# Patient Record
Sex: Female | Born: 1935 | Race: Black or African American | Hispanic: No | Smoking: Never smoker
Health system: Southern US, Community
[De-identification: ages and names within clinical notes are randomized; demographics above are authoritative.]

## PROBLEM LIST (undated history)

## (undated) DIAGNOSIS — D649 Anemia, unspecified: Secondary | ICD-10-CM

## (undated) DIAGNOSIS — E785 Hyperlipidemia, unspecified: Secondary | ICD-10-CM

## (undated) DIAGNOSIS — K449 Diaphragmatic hernia without obstruction or gangrene: Secondary | ICD-10-CM

## (undated) DIAGNOSIS — D3A092 Benign carcinoid tumor of the stomach: Secondary | ICD-10-CM

## (undated) DIAGNOSIS — K5790 Diverticulosis of intestine, part unspecified, without perforation or abscess without bleeding: Secondary | ICD-10-CM

## (undated) DIAGNOSIS — D510 Vitamin B12 deficiency anemia due to intrinsic factor deficiency: Secondary | ICD-10-CM

## (undated) DIAGNOSIS — S62102A Fracture of unspecified carpal bone, left wrist, initial encounter for closed fracture: Secondary | ICD-10-CM

## (undated) DIAGNOSIS — R413 Other amnesia: Secondary | ICD-10-CM

## (undated) DIAGNOSIS — K219 Gastro-esophageal reflux disease without esophagitis: Secondary | ICD-10-CM

## (undated) DIAGNOSIS — I10 Essential (primary) hypertension: Secondary | ICD-10-CM

## (undated) DIAGNOSIS — IMO0002 Reserved for concepts with insufficient information to code with codable children: Secondary | ICD-10-CM

## (undated) HISTORY — DX: Anemia, unspecified: D64.9

## (undated) HISTORY — DX: Essential (primary) hypertension: I10

## (undated) HISTORY — PX: EUS: SHX5427

## (undated) HISTORY — PX: UPPER GASTROINTESTINAL ENDOSCOPY: SHX188

## (undated) HISTORY — PX: COLONOSCOPY: SHX174

## (undated) HISTORY — DX: Diverticulosis of intestine, part unspecified, without perforation or abscess without bleeding: K57.90

## (undated) HISTORY — DX: Gastro-esophageal reflux disease without esophagitis: K21.9

## (undated) HISTORY — DX: Reserved for concepts with insufficient information to code with codable children: IMO0002

## (undated) HISTORY — DX: Other amnesia: R41.3

## (undated) HISTORY — DX: Benign carcinoid tumor of the stomach: D3A.092

## (undated) HISTORY — DX: Diaphragmatic hernia without obstruction or gangrene: K44.9

## (undated) HISTORY — DX: Hyperlipidemia, unspecified: E78.5

## (undated) HISTORY — DX: Fracture of unspecified carpal bone, left wrist, initial encounter for closed fracture: S62.102A

## (undated) HISTORY — DX: Vitamin B12 deficiency anemia due to intrinsic factor deficiency: D51.0

---

## 2000-07-14 ENCOUNTER — Ambulatory Visit (HOSPITAL_COMMUNITY): Admission: RE | Admit: 2000-07-14 | Discharge: 2000-07-14 | Payer: Self-pay | Admitting: Family Medicine

## 2000-07-14 ENCOUNTER — Encounter: Payer: Self-pay | Admitting: Family Medicine

## 2000-07-23 ENCOUNTER — Other Ambulatory Visit: Admission: RE | Admit: 2000-07-23 | Discharge: 2000-07-23 | Payer: Self-pay | Admitting: Family Medicine

## 2000-08-01 ENCOUNTER — Other Ambulatory Visit: Admission: RE | Admit: 2000-08-01 | Discharge: 2000-08-01 | Payer: Self-pay | Admitting: Obstetrics and Gynecology

## 2001-06-26 ENCOUNTER — Ambulatory Visit (HOSPITAL_COMMUNITY): Admission: RE | Admit: 2001-06-26 | Discharge: 2001-06-26 | Payer: Self-pay | Admitting: Family Medicine

## 2001-07-14 ENCOUNTER — Encounter: Payer: Self-pay | Admitting: Family Medicine

## 2001-07-14 ENCOUNTER — Ambulatory Visit (HOSPITAL_COMMUNITY): Admission: RE | Admit: 2001-07-14 | Discharge: 2001-07-14 | Payer: Self-pay | Admitting: Family Medicine

## 2001-11-18 ENCOUNTER — Encounter: Payer: Self-pay | Admitting: *Deleted

## 2001-11-18 ENCOUNTER — Emergency Department (HOSPITAL_COMMUNITY): Admission: EM | Admit: 2001-11-18 | Discharge: 2001-11-18 | Payer: Self-pay | Admitting: *Deleted

## 2001-11-25 ENCOUNTER — Ambulatory Visit (HOSPITAL_COMMUNITY): Admission: RE | Admit: 2001-11-25 | Discharge: 2001-11-25 | Payer: Self-pay | Admitting: General Surgery

## 2001-12-09 ENCOUNTER — Ambulatory Visit (HOSPITAL_COMMUNITY): Admission: RE | Admit: 2001-12-09 | Discharge: 2001-12-10 | Payer: Self-pay | Admitting: Family Medicine

## 2002-07-15 ENCOUNTER — Encounter: Payer: Self-pay | Admitting: Family Medicine

## 2002-07-15 ENCOUNTER — Ambulatory Visit (HOSPITAL_COMMUNITY): Admission: RE | Admit: 2002-07-15 | Discharge: 2002-07-15 | Payer: Self-pay | Admitting: Family Medicine

## 2002-07-27 ENCOUNTER — Ambulatory Visit (HOSPITAL_COMMUNITY): Admission: RE | Admit: 2002-07-27 | Discharge: 2002-07-28 | Payer: Self-pay | Admitting: Family Medicine

## 2002-09-19 ENCOUNTER — Encounter: Payer: Self-pay | Admitting: Emergency Medicine

## 2002-09-19 ENCOUNTER — Emergency Department (HOSPITAL_COMMUNITY): Admission: EM | Admit: 2002-09-19 | Discharge: 2002-09-19 | Payer: Self-pay | Admitting: Emergency Medicine

## 2003-07-21 ENCOUNTER — Ambulatory Visit (HOSPITAL_COMMUNITY): Admission: RE | Admit: 2003-07-21 | Discharge: 2003-07-21 | Payer: Self-pay | Admitting: Family Medicine

## 2004-02-13 ENCOUNTER — Emergency Department (HOSPITAL_COMMUNITY): Admission: EM | Admit: 2004-02-13 | Discharge: 2004-02-13 | Payer: Self-pay | Admitting: Emergency Medicine

## 2004-02-27 ENCOUNTER — Ambulatory Visit (HOSPITAL_COMMUNITY): Admission: RE | Admit: 2004-02-27 | Discharge: 2004-02-28 | Payer: Self-pay | Admitting: Family Medicine

## 2004-03-01 ENCOUNTER — Ambulatory Visit: Payer: Self-pay | Admitting: *Deleted

## 2004-07-23 ENCOUNTER — Ambulatory Visit (HOSPITAL_COMMUNITY): Admission: RE | Admit: 2004-07-23 | Discharge: 2004-07-23 | Payer: Self-pay | Admitting: Family Medicine

## 2005-07-29 ENCOUNTER — Ambulatory Visit (HOSPITAL_COMMUNITY): Admission: RE | Admit: 2005-07-29 | Discharge: 2005-07-29 | Payer: Self-pay | Admitting: Family Medicine

## 2006-01-21 DIAGNOSIS — D3A092 Benign carcinoid tumor of the stomach: Secondary | ICD-10-CM

## 2006-01-21 DIAGNOSIS — S62102A Fracture of unspecified carpal bone, left wrist, initial encounter for closed fracture: Secondary | ICD-10-CM

## 2006-01-21 HISTORY — DX: Benign carcinoid tumor of the stomach: D3A.092

## 2006-01-21 HISTORY — DX: Fracture of unspecified carpal bone, left wrist, initial encounter for closed fracture: S62.102A

## 2006-04-30 ENCOUNTER — Inpatient Hospital Stay (HOSPITAL_COMMUNITY): Admission: EM | Admit: 2006-04-30 | Discharge: 2006-05-04 | Payer: Self-pay | Admitting: Emergency Medicine

## 2006-05-01 ENCOUNTER — Ambulatory Visit: Payer: Self-pay | Admitting: Gastroenterology

## 2006-05-02 ENCOUNTER — Encounter (INDEPENDENT_AMBULATORY_CARE_PROVIDER_SITE_OTHER): Payer: Self-pay | Admitting: Specialist

## 2006-05-06 ENCOUNTER — Ambulatory Visit: Payer: Self-pay | Admitting: Internal Medicine

## 2006-05-21 ENCOUNTER — Ambulatory Visit (HOSPITAL_COMMUNITY): Admission: RE | Admit: 2006-05-21 | Discharge: 2006-05-21 | Payer: Self-pay | Admitting: Gastroenterology

## 2006-05-21 ENCOUNTER — Ambulatory Visit: Payer: Self-pay | Admitting: Gastroenterology

## 2006-05-21 ENCOUNTER — Encounter (INDEPENDENT_AMBULATORY_CARE_PROVIDER_SITE_OTHER): Payer: Self-pay | Admitting: Specialist

## 2006-07-05 ENCOUNTER — Emergency Department (HOSPITAL_COMMUNITY): Admission: EM | Admit: 2006-07-05 | Discharge: 2006-07-05 | Payer: Self-pay | Admitting: Emergency Medicine

## 2006-07-09 ENCOUNTER — Ambulatory Visit: Payer: Self-pay | Admitting: Orthopedic Surgery

## 2006-08-11 ENCOUNTER — Ambulatory Visit (HOSPITAL_COMMUNITY): Admission: RE | Admit: 2006-08-11 | Discharge: 2006-08-11 | Payer: Self-pay | Admitting: Family Medicine

## 2006-08-13 ENCOUNTER — Ambulatory Visit (HOSPITAL_COMMUNITY): Payer: Self-pay | Admitting: Oncology

## 2006-08-20 ENCOUNTER — Ambulatory Visit (HOSPITAL_COMMUNITY): Admission: RE | Admit: 2006-08-20 | Discharge: 2006-08-20 | Payer: Self-pay | Admitting: Family Medicine

## 2006-08-21 ENCOUNTER — Ambulatory Visit (HOSPITAL_COMMUNITY): Admission: RE | Admit: 2006-08-21 | Discharge: 2006-08-21 | Payer: Self-pay | Admitting: Gastroenterology

## 2006-08-21 ENCOUNTER — Encounter: Payer: Self-pay | Admitting: Gastroenterology

## 2006-08-22 ENCOUNTER — Encounter (INDEPENDENT_AMBULATORY_CARE_PROVIDER_SITE_OTHER): Payer: Self-pay | Admitting: Radiology

## 2006-08-22 ENCOUNTER — Encounter: Admission: RE | Admit: 2006-08-22 | Discharge: 2006-08-22 | Payer: Self-pay | Admitting: Family Medicine

## 2006-08-25 ENCOUNTER — Ambulatory Visit: Payer: Self-pay | Admitting: Gastroenterology

## 2006-08-27 ENCOUNTER — Ambulatory Visit: Payer: Self-pay | Admitting: Orthopedic Surgery

## 2006-09-04 ENCOUNTER — Encounter (INDEPENDENT_AMBULATORY_CARE_PROVIDER_SITE_OTHER): Payer: Self-pay | Admitting: Radiology

## 2006-09-04 ENCOUNTER — Encounter: Admission: RE | Admit: 2006-09-04 | Discharge: 2006-09-04 | Payer: Self-pay | Admitting: Family Medicine

## 2006-10-13 ENCOUNTER — Emergency Department (HOSPITAL_COMMUNITY): Admission: EM | Admit: 2006-10-13 | Discharge: 2006-10-13 | Payer: Self-pay | Admitting: Emergency Medicine

## 2006-10-20 ENCOUNTER — Ambulatory Visit (HOSPITAL_COMMUNITY): Admission: RE | Admit: 2006-10-20 | Discharge: 2006-10-21 | Payer: Self-pay | Admitting: Family Medicine

## 2006-10-24 ENCOUNTER — Ambulatory Visit: Payer: Self-pay | Admitting: Cardiology

## 2006-10-24 ENCOUNTER — Encounter (HOSPITAL_COMMUNITY): Admission: RE | Admit: 2006-10-24 | Discharge: 2006-11-23 | Payer: Self-pay | Admitting: Oncology

## 2006-10-24 ENCOUNTER — Ambulatory Visit (HOSPITAL_COMMUNITY): Payer: Self-pay | Admitting: Oncology

## 2006-12-26 ENCOUNTER — Ambulatory Visit (HOSPITAL_COMMUNITY): Payer: Self-pay | Admitting: Oncology

## 2007-01-22 HISTORY — PX: EUS: SHX5427

## 2007-01-23 ENCOUNTER — Encounter (HOSPITAL_COMMUNITY): Admission: RE | Admit: 2007-01-23 | Discharge: 2007-02-22 | Payer: Self-pay | Admitting: Oncology

## 2007-02-11 ENCOUNTER — Ambulatory Visit (HOSPITAL_COMMUNITY): Payer: Self-pay | Admitting: Oncology

## 2007-02-19 ENCOUNTER — Encounter: Payer: Self-pay | Admitting: Gastroenterology

## 2007-02-19 ENCOUNTER — Ambulatory Visit (HOSPITAL_COMMUNITY): Admission: RE | Admit: 2007-02-19 | Discharge: 2007-02-19 | Payer: Self-pay | Admitting: Gastroenterology

## 2007-02-25 ENCOUNTER — Ambulatory Visit: Payer: Self-pay | Admitting: Gastroenterology

## 2007-04-01 ENCOUNTER — Ambulatory Visit (HOSPITAL_COMMUNITY): Payer: Self-pay | Admitting: Oncology

## 2007-05-28 ENCOUNTER — Ambulatory Visit (HOSPITAL_COMMUNITY): Payer: Self-pay | Admitting: Oncology

## 2007-07-31 ENCOUNTER — Encounter (HOSPITAL_COMMUNITY): Admission: RE | Admit: 2007-07-31 | Discharge: 2007-08-30 | Payer: Self-pay | Admitting: Oncology

## 2007-07-31 ENCOUNTER — Ambulatory Visit (HOSPITAL_COMMUNITY): Payer: Self-pay | Admitting: Oncology

## 2007-08-24 ENCOUNTER — Ambulatory Visit (HOSPITAL_COMMUNITY): Admission: RE | Admit: 2007-08-24 | Discharge: 2007-08-24 | Payer: Self-pay | Admitting: Family Medicine

## 2007-08-28 ENCOUNTER — Other Ambulatory Visit: Admission: RE | Admit: 2007-08-28 | Discharge: 2007-08-28 | Payer: Self-pay | Admitting: Family Medicine

## 2007-09-09 ENCOUNTER — Other Ambulatory Visit: Admission: RE | Admit: 2007-09-09 | Discharge: 2007-09-09 | Payer: Self-pay | Admitting: Obstetrics and Gynecology

## 2007-09-29 ENCOUNTER — Ambulatory Visit (HOSPITAL_COMMUNITY): Payer: Self-pay | Admitting: Oncology

## 2007-11-27 ENCOUNTER — Ambulatory Visit (HOSPITAL_COMMUNITY): Payer: Self-pay | Admitting: Oncology

## 2008-01-29 ENCOUNTER — Encounter (HOSPITAL_COMMUNITY): Admission: RE | Admit: 2008-01-29 | Discharge: 2008-02-28 | Payer: Self-pay | Admitting: Oncology

## 2008-01-29 ENCOUNTER — Ambulatory Visit (HOSPITAL_COMMUNITY): Payer: Self-pay | Admitting: Oncology

## 2008-04-01 ENCOUNTER — Ambulatory Visit (HOSPITAL_COMMUNITY): Payer: Self-pay | Admitting: Oncology

## 2008-05-27 ENCOUNTER — Ambulatory Visit (HOSPITAL_COMMUNITY): Payer: Self-pay | Admitting: Oncology

## 2008-07-22 ENCOUNTER — Encounter (HOSPITAL_COMMUNITY): Admission: RE | Admit: 2008-07-22 | Discharge: 2008-08-21 | Payer: Self-pay | Admitting: Oncology

## 2008-07-22 ENCOUNTER — Ambulatory Visit (HOSPITAL_COMMUNITY): Payer: Self-pay | Admitting: Oncology

## 2008-08-26 ENCOUNTER — Ambulatory Visit (HOSPITAL_COMMUNITY): Admission: RE | Admit: 2008-08-26 | Discharge: 2008-08-26 | Payer: Self-pay | Admitting: Family Medicine

## 2008-09-23 ENCOUNTER — Ambulatory Visit (HOSPITAL_COMMUNITY): Payer: Self-pay | Admitting: Oncology

## 2008-11-18 ENCOUNTER — Ambulatory Visit (HOSPITAL_COMMUNITY): Payer: Self-pay | Admitting: Oncology

## 2009-01-11 ENCOUNTER — Ambulatory Visit (HOSPITAL_COMMUNITY): Payer: Self-pay | Admitting: Oncology

## 2009-02-17 ENCOUNTER — Telehealth: Payer: Self-pay | Admitting: Gastroenterology

## 2009-02-17 ENCOUNTER — Encounter (INDEPENDENT_AMBULATORY_CARE_PROVIDER_SITE_OTHER): Payer: Self-pay | Admitting: *Deleted

## 2009-02-21 ENCOUNTER — Encounter: Payer: Self-pay | Admitting: Gastroenterology

## 2009-02-22 ENCOUNTER — Ambulatory Visit: Payer: Self-pay | Admitting: Gastroenterology

## 2009-02-22 ENCOUNTER — Ambulatory Visit (HOSPITAL_COMMUNITY): Admission: RE | Admit: 2009-02-22 | Discharge: 2009-02-22 | Payer: Self-pay | Admitting: Gastroenterology

## 2009-03-02 ENCOUNTER — Encounter: Payer: Self-pay | Admitting: Gastroenterology

## 2009-03-07 ENCOUNTER — Encounter (INDEPENDENT_AMBULATORY_CARE_PROVIDER_SITE_OTHER): Payer: Self-pay

## 2009-03-07 ENCOUNTER — Ambulatory Visit (HOSPITAL_COMMUNITY): Payer: Self-pay | Admitting: Oncology

## 2009-03-21 ENCOUNTER — Encounter: Payer: Self-pay | Admitting: Gastroenterology

## 2009-04-03 DIAGNOSIS — E119 Type 2 diabetes mellitus without complications: Secondary | ICD-10-CM | POA: Insufficient documentation

## 2009-04-03 DIAGNOSIS — R5383 Other fatigue: Secondary | ICD-10-CM

## 2009-04-03 DIAGNOSIS — K921 Melena: Secondary | ICD-10-CM | POA: Insufficient documentation

## 2009-04-03 DIAGNOSIS — E785 Hyperlipidemia, unspecified: Secondary | ICD-10-CM

## 2009-04-03 DIAGNOSIS — R5381 Other malaise: Secondary | ICD-10-CM

## 2009-04-03 DIAGNOSIS — E782 Mixed hyperlipidemia: Secondary | ICD-10-CM | POA: Insufficient documentation

## 2009-04-03 DIAGNOSIS — K219 Gastro-esophageal reflux disease without esophagitis: Secondary | ICD-10-CM

## 2009-04-03 DIAGNOSIS — I10 Essential (primary) hypertension: Secondary | ICD-10-CM | POA: Insufficient documentation

## 2009-04-12 ENCOUNTER — Ambulatory Visit (HOSPITAL_COMMUNITY): Admission: RE | Admit: 2009-04-12 | Discharge: 2009-04-12 | Payer: Self-pay | Admitting: Gastroenterology

## 2009-05-02 ENCOUNTER — Ambulatory Visit (HOSPITAL_COMMUNITY): Payer: Self-pay | Admitting: Oncology

## 2009-06-26 ENCOUNTER — Ambulatory Visit (HOSPITAL_COMMUNITY): Payer: Self-pay | Admitting: Oncology

## 2009-07-27 ENCOUNTER — Encounter (HOSPITAL_COMMUNITY): Admission: RE | Admit: 2009-07-27 | Discharge: 2009-08-26 | Payer: Self-pay | Admitting: Oncology

## 2009-08-24 ENCOUNTER — Ambulatory Visit (HOSPITAL_COMMUNITY): Payer: Self-pay | Admitting: Oncology

## 2009-08-28 ENCOUNTER — Ambulatory Visit (HOSPITAL_COMMUNITY): Admission: RE | Admit: 2009-08-28 | Discharge: 2009-08-28 | Payer: Self-pay | Admitting: Family Medicine

## 2009-09-28 ENCOUNTER — Ambulatory Visit: Payer: Self-pay | Admitting: Gastroenterology

## 2009-09-28 DIAGNOSIS — D3A092 Benign carcinoid tumor of the stomach: Secondary | ICD-10-CM

## 2009-10-27 ENCOUNTER — Ambulatory Visit (HOSPITAL_COMMUNITY): Payer: Self-pay | Admitting: Oncology

## 2009-12-01 ENCOUNTER — Encounter (HOSPITAL_COMMUNITY)
Admission: RE | Admit: 2009-12-01 | Discharge: 2009-12-31 | Payer: Self-pay | Source: Home / Self Care | Attending: Oncology | Admitting: Oncology

## 2009-12-29 ENCOUNTER — Ambulatory Visit (HOSPITAL_COMMUNITY): Payer: Self-pay | Admitting: Oncology

## 2010-02-02 ENCOUNTER — Encounter (HOSPITAL_COMMUNITY)
Admission: RE | Admit: 2010-02-02 | Discharge: 2010-02-20 | Payer: Self-pay | Source: Home / Self Care | Attending: Oncology | Admitting: Oncology

## 2010-02-05 LAB — CBC
HCT: 41.1 % (ref 36.0–46.0)
Hemoglobin: 13.2 g/dL (ref 12.0–15.0)
MCH: 31.4 pg (ref 26.0–34.0)
MCHC: 32.1 g/dL (ref 30.0–36.0)
MCV: 97.6 fL (ref 78.0–100.0)
Platelets: 165 10*3/uL (ref 150–400)
RBC: 4.21 MIL/uL (ref 3.87–5.11)
RDW: 12.6 % (ref 11.5–15.5)
WBC: 7.7 10*3/uL (ref 4.0–10.5)

## 2010-02-11 ENCOUNTER — Encounter: Payer: Self-pay | Admitting: Family Medicine

## 2010-02-16 ENCOUNTER — Ambulatory Visit (HOSPITAL_COMMUNITY): Admit: 2010-02-16 | Discharge: 2010-02-20 | Payer: Self-pay | Attending: Oncology | Admitting: Oncology

## 2010-02-20 NOTE — Letter (Signed)
Summary: Plan of Care, Need to Discuss  [facility]  [address]   [place], [place] [address]   Phone: [phone]  Fax: [phone]    March 07, 2009  [name] [address] [place], [place]  [address] 12-27-1922   Dear [name],   We are writing this letter to inform you of treatment plans and/or discuss your plan of care.  We have tried several times to contact you; however, we have yet to reach you.  We ask that you please contact our office for follow-up on your gastrointestinal issues.  We can  be reached at [phone] to schedule an appointment, or to speak with someone regarding your health care needs.  Please do not neglect your health.   Sincerely,    Hendricks Limes LPN  [facility] Ph: [phone]    Fax: [phone]   Appended Document: Plan of Care, Need to Discuss OPV in 4 mos, RE: anemia, gastric carcinoid.  Appended Document: Plan of Care, Need to Discuss Spoke with Dr. Mariel Sleet. Gastric carcinoid definite source for FEDA. Lesions small but multiple. Continue IV Fe as needed.

## 2010-02-20 NOTE — Letter (Signed)
Summary: [facility]   Imported By: Diana Eves 03/02/2009 11:39:36  _____________________________________________________________________  External Attachment:    Type:   Image     Comment:   External Document  Appended Document: [facility] JAN 2011

## 2010-02-20 NOTE — Letter (Signed)
Summary: [facility] ORDER  [facility] ORDER   Imported By: Ave Filter 03/21/2009 15:31:35  _____________________________________________________________________  External Attachment:    Type:   Image     Comment:   External Document

## 2010-02-20 NOTE — Letter (Signed)
Summary: Appointment Reminder  [facility]  [address]   [place], [place] [address]   Phone: [phone]  Fax: [phone]       February 17, 2009   [name] [address] [place], [place]  [address] Jun 06, 1922    Dear Ms. Funez,  We have been unable to reach you by phone to schedule a follow up   appointment that was recommended for you by Dr. Darrick Penna. It is very   important that we reach you to schedule an appointment. We hope that you  allow [place] to participate in your health care needs. Please contact us at  [phone] at your earliest convenience to schedule your appointment.  Sincerely,    [facility] R. Roetta Sessions, M.D.    Kassie Mends, M.D. Lorenza Burton, FNP-BC    Tana Coast, PA-C Phone: [phone]    Fax: [phone]

## 2010-02-20 NOTE — Assessment & Plan Note (Signed)
Summary: GASTRIC CARCINOID   Visit Type:  Follow-up Visit Primary Care Provider:  Loleta Chance, M.D.  Chief Complaint:  anemia/gastric carcinoid.  History of Present Illness: Eats all the time. Bms: every day. No pain in belly pain, nausea, or vomiting. Yesterday went to pizza and had to chew some TUMs and it helped.   Current Medications (verified): 1)  Oscal 500/200 D-3 500-200 Mg-Unit Tabs (Calcium-Vitamin D) .... Two Times A Day 2)  Simvastatin 40 Mg Tabs (Simvastatin) .... At Bedtime 3)  Felodipine 5 Mg Xr24h-Tab (Felodipine) .... Once Daily 4)  Famotidine 20 Mg Tabs (Famotidine) .... Two Times A Day 5)  Complete Allergy 25 Mg Caps (Diphenhydramine Hcl) .... Once Daily 6)  Iron Injections .... Q Month  Allergies (verified): 1)  ! 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, causing Anemia, IVFE qmo **EUS MAR 2011, EGD FEB 2011 PERNICIOUS ANEMIA HIATAL HERNIA TCS 2003 LS-DC/[place] DIVERTICULOSIS Hyperlipidemia Hypertension  Family History: Family History of Diabetes  Social History: Widowed One son: [place] Tobacco/Alcohol Use - no  Review of Systems       2008: 125 LBS  JULY 2011: HB 13.3 PLT 134   Vital Signs:  Patient profile:   77 year old female Height:      62 inches Weight:      134 pounds BMI:     24.60 Temp:     97.8 degrees F oral Pulse rate:   64 / minute BP sitting:   130 / 80  (left arm) Cuff size:   regular  Vitals Entered By: Hendricks Limes LPN (September 28, 2009 9:59 AM)  Physical Exam  General:  Well developed, well nourished, no acute distress. Head:  Normocephalic and atraumatic. Lungs:  Clear throughout to auscultation. Heart:  Regular rate and rhythm; no murmurs. Abdomen:  Soft, nontender  and nondistended. Normal bowel sounds. Neurologic:  Alert and  oriented x4;  grossly normal neurologically.  Impression & Recommendations:  Problem # 1:  BENIGN CARCINOID TUMOR OF THE STOMACH (ICD-209.63) Assessment Unchanged Pt able to maintain her HB. Plan repeat EGD in 1-2 years. OPV in 6 mos. Continue IVFE.  CC: PCP and Dr. Mariel Sleet  Appended Document: GASTRIC CARCINOID 6 MONTH F/U OPV IS IN THE COMPUTER  Appended Document: Orders Update    Clinical Lists Changes  Orders: Added new Service order of Est. Patient Level II ([address]) - Signed

## 2010-02-20 NOTE — Letter (Signed)
Summary: triage EGD  triage EGD   Imported By: Diana Eves 02/21/2009 10:06:46  _____________________________________________________________________  External Attachment:    Type:   Image     Comment:   External Document

## 2010-02-20 NOTE — Progress Notes (Signed)
Summary: FOLLOW UP EGD  Called By Dr. Mariel Sleet. Pt needs EGD, Dx: Anemia/?carcinoid on Bx in 2008. Schedule next W/Th/F. [place] Bali MD  February 17, 2009 1:02 PM  Appended Document: FOLLOW UP EGD I called pt, no answer.Marland KitchenMarland KitchenI will try again on Monday, 02/20/09.  Appended Document: FOLLOW UP EGD Pt scheduled for EGD on 02/22/09@10 :00am  Appended Document: FOLLOW UP EGD Triage form reviewed. No med adjustment.

## 2010-03-02 ENCOUNTER — Ambulatory Visit (HOSPITAL_COMMUNITY): Payer: Medicare Other

## 2010-03-02 DIAGNOSIS — D510 Vitamin B12 deficiency anemia due to intrinsic factor deficiency: Secondary | ICD-10-CM

## 2010-03-13 ENCOUNTER — Encounter (INDEPENDENT_AMBULATORY_CARE_PROVIDER_SITE_OTHER): Payer: Self-pay | Admitting: *Deleted

## 2010-03-20 NOTE — Letter (Signed)
Summary: Recall Office Visit  [facility]  [address]   [place], [place] [address]   Phone: [phone]  Fax: [phone]      March 13, 2010   [name] [address] [place], [place]  [address] 1922/12/31   Dear Ms. Poblete,   According to our records, it is time for you to schedule a follow-up office visit with [place].   At your convenience, please call [phone] to schedule an office visit. If you have any questions, concerns, or feel that this letter is in error, we would appreciate your call.   Sincerely,    Diana Eves  [facility] Ph: [phone]   Fax: [phone]

## 2010-03-30 ENCOUNTER — Encounter (HOSPITAL_COMMUNITY): Payer: Medicare Other | Attending: Oncology

## 2010-03-30 DIAGNOSIS — D510 Vitamin B12 deficiency anemia due to intrinsic factor deficiency: Secondary | ICD-10-CM

## 2010-04-08 LAB — CBC
HCT: 41.4 % (ref 36.0–46.0)
Hemoglobin: 13.3 g/dL (ref 12.0–15.0)
RBC: 4.2 MIL/uL (ref 3.87–5.11)
RDW: 13.1 % (ref 11.5–15.5)
WBC: 7 10*3/uL (ref 4.0–10.5)

## 2010-04-26 ENCOUNTER — Encounter (HOSPITAL_COMMUNITY): Payer: Medicare Other | Attending: Oncology

## 2010-04-26 DIAGNOSIS — E538 Deficiency of other specified B group vitamins: Secondary | ICD-10-CM

## 2010-04-27 ENCOUNTER — Encounter (HOSPITAL_COMMUNITY): Payer: Medicare Other

## 2010-04-29 LAB — CBC
HCT: 37.5 % (ref 36.0–46.0)
Hemoglobin: 12.8 g/dL (ref 12.0–15.0)
MCHC: 34 g/dL (ref 30.0–36.0)
MCV: 97.7 fL (ref 78.0–100.0)
Platelets: 138 K/uL — ABNORMAL LOW (ref 150–400)
RBC: 3.84 MIL/uL — ABNORMAL LOW (ref 3.87–5.11)
RDW: 13.3 % (ref 11.5–15.5)
WBC: 6.6 K/uL (ref 4.0–10.5)

## 2010-05-07 LAB — CBC
MCHC: 32.5 g/dL (ref 30.0–36.0)
MCV: 97.8 fL (ref 78.0–100.0)
RBC: 4.3 MIL/uL (ref 3.87–5.11)

## 2010-05-07 LAB — DIFFERENTIAL
Basophils Relative: 1 % (ref 0–1)
Eosinophils Absolute: 0.1 10*3/uL (ref 0.0–0.7)
Monocytes Relative: 8 % (ref 3–12)
Neutrophils Relative %: 66 % (ref 43–77)

## 2010-05-24 ENCOUNTER — Encounter (HOSPITAL_COMMUNITY): Payer: Medicare Other | Attending: Oncology

## 2010-05-24 DIAGNOSIS — D510 Vitamin B12 deficiency anemia due to intrinsic factor deficiency: Secondary | ICD-10-CM

## 2010-05-25 ENCOUNTER — Encounter (HOSPITAL_COMMUNITY): Payer: Medicare Other

## 2010-05-28 ENCOUNTER — Encounter: Payer: Self-pay | Admitting: Gastroenterology

## 2010-06-06 ENCOUNTER — Ambulatory Visit: Payer: Medicare Other | Admitting: Gastroenterology

## 2010-06-08 NOTE — Op Note (Signed)
NAMEWILHELMINE, James                ACCOUNT NO.:  [id number]   MEDICAL RECORD NO.:  [id number]          PATIENT TYPE:  INP   LOCATION:  A303                          FACILITY:  [facility]   PHYSICIAN:  Kassie Mends, M.D.      DATE OF BIRTH:  Oct 30, 1922   DATE OF PROCEDURE:  05/02/2006  DATE OF DISCHARGE:                                PROCEDURE NOTE   REFERRING PHYSICIAN:  Skeet Latch, DO.   PROCEDURE:  Esophagogastroduodenoscopy with cold forceps biopsy.   INDICATIONS FOR EXAMINATION:  [name] is an 76 year old female with  pernicious anemia.  She presents with anorexia and weight loss.   FINDINGS:  Pedunculated gastric polyp seen in the mid body of the  stomach.  She also had small gastric polyps in the surrounding area.  Cold forceps biopsies used to obtain sample.  Otherwise, sigmoid  esophagus without evidence of Barrett's, erosions, or ulcerations.  Normal duodenal bulb and second portion of the duodenum.   RECOMMENDATIONS:  1. We will call [name] with biopsies.  2. The pedunculated polyp will need to be removed. She may need      endoscopic ultrasound for removal.  Discuss the endoscopic results      with Dr. Lilian Kapur and Beverely Risen.  3. Advance to a low-fat diet.  4. Should have CBC in the a.m.  She did have active oozing following      the biopsy.   MEDICATIONS:  1. Demerol 25 mg IV.  2. Versed 2 mg IV.   PROCEDURAL TECHNIQUE:  Physical exam was performed and informed consent  was obtained from the patient after explaining the benefits, risks, and  alternatives to the procedure.  The patient was connected to the monitor  and placed in a left lateral position.  Continuous oxygen provided by  nasal cannula and IV medicine administered through an in-dwelling  cannula.  After administration of sedation, the scope was advanced under  direct visualization  to the second portion of the duodenum.  The scope was removed slowed by  carefully examining the color, texture,  anatomy, and integrity of the  mucosa on the way out.  The patient was recovered from endoscopy and  discharged to the floor in satisfactory condition.      Kassie Mends, M.D.  Electronically Signed     SM/MEDQ  D:  05/02/2006  T:  05/02/2006  Job:  [id number]   cc:   Annia Friendly. Loleta Chance, MD  Fax: [phone]

## 2010-06-08 NOTE — H&P (Signed)
[name], [name]                ACCOUNT NO.:  [id number]   MEDICAL RECORD NO.:  [id number]          PATIENT TYPE:  INP   LOCATION:  A303                          [facility]   PHYSICIAN:  Skeet Latch, DO    DATE OF BIRTH:  1922-02-26   DATE OF ADMISSION:  04/30/2006  DATE OF DISCHARGE:  LH                              HISTORY & PHYSICAL   PRIMARY CARE PHYSICIAN:  Dr. Loleta Chance.   CHIEF COMPLAINT:  Weakness.   HISTORY OF PRESENT ILLNESS:  This is an 77 year old African-American  female who presents with a complaint of weakness.  Apparently, patient  states that for the past two days, she has had profound diarrhea,  emesis, and nausea.  The patient states that she has not eaten in the  last two days and has not been interested in any food.  The patient  states that this a.m. while driving back home, she had a sudden onset of  blurry vision.  The patient denies any lightheadedness or dizziness and  states that the blurry vision lasted briefly and then she regained her  normal sight.  Patient states that she does have a history of this at  times, and this is not an isolated event.   Patient is comfortable.  Has no complaints.  No dizziness,  lightheadedness, chest pain, abdominal pain, nausea or vomiting.   Patient is seen in the emergency room and had a CAT scan of her head  performed which was negative.  Also had an acute abdominal series which  showed a nonspecific bowel gas pattern.  No evidence of bowel  dilatation, obstruction, or free air.  This showed old calcified  fibroids in the pelvis and a questionable paraspinal calcification at  L4, cannot exclude a right ureteral stone.  Overall showed a large  hiatal hernia, cardiomegaly, bronchiectic changes, and benign bowel gas  patterns.   The patient's past medical history is positive for hypertension,  hyperlipidemia, gastroesophageal reflux disease.   Family history is positive for diabetes.   PAST SURGICAL  HISTORY:  None.   SOCIAL HISTORY:  Denies any smoking, alcohol abuse, or any illicit drug  abuse.  Patient lives alone.  She is retired and widowed.   ALLERGIES:  CODEINE.   HOME MEDICATIONS:  I do not have a list of her home medications  available at this time.   REVIEW OF SYSTEMS:  HEENT:  Denies any diplopia, eye pain, or blurry  vision.  Denies any neck pain, ear pain, or hearing problems.  CARDIOVASCULAR:  Denies any chest pain, palpitations.  RESPIRATORY:  Denies any cough, dyspnea, or wheezing.  GI:  Slight nausea.  Denies any  current vomiting.  Positive for some diarrhea.  Denies any abdominal  pain, hematemesis, melena, or rectal bleeding.  GENITOURINARY:  Denies  any dysuria, urgency, flank pain.  MUSCULOSKELETAL:  Denies any back  pain, myalgias.  Other systems negative at this time.   PHYSICAL EXAMINATION:  VITAL SIGNS:  Temperature 98.4, respirations 20,  pulse 89, blood pressure 116/56.  GENERAL:  Patient is well-developed and well-hydrated in no acute  distress.  Pleasant.  HEENT:  Head is normocephalic and atraumatic.  PERRLA.  EOMI.  No  scleral icterus.  NECK:  Soft and supple.  No JVD.  No thyromegaly appreciated.  CARDIOVASCULAR:  Regular rate and rhythm without murmurs, rubs or  gallops.  Slight distant heart sounds.  RESPIRATORY:  Lungs are clear to auscultation bilaterally.  No rales,  rhonchi or wheezes.  ABDOMEN:  Soft, nontender, nondistended.  Positive bowel sounds.  No  masses appreciated.  No rigidity or guarding.  EXTREMITIES:  No clubbing, cyanosis or erythema.  NEUROLOGIC:  Cranial nerves II-XII are grossly intact.  Patient is alert  and oriented x3.   LABS:  White blood cell count 6.5, hemoglobin 12.8, hematocrit 37.7.  MCV was 122.4, platelet count 186, neutrophils 81, lymphocytes 11,  lipase 18, amylase 129, sodium 139, potassium 3.5, chloride 104, CO2 24,  glucose 147, BUN 18, creatinine 1.17, calcium 9.4.  Her urine showed  positive  nitrites, many bacteria, a few white blood cells, negative  leukocytes.   RADIOGRAPHIC STUDIES:  CT of the head was normal.  Please see HPI for  abdominal series report.   ASSESSMENT:  1. Generalized weakness.  2. Acute diarrhea.  3. Urinary tract infection.   PLAN:  We will admit patient to a general medical bed.  We will place  the patient on clear liquids at this time.  Advance as tolerated.  Patient will be IV hydrated.  Will get a.m. labs, including BNP, TSH, UA  culture and sensitivity, hemoglobin A1C.  Patient will be placed on GI  as well as DVT prophylaxis.  Also give IV antiemetics as needed.  Patient was placed on aspirin 81 mg daily.  Will get a stool culture and  sensitivity for ova and parasites as well as leukocytes and do a C. diff  toxin screen.  Will consult GI regarding the patient's diarrhea.  I  believe an ER physician has gotten a B12 and folate level.  I will await  the results of this at this time.      Skeet Latch, DO  Electronically Signed     SM/MEDQ  D:  04/30/2006  T:  04/30/2006  Job:  [id number]   cc:   Dr. Loleta Chance

## 2010-06-08 NOTE — Discharge Summary (Signed)
NAMESEMA, James                ACCOUNT NO.:  [id number]   MEDICAL RECORD NO.:  [id number]          PATIENT TYPE:  INP   LOCATION:  A303                          [facility]  [facility]   PHYSICIAN:  Margaretmary Dys, M.D.DATE OF BIRTH:  [birth year]   DATE OF ADMISSION:  04/30/2006  DATE OF DISCHARGE:  04/13/2008LH                               DISCHARGE SUMMARY   DISCHARGE DIAGNOSES:  1. Generalized weakness.  2. Acute diarrhea.  3. Urinary tract infection.  4. Hematemesis.  5. Vitamin B12 deficiency with macrocytic anemia.  6. Urinary tract infection.  7. Upper gastrointestinal bleed secondary to gastric polyp.   DISCHARGE MEDICATIONS:  1. Levaquin 500 mg p.o. daily for 5 days.  2. Os-Cal 500 mg p.o. b.i.d.  3. Lipitor 10 mg p.o. daily.  4. Lisinopril 10 mg p.o. daily.  5. Caduet 10 mg p.o. daily.  6. Levsin 0.0133 sublingual before meals.  7. Norvasc 5 mg p.o. daily.   SPECIAL INSTRUCTIONS:  The patient is to hold her aspirin until seen by  Dr. Karilyn Cota.   FOLLOW UP:  The patient is scheduled to follow up with Dr. Karilyn Cota. The  patient is to call his office. She is an established patient of Dr.  Karilyn Cota.   HOSPITAL COURSE:  [name] is an 77 year old African-American female  who presented to the hospital with generalized weakness, acute onset of  nausea, vomiting and diarrhea. Kindly review the history and physical  dictated by Dr. Lilian Kapur.   The patient apparently also had a very poor appetite. The patient may  have had some lightheadedness with blurry vision briefly on the day of  admission. The patient was evaluated in the emergency room. CAT scan of  her head was negative, and also an abdominal series was unremarkable.   Her laboratory data also was not very remarkable except for her urine  which showed positive nitrites and many bacteria and a few white blood  cells. The patient was subsequently started on IV Levaquin. She  currently improved. There was also  concern for diarrhea and hematemesis  for which we consulted with GI; the patient is known to Dr. Karilyn Cota.   The patient had an EGD which showed a large hiatal hernia and also a  gastric polyp. Pathology results were pending at the time of discharge.   Overall, the patient did fairly well, was stable when I saw her on May 04, 2006. After consult with  Dr. Karilyn Cota, he  advised discharge.   DIET:  Low-salt diet.   The patient is to avoid all nonsteroidal anti-inflammatory drugs for  now.   PERTINENT LABORATORY DATA:  During the course of hospitalization, white  blood cell count was 6.5, hemoglobin of 12.8, hematocrit 37.7. This  dropped the next day to 10.3 and 29.8 with a MCV 120. Neutrophils were  81%. Sodium was 139, potassium 3.5, chloride 104, CO2 24, glucose 147,  BUN 18, creatinine 1.17.   Hemoglobin A1c was 5.1. BUN 3.9. Liver function tests were normal.  Amylase and lipase were normal. TSH was 4.11. BNP was less than 30. Iron  levels  were low at 26. TIBC was 205 with 13% saturation. Her vitamin B12  was 156 which is low. Folic was 19.4, ferritin 112. Urinalysis was  positive for nitrites and also urine microscopy showed many bacteria  with 3 to 6 WBCs. Culture was pan sensitive. Culture grew Escherichia  coli. Clostridium difficile toxin was negative. Stool for ova and  parasites was negative. Head CT scan was negative. Twelve-lead EKG was  normal sinus rhythm with poor R-wave progression.   Chest x-ray shows large hiatal hernia, cardiomegaly, bronchitic changes.  Abdominal series with benign bowel gas pattern.   DISPOSITION:  The patient was discharged to home in satisfactory  condition.      Margaretmary Dys, M.D.  Electronically Signed     AM/MEDQ  D:  06/14/2006  T:  06/14/2006  Job:  [id number]

## 2010-06-08 NOTE — Group Therapy Note (Signed)
NAMEDEVINNE, EPSTEIN                ACCOUNT NO.:  [id number]   MEDICAL RECORD NO.:  [id number]          PATIENT TYPE:  INP   LOCATION:  A303                          FACILITY:  [facility]   PHYSICIAN:  Margaretmary Dys, M.D.DATE OF BIRTH:  Apr 29, 1922   DATE OF PROCEDURE:  05/03/2006  DATE OF DISCHARGE:                                 PROGRESS NOTE   SUBJECTIVE:  She does still feel weak but better than yesterday. She was  able to eat some of her breakfast. She denies any fevers or chills. She  has no abdominal pain. She denies any melenic stools. The patient was  found with a gastric polyp which is pending biopsy results.   OBJECTIVE:  Conscious, alert, comfortable, pleasant, not in acute  distress.  VITAL SIGNS:  Were stable.  HEENT:  Normocephalic, atraumatic. Oral mucosa was moist. No exudate.  NECK:  Supple. No JVD. No lymphadenopathy.  LUNGS:  Were clinically clear.  ABDOMEN:  Was soft, nontender, bowel sounds positive, no mass palpable.  EXTREMITIES:  No edema.   LABORATORY DATA:  White blood cell count was stable. H&H was stable from  yesterday. Sodium was 142, potassium 3.7, chloride 109, CO2 27, glucose  110, BUN 4, creatinine 0.8.   ASSESSMENT AND PLAN:  1. Generalized weakness, likely multifactorial, probably secondary to      anemia and urinary tract infection. The patient is better. We will      encourage ambulation today.  2. Nausea and vomiting with diarrhea. The patient is getting better.      She has had normal loose bowel movements. Was able to eat some of      her breakfast today.  3. Urinary tract infection. We switched her Levaquin to p.o. She has      no fevers and was hemodynamically stable.  4. Anemia. Unclear if this a result of bleeding from the gastric      polyp.  Final pathology report is pending.  5. Large hiatal hernia as per EGD report. Will await final reports of      pathology.   DISPOSITION:  Likely home in the next one to two days.      Margaretmary Dys, M.D.  Electronically Signed     AM/MEDQ  D:  05/03/2006  T:  05/03/2006  Job:  [id number]

## 2010-06-08 NOTE — Procedures (Signed)
   NAME:  [name], [name]                          ACCOUNT NO.:  [id number]   MEDICAL RECORD NO.:  [id number]                   PATIENT TYPE:  EMS   LOCATION:  ED                                   FACILITY:  [facility]   PHYSICIAN:  Edward L. Juanetta Gosling, M.D.             DATE OF BIRTH:  Apr 08, 1922   DATE OF PROCEDURE:  11/18/2001  DATE OF DISCHARGE:  11/18/2001                                EKG INTERPRETATION   The rhythm is a sinus rhythm with a rate of about 70.  Poor R-wave  progression across the precordium may indicate a previous anterior  myocardial infarction.  Clinical correlation is suggested.                                               Edward L. Juanetta Gosling, M.D.    ELH/MEDQ  D:  11/19/2001  T:  11/20/2001  Job:  [id number]

## 2010-06-08 NOTE — Op Note (Signed)
[name], [name]                ACCOUNT NO.:  [id number]   MEDICAL RECORD NO.:  [id number]          PATIENT TYPE:  AMB   LOCATION:  DAY                           [facility]   PHYSICIAN:  Kassie Mends, M.D.      DATE OF BIRTH:  06-08-1922   DATE OF PROCEDURE:  05/21/2006  DATE OF DISCHARGE:  05/21/2006                                PROCEDURE NOTE   PROCEDURE:  Esophagogastroduodenoscopy with snare polypectomy and cold  forceps biopsy of the polyp base.   ENDOSCOPIST:  Kassie Mends, M.D.   INDICATIONS FOR PROCEDURE:  [name] is an 77 year old female who had  an esophagogastroduodenoscopy with cold forceps biopsy of a pedunculated  polyp in April 2008.  The pathology report revealed an adenomatous polyp  with gastritis.  She has a history of pernicious anemia.  She presents  to have the polyp completely removed.   FINDINGS:  1. Pedunculated gastric polyp in the proximal body of the stomach with      the base having the appearance of adenomatous changes.  The gastric      polyp was removed via snare cautery (200/25).  Cold forceps      biopsies were obtained to biopsy around the base of the cauterized      site.  The base of the polypectomy site was tattooed with 5 to 7 mL      of [place] Ink.  2. Normal sigmoid esophagus. Otherwise without evidence of Barrett's,      erosions or ulcerations.  3. Normal duodenal bulb and second portion of the duodenum.   RECOMMENDATIONS:  1. Resume previous diet.  2. No aspirin, NSAIDs or anticoagulation for seven days.  3. Will call Ms. Busser with her biopsy report.   DESCRIPTION OF PROCEDURE:  Physical examination was performed and  informed consent was obtained from the patient after explaining the  benefits, risks and alternatives to the procedure.  The patient was  connected to the monitor and placed in the left lateral position.  Continuous oxygen was provided by nasal cannula and IV medications  administered through an indwelling  cannula.  After the administration of  sedation, the patient's esophagus was intubated and the scope was  advanced under direct visualization to the second portion of the  duodenum.  The scope was subsequently removed slowly by carefully  examining the color, texture, anatomy and integrity of the mucosa on the  way out.  The patient was recovered in endoscopy and discharged home in  satisfactory condition.      Kassie Mends, M.D.  Electronically Signed     SM/MEDQ  D:  05/28/2006  T:  05/28/2006  Job:  [id number]   cc:   Annia Friendly. Loleta Chance, MD  Fax: [phone]

## 2010-06-08 NOTE — Consult Note (Signed)
[name], [name]                ACCOUNT NO.:  [id number]   MEDICAL RECORD NO.:  [id number]          PATIENT TYPE:  INP   LOCATION:  A303                          FACILITY:  [facility]   PHYSICIAN:  Kassie Mends, M.D.      DATE OF BIRTH:  1922/08/10   DATE OF CONSULTATION:  DATE OF DISCHARGE:                                 CONSULTATION   PRIMARY CARE PHYSICIAN:  Dr. Mirna James in [place] at the [facility].   REASON FOR CONSULTATION:  Diarrhea, hematemesis.   HISTORY OF PRESENT ILLNESS:  The patient is an 77 year old African  American female who presented with weakness, acute onset nausea,  vomiting, and diarrhea.  She states that she developed symptoms on  Tuesday morning.  She recalls eating a fish sandwich at a fast food  restaurant the day before.  Her sister did as well but did not become  ill.  She woke up in the morning, developed diarrhea and followed by  vomiting.  She denies any blood in her emesis or stool.  Her stools were  dark.  Her symptoms persisted throughout that day.  She was unable to  eat.  Wednesday, the vomiting/diarrhea had started to subside but she  was unable to eat.  She became very weak and developed some blurred  vision while she was driving.  For these symptoms, she presented to the  emergency department yesterday.  This morning, she is able to tolerate a  full liquid diet.  She has had no bowel movements thus far.  She denies  any abdominal pain.  She states over the last several months, her  appetite has been diminished.  She states she has lost a significant  amount of weight but really is unable to define how much.  She denies  any dysphagia or odynophagia.  She has occasional heartburn.  Denies any  recent ill contacts.   MEDICATIONS AT HOME:  1. Os-Cal 500 mg b.i.d.  2. Aspirin 81 mg daily.  3. Lipitor 10 mg daily.  4. Lisinopril 10 mg daily.  5. Caduet 10 mg daily.  6. Levsin sublingual before meals.  7. Amlodipine 5 mg daily.   ALLERGIES:  CODEINE.   PAST MEDICAL HISTORY:  1. Hyperlipidemia.  2. According to the medical record, she has a history of macrocytic      anemia in 2006 and had a colonoscopy at the time which was      negative.  I have been unable to verify this myself.  The patient      is unaware of any details.   PAST SURGICAL HISTORY:  She denies any previous surgeries.   FAMILY HISTORY:  Sister had some sort of cancer but she does not know  the details.   SOCIAL HISTORY:  She is widowed.  She is retired.  She has one son.  Denies any tobacco or alcohol use.  She lives alone.   REVIEW OF SYSTEMS:  See HPI for GI and constitutional.  CARDIOPULMONARY:  She denies chest pain or shortness of breath.  GENITOURINARY:  Complains  of dysuria  and urinary frequency.   PHYSICAL EXAMINATION:  VITAL SIGNS:  Temp 98.9, pulse 83, respirations  20, blood pressure 101/54, weight 56.8 kg, height 66 inches.  GENERAL:  Pleasant, elderly, thin African American female in no acute  distress.  SKIN:  Warm and dry, no jaundice.  HEENT:  Sclerae non-icteric.  Oropharyngeal mucosa moist and pink.  No  lymphadenopathy or thyromegaly.  CHEST:  Lungs clear to auscultation.  CARDIAC:  Exam reveals regular rate and rhythm.  No murmurs, rubs, or  gallops.  ABDOMEN:  Positive bowel sounds, soft, nontender, nondistended, no  organomegaly or masses.  No rebound tenderness or guarding.  No  abdominal hernias.  EXTREMITIES:  No edema.   LABORATORY DATA:  White count 4100.  Hemoglobin on admission was 12.8,  it is 10.3 today.  Hematocrit 29.8, MCV 120.5.  Platelets 191,000.  Sodium 143, potassium 3.5, BUN 13, creatinine 0.92, glucose 101.  B12 is  low at 156, folate is 19.4.  Total bilirubin 0.6.  Alkaline phosphatase  93.  AST 28, ALT 16, albumin 3.9.  Amylase 129, lipase 18.  BNP less  than 30.  Urinalysis revealed a large amount of blood, positive  nitrites, 11-20 WBCs per high-power field and 7-10 RBCs per high-power   field.  Head CT revealed no acute disease.  She had acute abdominal  series which revealed a large hiatal hernia, cardiomegaly, bronchitic  changes, nonspecific bowel gas pattern, calcified fibroid in the pelvis,  questionable right paraspinal calcification at L4 versus right ureteral  stone.   IMPRESSION:  The patient is an 77 year old female who presents with  weakness and:  1. Acute onset nausea, vomiting, and diarrhea which appears to be      resolving.  Suspect this was a viral gastroenteritis versus food-      borne illness.  2. Macrocytic anemia with low B12 level.  According to history, she      had this in the past.  The patient does not recall ever receiving      regular B12 injections.  3. Several-month history of diminished appetite and unspecified amount      of weight loss, etiology not known at this time.  Appetite issues      may be related to her B12 deficiency and are quite nonspecific at      this time.   RECOMMENDATIONS:  1. Recommend B12 injections.  2. We will add iron and ferritin to labs.  3. Review her old records to see if she has ever had prior endoscopies      as the patient is unable to remember this      history.  4. Consider workup of anorexia and weight loss, especially if it is      well documented.  Can consider repletion of B12 and see how her      symptoms are at that point versus EGD.  We will discuss further      with Dr. Cira Servant.      Tana Coast, P.A.      Kassie Mends, M.D.  Electronically Signed    LL/MEDQ  D:  05/01/2006  T:  05/01/2006  Job:  [id number]   cc:   Annia Friendly. Loleta Chance, MD  Fax: [phone]

## 2010-06-13 ENCOUNTER — Encounter: Payer: Self-pay | Admitting: Gastroenterology

## 2010-06-13 ENCOUNTER — Ambulatory Visit (INDEPENDENT_AMBULATORY_CARE_PROVIDER_SITE_OTHER): Payer: Medicare Other | Admitting: Gastroenterology

## 2010-06-13 VITALS — BP 139/73 | HR 89 | Temp 98.2°F | Ht 62.0 in | Wt 137.8 lb

## 2010-06-13 DIAGNOSIS — D649 Anemia, unspecified: Secondary | ICD-10-CM

## 2010-06-13 NOTE — Progress Notes (Signed)
  Subjective:    Patient ID: [name], female    DOB: 1922/08/02, 77 y.o.   MRN: [id number]  PCP: [facility]  HPI Good appetite. Eats and then has to go to the [place]. Rarely has HA. Has knee or shoulder pain. Feet stays cold. Came for routine follow up. Pt "getting shots" from Dr. Mariel Sleet.  Past Medical History  Diagnosis Date  . Hiatal hernia   . HTN (hypertension)   . Hyperlipidemia   . Diverticulosis   . Carcinoid tumor of stomach 2008  . Adult BMI 19-24 kg/sq m 2008 125 lbs  . Anemia     Pernicious 2008-HB 12 MCV 119.5; JAN 2012 HB 13.2 MCV 97.6  . GERD (gastroesophageal reflux disease)     Past Surgical History  Procedure Date  . Colonoscopy 2003 LS    DC/[place] Diverticulosis  . Eus 2008 DJ    FNA NO CARCINOID  . Eus JAN 2009    NL EXAM, NO Bx  . Upper gastrointestinal endoscopy APR 2008 [facility]    CARCIONOID  . Upper gastrointestinal endoscopy FEB 2011 [facility]    CARCINOID  . Eus MAR 2011 WO    CARCINOID   Allergies  Allergen Reactions  . Codeine    Current Outpatient Prescriptions  Medication Sig Dispense Refill  . calcium-vitamin D (OSCAL WITH D) 500-200 MG-UNIT per tablet Take 1 tablet by mouth daily.        . diphenhydrAMINE (BENADRYL) 25 MG tablet Take 25 mg by mouth every 6 (six) hours as needed.        . famotidine (PEPCID) 20 MG tablet Take 20 mg by mouth 2 (two) times daily.        . felodipine (PLENDIL) 5 MG 24 hr tablet Take 5 mg by mouth daily.        . ferrous sulfate 325 (65 FE) MG tablet Take 325 mg by mouth daily with breakfast.        . simvastatin (ZOCOR) 40 MG tablet Take 40 mg by mouth at bedtime.           Review of Systems     Objective:   Physical Exam  Vitals reviewed. Constitutional: She appears well-developed and well-nourished. No distress.  HENT:  Head: Normocephalic and atraumatic.  Cardiovascular: Normal rate, regular rhythm and normal heart sounds.   Pulmonary/Chest: Effort normal and breath sounds normal.  Abdominal: Soft. Bowel  sounds are normal. She exhibits no distension. There is no tenderness.  Neurological: She is alert.  Psychiatric: Her behavior is normal.          Assessment & Plan:

## 2010-06-13 NOTE — Progress Notes (Signed)
Reminder in epic for pt to f/u in one year

## 2010-06-13 NOTE — Progress Notes (Signed)
Cc to PCP 

## 2010-06-14 DIAGNOSIS — D510 Vitamin B12 deficiency anemia due to intrinsic factor deficiency: Secondary | ICD-10-CM | POA: Insufficient documentation

## 2010-06-14 HISTORY — DX: Vitamin B12 deficiency anemia due to intrinsic factor deficiency: D51.0

## 2010-06-14 NOTE — Assessment & Plan Note (Signed)
2o to B12 deficiency and gastric carcinoid. LAST CBC JAN 2012-NL HB. FOLLOWS WITH DR. Mariel Sleet. No S/Sx of active GIB.  Continue B12/Fe supplementation. OPV in 12 mos. Consider repeat EUS in 2013.

## 2010-06-22 ENCOUNTER — Encounter (HOSPITAL_COMMUNITY): Payer: Medicare Other | Attending: Oncology

## 2010-06-22 ENCOUNTER — Encounter (HOSPITAL_COMMUNITY): Payer: Medicare Other

## 2010-06-22 DIAGNOSIS — E538 Deficiency of other specified B group vitamins: Secondary | ICD-10-CM

## 2010-07-20 ENCOUNTER — Encounter (HOSPITAL_COMMUNITY): Payer: Medicare Other

## 2010-07-20 DIAGNOSIS — E538 Deficiency of other specified B group vitamins: Secondary | ICD-10-CM

## 2010-07-21 ENCOUNTER — Encounter (HOSPITAL_COMMUNITY): Payer: Self-pay

## 2010-07-25 ENCOUNTER — Encounter (HOSPITAL_COMMUNITY): Payer: Self-pay | Admitting: *Deleted

## 2010-07-26 ENCOUNTER — Other Ambulatory Visit (HOSPITAL_COMMUNITY): Payer: Self-pay | Admitting: Family Medicine

## 2010-07-26 DIAGNOSIS — Z139 Encounter for screening, unspecified: Secondary | ICD-10-CM

## 2010-08-07 ENCOUNTER — Encounter (HOSPITAL_COMMUNITY): Payer: Medicare Other | Attending: Oncology

## 2010-08-07 DIAGNOSIS — D3A092 Benign carcinoid tumor of the stomach: Secondary | ICD-10-CM | POA: Insufficient documentation

## 2010-08-07 DIAGNOSIS — D649 Anemia, unspecified: Secondary | ICD-10-CM | POA: Insufficient documentation

## 2010-08-07 DIAGNOSIS — D510 Vitamin B12 deficiency anemia due to intrinsic factor deficiency: Secondary | ICD-10-CM | POA: Insufficient documentation

## 2010-08-07 LAB — CBC
MCH: 31.6 pg (ref 26.0–34.0)
MCHC: 32 g/dL (ref 30.0–36.0)
Platelets: 183 10*3/uL (ref 150–400)
RBC: 3.92 MIL/uL (ref 3.87–5.11)
RDW: 12.8 % (ref 11.5–15.5)

## 2010-08-07 LAB — COMPREHENSIVE METABOLIC PANEL
ALT: 7 U/L (ref 0–35)
AST: 20 U/L (ref 0–37)
Albumin: 3.9 g/dL (ref 3.5–5.2)
CO2: 26 mEq/L (ref 19–32)
Calcium: 9.9 mg/dL (ref 8.4–10.5)
Sodium: 141 mEq/L (ref 135–145)
Total Protein: 8 g/dL (ref 6.0–8.3)

## 2010-08-07 NOTE — Progress Notes (Signed)
Labs drawn today for Serotin, cbc, cmp and 24 hr urine for 5HIAA

## 2010-08-14 LAB — 5 HIAA, QUANTITATIVE, URINE, 24 HOUR: Volume, Urine-5HIAA: 850 mL/24 h

## 2010-08-15 ENCOUNTER — Encounter (HOSPITAL_BASED_OUTPATIENT_CLINIC_OR_DEPARTMENT_OTHER): Payer: Medicare Other | Admitting: Oncology

## 2010-08-15 ENCOUNTER — Encounter (HOSPITAL_COMMUNITY): Payer: Self-pay | Admitting: Oncology

## 2010-08-15 VITALS — BP 135/71 | HR 82 | Temp 98.8°F | Wt 130.4 lb

## 2010-08-15 DIAGNOSIS — D3A092 Benign carcinoid tumor of the stomach: Secondary | ICD-10-CM

## 2010-08-15 DIAGNOSIS — D649 Anemia, unspecified: Secondary | ICD-10-CM

## 2010-08-15 MED ORDER — TERBINAFINE HCL 1 % EX CREA: TOPICAL_CREAM | Freq: Two times a day (BID) | CUTANEOUS | Status: AC

## 2010-08-15 NOTE — Progress Notes (Signed)
Jessica Courier, MD [address] [place] [address] [place] [address]  1. Anemia  CBC, Differential  2. Benign carcinoid tumor of the stomach  5 HIAA, quantitative, urine, 24 hour, Serotonin serum    CURRENT THERAPY: Monthly B12 injections  INTERVAL HISTORY: [name] 77 y.o. female returns for followup of pernicious anemia and low-grade neuroendocrine carcinoid tumor.  She denies any complaints today. She denies any chest pain, shortness of breath, heart palpitations, blood in stool, black tarry stool, hematuria. She denies any urinary complaints. She reports that she feels as though she's doing quite well particularly since she is 77 years old. She is due for her monthly B12 injection on Friday. I will build the supportive plan in [place].  On further questioning and during physical examination, the patient reports a pruritic rash just inferior to mammary fold.  She reports that she does not sweats much however with the increased outside temperature lately she has noticed more perspiration. She has been putting cornstarch on this. Today I will be give her an antifungal ointment that she can applied twice daily to the affected area.  Past Medical History  Diagnosis Date  . Hiatal hernia   . HTN (hypertension)   . Hyperlipidemia   . Diverticulosis   . Carcinoid tumor of stomach 2008  . Adult BMI 19-24 kg/sq m 2008 125 lbs  . Anemia     Pernicious 2008-HB 12 MCV 119.5; JAN 2012 HB 13.2 MCV 97.6  . GERD (gastroesophageal reflux disease)   . Pernicious anemia   . Wrist fracture, left 2008  . Pernicious anemia 06/14/2010    has BENIGN CARCINOID TUMOR OF THE STOMACH; DM; HYPERLIPIDEMIA; HYPERTENSION; GERD; HEMOCCULT POSITIVE STOOL; WEAKNESS; and Pernicious anemia on her problem list.     is allergic to codeine.  [name] does not currently have medications on file.  Past Surgical History  Procedure Date  . Colonoscopy 2003 LS    DC/[place] Diverticulosis  . Eus 2008 DJ    FNA NO  CARCINOID  . Eus JAN 2009    NL EXAM, NO Bx  . Upper gastrointestinal endoscopy APR 2008 [facility]    CARCIONOID  . Upper gastrointestinal endoscopy FEB 2011 [facility]    CARCINOID  . Eus MAR 2011 WO    CARCINOID    Denies any headaches, dizziness, double vision, fevers, chills, night sweats, nausea, vomiting, diarrhea, constipation, chest pain, heart palpitations, shortness of breath, blood in stool, black tarry stool, urinary pain, urinary burning, urinary frequency, hematuria.   PHYSICAL EXAMINATION  ECOG PERFORMANCE STATUS: 0 - Asymptomatic  Filed Vitals:   08/15/10 1155  BP: 135/71  Pulse: 82  Temp: 98.8 F (37.1 C)    GENERAL:alert, no distress, well nourished, comfortable and cooperative SKIN: skin color, texture, turgor are normal, no rashes or significant lesions HEAD: Normocephalic, No masses, lesions, tenderness or abnormalities EYES: normal EARS: External ears normal OROPHARYNX:not examined  NECK: trachea midline LYMPH:  not examined BREAST:not examined LUNGS: clear to auscultation and percussion HEART: regular rate & rhythm, no murmurs, no gallops, S1 normal and S2 normal ABDOMEN:abdomen soft, non-tender and normal bowel sounds BACK: Back symmetric, no curvature., No CVA tenderness EXTREMITIES:less then 2 second capillary refill, no joint deformities, effusion, or inflammation, no edema, no skin discoloration, no clubbing, no cyanosis  NEURO: alert & oriented x 3 with fluent speech, no focal motor/sensory deficits, gait normal    LABORATORY DATA: Lab Results  Component Value Date   WBC 6.5 08/07/2010  HGB 12.4 08/07/2010   HCT 38.7 08/07/2010   MCV 98.7 08/07/2010   PLT 183 08/07/2010     Chemistry      Component Value Date/Time   NA 141 08/07/2010 1100   K 4.0 08/07/2010 1100   CL 105 08/07/2010 1100   CO2 26 08/07/2010 1100   BUN 21 08/07/2010 1100   CREATININE 1.07 08/07/2010 1100      Component Value Date/Time   CALCIUM 9.9 08/07/2010 1100   ALKPHOS 100  08/07/2010 1100   AST 20 08/07/2010 1100   ALT 7 08/07/2010 1100   BILITOT 0.4 08/07/2010 1100         ASSESSMENT:  1. Pernicious anemia, presenting with anemia, MCV 123, methylmalonic acid level 6600, B12 142.  No on B12 replacement monthly. 2. Stomach polyp resection with what appears to be a benign lesion, plus a very low-grade neuroendocrine carcinoid tumor 3. Mild memory loss, which has improved. 4. Slightly increased 5 HIAA at 7.3   PLAN:  1. CBC diff, serum serotonin, and 24 hr urine collection for 5-HIAA in 6 months 2. Monthly B12 injection 3. Return in 6 months following above lab work.   All questions were answered. The patient knows to call the clinic with any problems, questions or concerns. We can certainly see the patient much sooner if necessary.  The patient and plan discussed with Glenford Peers, MD and he is in agreement with the aforementioned.  I spent 25 minutes counseling the patient face to face. The total time spent in the appointment was 40 minutes.  [name]

## 2010-08-15 NOTE — Patient Instructions (Signed)
[facility]  Discharge Instructions  RECOMMENDATIONS MADE BY THE CONSULTANT AND ANY TEST RESULTS WILL BE SENT TO YOUR REFERRING DOCTOR.   SPECIAL INSTRUCTIONS/FOLLOW-UP: Lab work Needed in 6 months and Return to Clinic in 6 months following lab work, appointments will be made at the front desk on your way out.  You are to return to the clinic Friday 08/17/2010 for you B12 injection.   I acknowledge that I have been informed and understand all the instructions given to me and received a copy. I do not have any more questions at this time, but understand that I may call the Specialty Clinic at [facility] at [phone] during business hours should I have any further questions or need assistance in obtaining follow-up care.    __________________________________________  _____________  __________ Signature of Patient or Authorized Representative            Date                   Time    __________________________________________ Nurse's Signature

## 2010-08-17 ENCOUNTER — Encounter (HOSPITAL_BASED_OUTPATIENT_CLINIC_OR_DEPARTMENT_OTHER): Payer: Medicare Other

## 2010-08-17 VITALS — BP 121/65 | HR 77

## 2010-08-17 DIAGNOSIS — D510 Vitamin B12 deficiency anemia due to intrinsic factor deficiency: Secondary | ICD-10-CM

## 2010-08-17 MED ORDER — CYANOCOBALAMIN 1000 MCG/ML IJ SOLN: 1000.0000 ug | Freq: Once | INTRAMUSCULAR | Status: DC

## 2010-08-17 MED ORDER — CYANOCOBALAMIN 1000 MCG/ML IJ SOLN
INTRAMUSCULAR | Status: AC
  Filled 2010-08-17: qty 1

## 2010-08-17 NOTE — Progress Notes (Signed)
Vss. Vitamin B 12 given IM Z-track to right deltoid. Tolerated well.

## 2010-09-04 ENCOUNTER — Ambulatory Visit (HOSPITAL_COMMUNITY)
Admission: RE | Admit: 2010-09-04 | Discharge: 2010-09-04 | Disposition: A | Discharge status: Home / Self Care | Payer: Medicare Other | Source: Ambulatory Visit | Attending: Family Medicine | Admitting: Family Medicine

## 2010-09-04 DIAGNOSIS — Z139 Encounter for screening, unspecified: Secondary | ICD-10-CM

## 2010-09-04 DIAGNOSIS — Z1231 Encounter for screening mammogram for malignant neoplasm of breast: Secondary | ICD-10-CM | POA: Insufficient documentation

## 2010-09-13 ENCOUNTER — Encounter (HOSPITAL_COMMUNITY): Payer: Medicare Other | Attending: Oncology

## 2010-09-13 VITALS — BP 119/65 | HR 79

## 2010-09-13 DIAGNOSIS — D510 Vitamin B12 deficiency anemia due to intrinsic factor deficiency: Secondary | ICD-10-CM | POA: Insufficient documentation

## 2010-09-13 MED ORDER — CYANOCOBALAMIN 1000 MCG/ML IJ SOLN
1000.0000 ug | Freq: Once | INTRAMUSCULAR | Status: AC
  Administered 2010-09-13: 1000 ug via INTRAMUSCULAR

## 2010-09-13 MED ORDER — CYANOCOBALAMIN 1000 MCG/ML IJ SOLN
INTRAMUSCULAR | Status: AC
  Administered 2010-09-13: 1000 ug via INTRAMUSCULAR
  Filled 2010-09-13: qty 1

## 2010-09-13 NOTE — Progress Notes (Signed)
Perley Jain presents today for injection per MD orders. Vit b 12 administered IM in right Upper Arm. Administration without incident. Patient tolerated well.

## 2010-10-11 ENCOUNTER — Encounter (HOSPITAL_COMMUNITY): Payer: Medicare Other | Attending: Oncology

## 2010-10-11 VITALS — BP 123/70 | HR 84

## 2010-10-11 DIAGNOSIS — E538 Deficiency of other specified B group vitamins: Secondary | ICD-10-CM | POA: Insufficient documentation

## 2010-10-11 LAB — DIFFERENTIAL
Basophils Relative: 1
Eosinophils Absolute: 0.1
Eosinophils Relative: 1
Lymphs Abs: 1.5
Monocytes Absolute: 0.5
Monocytes Relative: 7

## 2010-10-11 LAB — CBC
HCT: 42.1
Hemoglobin: 13.8
MCHC: 32.7
MCV: 96.1
RBC: 4.39

## 2010-10-11 MED ORDER — CYANOCOBALAMIN 1000 MCG/ML IJ SOLN
INTRAMUSCULAR | Status: AC
  Administered 2010-10-11: 1000 ug via INTRAMUSCULAR
  Filled 2010-10-11: qty 1

## 2010-10-11 MED ORDER — CYANOCOBALAMIN 1000 MCG/ML IJ SOLN
1000.0000 ug | Freq: Once | INTRAMUSCULAR | Status: AC
  Administered 2010-10-11: 1000 ug via INTRAMUSCULAR

## 2010-10-18 LAB — CBC
MCHC: 32.8
MCV: 96.9
RDW: 12.4

## 2010-10-18 LAB — DIFFERENTIAL
Basophils Absolute: 0
Basophils Relative: 1
Eosinophils Absolute: 0
Neutro Abs: 3.5
Neutrophils Relative %: 68

## 2010-11-01 LAB — CBC
HCT: 38.3
Hemoglobin: 12.8
Hemoglobin: 12.2
MCHC: 32.5
MCV: 98.1
RBC: 4.1
RDW: 14.7 — ABNORMAL HIGH
WBC: 11.5 — ABNORMAL HIGH

## 2010-11-01 LAB — DIFFERENTIAL
Basophils Absolute: 0
Basophils Relative: 0
Eosinophils Absolute: 0
Eosinophils Relative: 0
Lymphocytes Relative: 22
Lymphocytes Relative: 12
Lymphs Abs: 1.4
Monocytes Absolute: 0.6
Monocytes Absolute: 0.7
Monocytes Relative: 8
Monocytes Relative: 6
Neutro Abs: 5.5
Neutrophils Relative %: 69

## 2010-11-01 LAB — BASIC METABOLIC PANEL
BUN: 16
Chloride: 105
GFR calc non Af Amer: 46 — ABNORMAL LOW
Potassium: 3.6
Sodium: 139

## 2010-11-01 LAB — POCT CARDIAC MARKERS: Troponin i, poc: <0.05

## 2010-11-08 ENCOUNTER — Encounter (HOSPITAL_COMMUNITY): Payer: Medicare Other | Attending: Oncology

## 2010-11-08 VITALS — BP 169/81 | HR 97

## 2010-11-08 DIAGNOSIS — D510 Vitamin B12 deficiency anemia due to intrinsic factor deficiency: Secondary | ICD-10-CM | POA: Insufficient documentation

## 2010-11-08 LAB — DIFFERENTIAL
Basophils Relative: 0
Eosinophils Relative: 0
Monocytes Absolute: 0.7
Neutro Abs: 9.9 — ABNORMAL HIGH
Neutrophils Relative %: 84 — ABNORMAL HIGH

## 2010-11-08 LAB — POCT CARDIAC MARKERS
Myoglobin, poc: 64.3
Operator id: 221061
Troponin i, poc: <0.05

## 2010-11-08 LAB — BASIC METABOLIC PANEL
CO2: 25
Chloride: 112
Creatinine, Ser: 1.24 — ABNORMAL HIGH
GFR calc Af Amer: 50 — ABNORMAL LOW
Potassium: 4.3
Sodium: 143

## 2010-11-08 LAB — CBC
Hemoglobin: 9.6 — ABNORMAL LOW
MCV: 120 — ABNORMAL HIGH
RBC: 2.28 — ABNORMAL LOW
WBC: 11.8 — ABNORMAL HIGH

## 2010-11-08 LAB — URINALYSIS, ROUTINE W REFLEX MICROSCOPIC
Ketones, ur: NEGATIVE
Nitrite: NEGATIVE
Protein, ur: NEGATIVE
pH: 5.5

## 2010-11-08 LAB — URINE MICROSCOPIC-ADD ON

## 2010-11-08 MED ORDER — CYANOCOBALAMIN 1000 MCG/ML IJ SOLN
1000.0000 ug | Freq: Once | INTRAMUSCULAR | Status: AC
  Administered 2010-11-08: 1000 ug via INTRAMUSCULAR

## 2010-11-08 MED ORDER — CYANOCOBALAMIN 1000 MCG/ML IJ SOLN
INTRAMUSCULAR | Status: AC
  Administered 2010-11-08: 1000 ug via INTRAMUSCULAR
  Filled 2010-11-08: qty 1

## 2010-12-11 ENCOUNTER — Encounter (HOSPITAL_COMMUNITY): Payer: Medicare Other | Attending: Oncology

## 2010-12-11 VITALS — BP 156/74 | HR 78

## 2010-12-11 DIAGNOSIS — D510 Vitamin B12 deficiency anemia due to intrinsic factor deficiency: Secondary | ICD-10-CM | POA: Insufficient documentation

## 2010-12-11 MED ORDER — CYANOCOBALAMIN 1000 MCG/ML IJ SOLN
1000.0000 ug | Freq: Once | INTRAMUSCULAR | Status: AC
  Administered 2010-12-11: 1000 ug via INTRAMUSCULAR

## 2010-12-11 MED ORDER — CYANOCOBALAMIN 1000 MCG/ML IJ SOLN
INTRAMUSCULAR | Status: AC
  Administered 2010-12-11: 1000 ug via INTRAMUSCULAR
  Filled 2010-12-11: qty 1

## 2010-12-11 NOTE — Progress Notes (Signed)
Tolerated injection well. 

## 2011-01-10 ENCOUNTER — Encounter (HOSPITAL_COMMUNITY): Payer: Medicare Other | Attending: Oncology

## 2011-01-10 VITALS — BP 151/71 | HR 92

## 2011-01-10 DIAGNOSIS — D510 Vitamin B12 deficiency anemia due to intrinsic factor deficiency: Secondary | ICD-10-CM | POA: Insufficient documentation

## 2011-01-10 MED ORDER — CYANOCOBALAMIN 1000 MCG/ML IJ SOLN
1000.0000 ug | Freq: Once | INTRAMUSCULAR | Status: AC
  Administered 2011-01-10: 1000 ug via INTRAMUSCULAR

## 2011-01-10 NOTE — Progress Notes (Signed)
[name] presents today for injection per MD orders. B12 1000 mcg administered IM in left Upper Arm. Administration without incident. Patient tolerated well.  

## 2011-02-07 ENCOUNTER — Encounter (HOSPITAL_COMMUNITY): Payer: Medicare Other | Attending: Oncology

## 2011-02-07 DIAGNOSIS — D3A092 Benign carcinoid tumor of the stomach: Secondary | ICD-10-CM | POA: Insufficient documentation

## 2011-02-07 DIAGNOSIS — D649 Anemia, unspecified: Secondary | ICD-10-CM

## 2011-02-07 DIAGNOSIS — D510 Vitamin B12 deficiency anemia due to intrinsic factor deficiency: Secondary | ICD-10-CM | POA: Insufficient documentation

## 2011-02-07 MED ORDER — CYANOCOBALAMIN 1000 MCG/ML IJ SOLN
INTRAMUSCULAR | Status: AC
  Filled 2011-02-07: qty 1

## 2011-02-07 MED ORDER — CYANOCOBALAMIN 1000 MCG/ML IJ SOLN
1000.0000 ug | Freq: Once | INTRAMUSCULAR | Status: AC
  Administered 2011-02-07: 1000 ug via INTRAMUSCULAR

## 2011-02-07 NOTE — Progress Notes (Signed)
[name] presents today for injection per MD orders. B12 1000 mcg administered IM in right Upper Arm. Administration without incident. Patient tolerated well.  

## 2011-02-08 ENCOUNTER — Other Ambulatory Visit (HOSPITAL_COMMUNITY): Payer: Medicare Other

## 2011-02-12 ENCOUNTER — Encounter (HOSPITAL_BASED_OUTPATIENT_CLINIC_OR_DEPARTMENT_OTHER): Payer: Medicare Other

## 2011-02-12 DIAGNOSIS — D3A092 Benign carcinoid tumor of the stomach: Secondary | ICD-10-CM

## 2011-02-12 LAB — CBC
Hemoglobin: 12.5 g/dL (ref 12.0–15.0)
MCH: 31.6 pg (ref 26.0–34.0)
MCV: 99 fL (ref 78.0–100.0)
RBC: 3.96 MIL/uL (ref 3.87–5.11)

## 2011-02-12 LAB — DIFFERENTIAL
Basophils Absolute: 0 10*3/uL (ref 0.0–0.1)
Eosinophils Absolute: 0.1 10*3/uL (ref 0.0–0.7)
Eosinophils Relative: 1 % (ref 0–5)
Lymphs Abs: 2 10*3/uL (ref 0.7–4.0)
Monocytes Absolute: 0.4 10*3/uL (ref 0.1–1.0)
Monocytes Relative: 8 % (ref 3–12)

## 2011-02-12 NOTE — Progress Notes (Signed)
Labs drawn today for Serotonin and 24 hour urine for 5HIAA

## 2011-02-13 ENCOUNTER — Other Ambulatory Visit (HOSPITAL_COMMUNITY): Payer: Medicare Other

## 2011-02-15 ENCOUNTER — Encounter (HOSPITAL_COMMUNITY): Payer: Self-pay | Admitting: Oncology

## 2011-02-15 ENCOUNTER — Ambulatory Visit (HOSPITAL_COMMUNITY): Payer: Medicare Other | Admitting: Oncology

## 2011-02-15 ENCOUNTER — Encounter (HOSPITAL_BASED_OUTPATIENT_CLINIC_OR_DEPARTMENT_OTHER): Payer: Medicare Other | Admitting: Oncology

## 2011-02-15 DIAGNOSIS — R413 Other amnesia: Secondary | ICD-10-CM

## 2011-02-15 DIAGNOSIS — D3A092 Benign carcinoid tumor of the stomach: Secondary | ICD-10-CM

## 2011-02-15 DIAGNOSIS — D510 Vitamin B12 deficiency anemia due to intrinsic factor deficiency: Secondary | ICD-10-CM

## 2011-02-15 HISTORY — DX: Other amnesia: R41.3

## 2011-02-15 NOTE — Patient Instructions (Signed)
[facility]  Discharge Instructions  RECOMMENDATIONS MADE BY THE CONSULTANT AND ANY TEST RESULTS WILL BE SENT TO YOUR REFERRING DOCTOR.   EXAM FINDINGS BY MD TODAY AND SIGNS AND SYMPTOMS TO REPORT TO CLINIC OR PRIMARY MD: You look good and are doing well. Continue coming monthly for your B12 injections. We will repeat lab work again in 6 months and have you see the doctor then as well. Report any issues or concerns to this clinic as needed.    I acknowledge that I have been informed and understand all the instructions given to me and received a copy. I do not have any more questions at this time, but understand that I may call the Specialty Clinic at [facility] at [phone] during business hours should I have any further questions or need assistance in obtaining follow-up care.    __________________________________________  _____________  __________ Signature of Patient or Authorized Representative            Date                   Time    __________________________________________ Nurse's Signature

## 2011-02-15 NOTE — Progress Notes (Signed)
Jessica Courier, MD, MD [address] [place] [address] [place] [address]  1. Pernicious anemia  CBC, Differential, Comprehensive metabolic panel, Serotonin serum, 5 HIAA, quantitative, urine, 24 hour  2. Benign carcinoid tumor of the stomach  CBC, Differential, Comprehensive metabolic panel, Serotonin serum, 5 HIAA, quantitative, urine, 24 hour  3. Mild memory loss      CURRENT THERAPY:Monthly B12 injections and observation with serum serotonin and 24 hour urine collection for 5-HIAA   INTERVAL HISTORY: KENZLEY KE 78 y.o. female returns for  regular  visit for followup of  pernicious anemia and low-grade neuroendocrine carcinoid tumor.  The patient recently visited the clinic to perform lab work including serum serotonin and 24 hour urine collection on 02/12/2011.  Results are still pending.   The patient just turned 78 years old the other day.  She looks wonderful and feels great.  She is still driving at her age.  She is proud of that.  Mentally, she seems pretty sharp as well.   Annice denies any complaints including flushing, hot flashes, change in bowel habits, constipation, diarrhea, heart palpitations, sudden onset of high energy/excitement, and chest pains.    She reports that her appetite is strong and she denies any weight loss.  She has lost about 5 lbs since July 2012 when she weighed 130 lbs compared to 125 lbs today.  I personally reviewed and went over laboratory results with the patient, but the serum serotonin and 5-HIAA are pending.    I personally reviewed and went over radiographic studies with the patient.  Her mammogram was negative in 08/2010 and she will be due for another yearly screening mammogram in 08/2011.   Past Medical History  Diagnosis Date  . Hiatal hernia   . HTN (hypertension)   . Hyperlipidemia   . Diverticulosis   . Carcinoid tumor of stomach 2008  . Adult BMI 19-24 kg/sq m 2008 125 lbs  . Anemia     Pernicious 2008-HB 12 MCV 119.5; JAN 2012 HB  13.2 MCV 97.6  . GERD (gastroesophageal reflux disease)   . Pernicious anemia   . Wrist fracture, left 2008  . Pernicious anemia 06/14/2010  . Mild memory loss 02/15/2011    has Neuroendocrine carcinoid tumor of stomach; DM; HYPERLIPIDEMIA; HYPERTENSION; GERD; HEMOCCULT POSITIVE STOOL; WEAKNESS; Pernicious anemia; and Mild memory loss on her problem list.     is allergic to codeine.  Ms. Vitug does not currently have medications on file.  Past Surgical History  Procedure Date  . Colonoscopy 2003 LS    DC/[place] Diverticulosis  . Eus 2008 DJ    FNA NO CARCINOID  . Eus JAN 2009    NL EXAM, NO Bx  . Upper gastrointestinal endoscopy APR 2008 [facility]    CARCIONOID  . Upper gastrointestinal endoscopy FEB 2011 [facility]    CARCINOID  . Eus MAR 2011 WO    CARCINOID    Denies any headaches, dizziness, double vision, fevers, chills, night sweats, nausea, vomiting, diarrhea, constipation, chest pain, heart palpitations, shortness of breath, blood in stool, black tarry stool, urinary pain, urinary burning, urinary frequency, hematuria.   PHYSICAL EXAMINATION  ECOG PERFORMANCE STATUS: 1 - Symptomatic but completely ambulatory  Filed Vitals:   02/15/11 0955  BP: 163/77  Pulse: 104  Temp: 97.4 F (36.3 C)    GENERAL:alert, no distress, well nourished, well developed, comfortable, cooperative and smiling SKIN: skin color, texture, turgor are normal, no rashes or significant lesions HEAD: Normocephalic, No masses, lesions, tenderness  or abnormalities EYES: normal EARS: External ears normal OROPHARYNX:mucous membranes are moist  NECK: supple, no adenopathy, no bruits, thyroid normal size, non-tender, without nodularity, no stridor, non-tender, trachea midline LYMPH:  no palpable lymphadenopathy, no hepatosplenomegaly BREAST:not examined LUNGS: clear to auscultation and percussion HEART: regular rate & rhythm, no murmurs, no gallops, S1 normal and S2 normal ABDOMEN:abdomen soft, non-tender,  normal bowel sounds, no masses or organomegaly and no hepatosplenomegaly BACK: Back symmetric, no curvature., No CVA tenderness EXTREMITIES:less then 2 second capillary refill, no joint deformities, effusion, or inflammation, no edema, no skin discoloration, no clubbing, no cyanosis  NEURO: alert & oriented x 3 with fluent speech, no focal motor/sensory deficits, gait normal    LABORATORY DATA: CBC    Component Value Date/Time   WBC 5.6 02/12/2011 0922   RBC 3.96 02/12/2011 0922   HGB 12.5 02/12/2011 0922   HCT 39.2 02/12/2011 0922   PLT 175 02/12/2011 0922   MCV 99.0 02/12/2011 0922   MCH 31.6 02/12/2011 0922   MCHC 31.9 02/12/2011 0922   RDW 12.6 02/12/2011 0922   LYMPHSABS 2.0 02/12/2011 0922   MONOABS 0.4 02/12/2011 0922   EOSABS 0.1 02/12/2011 0922   BASOSABS 0.0 02/12/2011 0922      Chemistry      Component Value Date/Time   NA 141 08/07/2010 1100   K 4.0 08/07/2010 1100   CL 105 08/07/2010 1100   CO2 26 08/07/2010 1100   BUN 21 08/07/2010 1100   CREATININE 1.07 08/07/2010 1100      Component Value Date/Time   CALCIUM 9.9 08/07/2010 1100   ALKPHOS 100 08/07/2010 1100   AST 20 08/07/2010 1100   ALT 7 08/07/2010 1100   BILITOT 0.4 08/07/2010 1100       PENDING LABS: Serum serotonin and 24 hour urine collection for 5-HIAA   RADIOGRAPHIC STUDIES:  09/05/2010  DG SCREEN MAMMOGRAM BILATERAL  Bilateral CC and MLO view(s) were taken.  DIGITAL SCREENING MAMMOGRAM WITH CAD:  The breast tissue is heterogeneously dense. No masses or malignant type calcifications are  identified. Compared with prior studies.  Images were processed with CAD.  IMPRESSION:  No specific mammographic evidence of malignancy. Next screening mammogram is recommended in one  year.  A result letter of this screening mammogram will be mailed directly to the patient.  ASSESSMENT: Negative - BI-RADS 1  Screening mammogram in 1 year.   PATHOLOGY: 05/21/2006  REPORT OF SURGICAL PATHOLOGY  Case #:  [id number] Patient Name: [place], Brittnae L. Office Chart Number: N/A  MRN: [id number] Pathologist: Havery Moros, MD DOB/Age [birth year] (Age: 25) Gender: F Date Taken: 05/21/2006 Date Received: 05/21/2006  FINAL DIAGNOSIS  MICROSCOPIC EXAMINATION AND DIAGNOSIS  1. STOMACH, POLYP: HYPERPLASTIC POLYP WITH FOCI OF GLANDULAR ATYPIA CONSISTENT WITH ADENOMATOUS CHANGES, SEE COMMENT.  2. STOMACH, BASE OF POLYP, BIOPSY: SMALL FOCI OF ATYPICAL CELLULAR PROLIFERATION MOST SUGGESTIVE OF NEOPLASM, SEE COMMENT.  COMMENT 1. The sections show a hyperplastic polyp for the most part. This is associated with several foci of glandular atypia consistent with adenomatous changes. No invasive carcinoma is identified. A Warthin-Starry stain is performed to determine the possibility of the presence of Helicobacter pylori. The Warthin-Starry stain is negative for organisms of Helicobacter pylori. The control(s) stained appropriately.  2. The sections show gastric type mucosa displaying areas of hyperplastic gastric type mucosa with intestinal metaplasia. On initial levels of sectioning, one of the biopsy fragments in particular shows a cellular infiltrate in the lamina propria displaying low grade nuclei. The infiltrate  displays solid nests and pseudoglandular formation. The deeper levels show a small focus of similar cells with a mucinous type stroma. The appearance is most suggestive of a neoplastic cellular infiltrate. The nature of this cellular infiltrate is uncertain. Immunohistochemical stains for AE1/AE3, synaptophysin and chromogranin were performed in an attempt to evaluate this process. The stains show a few clusters of cells with cytokeratin, synaptophysin and chromogranin positivity. The findings are particularly concerning for a carcinoid tumor. I would strongly recommend additional material/biopsies from that area for further evaluation. A Warthin-Starry stain is performed to determine  the possibility of the presence of Helicobacter pylori. The Warthin-Starry stain is negative for organisms of Helicobacter pylori. The control(s) stained appropriately. Dr. Luisa Hart reviewed this case and concurs. (BNS:mw 05/26/06)  mw Date Reported: 05/27/2006 Havery Moros, MD  Electronically Signed Out By BNS       ASSESSMENT:  1. Pernicious anemia, presenting with anemia, MCV 123, methylmalonic acid level 6600, B12 142. No on B12 replacement monthly.  2. Stomach polyp resection with what appears to be a benign lesion, plus a very low-grade neuroendocrine carcinoid tumor  3. Mild memory loss, which has improved.  4. Slightly increased 5 HIAA at 7.3 in July 2012.  Most recent results still pending   PLAN:  1. Mammogram will be due 08/2011. 2. I personally reviewed and went over radiographic studies with the patient. 3. I personally reviewed and went over laboratory results with the patient. 4. Lab work in 6 months: Serum serotonin, 24 hour urine collection for 5-HIAA, CBC diff, CMET 5. Return in 6 months  All questions were answered. The patient knows to call the clinic with any problems, questions or concerns. We can certainly see the patient much sooner if necessary.  The patient and plan discussed with Glenford Peers, MD and he is in agreement with the aforementioned.  I spent 20 minutes counseling the patient face to face. The total time spent in the appointment was 30 minutes.  Yaris Ferrell

## 2011-02-18 LAB — 5 HIAA, QUANTITATIVE, URINE, 24 HOUR
5-HIAA, 24 Hr Urine: 7.6 mg/24 h — ABNORMAL HIGH (ref <=6.0)
Volume, Urine-5HIAA: 900 mL/24 h

## 2011-03-07 ENCOUNTER — Encounter (HOSPITAL_COMMUNITY): Payer: Medicare Other | Attending: Oncology

## 2011-03-07 VITALS — BP 131/77 | HR 89

## 2011-03-07 DIAGNOSIS — D510 Vitamin B12 deficiency anemia due to intrinsic factor deficiency: Secondary | ICD-10-CM | POA: Insufficient documentation

## 2011-03-07 MED ORDER — CYANOCOBALAMIN 1000 MCG/ML IJ SOLN
1000.0000 ug | Freq: Once | INTRAMUSCULAR | Status: AC
  Administered 2011-03-07: 1000 ug via INTRAMUSCULAR

## 2011-03-07 MED ORDER — CYANOCOBALAMIN 1000 MCG/ML IJ SOLN
INTRAMUSCULAR | Status: AC
  Filled 2011-03-07: qty 1

## 2011-03-07 NOTE — Progress Notes (Signed)
[name] presents today for injection per MD orders. B12 1000mcg administered SQ in left Upper Arm. Administration without incident. Patient tolerated well.  

## 2011-04-04 ENCOUNTER — Encounter (HOSPITAL_COMMUNITY): Payer: Medicare Other | Attending: Oncology

## 2011-04-04 DIAGNOSIS — D510 Vitamin B12 deficiency anemia due to intrinsic factor deficiency: Secondary | ICD-10-CM | POA: Insufficient documentation

## 2011-04-04 MED ORDER — CYANOCOBALAMIN 1000 MCG/ML IJ SOLN
1000.0000 ug | Freq: Once | INTRAMUSCULAR | Status: AC
  Administered 2011-04-04: 1000 ug via INTRAMUSCULAR

## 2011-04-04 MED ORDER — CYANOCOBALAMIN 1000 MCG/ML IJ SOLN
INTRAMUSCULAR | Status: AC
  Filled 2011-04-04: qty 1

## 2011-04-04 NOTE — Progress Notes (Signed)
[name] presents today for injection per MD orders. B12 1000mcg administered SQ in left Upper Arm. Administration without incident. Patient tolerated well.  

## 2011-05-02 ENCOUNTER — Encounter (HOSPITAL_COMMUNITY): Payer: Medicare Other | Attending: Oncology

## 2011-05-02 DIAGNOSIS — D510 Vitamin B12 deficiency anemia due to intrinsic factor deficiency: Secondary | ICD-10-CM | POA: Insufficient documentation

## 2011-05-02 MED ORDER — CYANOCOBALAMIN 1000 MCG/ML IJ SOLN
1000.0000 ug | Freq: Once | INTRAMUSCULAR | Status: AC
  Administered 2011-05-02: 1000 ug via INTRAMUSCULAR

## 2011-05-02 MED ORDER — CYANOCOBALAMIN 1000 MCG/ML IJ SOLN
INTRAMUSCULAR | Status: AC
  Filled 2011-05-02: qty 1

## 2011-05-02 NOTE — Progress Notes (Signed)
[name] presents today for injection per the provider's orders.  Vit B12 administered administration without incident; see MAR for injection details.  Patient tolerated procedure well and without incident.  No questions or complaints noted at this time.  

## 2011-05-23 ENCOUNTER — Encounter: Payer: Self-pay | Admitting: Gastroenterology

## 2011-05-29 ENCOUNTER — Telehealth: Payer: Self-pay | Admitting: Gastroenterology

## 2011-05-29 ENCOUNTER — Ambulatory Visit: Payer: Medicare Other | Admitting: Gastroenterology

## 2011-05-29 NOTE — Telephone Encounter (Signed)
Mailed letter to patient to call office to set up OV °

## 2011-05-29 NOTE — Telephone Encounter (Signed)
CONTACT PT TO [facility] 

## 2011-05-29 NOTE — Telephone Encounter (Signed)
Pt was a no show

## 2011-06-04 ENCOUNTER — Encounter (HOSPITAL_COMMUNITY): Payer: Medicare Other | Attending: Oncology

## 2011-06-04 DIAGNOSIS — D510 Vitamin B12 deficiency anemia due to intrinsic factor deficiency: Secondary | ICD-10-CM

## 2011-06-04 MED ORDER — CYANOCOBALAMIN 1000 MCG/ML IJ SOLN
1000.0000 ug | Freq: Once | INTRAMUSCULAR | Status: AC
  Administered 2011-06-04: 1000 ug via INTRAMUSCULAR

## 2011-06-04 NOTE — Progress Notes (Signed)
[name] presents today for injection per the provider's orders.  Vit B12 administered administration without incident; see MAR for injection details.  Patient tolerated procedure well and without incident.  No questions or complaints noted at this time.

## 2011-06-27 ENCOUNTER — Encounter (HOSPITAL_COMMUNITY): Payer: Medicare Other | Attending: Oncology

## 2011-06-27 VITALS — BP 148/58 | HR 83

## 2011-06-27 DIAGNOSIS — D510 Vitamin B12 deficiency anemia due to intrinsic factor deficiency: Secondary | ICD-10-CM

## 2011-06-27 MED ORDER — CYANOCOBALAMIN 1000 MCG/ML IJ SOLN
INTRAMUSCULAR | Status: AC
  Filled 2011-06-27: qty 1

## 2011-06-27 MED ORDER — CYANOCOBALAMIN 1000 MCG/ML IJ SOLN
1000.0000 ug | Freq: Once | INTRAMUSCULAR | Status: AC
  Administered 2011-06-27: 1000 ug via INTRAMUSCULAR

## 2011-06-27 NOTE — Progress Notes (Signed)
[name] presents today for injection per MD orders. B12 1000mcg administered SQ in left Upper Arm. Administration without incident. Patient tolerated well.  

## 2011-07-08 ENCOUNTER — Encounter: Payer: Self-pay | Admitting: Gastroenterology

## 2011-07-11 ENCOUNTER — Encounter: Payer: Self-pay | Admitting: Gastroenterology

## 2011-07-11 ENCOUNTER — Ambulatory Visit (INDEPENDENT_AMBULATORY_CARE_PROVIDER_SITE_OTHER): Payer: Medicare Other | Admitting: Gastroenterology

## 2011-07-11 VITALS — BP 140/70 | HR 86 | Temp 98.2°F | Ht 61.0 in | Wt 124.0 lb

## 2011-07-11 DIAGNOSIS — D3A092 Benign carcinoid tumor of the stomach: Secondary | ICD-10-CM

## 2011-07-11 DIAGNOSIS — D510 Vitamin B12 deficiency anemia due to intrinsic factor deficiency: Secondary | ICD-10-CM

## 2011-07-11 NOTE — Progress Notes (Signed)
Faxed to PCP

## 2011-07-11 NOTE — Patient Instructions (Signed)
YOU ARE DOING WELL!  FOLLOW UP WITH ME IN ONE YEAR.

## 2011-07-11 NOTE — Progress Notes (Signed)
  Subjective:    Patient ID: [name], female    DOB: 01-25-1922, 78 y.o.   MRN: [id number]  PCP: Mariel Sleet  HPI NO BLACK TARRY STOOL OR BRBPR. LAST B12 SHOT JUN 2013.  Past Medical History  Diagnosis Date  . Hiatal hernia   . HTN (hypertension)   . Hyperlipidemia   . Diverticulosis   . Carcinoid tumor of stomach 2008  . Adult BMI 19-24 kg/sq m 2008 125 lbs  . Anemia     Pernicious 2008-HB 12 MCV 119.5; JAN 2012 HB 13.2 MCV 97.6  . GERD (gastroesophageal reflux disease)   . Pernicious anemia   . Wrist fracture, left 2008  . Pernicious anemia 06/14/2010  . Mild memory loss 02/15/2011    Past Surgical History  Procedure Date  . Colonoscopy 2003 LS    DC/[place] Diverticulosis  . Eus 2008 DJ    FNA NO CARCINOID  . Eus JAN 2009    NL EXAM, NO Bx  . Upper gastrointestinal endoscopy APR 2008 [facility]    CARCIONOID  . Upper gastrointestinal endoscopy FEB 2011 [facility]    CARCINOID/mild anemia/large hiatal hernia  . Eus MAR 2011 [facility]    CARCINOID    Allergies  Allergen Reactions  . Codeine     Current Outpatient Prescriptions  Medication Sig Dispense Refill  . Acetaminophen (TYLENOL PO) Take by mouth as needed.        . calcium-vitamin D (OSCAL WITH D) 500-200 MG-UNIT per tablet Take 1 tablet by mouth daily.        . Cyanocobalamin (VITAMIN B-12 IJ) Inject 1,000 mcg as directed every 30 (thirty) days.        . diphenhydrAMINE (BENADRYL) 25 MG tablet Take 25 mg by mouth every 6 (six) hours as needed.        . diphenhydramine-acetaminophen (TYLENOL PM) 25-500 MG TABS Take 1 tablet by mouth at bedtime as needed.        . famotidine (PEPCID) 20 MG tablet Take 20 mg by mouth daily.       . felodipine (PLENDIL) 5 MG 24 hr tablet Take 5 mg by mouth daily.       . ferrous sulfate 325 (65 FE) MG tablet Take 325 mg by mouth daily with breakfast.        . simvastatin (ZOCOR) 40 MG tablet Take 40 mg by mouth at bedtime.        . terbinafine (LAMISIL AT) 1 % cream Apply topically 2 (two) times  daily.  30 g  0     Review of Systems     Objective:   Physical Exam  Vitals reviewed. Constitutional: She is oriented to person, place, and time. No distress.  HENT:  Head: Normocephalic and atraumatic.  Mouth/Throat: Oropharynx is clear and moist. No oropharyngeal exudate.  Eyes: Pupils are equal, round, and reactive to light. No scleral icterus.  Cardiovascular: Normal rate, regular rhythm and normal heart sounds.   Pulmonary/Chest: Effort normal and breath sounds normal. No respiratory distress.  Abdominal: Soft. Bowel sounds are normal. She exhibits no distension. There is no tenderness.  Musculoskeletal: She exhibits no edema.  Neurological: She is alert and oriented to person, place, and time.       NO  NEW FOCAL DEFICITS   Psychiatric: She has a normal mood and affect.          Assessment & Plan:

## 2011-07-11 NOTE — Assessment & Plan Note (Signed)
GETTING B12 SHOTS FROM DR. Kassie Mends.  FOLLOW UP IN 1 YEAR.

## 2011-07-11 NOTE — Assessment & Plan Note (Signed)
STAGE 1 GASTRIC CARCINOID EUS MAR 2011 NO RESIDUAL TUMOR. JAN 2013 HB 12.5  FOLLOW UP IN 1 YEAR.

## 2011-07-18 NOTE — Progress Notes (Signed)
Reminder in epic to follow up with SF in one year °

## 2011-07-24 ENCOUNTER — Ambulatory Visit (HOSPITAL_COMMUNITY): Payer: Medicare Other

## 2011-08-15 ENCOUNTER — Other Ambulatory Visit (HOSPITAL_COMMUNITY): Payer: Medicare Other

## 2011-08-16 ENCOUNTER — Encounter (HOSPITAL_BASED_OUTPATIENT_CLINIC_OR_DEPARTMENT_OTHER): Payer: Medicare Other

## 2011-08-16 ENCOUNTER — Encounter (HOSPITAL_COMMUNITY): Payer: Medicare Other | Attending: Oncology | Admitting: Oncology

## 2011-08-16 VITALS — BP 112/66 | HR 79 | Temp 98.0°F | Ht 61.0 in | Wt 120.9 lb

## 2011-08-16 DIAGNOSIS — D510 Vitamin B12 deficiency anemia due to intrinsic factor deficiency: Secondary | ICD-10-CM

## 2011-08-16 DIAGNOSIS — D3A092 Benign carcinoid tumor of the stomach: Secondary | ICD-10-CM | POA: Insufficient documentation

## 2011-08-16 DIAGNOSIS — C7A092 Malignant carcinoid tumor of the stomach: Secondary | ICD-10-CM

## 2011-08-16 LAB — COMPREHENSIVE METABOLIC PANEL
AST: 20 U/L (ref 0–37)
Albumin: 4 g/dL (ref 3.5–5.2)
BUN: 17 mg/dL (ref 6–23)
Calcium: 9.7 mg/dL (ref 8.4–10.5)
Creatinine, Ser: 1.02 mg/dL (ref 0.50–1.10)
Total Protein: 7.8 g/dL (ref 6.0–8.3)

## 2011-08-16 LAB — DIFFERENTIAL
Lymphocytes Relative: 27 % (ref 12–46)
Monocytes Absolute: 0.4 10*3/uL (ref 0.1–1.0)
Monocytes Relative: 7 % (ref 3–12)
Neutro Abs: 3.2 10*3/uL (ref 1.7–7.7)
Neutrophils Relative %: 65 % (ref 43–77)

## 2011-08-16 LAB — CBC
HCT: 39.4 % (ref 36.0–46.0)
MCH: 31.6 pg (ref 26.0–34.0)
MCHC: 32 g/dL (ref 30.0–36.0)
MCV: 98.7 fL (ref 78.0–100.0)
Platelets: 157 10*3/uL (ref 150–400)
RDW: 12.7 % (ref 11.5–15.5)
WBC: 4.9 10*3/uL (ref 4.0–10.5)

## 2011-08-16 MED ORDER — CYANOCOBALAMIN 1000 MCG/ML IJ SOLN
INTRAMUSCULAR | Status: AC
  Filled 2011-08-16: qty 1

## 2011-08-16 MED ORDER — CYANOCOBALAMIN 1000 MCG/ML IJ SOLN
1000.0000 ug | Freq: Once | INTRAMUSCULAR | Status: AC
  Administered 2011-08-16: 1000 ug via INTRAMUSCULAR

## 2011-08-16 NOTE — Progress Notes (Signed)
Labs drawn today for cbc/diff,cmp,serotonin 

## 2011-08-16 NOTE — Progress Notes (Signed)
[name] presents today for injection per MD orders. B12 1000mg  administered SQ in right Upper Arm. Administration without incident. Patient tolerated well.

## 2011-08-16 NOTE — Patient Instructions (Addendum)
[name]  [id number] 08/03/1922 Dr. Glenford Peers   [facility]  Discharge Instructions  RECOMMENDATIONS MADE BY THE CONSULTANT AND ANY TEST RESULTS WILL BE SENT TO YOUR REFERRING DOCTOR.   EXAM FINDINGS BY MD TODAY AND SIGNS AND SYMPTOMS TO REPORT TO CLINIC OR PRIMARY MD: If any problems with your labs we will call you.  Will continue B12 injections every 4 weeks.  MEDICATIONS PRESCRIBED: none   INSTRUCTIONS GIVEN AND DISCUSSED: Other :  Report increased fatigue or shortness of breath.  SPECIAL INSTRUCTIONS/FOLLOW-UP: Lab work Needed in 1 year and Return to Clinic in 1 year to see PA.   I acknowledge that I have been informed and understand all the instructions given to me and received a copy. I do not have any more questions at this time, but understand that I may call the Specialty Clinic at [facility] at [phone] during business hours should I have any further questions or need assistance in obtaining follow-up care.    __________________________________________  _____________  __________ Signature of Patient or Authorized Representative            Date                   Time    __________________________________________ Nurse's Signature

## 2011-08-16 NOTE — Progress Notes (Signed)
Problem #1 pernicious anemia presenting with anemia a high MCV of 123 methylmalonic acid level of 6600 and B12 level 142 I on monthly B12 and her labs are pending from today. Next time she is here we will check her labs and her urine. It stated in the chart that she was using iron every day but she brought all of her pills with her that she takes. She denies the use of any iron. Next year we will check her iron studies to play it safe. If there are any issues with her blood counts we will be in touch with her.  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

## 2011-09-13 ENCOUNTER — Encounter (HOSPITAL_COMMUNITY): Payer: Medicare Other | Attending: Oncology

## 2011-09-13 VITALS — BP 130/77 | HR 75

## 2011-09-13 DIAGNOSIS — D510 Vitamin B12 deficiency anemia due to intrinsic factor deficiency: Secondary | ICD-10-CM | POA: Insufficient documentation

## 2011-09-13 MED ORDER — CYANOCOBALAMIN 1000 MCG/ML IJ SOLN
INTRAMUSCULAR | Status: AC
  Filled 2011-09-13: qty 1

## 2011-09-13 MED ORDER — CYANOCOBALAMIN 1000 MCG/ML IJ SOLN
1000.0000 ug | Freq: Once | INTRAMUSCULAR | Status: AC
  Administered 2011-09-13: 1000 ug via INTRAMUSCULAR

## 2011-09-13 NOTE — Progress Notes (Signed)
Tessy L Hochstatter presents today for injection per MD orders. B12 1000mcg administered SQ in left Upper Arm. Administration without incident. Patient tolerated well.  

## 2011-09-17 ENCOUNTER — Other Ambulatory Visit (HOSPITAL_COMMUNITY): Payer: Self-pay | Admitting: Family Medicine

## 2011-09-17 DIAGNOSIS — Z139 Encounter for screening, unspecified: Secondary | ICD-10-CM

## 2011-10-14 ENCOUNTER — Encounter (HOSPITAL_COMMUNITY): Payer: Medicare Other | Attending: Oncology

## 2011-10-14 ENCOUNTER — Ambulatory Visit (HOSPITAL_COMMUNITY)
Admission: RE | Admit: 2011-10-14 | Discharge: 2011-10-14 | Disposition: A | Discharge status: Home / Self Care | Payer: Medicare Other | Source: Ambulatory Visit | Attending: Family Medicine | Admitting: Family Medicine

## 2011-10-14 VITALS — BP 133/67 | HR 85

## 2011-10-14 DIAGNOSIS — D510 Vitamin B12 deficiency anemia due to intrinsic factor deficiency: Secondary | ICD-10-CM | POA: Insufficient documentation

## 2011-10-14 DIAGNOSIS — Z1231 Encounter for screening mammogram for malignant neoplasm of breast: Secondary | ICD-10-CM | POA: Insufficient documentation

## 2011-10-14 DIAGNOSIS — Z139 Encounter for screening, unspecified: Secondary | ICD-10-CM

## 2011-10-14 MED ORDER — CYANOCOBALAMIN 1000 MCG/ML IJ SOLN
1000.0000 ug | Freq: Once | INTRAMUSCULAR | Status: AC
  Administered 2011-10-14: 1000 ug via INTRAMUSCULAR

## 2011-10-14 MED ORDER — CYANOCOBALAMIN 1000 MCG/ML IJ SOLN
INTRAMUSCULAR | Status: AC
  Filled 2011-10-14: qty 1

## 2011-10-14 NOTE — Progress Notes (Signed)
[name] presents today for injection per MD orders. B12 1000mcg administered SQ in left Upper Arm. Administration without incident. Patient tolerated well.  

## 2011-11-13 ENCOUNTER — Encounter (HOSPITAL_COMMUNITY): Payer: Medicare Other | Attending: Oncology

## 2011-11-13 VITALS — BP 140/51 | HR 72 | Temp 99.1°F | Resp 18

## 2011-11-13 DIAGNOSIS — D510 Vitamin B12 deficiency anemia due to intrinsic factor deficiency: Secondary | ICD-10-CM | POA: Insufficient documentation

## 2011-11-13 MED ORDER — CYANOCOBALAMIN 1000 MCG/ML IJ SOLN
1000.0000 ug | Freq: Once | INTRAMUSCULAR | Status: AC
  Administered 2011-11-13: 1000 ug via INTRAMUSCULAR

## 2011-11-13 MED ORDER — CYANOCOBALAMIN 1000 MCG/ML IJ SOLN
INTRAMUSCULAR | Status: AC
  Filled 2011-11-13: qty 1

## 2011-11-13 NOTE — Progress Notes (Signed)
[name] presents today for injection per the provider's orders.  Vitamin B12 administered administration without incident; see MAR for injection details.  Patient tolerated procedure well and without incident.  No questions or complaints noted at this time.

## 2011-12-13 ENCOUNTER — Encounter (HOSPITAL_COMMUNITY): Payer: Medicare Other | Attending: Oncology

## 2011-12-13 VITALS — BP 139/63 | HR 79

## 2011-12-13 DIAGNOSIS — D510 Vitamin B12 deficiency anemia due to intrinsic factor deficiency: Secondary | ICD-10-CM | POA: Insufficient documentation

## 2011-12-13 MED ORDER — CYANOCOBALAMIN 1000 MCG/ML IJ SOLN
INTRAMUSCULAR | Status: AC
  Filled 2011-12-13: qty 1

## 2011-12-13 MED ORDER — CYANOCOBALAMIN 1000 MCG/ML IJ SOLN
1000.0000 ug | Freq: Once | INTRAMUSCULAR | Status: AC
  Administered 2011-12-13: 1000 ug via INTRAMUSCULAR

## 2011-12-13 NOTE — Progress Notes (Signed)
Perley Jain presents today for injection per MD orders. B12 1000 mcg administered im in left Upper Arm. Administration without incident. Patient tolerated well.

## 2012-01-10 ENCOUNTER — Encounter (HOSPITAL_COMMUNITY): Payer: Medicare Other | Attending: Oncology

## 2012-01-10 DIAGNOSIS — D510 Vitamin B12 deficiency anemia due to intrinsic factor deficiency: Secondary | ICD-10-CM | POA: Insufficient documentation

## 2012-01-10 MED ORDER — CYANOCOBALAMIN 1000 MCG/ML IJ SOLN
1000.0000 ug | Freq: Once | INTRAMUSCULAR | Status: AC
  Administered 2012-01-10: 1000 ug via INTRAMUSCULAR

## 2012-01-10 MED ORDER — CYANOCOBALAMIN 1000 MCG/ML IJ SOLN
INTRAMUSCULAR | Status: AC
  Filled 2012-01-10: qty 1

## 2012-01-10 NOTE — Progress Notes (Signed)
[name] presents today for injection per MD orders. B12 1000mcg administered SQ in right Upper Arm. Administration without incident. Patient tolerated well.  

## 2012-01-14 ENCOUNTER — Emergency Department (HOSPITAL_COMMUNITY): Payer: Medicare Other

## 2012-01-14 ENCOUNTER — Emergency Department (HOSPITAL_COMMUNITY)
Admission: EM | Admit: 2012-01-14 | Discharge: 2012-01-14 | Disposition: A | Discharge status: Home / Self Care | Payer: Medicare Other | Attending: Emergency Medicine | Admitting: Emergency Medicine

## 2012-01-14 ENCOUNTER — Encounter (HOSPITAL_COMMUNITY): Payer: Self-pay | Admitting: Emergency Medicine

## 2012-01-14 DIAGNOSIS — Z85028 Personal history of other malignant neoplasm of stomach: Secondary | ICD-10-CM | POA: Insufficient documentation

## 2012-01-14 DIAGNOSIS — R350 Frequency of micturition: Secondary | ICD-10-CM | POA: Insufficient documentation

## 2012-01-14 DIAGNOSIS — E785 Hyperlipidemia, unspecified: Secondary | ICD-10-CM | POA: Insufficient documentation

## 2012-01-14 DIAGNOSIS — Z79899 Other long term (current) drug therapy: Secondary | ICD-10-CM | POA: Insufficient documentation

## 2012-01-14 DIAGNOSIS — R413 Other amnesia: Secondary | ICD-10-CM | POA: Insufficient documentation

## 2012-01-14 DIAGNOSIS — Z8719 Personal history of other diseases of the digestive system: Secondary | ICD-10-CM | POA: Insufficient documentation

## 2012-01-14 DIAGNOSIS — I10 Essential (primary) hypertension: Secondary | ICD-10-CM | POA: Insufficient documentation

## 2012-01-14 DIAGNOSIS — R42 Dizziness and giddiness: Secondary | ICD-10-CM | POA: Insufficient documentation

## 2012-01-14 DIAGNOSIS — R55 Syncope and collapse: Secondary | ICD-10-CM | POA: Insufficient documentation

## 2012-01-14 DIAGNOSIS — R5383 Other fatigue: Secondary | ICD-10-CM | POA: Insufficient documentation

## 2012-01-14 DIAGNOSIS — R5381 Other malaise: Secondary | ICD-10-CM | POA: Insufficient documentation

## 2012-01-14 DIAGNOSIS — N390 Urinary tract infection, site not specified: Secondary | ICD-10-CM | POA: Insufficient documentation

## 2012-01-14 DIAGNOSIS — R6883 Chills (without fever): Secondary | ICD-10-CM | POA: Insufficient documentation

## 2012-01-14 DIAGNOSIS — Z862 Personal history of diseases of the blood and blood-forming organs and certain disorders involving the immune mechanism: Secondary | ICD-10-CM | POA: Insufficient documentation

## 2012-01-14 DIAGNOSIS — Z8781 Personal history of (healed) traumatic fracture: Secondary | ICD-10-CM | POA: Insufficient documentation

## 2012-01-14 LAB — CBC WITH DIFFERENTIAL/PLATELET
Eosinophils Relative: 0 % (ref 0–5)
HCT: 41.9 % (ref 36.0–46.0)
Hemoglobin: 13.4 g/dL (ref 12.0–15.0)
Lymphocytes Relative: 12 % (ref 12–46)
Lymphs Abs: 1.2 10*3/uL (ref 0.7–4.0)
MCV: 98.6 fL (ref 78.0–100.0)
Monocytes Absolute: 0.6 10*3/uL (ref 0.1–1.0)
Monocytes Relative: 6 % (ref 3–12)
RBC: 4.25 MIL/uL (ref 3.87–5.11)
RDW: 12.2 % (ref 11.5–15.5)
WBC: 9.6 10*3/uL (ref 4.0–10.5)

## 2012-01-14 LAB — BASIC METABOLIC PANEL
CO2: 21 mEq/L (ref 19–32)
Calcium: 10.3 mg/dL (ref 8.4–10.5)
Chloride: 101 mEq/L (ref 96–112)
Creatinine, Ser: 1.22 mg/dL — ABNORMAL HIGH (ref 0.50–1.10)
Glucose, Bld: 96 mg/dL (ref 70–99)

## 2012-01-14 LAB — URINALYSIS, ROUTINE W REFLEX MICROSCOPIC
Glucose, UA: NEGATIVE mg/dL
Hgb urine dipstick: NEGATIVE
Ketones, ur: NEGATIVE mg/dL
Protein, ur: 30 mg/dL — AB
pH: 6.5 (ref 5.0–8.0)

## 2012-01-14 LAB — TROPONIN I: Troponin I: <0.3 ng/mL (ref <0.30)

## 2012-01-14 LAB — URINE MICROSCOPIC-ADD ON

## 2012-01-14 MED ORDER — SODIUM CHLORIDE 0.9 % IV SOLN: INTRAVENOUS | Status: DC

## 2012-01-14 MED ORDER — CEPHALEXIN 500 MG PO CAPS: 500.0000 mg | ORAL_CAPSULE | Freq: Four times a day (QID) | ORAL | Status: DC

## 2012-01-14 MED ORDER — SODIUM CHLORIDE 0.9 % IV SOLN
Freq: Once | INTRAVENOUS | Status: AC
  Administered 2012-01-14: 14:00:00 via INTRAVENOUS

## 2012-01-14 MED ORDER — DEXTROSE 5 % IV SOLN
1.0000 g | Freq: Once | INTRAVENOUS | Status: AC
  Administered 2012-01-14: 1 g via INTRAVENOUS
  Filled 2012-01-14: qty 10

## 2012-01-14 NOTE — ED Notes (Signed)
Dr. McManus at bedside,  

## 2012-01-14 NOTE — ED Notes (Signed)
Assisted pt to and from restroom, pt ambulated without difficulty.

## 2012-01-14 NOTE — ED Notes (Signed)
Pt states that she is feeling better, "ready to go home", comfort measures provided, pt and family updated on plan of care

## 2012-01-14 NOTE — ED Provider Notes (Signed)
History     CSN: [id number]  Arrival date & time 01/14/12  1311   First MD Initiated Contact with Patient 01/14/12 1328      Chief Complaint  Patient presents with  . Loss of Consciousness     HPI Pt was seen at 1355.  Per pt, c/o sudden onset and resolution of one episode of syncope that occurred PTA.  Pt states she was standing at [facility] and began to feel generalized weakness, lightheadedness, chills, followed by a syncopal episode. Pt states the store staff helped her into the bathroom where she had BM. States she has several BM's per day per her baseline and this was her normal BM today.  Pt also c/o urinary frequency for the past several days.  Denies CP/palpitations, no SOB/cough, no abd pain, no N/V/D, no fevers, no back pain, no black or blood in stools, no visual changes, no focal motor weakness, no tingling/numbness in extremities.    Past Medical History  Diagnosis Date  . Hiatal hernia   . HTN (hypertension)   . Hyperlipidemia   . Diverticulosis   . Carcinoid tumor of stomach 2008  . Adult BMI 19-24 kg/sq m 2008 125 lbs  . Anemia     Pernicious 2008-HB 12 MCV 119.5; JAN 2012 HB 13.2 MCV 97.6  . GERD (gastroesophageal reflux disease)   . Pernicious anemia   . Wrist fracture, left 2008  . Pernicious anemia 06/14/2010  . Mild memory loss 02/15/2011    Past Surgical History  Procedure Date  . Colonoscopy 2003 LS    DC/[place] Diverticulosis  . Eus 2008 DJ    FNA NO CARCINOID  . Eus JAN 2009    NL EXAM, NO Bx  . Upper gastrointestinal endoscopy APR 2008 SLF    CARCIONOID  . Upper gastrointestinal endoscopy FEB 2011 SLF    CARCINOID/mild anemia/large hiatal hernia  . Eus MAR 2011 WO    CARCINOID    Family History  Problem Relation Age of Onset  . Colon cancer Neg Hx   . Colon polyps Neg Hx     History  Substance Use Topics  . Smoking status: Never Smoker   . Smokeless tobacco: Not on file  . Alcohol Use: No    Review of Systems ROS: Statement: All  systems negative except as marked or noted in the HPI; Constitutional: Negative for fever and +chills, generalized weakness. ; ; Eyes: Negative for eye pain, redness and discharge. ; ; ENMT: Negative for ear pain, hoarseness, nasal congestion, sinus pressure and sore throat. ; ; Cardiovascular: Negative for chest pain, palpitations, diaphoresis, dyspnea and peripheral edema. ; ; Respiratory: Negative for cough, wheezing and stridor. ; ; Gastrointestinal: Negative for nausea, vomiting, diarrhea, abdominal pain, blood in stool, hematemesis, jaundice and rectal bleeding. . ; ; Genitourinary: +dysuria. Negative for flank pain and hematuria. ; ; Musculoskeletal: Negative for back pain and neck pain. Negative for swelling and trauma.; ; Skin: Negative for pruritus, rash, abrasions, blisters, bruising and skin lesion.; ; Neuro: Negative for headache and neck stiffness. Negative for altered mental status, extremity weakness, paresthesias, involuntary movement, seizure and +lightheadedness, syncope.      Allergies  Codeine  Home Medications   Current Outpatient Rx  Name  Route  Sig  Dispense  Refill  . ACETAMINOPHEN 500 MG PO TABS   Oral   Take 500 mg by mouth every 6 (six) hours as needed. For pain         . CALCIUM CARBONATE-VITAMIN D  500-200 MG-UNIT PO TABS   Oral   Take 1 tablet by mouth daily.           Marland Kitchen VITAMIN B-12 IJ   Injection   Inject 1,000 mcg as directed every 30 (thirty) days.           Marland Kitchen FAMOTIDINE 20 MG PO TABS   Oral   Take 20 mg by mouth daily.          Marland Kitchen FELODIPINE ER 5 MG PO TB24   Oral   Take 5 mg by mouth daily.          . MEGESTROL ACETATE 400 MG/10ML PO SUSP   Oral   Take 800 mg by mouth daily.         Marland Kitchen OVER THE COUNTER MEDICATION   Oral   Take 1 tablet by mouth 2 (two) times daily. OTC Allergy Medication         . SIMVASTATIN 40 MG PO TABS   Oral   Take 40 mg by mouth at bedtime.             BP 125/56  Pulse 82  Temp 98 F (36.7 C)  (Rectal)  Resp 20  SpO2 100%  Physical Exam 1400: Physical examination:  Nursing notes reviewed; Vital signs and O2 SAT reviewed;  Constitutional: Well developed, Well nourished, In no acute distress; Head:  Normocephalic, atraumatic; Eyes: EOMI, PERRL, No scleral icterus; ENMT: Mouth and pharynx normal, Mucous membranes dry; Neck: Supple, Full range of motion, No lymphadenopathy; Cardiovascular: Regular rate and rhythm, No gallop; Respiratory: Breath sounds clear & equal bilaterally, No rales, rhonchi, wheezes.  Speaking full sentences with ease, Normal respiratory effort/excursion; Chest: Nontender, Movement normal; Abdomen: Soft, Nontender, Nondistended, Normal bowel sounds; Genitourinary: No CVA tenderness; Extremities: Pulses normal, No tenderness, No edema, No calf edema or asymmetry.; Neuro: AA&Ox3, Major CN grossly intact.  Speech clear. No facial droop. Normal coordination. No gross focal motor or sensory deficits in extremities.; Skin: Color normal, Warm, Dry.   ED Course  Procedures     MDM  MDM Reviewed: nursing note, vitals and previous chart Reviewed previous: ECG and labs Interpretation: ECG, labs, x-ray and CT scan    Date: 01/14/2012  Rate: 93  Rhythm: normal sinus rhythm  QRS Axis: normal  Intervals: normal  ST/T Wave abnormalities: normal  Conduction Disutrbances:none  Narrative Interpretation:   Old EKG Reviewed: unchanged; no significant changes from previous EKG dated 04/12/2009.  Results for orders placed during the hospital encounter of 01/14/12  CBC WITH DIFFERENTIAL      Component Value Range   WBC 9.6  4.0 - 10.5 K/uL   RBC 4.25  3.87 - 5.11 MIL/uL   Hemoglobin 13.4  12.0 - 15.0 g/dL   HCT 96.0  45.4 - 09.8 %   MCV 98.6  78.0 - 100.0 fL   MCH 31.5  26.0 - 34.0 pg   MCHC 32.0  30.0 - 36.0 g/dL   RDW 11.9  14.7 - 82.9 %   Platelets 131 (*) 150 - 400 K/uL   Neutrophils Relative 82 (*) 43 - 77 %   Neutro Abs 7.9 (*) 1.7 - 7.7 K/uL   Lymphocytes  Relative 12  12 - 46 %   Lymphs Abs 1.2  0.7 - 4.0 K/uL   Monocytes Relative 6  3 - 12 %   Monocytes Absolute 0.6  0.1 - 1.0 K/uL   Eosinophils Relative 0  0 - 5 %   Eosinophils  Absolute 0.0  0.0 - 0.7 K/uL   Basophils Relative 0  0 - 1 %   Basophils Absolute 0.0  0.0 - 0.1 K/uL  BASIC METABOLIC PANEL      Component Value Range   Sodium 137  135 - 145 mEq/L   Potassium 3.5  3.5 - 5.1 mEq/L   Chloride 101  96 - 112 mEq/L   CO2 21  19 - 32 mEq/L   Glucose, Bld 96  70 - 99 mg/dL   BUN 18  6 - 23 mg/dL   Creatinine, Ser 1.19 (*) 0.50 - 1.10 mg/dL   Calcium 14.7  8.4 - 82.9 mg/dL   GFR calc non Af Amer 39 (*) >90 mL/min   GFR calc Af Amer 45 (*) >90 mL/min  URINALYSIS, ROUTINE W REFLEX MICROSCOPIC      Component Value Range   Color, Urine YELLOW  YELLOW   APPearance CLEAR  CLEAR   Specific Gravity, Urine 1.025  1.005 - 1.030   pH 6.5  5.0 - 8.0   Glucose, UA NEGATIVE  NEGATIVE mg/dL   Hgb urine dipstick NEGATIVE  NEGATIVE   Bilirubin Urine NEGATIVE  NEGATIVE   Ketones, ur NEGATIVE  NEGATIVE mg/dL   Protein, ur 30 (*) NEGATIVE mg/dL   Urobilinogen, UA 1.0  0.0 - 1.0 mg/dL   Nitrite NEGATIVE  NEGATIVE   Leukocytes, UA NEGATIVE  NEGATIVE  TROPONIN I      Component Value Range   Troponin I <0.30  <0.30 ng/mL  URINE MICROSCOPIC-ADD ON      Component Value Range   Squamous Epithelial / LPF MANY (*) RARE   WBC, UA 7-10  <3 WBC/hpf   Bacteria, UA MANY (*) RARE   Dg Chest 2 View 01/14/2012  *RADIOLOGY REPORT*  Clinical Data: Syncope  CHEST - 2 VIEW  Comparison:  October 13, 2006  Findings: There is no edema or consolidation.  The heart size and pulmonary vascularity are normal.  No adenopathy.  No bone lesions.  There is a large hiatal type hernia.  IMPRESSION: Large hiatal type hernia.  No edema or consolidation.   Original Report Authenticated By: Bretta Bang, M.D.    Ct Head Wo Contrast 01/14/2012  *RADIOLOGY REPORT*  Clinical Data: 78 year old female with dizziness,  near-syncope.  CT HEAD WITHOUT CONTRAST  Technique:  Contiguous axial images were obtained from the base of the skull through the vertex without contrast.  Comparison: 10/13/2006.  Findings: Minor left maxillary sinus mucous retention cyst.  Other visualized paranasal sinuses and mastoids are clear.  Visualized orbit soft tissues are within normal limits.  No acute osseous abnormality identified.  Visualized scalp soft tissues are within normal limits.  Calcified atherosclerosis at the skull base.  Cerebral volume is not significantly changed.  No ventriculomegaly. No midline shift, mass effect, or evidence of mass lesion.  No evidence of cortically based acute infarction identified.  No acute intracranial hemorrhage identified.  Minimal for age cerebral white matter hypodensity. No suspicious intracranial vascular hyperdensity.  IMPRESSION: Normal for age noncontrast CT appearance of the brain, stable since 2008.   Original Report Authenticated By: Erskine Speed, M.D.      Results for CRYSTLE, CARELLI (MRN [id number]) as of 01/14/2012 15:49  Ref. Range 08/07/2010 11:00 08/16/2011 10:45 01/14/2012 13:37  BUN Latest Range: 6-23 mg/dL 21 17 18   Creatinine Latest Range: 0.50-1.10 mg/dL 7.84 6.96 2.95 (H)      1615:  Cr elevated from previous.  HR increased during  orthostatic VS, but did not c/o lightheadedness.  Pt has ambulated to the bathroom with steady gait.  No N/V/D while in the ED.  +UTI, UC pending. Will give first dose of abx while in the ED.  Pt has told multiple staff members that she "feels better" and "is ready to go home now."  Pt and family (son) informed re: dx testing results, and that I recommend admission for further evaluation.  Pt refuses admission.  I encouraged pt to stay, continues to refuse.  Pt makes her own medical decisions.  Risks of AMA explained to pt and family, including, but not limited to:  stroke, heart attack, cardiac arrythmia ("irregular heart rate/beat"), "passing out,"  temporary and/or permanent disability, death.  Pt and family verb understanding and continue to refuse admission, understanding the consequences of their decision.  I encouraged pt to follow up with her PMD after the holiday and return to the ED immediately if symptoms return, or for any other concerns.  Pt and family verb understanding, agreeable.  Pt's son states she will stay with him tonight.  Pt is agreeable to receive IVF and dose of abx now.              Laray Anger, DO 01/17/12 [phone]

## 2012-01-14 NOTE — ED Notes (Signed)
MD at bedside. 

## 2012-01-14 NOTE — ED Notes (Signed)
Pt c/o suddenly feeling weak/dizzy/chills x 1 hour ago while at [facility]. Near syncopal episode-pt assisted to restroom by staff. Denies ha/cp. nad noted.

## 2012-01-14 NOTE — ED Notes (Signed)
Patient is resting comfortably. 

## 2012-01-14 NOTE — ED Notes (Signed)
Pt reports was at [facility] and had sudden onset of dizziness, lightheadedness, and generalized weakness.  Says staff assisted pt to restroom.  Reports had loose BM then started feeling better.  PT says feels normal now.  Denies any pain.  Pt alert and oriented.

## 2012-01-17 LAB — URINE CULTURE: Colony Count: >=100000

## 2012-01-18 NOTE — ED Notes (Signed)
+  Urine. Patient treated with Keflex. Sensitive to same. Per protocol MD. °

## 2012-02-14 ENCOUNTER — Encounter (HOSPITAL_COMMUNITY): Payer: Medicare Other | Attending: Oncology

## 2012-02-14 VITALS — BP 138/67 | HR 74

## 2012-02-14 DIAGNOSIS — D510 Vitamin B12 deficiency anemia due to intrinsic factor deficiency: Secondary | ICD-10-CM | POA: Insufficient documentation

## 2012-02-14 MED ORDER — CYANOCOBALAMIN 1000 MCG/ML IJ SOLN
INTRAMUSCULAR | Status: AC
  Filled 2012-02-14: qty 1

## 2012-02-14 MED ORDER — CYANOCOBALAMIN 1000 MCG/ML IJ SOLN
1000.0000 ug | Freq: Once | INTRAMUSCULAR | Status: AC
  Administered 2012-02-14: 1000 ug via INTRAMUSCULAR

## 2012-02-14 NOTE — Progress Notes (Signed)
Lenda L Pam presents today for injection per MD orders. B12 1000mcg administered SQ in right Upper Arm. Administration without incident. Patient tolerated well.  

## 2012-03-16 ENCOUNTER — Encounter (HOSPITAL_COMMUNITY): Payer: Medicare Other | Attending: Oncology

## 2012-03-16 VITALS — BP 151/61 | HR 96

## 2012-03-16 DIAGNOSIS — D510 Vitamin B12 deficiency anemia due to intrinsic factor deficiency: Secondary | ICD-10-CM | POA: Insufficient documentation

## 2012-03-16 MED ORDER — CYANOCOBALAMIN 1000 MCG/ML IJ SOLN
INTRAMUSCULAR | Status: AC
  Filled 2012-03-16: qty 1

## 2012-03-16 MED ORDER — CYANOCOBALAMIN 1000 MCG/ML IJ SOLN
1000.0000 ug | Freq: Once | INTRAMUSCULAR | Status: AC
  Administered 2012-03-16: 1000 ug via INTRAMUSCULAR

## 2012-03-16 NOTE — Progress Notes (Signed)
[name] presents today for injection per MD orders. B12 1000mcg administered SQ in left Upper Arm. Administration without incident. Patient tolerated well.  

## 2012-04-13 ENCOUNTER — Encounter (HOSPITAL_COMMUNITY): Payer: Medicare Other | Attending: Oncology

## 2012-04-13 VITALS — BP 133/56 | HR 98 | Temp 98.1°F

## 2012-04-13 DIAGNOSIS — D510 Vitamin B12 deficiency anemia due to intrinsic factor deficiency: Secondary | ICD-10-CM | POA: Insufficient documentation

## 2012-04-13 MED ORDER — CYANOCOBALAMIN 1000 MCG/ML IJ SOLN
INTRAMUSCULAR | Status: AC
  Filled 2012-04-13: qty 1

## 2012-04-13 MED ORDER — CYANOCOBALAMIN 1000 MCG/ML IJ SOLN
1000.0000 ug | Freq: Once | INTRAMUSCULAR | Status: AC
  Administered 2012-04-13: 1000 ug via INTRAMUSCULAR

## 2012-04-13 NOTE — Progress Notes (Signed)
[name] presents today for injection per MD orders. B12 1000mcg administered SQ in left Upper Arm. Administration without incident. Patient tolerated well.  

## 2012-05-14 ENCOUNTER — Encounter (HOSPITAL_COMMUNITY): Payer: Medicare Other | Attending: Oncology

## 2012-05-14 VITALS — BP 154/75 | HR 84

## 2012-05-14 DIAGNOSIS — D510 Vitamin B12 deficiency anemia due to intrinsic factor deficiency: Secondary | ICD-10-CM

## 2012-05-14 MED ORDER — CYANOCOBALAMIN 1000 MCG/ML IJ SOLN
1000.0000 ug | Freq: Once | INTRAMUSCULAR | Status: AC
  Administered 2012-05-14: 1000 ug via INTRAMUSCULAR

## 2012-05-14 MED ORDER — CYANOCOBALAMIN 1000 MCG/ML IJ SOLN
INTRAMUSCULAR | Status: AC
  Filled 2012-05-14: qty 1

## 2012-05-14 NOTE — Progress Notes (Signed)
[name] presents today for injection per MD orders. B12 1000 mcg administered im in right Upper Arm. Administration without incident. Patient tolerated well.

## 2012-06-11 ENCOUNTER — Encounter (HOSPITAL_COMMUNITY): Payer: Medicare Other | Attending: Oncology

## 2012-06-11 DIAGNOSIS — D510 Vitamin B12 deficiency anemia due to intrinsic factor deficiency: Secondary | ICD-10-CM | POA: Insufficient documentation

## 2012-06-11 MED ORDER — CYANOCOBALAMIN 1000 MCG/ML IJ SOLN
1000.0000 ug | Freq: Once | INTRAMUSCULAR | Status: AC
  Administered 2012-06-11: 1000 ug via INTRAMUSCULAR

## 2012-06-11 MED ORDER — CYANOCOBALAMIN 1000 MCG/ML IJ SOLN
INTRAMUSCULAR | Status: AC
  Filled 2012-06-11: qty 1

## 2012-06-11 NOTE — Progress Notes (Signed)
[name] presents today for injection per MD orders. B12 1000 administered SQ in right Upper Arm. Administration without incident. Patient tolerated well.

## 2012-06-24 ENCOUNTER — Encounter: Payer: Self-pay | Admitting: Gastroenterology

## 2012-07-08 ENCOUNTER — Encounter: Payer: Self-pay | Admitting: Gastroenterology

## 2012-07-13 ENCOUNTER — Ambulatory Visit: Payer: Medicare Other | Admitting: Gastroenterology

## 2012-07-16 ENCOUNTER — Encounter (HOSPITAL_COMMUNITY): Payer: Medicare Other | Attending: Oncology

## 2012-07-16 VITALS — BP 141/64 | HR 86

## 2012-07-16 DIAGNOSIS — D510 Vitamin B12 deficiency anemia due to intrinsic factor deficiency: Secondary | ICD-10-CM

## 2012-07-16 MED ORDER — CYANOCOBALAMIN 1000 MCG/ML IJ SOLN
INTRAMUSCULAR | Status: AC
  Filled 2012-07-16: qty 1

## 2012-07-16 MED ORDER — CYANOCOBALAMIN 1000 MCG/ML IJ SOLN
1000.0000 ug | Freq: Once | INTRAMUSCULAR | Status: AC
  Administered 2012-07-16: 1000 ug via INTRAMUSCULAR

## 2012-07-16 NOTE — Progress Notes (Signed)
[name] presents today for injection per MD orders. B12 1000 units administered SQ in right Upper Arm. Administration without incident. Patient tolerated well.

## 2012-08-07 ENCOUNTER — Encounter (HOSPITAL_COMMUNITY): Payer: Medicare Other | Attending: Oncology

## 2012-08-07 DIAGNOSIS — D3A092 Benign carcinoid tumor of the stomach: Secondary | ICD-10-CM | POA: Insufficient documentation

## 2012-08-07 DIAGNOSIS — D510 Vitamin B12 deficiency anemia due to intrinsic factor deficiency: Secondary | ICD-10-CM | POA: Insufficient documentation

## 2012-08-07 LAB — IRON AND TIBC
Saturation Ratios: 19 % — ABNORMAL LOW (ref 20–55)
UIBC: 264 ug/dL (ref 125–400)

## 2012-08-07 LAB — CBC WITH DIFFERENTIAL/PLATELET
Basophils Absolute: 0 10*3/uL (ref 0.0–0.1)
Eosinophils Relative: 1 % (ref 0–5)
HCT: 39.4 % (ref 36.0–46.0)
Hemoglobin: 12.7 g/dL (ref 12.0–15.0)
Lymphocytes Relative: 19 % (ref 12–46)
MCV: 98 fL (ref 78.0–100.0)
Monocytes Absolute: 0.5 10*3/uL (ref 0.1–1.0)
Monocytes Relative: 7 % (ref 3–12)
Neutro Abs: 5.2 10*3/uL (ref 1.7–7.7)
RDW: 12.5 % (ref 11.5–15.5)
WBC: 7.1 10*3/uL (ref 4.0–10.5)

## 2012-08-07 LAB — FERRITIN: Ferritin: 25 ng/mL (ref 10–291)

## 2012-08-13 ENCOUNTER — Ambulatory Visit (HOSPITAL_COMMUNITY): Payer: Medicare Other

## 2012-08-13 LAB — SEROTONIN SERUM: Serotonin, Serum: 85 ng/mL (ref 56–244)

## 2012-08-14 ENCOUNTER — Encounter (HOSPITAL_COMMUNITY): Payer: Self-pay | Admitting: Oncology

## 2012-08-14 ENCOUNTER — Encounter (HOSPITAL_BASED_OUTPATIENT_CLINIC_OR_DEPARTMENT_OTHER): Payer: Medicare Other | Admitting: Oncology

## 2012-08-14 ENCOUNTER — Encounter (HOSPITAL_COMMUNITY): Payer: Medicare Other

## 2012-08-14 VITALS — BP 161/71 | HR 78 | Temp 98.2°F | Resp 18 | Wt 117.2 lb

## 2012-08-14 DIAGNOSIS — D510 Vitamin B12 deficiency anemia due to intrinsic factor deficiency: Secondary | ICD-10-CM

## 2012-08-14 DIAGNOSIS — D3A092 Benign carcinoid tumor of the stomach: Secondary | ICD-10-CM

## 2012-08-14 MED ORDER — CYANOCOBALAMIN 1000 MCG/ML IJ SOLN
INTRAMUSCULAR | Status: AC
  Filled 2012-08-14: qty 1

## 2012-08-14 MED ORDER — CYANOCOBALAMIN 1000 MCG/ML IJ SOLN
1000.0000 ug | Freq: Once | INTRAMUSCULAR | Status: AC
  Administered 2012-08-14: 1000 ug via INTRAMUSCULAR

## 2012-08-14 NOTE — Progress Notes (Signed)
[name], MD [address] [place] [address]  Pernicious anemia - Plan: SCHEDULING COMMUNICATION INJECTION, cyanocobalamin ((VITAMIN B-12)) injection 1,000 mcg  CURRENT THERAPY: Monthly B12 injections.  Observation via labs of neuroendocrine carcinoid tumor of the stomach   INTERVAL HISTORY: [name] 79 y.o. 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  Serum serotonin is well WNL.  Her iron studies are solid.  CBC is WNL with a normal Hgb and MCV.   She denies any complaints.  She has already received her B12 injection prior to my entrance into the examination room.   Her appetite is strong.  She continues to drive herself and it sounds as though she is very active at home performing household chores and outdoor gardening.    Hematologically and oncologically, the patient denies any complaints and ROS questioning is negative.  She denies any signs and symptoms of serotonin syndrome.   Her weight continues to decline, but she is asymptomatic.   Past Medical History  Diagnosis Date  . Hiatal hernia   . HTN (hypertension)   . Hyperlipidemia   . Diverticulosis   . Carcinoid tumor of stomach 2008  . Adult BMI 19-24 kg/sq m 2008 125 lbs  . Anemia     Pernicious 2008-HB 12 MCV 119.5; JAN 2012 HB 13.2 MCV 97.6  . GERD (gastroesophageal reflux disease)   . Pernicious anemia   . Wrist fracture, left 2008  . Pernicious anemia 06/14/2010  . Mild memory loss 02/15/2011    has Neuroendocrine carcinoid tumor of stomach; DM; HYPERLIPIDEMIA; HYPERTENSION; GERD; HEMOCCULT POSITIVE STOOL; WEAKNESS; Pernicious anemia; and Mild memory loss on her problem list.     is allergic to codeine.  We administered cyanocobalamin.  Past Surgical  History  Procedure Laterality Date  . Colonoscopy  2003 LS    DC/[place] Diverticulosis  . Eus  2008 DJ    FNA NO CARCINOID  . Eus  JAN 2009    NL EXAM, NO Bx  . Upper gastrointestinal endoscopy  APR 2008 SLF    CARCIONOID  . Upper gastrointestinal endoscopy  FEB 2011 SLF    CARCINOID/mild anemia/large hiatal hernia  . Eus  MAR 2011 WO    CARCINOID    Denies any headaches, dizziness, double vision, fevers, chills, night sweats, nausea, vomiting, diarrhea, constipation, chest pain, heart palpitations, shortness of breath, blood in stool, black tarry stool, urinary pain, urinary burning, urinary frequency, hematuria.   PHYSICAL EXAMINATION  ECOG PERFORMANCE STATUS: 1 - Symptomatic but completely ambulatory  Filed Vitals:   08/14/12 1021  BP: 161/71  Pulse: 78  Temp: 98.2 F (36.8 C)  Resp: 18    GENERAL:alert, no distress, well nourished, well developed, comfortable, cooperative, smiling and appears younger than her stated age. SKIN: skin color, texture, turgor are normal, no rashes or significant lesions HEAD: Normocephalic, No masses, lesions, tenderness or abnormalities EYES: normal, PERRLA, EOMI, Conjunctiva are pink and non-injected EARS: External ears normal OROPHARYNX:mucous membranes are moist  NECK: supple, no adenopathy, thyroid normal size, non-tender, without nodularity, no stridor, non-tender, trachea midline LYMPH:  no palpable lymphadenopathy, no hepatosplenomegaly BREAST:not examined LUNGS: clear to auscultation and percussion HEART: regular rate & rhythm, no murmurs, no gallops, S1 normal and S2 normal  ABDOMEN:abdomen soft, non-tender, normal bowel sounds, no masses or organomegaly and no hepatosplenomegaly BACK: Back symmetric, no curvature., No CVA tenderness EXTREMITIES:less then 2 second capillary refill, no joint deformities, effusion, or inflammation, no edema, no skin discoloration, no clubbing, no cyanosis  NEURO: alert & oriented x 3 with fluent speech,  no focal motor/sensory deficits, gait normal    LABORATORY DATA: CBC    Component Value Date/Time   WBC 7.1 08/07/2012 0824   RBC 4.02 08/07/2012 0824   HGB 12.7 08/07/2012 0824   HCT 39.4 08/07/2012 0824   PLT 169 08/07/2012 0824   MCV 98.0 08/07/2012 0824   MCH 31.6 08/07/2012 0824   MCHC 32.2 08/07/2012 0824   RDW 12.5 08/07/2012 0824   LYMPHSABS 1.4 08/07/2012 0824   MONOABS 0.5 08/07/2012 0824   EOSABS 0.1 08/07/2012 0824   BASOSABS 0.0 08/07/2012 0824      Chemistry      Component Value Date/Time   NA 137 01/14/2012 1337   K 3.5 01/14/2012 1337   CL 101 01/14/2012 1337   CO2 21 01/14/2012 1337   BUN 18 01/14/2012 1337   CREATININE 1.22* 01/14/2012 1337      Component Value Date/Time   CALCIUM 10.3 01/14/2012 1337   ALKPHOS 96 08/16/2011 1045   AST 20 08/16/2011 1045   ALT 7 08/16/2011 1045   BILITOT 0.5 08/16/2011 1045     Results for MALIA, CORSI (MRN [id number]) as of 08/14/2012 10:51  Ref. Range 08/07/2012 08:24  Serotonin, Serum Latest Range: 56-244 ng/mL 85    Lab Results  Component Value Date   IRON 63 08/07/2012   TIBC 327 08/07/2012   FERRITIN 25 08/07/2012     ASSESSMENT:  1. Pernicious anemia, receiving monthly B12 injections with normalization of CBC. 2. 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 Declining weight over the past 2 years.  Asymptomatic.  Patient Active Problem List   Diagnosis Date Noted  . 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 Pernicious anemia 06/14/2010  . 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 DM 04/03/2009  . HYPERLIPIDEMIA 04/03/2009  . HYPERTENSION 04/03/2009  . GERD 04/03/2009  . HEMOCCULT POSITIVE STOOL 04/03/2009  . WEAKNESS 04/03/2009     PLAN:  1. I personally reviewed and went over laboratory results with the patient. 2. Labs in 6 months: CBC diff, CMET, serum serotonin, 24 hr urine collection for CrCl and 5-HIAA 3. Labs in 12  months: CBC diff, CMET, serum serotonin 4. Continue monthly B12 injections 5. Return in 6 months for follow-up   THERAPY PLAN:  She is doing very well from our standpoint and we will continue to administer monthly B12 injections and monitor lab work.  We will see her back in 1 year.   All questions were answered. The patient knows to call the clinic with any problems, questions or concerns. We can certainly see the patient much sooner if necessary.  Patient and plan discussed with Dr. Erline Hau and he is in agreement with the aforementioned.   More than 50% of the time spent with the patient was utilized for counseling and coordination of care.   Westley Blass

## 2012-08-14 NOTE — Patient Instructions (Addendum)
.  [facility] Discharge Instructions  RECOMMENDATIONS MADE BY THE CONSULTANT AND ANY TEST RESULTS WILL BE SENT TO YOUR REFERRING PHYSICIAN.  EXAM FINDINGS BY THE PHYSICIAN TODAY AND SIGNS OR SYMPTOMS TO REPORT TO CLINIC OR PRIMARY PHYSICIAN:    INSTRUCTIONS GIVEN AND DISCUSSED: Labs in 6 months and urine collection  SPECIAL INSTRUCTIONS/FOLLOW-UP: Return in one year to see the PA  Thank you for choosing [facility] to provide your oncology and hematology care.  To afford each patient quality time with our providers, please arrive at least 15 minutes before your scheduled appointment time.  With your help, our goal is to use those 15 minutes to complete the necessary work-up to ensure our physicians have the information they need to help with your evaluation and healthcare recommendations.    Effective January 1st, 2014, we ask that you re-schedule your appointment with our physicians should you arrive 10 or more minutes late for your appointment.  We strive to give you quality time with our providers, and arriving late affects you and other patients whose appointments are after yours.    Again, thank you for choosing [facility].  Our hope is that these requests will decrease the amount of time that you wait before being seen by our physicians.       _____________________________________________________________  Should you have questions after your visit to [facility], please contact our office at [phone] between the hours of 8:30 a.m. and 5:00 p.m.  Voicemails left after 4:30 p.m. will not be returned until the following business day.  For prescription refill requests, have your pharmacy contact our office with your prescription refill request.

## 2012-08-14 NOTE — Progress Notes (Signed)
[name] presents today for injection per MD orders. B12 1000 mcg administered IM in right Upper Arm. Administration without incident. Patient tolerated well.  

## 2012-08-17 ENCOUNTER — Telehealth: Payer: Self-pay | Admitting: Gastroenterology

## 2012-08-17 NOTE — Telephone Encounter (Signed)
Declined to see pt at this time

## 2012-08-20 ENCOUNTER — Ambulatory Visit (INDEPENDENT_AMBULATORY_CARE_PROVIDER_SITE_OTHER): Payer: Medicare Other | Admitting: Gastroenterology

## 2012-08-20 ENCOUNTER — Telehealth: Payer: Self-pay

## 2012-08-20 VITALS — BP 122/67 | HR 87 | Temp 97.4°F | Ht 62.0 in | Wt 117.6 lb

## 2012-08-20 DIAGNOSIS — R634 Abnormal weight loss: Secondary | ICD-10-CM

## 2012-08-20 DIAGNOSIS — R197 Diarrhea, unspecified: Secondary | ICD-10-CM | POA: Insufficient documentation

## 2012-08-20 NOTE — Assessment & Plan Note (Addendum)
CHRONIC INTERMITTENT, MOST LIKLEY DUE TO LACTOSE INTOLERNACE  ADD LACTASE 1-2 WITH MEALS.  TAKE A PROBIOTIC DAILY FOR ONE MONTH (KMART BRAND, PH[employer]ON HEALTH, ALIGN).  CALL IN ONE MO IF SX NOT IMPROVED AFTER 1 MO. WILL TRY PANCREATIC ENZYMES IF SX IMPROVED BUT NOT RESOLVED OR NOT IMPROVED AT ALL. CHECK C DIFF PCR AND GIARDIA AG OPV IN 3 MOS.

## 2012-08-20 NOTE — Patient Instructions (Addendum)
ADD BOOST OR ENSURE 2 HRS AFTER BREAKFAST AND LUNCH.  SUBMIT STOOL STUDIES.  ADD LACTASE 1-2 WITH MEALS.   TAKE A PROBIOTIC DAILY (KMART BRAND, [employer], ALIGN).   CALL IN ONE MO IF DIARRHEA NOT IMPROVED AFTER 1 MO.   FOLLOW UP IN 3 MOS.

## 2012-08-20 NOTE — Assessment & Plan Note (Signed)
ADD ENSURE OR BOOST BID BETWEEN MEALS.

## 2012-08-20 NOTE — Telephone Encounter (Signed)
I called pt and no answer. Called son and left VM that pt needs to do stool studies and come by office to pick up containers. Orders faxed to [facility] and orders also included in the bag at the front. LVM that we do close at noon on Fridays.

## 2012-08-20 NOTE — Progress Notes (Signed)
  Subjective:    Patient ID: [name], female    DOB: 1923/01/13, 79 y.o.   MRN: [id number] [name], MD  HPI Appetite: good. GETS COLD. MAY HAVE REFLUX:1X/WK(GAGGING) . NO HAs. LEGS HURT. ANKLES SWELL. DIARRHEA AFTER SHE EATS SOME THINGS. CAN'T PINPOINT DEFINITE CAUSES. DRINK FLAVORED WATER. LOST 7 LBS SINCE LAST YEAR. NOT OFTEN DOES SHE HAVE DIARRHEA.   PT DENIES FEVER, CHILLS, BRBPR, nausea, vomiting, melena, constipation, abd pain, problems swallowing, OR heartburn.   Past Medical History  Diagnosis Date  . Hiatal hernia   . HTN (hypertension)   . Hyperlipidemia   . Diverticulosis   . Carcinoid tumor of stomach 2008  . Adult BMI 19-24 kg/sq m 2008 125 lbs  . Anemia     Pernicious 2008-HB 12 MCV 119.5; JAN 2012 HB 13.2 MCV 97.6  . GERD (gastroesophageal reflux disease)   . Pernicious anemia   . Wrist fracture, left 2008  . Pernicious anemia 06/14/2010  . Mild memory loss 02/15/2011   Past Surgical History  Procedure Laterality Date  . Colonoscopy  2003 LS    DC/[place] Diverticulosis  . Eus  2008 DJ    FNA NO CARCINOID  . Eus  JAN 2009    NL EXAM, NO Bx  . Upper gastrointestinal endoscopy  APR 2008 SLF    CARCIONOID  . Upper gastrointestinal endoscopy  FEB 2011 SLF    CARCINOID/mild anemia/large hiatal hernia  . Eus  MAR 2011 WO    CARCINOID      Review of Systems     Objective:   Physical Exam  Vitals reviewed. Constitutional: She is oriented to person, place, and time. She appears well-nourished.  HENT:  Head: Normocephalic and atraumatic.  Mouth/Throat: Oropharynx is clear and moist. No oropharyngeal exudate.  Eyes: Pupils are equal, round, and reactive to light. No scleral icterus.  Neck: Normal range of motion. Neck supple.  Cardiovascular: Normal rate, regular rhythm and normal heart sounds.   Pulmonary/Chest: Effort normal and breath sounds normal. No respiratory distress.  Abdominal: Soft. Bowel sounds are normal. She exhibits no distension.  There is no tenderness.  Musculoskeletal: Normal range of motion. She exhibits no edema.  Lymphadenopathy:    She has no cervical adenopathy.  Neurological: She is alert and oriented to person, place, and time.  NO FOCAL DEFICITS   Psychiatric: She has a normal mood and affect.          Assessment & Plan:

## 2012-08-20 NOTE — Addendum Note (Signed)
Addended by: [place] on: 08/20/2012 02:20 PM   Modules accepted: Orders

## 2012-08-21 NOTE — Telephone Encounter (Signed)
Stool bottle were picked up today.

## 2012-08-21 NOTE — Progress Notes (Signed)
Reminder in epic °

## 2012-08-26 LAB — CLOSTRIDIUM DIFFICILE BY PCR: Toxigenic C. Difficile by PCR: NOT DETECTED

## 2012-08-26 LAB — GIARDIA ANTIGEN: Giardia Screen (EIA): NEGATIVE

## 2012-08-26 NOTE — Progress Notes (Addendum)
PLEASE CALL PT. HER STOOL STUDIES ARE NEGATIVE.  ADD BOOST OR ENSURE 2 HRS AFTER BREAKFAST AND LUNCH.  ADD LACTASE 1-2 WITH MEALS.   TAKE A PROBIOTIC DAILY ([employer] BRAND, PH[employer]S COLON HEALTH, ALIGN).  CALL IN ONE MO IF DIARRHEA NOT IMPROVED.   FOLLOW UP IN 3 MOS.

## 2012-08-27 NOTE — Progress Notes (Signed)
Cc PCP 

## 2012-09-01 NOTE — Progress Notes (Signed)
Called. Many rings and no answer.  

## 2012-09-04 NOTE — Progress Notes (Signed)
Called. Many rings and no answer. Mailing a letter to call.

## 2012-09-08 NOTE — Progress Notes (Signed)
Pt called and was informed.  

## 2012-09-11 ENCOUNTER — Other Ambulatory Visit (HOSPITAL_COMMUNITY): Payer: Self-pay | Admitting: Family Medicine

## 2012-09-11 ENCOUNTER — Encounter (HOSPITAL_COMMUNITY): Payer: Medicare Other | Attending: Oncology

## 2012-09-11 DIAGNOSIS — D510 Vitamin B12 deficiency anemia due to intrinsic factor deficiency: Secondary | ICD-10-CM | POA: Insufficient documentation

## 2012-09-11 DIAGNOSIS — Z139 Encounter for screening, unspecified: Secondary | ICD-10-CM

## 2012-09-11 MED ORDER — CYANOCOBALAMIN 1000 MCG/ML IJ SOLN
1000.0000 ug | Freq: Once | INTRAMUSCULAR | Status: AC
  Administered 2012-09-11: 1000 ug via INTRAMUSCULAR

## 2012-09-11 MED ORDER — CYANOCOBALAMIN 1000 MCG/ML IJ SOLN
INTRAMUSCULAR | Status: AC
  Filled 2012-09-11: qty 1

## 2012-09-11 NOTE — Progress Notes (Signed)
[name] presents today for injection per MD orders. B12 1000mcg administered SQ in left Upper Arm. Administration without incident. Patient tolerated well.  

## 2012-10-16 ENCOUNTER — Ambulatory Visit (HOSPITAL_COMMUNITY)
Admission: RE | Admit: 2012-10-16 | Discharge: 2012-10-16 | Disposition: A | Discharge status: Home / Self Care | Payer: Medicare Other | Source: Ambulatory Visit | Attending: Family Medicine | Admitting: Family Medicine

## 2012-10-16 ENCOUNTER — Encounter (HOSPITAL_COMMUNITY): Payer: Medicare Other | Attending: Oncology

## 2012-10-16 VITALS — BP 134/54 | HR 85 | Temp 98.2°F | Resp 16

## 2012-10-16 DIAGNOSIS — Z139 Encounter for screening, unspecified: Secondary | ICD-10-CM

## 2012-10-16 DIAGNOSIS — Z1231 Encounter for screening mammogram for malignant neoplasm of breast: Secondary | ICD-10-CM | POA: Insufficient documentation

## 2012-10-16 DIAGNOSIS — D510 Vitamin B12 deficiency anemia due to intrinsic factor deficiency: Secondary | ICD-10-CM

## 2012-10-16 MED ORDER — CYANOCOBALAMIN 1000 MCG/ML IJ SOLN
INTRAMUSCULAR | Status: AC
  Filled 2012-10-16: qty 1

## 2012-10-16 MED ORDER — CYANOCOBALAMIN 1000 MCG/ML IJ SOLN
1000.0000 ug | Freq: Once | INTRAMUSCULAR | Status: AC
  Administered 2012-10-16: 1000 ug via INTRAMUSCULAR

## 2012-10-16 NOTE — Progress Notes (Signed)
Vitamin B-12 1000 mcg given sub-q to left deltoid.  Tolerated well.

## 2012-11-13 ENCOUNTER — Encounter (HOSPITAL_COMMUNITY): Payer: Medicare Other | Attending: Oncology

## 2012-11-13 VITALS — BP 142/67 | HR 97 | Temp 97.9°F | Resp 16

## 2012-11-13 DIAGNOSIS — D510 Vitamin B12 deficiency anemia due to intrinsic factor deficiency: Secondary | ICD-10-CM | POA: Insufficient documentation

## 2012-11-13 MED ORDER — CYANOCOBALAMIN 1000 MCG/ML IJ SOLN
1000.0000 ug | Freq: Once | INTRAMUSCULAR | Status: AC
  Administered 2012-11-13: 1000 ug via INTRAMUSCULAR

## 2012-11-13 MED ORDER — CYANOCOBALAMIN 1000 MCG/ML IJ SOLN
INTRAMUSCULAR | Status: AC
  Filled 2012-11-13: qty 1

## 2012-11-13 NOTE — Progress Notes (Signed)
Vitamin B-12 1000 mcg given IM Z-track to left upper arm.  Tolerated well.

## 2012-12-16 ENCOUNTER — Encounter (HOSPITAL_COMMUNITY): Payer: Medicare Other | Attending: Oncology

## 2012-12-16 VITALS — BP 128/56 | HR 98

## 2012-12-16 DIAGNOSIS — D510 Vitamin B12 deficiency anemia due to intrinsic factor deficiency: Secondary | ICD-10-CM

## 2012-12-16 MED ORDER — CYANOCOBALAMIN 1000 MCG/ML IJ SOLN
1000.0000 ug | Freq: Once | INTRAMUSCULAR | Status: AC
  Administered 2012-12-16: 1000 ug via INTRAMUSCULAR

## 2012-12-16 MED ORDER — CYANOCOBALAMIN 1000 MCG/ML IJ SOLN
INTRAMUSCULAR | Status: AC
  Filled 2012-12-16: qty 1

## 2012-12-16 NOTE — Progress Notes (Signed)
[name] presents today for injection per MD orders. B12 !000 mcg administered SQ in left Upper Arm. Administration without incident. Patient tolerated well.

## 2013-01-13 ENCOUNTER — Encounter (HOSPITAL_COMMUNITY): Payer: Medicare Other | Attending: Oncology

## 2013-01-13 VITALS — BP 112/54 | HR 84 | Resp 16

## 2013-01-13 DIAGNOSIS — D510 Vitamin B12 deficiency anemia due to intrinsic factor deficiency: Secondary | ICD-10-CM | POA: Insufficient documentation

## 2013-01-13 MED ORDER — CYANOCOBALAMIN 1000 MCG/ML IJ SOLN
1000.0000 ug | Freq: Once | INTRAMUSCULAR | Status: AC
  Administered 2013-01-13: 1000 ug via INTRAMUSCULAR

## 2013-01-13 MED ORDER — CYANOCOBALAMIN 1000 MCG/ML IJ SOLN
INTRAMUSCULAR | Status: AC
  Filled 2013-01-13: qty 1

## 2013-01-13 NOTE — Patient Instructions (Signed)
24-Hour Urine Collection  HOME CARE   When you get up in the morning on the day you do this test, pee (urinate) in the toilet and flush. Make a note of the time. This will be your start time on the day of collection and the end time on the next morning.   From then on, save all your pee (urine) in the plastic jug that was given to you.   You should stop collecting your pee 24 hours after you started.   If the plastic jug that is given to you already has liquid in it, that is okay. Do not throw out the liquid or rinse out the jug. Some tests need the liquid to be added to your pee.   Keep your plastic jug cool (in an ice chest or the refrigerator) during the test.   When the 24 hours is over, bring your plastic jug to the clinic lab. Keep the jug cool (in an ice chest) while you are bringing it to the lab.  Document Released: 04/05/2008 Document Revised: 04/01/2011 Document Reviewed: 04/05/2008  Exit[employer] Patient Information 2014 [employer].

## 2013-01-13 NOTE — Progress Notes (Signed)
[name] presents today for injection per MD orders. B12 1000 mcg administered IM in left Upper Arm. Administration without incident. Patient tolerated well.  

## 2013-02-12 ENCOUNTER — Encounter (HOSPITAL_COMMUNITY): Payer: Medicare Other

## 2013-02-12 ENCOUNTER — Encounter (HOSPITAL_COMMUNITY): Payer: Medicare Other | Attending: Oncology

## 2013-02-12 VITALS — BP 115/68 | HR 88 | Resp 16

## 2013-02-12 DIAGNOSIS — D510 Vitamin B12 deficiency anemia due to intrinsic factor deficiency: Secondary | ICD-10-CM | POA: Insufficient documentation

## 2013-02-12 DIAGNOSIS — D3A092 Benign carcinoid tumor of the stomach: Secondary | ICD-10-CM | POA: Insufficient documentation

## 2013-02-12 LAB — COMPREHENSIVE METABOLIC PANEL
ALK PHOS: 119 U/L — AB (ref 39–117)
ALT: 11 U/L (ref 0–35)
AST: 22 U/L (ref 0–37)
Albumin: 4 g/dL (ref 3.5–5.2)
BILIRUBIN TOTAL: 0.6 mg/dL (ref 0.3–1.2)
BUN: 19 mg/dL (ref 6–23)
CHLORIDE: 103 meq/L (ref 96–112)
CO2: 25 meq/L (ref 19–32)
CREATININE: 1.07 mg/dL (ref 0.50–1.10)
Calcium: 9.6 mg/dL (ref 8.4–10.5)
GFR, EST AFRICAN AMERICAN: 52 mL/min — AB (ref >90)
GFR, EST NON AFRICAN AMERICAN: 45 mL/min — AB (ref >90)
GLUCOSE: 88 mg/dL (ref 70–99)
POTASSIUM: 3.7 meq/L (ref 3.7–5.3)
Sodium: 142 mEq/L (ref 137–147)
Total Protein: 8.5 g/dL — ABNORMAL HIGH (ref 6.0–8.3)

## 2013-02-12 LAB — CBC WITH DIFFERENTIAL/PLATELET
BASOS PCT: 0 % (ref 0–1)
Basophils Absolute: 0 10*3/uL (ref 0.0–0.1)
Eosinophils Absolute: 0.1 10*3/uL (ref 0.0–0.7)
Eosinophils Relative: 1 % (ref 0–5)
HCT: 40.4 % (ref 36.0–46.0)
HEMOGLOBIN: 12.8 g/dL (ref 12.0–15.0)
LYMPHS ABS: 1.5 10*3/uL (ref 0.7–4.0)
Lymphocytes Relative: 19 % (ref 12–46)
MCH: 31.4 pg (ref 26.0–34.0)
MCHC: 31.7 g/dL (ref 30.0–36.0)
MCV: 99.3 fL (ref 78.0–100.0)
MONOS PCT: 7 % (ref 3–12)
Monocytes Absolute: 0.6 10*3/uL (ref 0.1–1.0)
NEUTROS ABS: 5.9 10*3/uL (ref 1.7–7.7)
NEUTROS PCT: 72 % (ref 43–77)
Platelets: 175 10*3/uL (ref 150–400)
RBC: 4.07 MIL/uL (ref 3.87–5.11)
RDW: 12.4 % (ref 11.5–15.5)
WBC: 8.1 10*3/uL (ref 4.0–10.5)

## 2013-02-12 LAB — CREATININE CLEARANCE, URINE, 24 HOUR
CREAT CLEAR: 59 mL/min — AB (ref 75–115)
CREATININE 24H UR: 912 mg/d (ref 700–1800)
CREATININE, URINE: 91.24 mg/dL
Collection Interval-CRCL: 24 hours
Creatinine: 1.07 mg/dL (ref 0.50–1.10)
Urine Total Volume-CRCL: 1000 mL

## 2013-02-12 MED ORDER — CYANOCOBALAMIN 1000 MCG/ML IJ SOLN
INTRAMUSCULAR | Status: AC
  Filled 2013-02-12: qty 1

## 2013-02-12 MED ORDER — CYANOCOBALAMIN 1000 MCG/ML IJ SOLN
1000.0000 ug | Freq: Once | INTRAMUSCULAR | Status: AC
  Administered 2013-02-12: 1000 ug via INTRAMUSCULAR

## 2013-02-12 NOTE — Progress Notes (Signed)
[name] presented for labwork. Labs per MD order drawn via Peripheral Line 23 gauge needle inserted in left antecubital.  Good blood return present. Procedure without incident.  Needle removed intact. Patient tolerated procedure well.  [name] presents today for injection per MD orders. B12 1000 mcg administered IM in left Upper Arm. Administration without incident. Patient tolerated well.

## 2013-02-15 ENCOUNTER — Ambulatory Visit (HOSPITAL_COMMUNITY): Payer: Medicare Other | Admitting: Oncology

## 2013-02-15 LAB — 5 HIAA, QUANTITATIVE, URINE, 24 HOUR
5-HIAA, URINE: 6 mg/(24.h) (ref <=6.0)
Volume, Urine-5HIAA: 1000 mL/24 h

## 2013-02-17 LAB — SEROTONIN SERUM: SEROTONIN, SERUM: 91 ng/mL (ref 56–244)

## 2013-03-19 ENCOUNTER — Encounter (HOSPITAL_COMMUNITY): Payer: Medicare Other | Attending: Hematology and Oncology

## 2013-03-19 VITALS — BP 143/53 | HR 97 | Temp 97.4°F | Resp 18

## 2013-03-19 DIAGNOSIS — D510 Vitamin B12 deficiency anemia due to intrinsic factor deficiency: Secondary | ICD-10-CM

## 2013-03-19 MED ORDER — CYANOCOBALAMIN 1000 MCG/ML IJ SOLN
INTRAMUSCULAR | Status: AC
  Filled 2013-03-19: qty 1

## 2013-03-19 MED ORDER — CYANOCOBALAMIN 1000 MCG/ML IJ SOLN
1000.0000 ug | Freq: Once | INTRAMUSCULAR | Status: AC
  Administered 2013-03-19: 1000 ug via INTRAMUSCULAR

## 2013-03-19 NOTE — Progress Notes (Signed)
[name] presents today for injection per MD orders. B12 1000 mcg administered IM in left Upper Arm. Administration without incident. Patient tolerated well.  

## 2013-04-16 ENCOUNTER — Encounter (HOSPITAL_COMMUNITY): Payer: Medicare Other | Attending: Hematology and Oncology

## 2013-04-16 DIAGNOSIS — D510 Vitamin B12 deficiency anemia due to intrinsic factor deficiency: Secondary | ICD-10-CM

## 2013-04-16 MED ORDER — CYANOCOBALAMIN 1000 MCG/ML IJ SOLN
1000.0000 ug | Freq: Once | INTRAMUSCULAR | Status: AC
  Administered 2013-04-16: 1000 ug via INTRAMUSCULAR

## 2013-04-16 MED ORDER — CYANOCOBALAMIN 1000 MCG/ML IJ SOLN
INTRAMUSCULAR | Status: AC
  Filled 2013-04-16: qty 1

## 2013-04-16 NOTE — Progress Notes (Signed)
[name] presents today for injection per MD orders. B12 1000 mcg administered IM in left Upper Arm. Administration without incident. Patient tolerated well.  

## 2013-05-14 ENCOUNTER — Encounter (HOSPITAL_COMMUNITY): Payer: Medicare Other | Attending: Hematology and Oncology

## 2013-05-14 VITALS — BP 130/60 | HR 83

## 2013-05-14 DIAGNOSIS — D510 Vitamin B12 deficiency anemia due to intrinsic factor deficiency: Secondary | ICD-10-CM

## 2013-05-14 MED ORDER — CYANOCOBALAMIN 1000 MCG/ML IJ SOLN
INTRAMUSCULAR | Status: AC
  Filled 2013-05-14: qty 1

## 2013-05-14 MED ORDER — CYANOCOBALAMIN 1000 MCG/ML IJ SOLN
1000.0000 ug | Freq: Once | INTRAMUSCULAR | Status: AC
  Administered 2013-05-14: 1000 ug via INTRAMUSCULAR

## 2013-05-14 NOTE — Progress Notes (Signed)
[name] presents today for injection per MD orders. B12 1000mcg administered SQ in left Upper Arm. Administration without incident. Patient tolerated well.  

## 2013-06-11 ENCOUNTER — Encounter (HOSPITAL_COMMUNITY): Payer: Medicare Other | Attending: Oncology

## 2013-06-11 VITALS — BP 159/63 | HR 93 | Temp 97.7°F | Resp 16

## 2013-06-11 DIAGNOSIS — D510 Vitamin B12 deficiency anemia due to intrinsic factor deficiency: Secondary | ICD-10-CM | POA: Insufficient documentation

## 2013-06-11 MED ORDER — CYANOCOBALAMIN 1000 MCG/ML IJ SOLN
1000.0000 ug | Freq: Once | INTRAMUSCULAR | Status: AC
  Administered 2013-06-11: 1000 ug via INTRAMUSCULAR

## 2013-06-11 MED ORDER — CYANOCOBALAMIN 1000 MCG/ML IJ SOLN
INTRAMUSCULAR | Status: AC
  Filled 2013-06-11: qty 1

## 2013-06-11 NOTE — Progress Notes (Signed)
[name] presents today for injection per MD orders. B12 1000 mcg administered IM in left Upper Arm by Tammy Robertson,RN. Administration without incident. Patient tolerated well.

## 2013-07-16 ENCOUNTER — Encounter (HOSPITAL_COMMUNITY): Payer: Medicare Other | Attending: Oncology

## 2013-07-16 VITALS — BP 119/57 | HR 90 | Resp 18

## 2013-07-16 DIAGNOSIS — D510 Vitamin B12 deficiency anemia due to intrinsic factor deficiency: Secondary | ICD-10-CM

## 2013-07-16 MED ORDER — CYANOCOBALAMIN 1000 MCG/ML IJ SOLN
INTRAMUSCULAR | Status: AC
  Filled 2013-07-16: qty 1

## 2013-07-16 MED ORDER — CYANOCOBALAMIN 1000 MCG/ML IJ SOLN
1000.0000 ug | Freq: Once | INTRAMUSCULAR | Status: AC
  Administered 2013-07-16: 1000 ug via INTRAMUSCULAR

## 2013-07-16 NOTE — Progress Notes (Signed)
[name] presents today for injection per the [facility]'s orders.  B12 1000 mcg IM given left deltoid without incident; Patient tolerated procedure well and without incident.  No questions or complaints noted at this time.

## 2013-08-07 NOTE — Progress Notes (Signed)
Jessica Font, MD [address] [place] [address]  Pernicious anemia - Plan: SCHEDULING COMMUNICATION INJECTION, DISCONTINUED: cyanocobalamin ((VITAMIN B-12)) injection 1,000 mcg  Neuroendocrine carcinoid tumor of stomach - Plan: CBC with Differential, Comprehensive metabolic panel, Serotonin serum, 5 HIAA, quantitative, urine, 24 hour, Creatinine clearance, urine, 24 hour, CBC with Differential, Comprehensive metabolic panel, Serotonin serum  CURRENT THERAPY: Monthly B12 injections. Observation via labs of neuroendocrine carcinoid tumor of the stomach    INTERVAL HISTORY: [name] 80 y.o. 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  I personally reviewed and went over laboratory results with the patient.  The results are noted within this dictation.  I personally reviewed and went over radiographic studies with the patient.  The results are noted within this dictation.  Screening mammogram in Sept 2014 was BIRDS 1 and she will be due for her next screening mammogram in 2 months or so.  She denies any complaints. She denies any flushing, diarrhea, and heart palpitations.  Oncologically, she denies any complaints and ROS questioning is negative.     Past Medical History  Diagnosis Date  . Hiatal hernia   . HTN (hypertension)   . Hyperlipidemia   . Diverticulosis   . Carcinoid tumor of stomach 2008  . Adult BMI 19-24 kg/sq m 2008 125 lbs  . Anemia     Pernicious 2008-HB 12 MCV 119.5; JAN 2012 HB 13.2 MCV 97.6  . GERD (gastroesophageal reflux disease)   . Pernicious anemia   . Wrist fracture, left 2008  . Pernicious anemia 06/14/2010  . 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     is allergic to codeine.  We administered cyanocobalamin.  Past Surgical History  Procedure Laterality Date  . Colonoscopy  2003 LS    DC/[place] Diverticulosis  . Eus  2008 DJ    FNA NO CARCINOID  . Eus  JAN 2009    NL EXAM, NO Bx  . Upper gastrointestinal endoscopy  APR 2008 SL[facility]    CARCIONOID  . Upper gastrointestinal endoscopy  FEB 2011 [facility]    CARCINOID/mild anemia/large hiatal hernia  . Eus  [facility] 2011 WO    CARCINOID    Denies any headaches, dizziness, double vision, fevers, chills, night sweats, nausea, vomiting, diarrhea, constipation, chest pain, heart palpitations, shortness of breath, blood in stool, black tarry stool, urinary pain, urinary burning, urinary frequency, hematuria.   PHYSICAL EXAMINATION  ECOG PERFORMANCE STATUS: 1 - Symptomatic but completely ambulatory  Filed Vitals:   08/13/13 1000  BP: 144/54  Pulse: 91  Temp: 97.8 F (36.6 C)  Resp: 16    GENERAL:alert, no distress, well nourished, well developed, comfortable, cooperative and smiling SKIN: skin color, texture, turgor are normal, no rashes or significant lesions HEAD: Normocephalic, No masses, lesions, tenderness or abnormalities EYES: normal, PERRLA, EOMI, Conjunctiva are pink and non-injected EARS: External ears normal OROPHARYNX:mucous membranes are moist  NECK: supple, no adenopathy, thyroid normal size, non-tender, without nodularity, no stridor, non-tender, trachea midline LYMPH:  no palpable lymphadenopathy BREAST:not examined LUNGS: clear to auscultation and percussion HEART: regular rate & rhythm, no murmurs and no gallops ABDOMEN:abdomen soft and normal bowel sounds BACK: Back symmetric, no curvature.  EXTREMITIES:less then 2 second capillary refill, no joint deformities, effusion, or inflammation, no skin discoloration, no clubbing, no cyanosis  NEURO: alert & oriented x 3 with fluent speech, no focal motor/sensory deficits, gait normal    LABORATORY DATA: CBC      Component Value Date/Time   WBC 5.6 08/13/2013 1003   RBC 4.10 08/13/2013 1003   HGB 13.1 08/13/2013 1003   HCT 40.8 08/13/2013 1003   PLT 192 08/13/2013 1003   MCV 99.5 08/13/2013 1003   MCH 32.0 08/13/2013 1003   MCHC 32.1 08/13/2013 1003   RDW 12.4 08/13/2013 1003   LYMPHSABS 1.7 08/13/2013 1003   MONOABS 0.4 08/13/2013 1003   EOSABS 0.1 08/13/2013 1003   BASOSABS 0.0 08/13/2013 1003      Chemistry      Component Value Date/Time   NA 142 02/12/2013 1030   K 3.7 02/12/2013 1030   CL 103 02/12/2013 1030   CO2 25 02/12/2013 1030   BUN 19 02/12/2013 1030   CREATININE 1.07 02/12/2013 1030   CREATININE 1.07 02/12/2013 1029      Component Value Date/Time   CALCIUM 9.6 02/12/2013 1030   ALKPHOS 119* 02/12/2013 1030   AST 22 02/12/2013 1030   ALT 11 02/12/2013 1030   BILITOT 0.6 02/12/2013 1030     Lab Results  Component Value Date   IRON 63 08/07/2012   TIBC 327 08/07/2012   FERRITIN 25 08/07/2012   Results for HYDEIA, MCATEE (MRN [id number]) as of 08/07/2013 18:29  Ref. Range 02/12/2013 10:30  Serotonin, Serum Latest Range: 56-244 ng/mL 91   Results for WANDY, BOSSLER (MRN [id number]) as of 08/07/2013 18:29  Ref. Range 02/12/2013 10:29  5-HIAA, 24 Hr Urine Latest Range: <=6.0 mg/24 h 6.0     ASSESSMENT:  1. Pernicious anemia, receiving monthly B12 injections with normalization of CBC.  2. 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 Declining weight over the past 3 years. Asymptomatic.  Patient Active Problem List   Diagnosis Date Noted  . 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 Pernicious anemia 06/14/2010  . 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 DM 04/03/2009  . HYPERLIPIDEMIA 04/03/2009  . HYPERTENSION 04/03/2009  . GERD 04/03/2009  . WEAKNESS 04/03/2009     PLAN:  1. I personally reviewed and went over laboratory results with the patient.  2. Labs in 6 months: CBC diff, CMET, serum  serotonin, 24 hr urine collection for CrCl and 5-HIAA  3. Labs in 12 months: CBC diff, CMET, serum serotonin  4. Continue monthly B12 injections  5. Return in 12 months for follow-up   THERAPY PLAN:  She is doing very well from our standpoint and we will continue to administer monthly B12 injections and monitor lab work. We will see her back in 1 year.   All questions were answered. The patient knows to call the clinic with any problems, questions or concerns. We can certainly see the patient much sooner if necessary.  Patient and plan discussed with Dr. Nelida Meuse and he is in agreement with the aforementioned.    Hakim Minniefield 08/13/2013

## 2013-08-13 ENCOUNTER — Encounter (HOSPITAL_COMMUNITY): Payer: Self-pay | Admitting: Oncology

## 2013-08-13 ENCOUNTER — Encounter (HOSPITAL_BASED_OUTPATIENT_CLINIC_OR_DEPARTMENT_OTHER): Payer: Medicare Other

## 2013-08-13 ENCOUNTER — Encounter (HOSPITAL_COMMUNITY): Payer: Medicare Other

## 2013-08-13 ENCOUNTER — Encounter (HOSPITAL_COMMUNITY): Payer: Medicare Other | Attending: Oncology | Admitting: Oncology

## 2013-08-13 VITALS — BP 144/54 | HR 91 | Temp 97.8°F | Resp 16 | Wt 116.8 lb

## 2013-08-13 DIAGNOSIS — D3A092 Benign carcinoid tumor of the stomach: Secondary | ICD-10-CM

## 2013-08-13 DIAGNOSIS — D510 Vitamin B12 deficiency anemia due to intrinsic factor deficiency: Secondary | ICD-10-CM

## 2013-08-13 LAB — CBC WITH DIFFERENTIAL/PLATELET
Basophils Absolute: 0 10*3/uL (ref 0.0–0.1)
Basophils Relative: 0 % (ref 0–1)
Eosinophils Absolute: 0.1 10*3/uL (ref 0.0–0.7)
Eosinophils Relative: 1 % (ref 0–5)
HCT: 40.8 % (ref 36.0–46.0)
Hemoglobin: 13.1 g/dL (ref 12.0–15.0)
LYMPHS ABS: 1.7 10*3/uL (ref 0.7–4.0)
Lymphocytes Relative: 31 % (ref 12–46)
MCH: 32 pg (ref 26.0–34.0)
MCHC: 32.1 g/dL (ref 30.0–36.0)
MCV: 99.5 fL (ref 78.0–100.0)
MONOS PCT: 7 % (ref 3–12)
Monocytes Absolute: 0.4 10*3/uL (ref 0.1–1.0)
NEUTROS PCT: 61 % (ref 43–77)
Neutro Abs: 3.5 10*3/uL (ref 1.7–7.7)
Platelets: 192 10*3/uL (ref 150–400)
RBC: 4.1 MIL/uL (ref 3.87–5.11)
RDW: 12.4 % (ref 11.5–15.5)
WBC: 5.6 10*3/uL (ref 4.0–10.5)

## 2013-08-13 LAB — COMPREHENSIVE METABOLIC PANEL
ALK PHOS: 109 U/L (ref 39–117)
ALT: 7 U/L (ref 0–35)
AST: 24 U/L (ref 0–37)
Albumin: 4.1 g/dL (ref 3.5–5.2)
Anion gap: 13 (ref 5–15)
BILIRUBIN TOTAL: 0.5 mg/dL (ref 0.3–1.2)
BUN: 21 mg/dL (ref 6–23)
CO2: 24 mEq/L (ref 19–32)
Calcium: 9.5 mg/dL (ref 8.4–10.5)
Chloride: 103 mEq/L (ref 96–112)
Creatinine, Ser: 1.1 mg/dL (ref 0.50–1.10)
GFR, EST AFRICAN AMERICAN: 50 mL/min — AB (ref >90)
GFR, EST NON AFRICAN AMERICAN: 43 mL/min — AB (ref >90)
GLUCOSE: 121 mg/dL — AB (ref 70–99)
POTASSIUM: 4.1 meq/L (ref 3.7–5.3)
Sodium: 140 mEq/L (ref 137–147)
Total Protein: 8.2 g/dL (ref 6.0–8.3)

## 2013-08-13 MED ORDER — CYANOCOBALAMIN 1000 MCG/ML IJ SOLN: 1000.0000 ug | Freq: Once | INTRAMUSCULAR | Status: DC

## 2013-08-13 MED ORDER — CYANOCOBALAMIN 1000 MCG/ML IJ SOLN
1000.0000 ug | Freq: Once | INTRAMUSCULAR | Status: AC
  Administered 2013-08-13: 1000 ug via INTRAMUSCULAR

## 2013-08-13 MED ORDER — CYANOCOBALAMIN 1000 MCG/ML IJ SOLN
INTRAMUSCULAR | Status: AC
Start: 2013-08-13 — End: 2013-08-13
  Filled 2013-08-13: qty 1

## 2013-08-13 MED ORDER — CYANOCOBALAMIN 1000 MCG/ML IJ SOLN
INTRAMUSCULAR | Status: AC
  Filled 2013-08-13: qty 1

## 2013-08-13 NOTE — Progress Notes (Signed)
[name] presents today for injection per MD orders. B12 1000 mcg administered IM in left Upper Arm. Administration without incident. Patient tolerated well.  

## 2013-08-13 NOTE — Patient Instructions (Signed)
[place] Discharge Instructions  RECOMMENDATIONS MADE BY THE CONSULTANT AND ANY TEST RESULTS WILL BE SENT TO YOUR REFERRING PHYSICIAN.  EXAM FINDINGS BY THE PHYSICIAN TODAY AND SIGNS OR SYMPTOMS TO REPORT TO CLINIC OR PRIMARY PHYSICIAN: Exam and findings as discussed by Meriel Flavors.  MEDICATIONS PRESCRIBED:  Continue all medications as prescribed. Continue monthly B12 injections.  INSTRUCTIONS/FOLLOW-UP: Lab work in 6 months and 12 months. MD appointment again in 12 months.  Thank you for choosing [facility] to provide your oncology and hematology care.  To afford each patient quality time with our providers, please arrive at least 15 minutes before your scheduled appointment time.  With your help, our goal is to use those 15 minutes to complete the necessary work-up to ensure our physicians have the information they need to help with your evaluation and healthcare recommendations.    Effective January 1st, 2014, we ask that you re-schedule your appointment with our physicians should you arrive 10 or more minutes late for your appointment.  We strive to give you quality time with our providers, and arriving late affects you and other patients whose appointments are after yours.    Again, thank you for choosing [facility].  Our hope is that these requests will decrease the amount of time that you wait before being seen by our physicians.       _____________________________________________________________  Should you have questions after your visit to [facility], please contact our office at [phone] between the hours of 8:30 a.m. and 4:30 p.m.  Voicemails left after 4:30 p.m. will not be returned until the following business day.  For prescription refill requests, have your pharmacy contact our office with your prescription refill request.    _______________________________________________________________  We hope  that we have given you very good care.  You may receive a patient satisfaction survey in the mail, please complete it and return it as soon as possible.  We value your feedback!  _______________________________________________________________  Have you asked about our STAR program?  STAR stands for Survivorship Training and Rehabilitation, and this is a nationally recognized cancer care program that focuses on survivorship and rehabilitation.  Cancer and cancer treatments may cause problems, such as, pain, making you feel tired and keeping you from doing the things that you need or want to do. Cancer rehabilitation can help. Our goal is to reduce these troubling effects and help you have the best quality of life possible.  You may receive a survey from a nurse that asks questions about your current state of health.  Based on the survey results, all eligible patients will be referred to the [facility] program for an evaluation so we can better serve you!  A frequently asked questions sheet is available upon request.

## 2013-08-13 NOTE — Progress Notes (Signed)
Labs drawn for cbcd,cmp,sero

## 2013-08-18 LAB — SEROTONIN SERUM: Serotonin, Serum: 79 ng/mL (ref 56–244)

## 2013-09-08 ENCOUNTER — Encounter (HOSPITAL_COMMUNITY): Payer: Medicare Other | Attending: Oncology

## 2013-09-08 DIAGNOSIS — D510 Vitamin B12 deficiency anemia due to intrinsic factor deficiency: Secondary | ICD-10-CM | POA: Insufficient documentation

## 2013-09-08 MED ORDER — CYANOCOBALAMIN 1000 MCG/ML IJ SOLN
INTRAMUSCULAR | Status: AC
  Filled 2013-09-08: qty 1

## 2013-09-08 MED ORDER — CYANOCOBALAMIN 1000 MCG/ML IJ SOLN
1000.0000 ug | Freq: Once | INTRAMUSCULAR | Status: AC
  Administered 2013-09-08: 1000 ug via INTRAMUSCULAR

## 2013-09-08 NOTE — Progress Notes (Signed)
[name] presents today for injection per MD orders. B12 1000mcg administered SQ in left Upper Arm. Administration without incident. Patient tolerated well.  

## 2013-10-07 ENCOUNTER — Encounter (HOSPITAL_COMMUNITY): Payer: Medicare Other | Attending: Oncology

## 2013-10-07 ENCOUNTER — Encounter (HOSPITAL_COMMUNITY): Payer: Self-pay

## 2013-10-07 VITALS — BP 149/57 | HR 98 | Temp 97.9°F | Resp 18

## 2013-10-07 DIAGNOSIS — D510 Vitamin B12 deficiency anemia due to intrinsic factor deficiency: Secondary | ICD-10-CM | POA: Insufficient documentation

## 2013-10-07 MED ORDER — CYANOCOBALAMIN 1000 MCG/ML IJ SOLN
INTRAMUSCULAR | Status: AC
  Filled 2013-10-07: qty 1

## 2013-10-07 MED ORDER — CYANOCOBALAMIN 1000 MCG/ML IJ SOLN
1000.0000 ug | Freq: Once | INTRAMUSCULAR | Status: AC
  Administered 2013-10-07: 1000 ug via INTRAMUSCULAR

## 2013-10-07 NOTE — Patient Instructions (Signed)
..  [place] Discharge Instructions      MEDICATIONS PRESCRIBED:  Continue B12 monthly  INSTRUCTIONS/FOLLOW-UP: monthly  Thank you for choosing [facility] to provide your oncology and hematology care.  To afford each patient quality time with our providers, please arrive at least 15 minutes before your scheduled appointment time.  With your help, our goal is to use those 15 minutes to complete the necessary work-up to ensure our physicians have the information they need to help with your evaluation and healthcare recommendations.    Effective January 1st, 2014, we ask that you re-schedule your appointment with our physicians should you arrive 10 or more minutes late for your appointment.  We strive to give you quality time with our providers, and arriving late affects you and other patients whose appointments are after yours.    Again, thank you for choosing [facility].  Our hope is that these requests will decrease the amount of time that you wait before being seen by our physicians.       _____________________________________________________________  Should you have questions after your visit to [facility], please contact our office at [phone] between the hours of 8:30 a.m. and 4:30 p.m.  Voicemails left after 4:30 p.m. will not be returned until the following business day.  For prescription refill requests, have your pharmacy contact our office with your prescription refill request.    _______________________________________________________________  We hope that we have given you very good care.  You may receive a patient satisfaction survey in the mail, please complete it and return it as soon as possible.  We value your feedback!  _______________________________________________________________  Have you asked about our STAR program?  STAR stands for Survivorship Training and Rehabilitation, and this is a  nationally recognized cancer care program that focuses on survivorship and rehabilitation.  Cancer and cancer treatments may cause problems, such as, pain, making you feel tired and keeping you from doing the things that you need or want to do. Cancer rehabilitation can help. Our goal is to reduce these troubling effects and help you have the best quality of life possible.  You may receive a survey from a nurse that asks questions about your current state of health.  Based on the survey results, all eligible patients will be referred to the [facility] program for an evaluation so we can better serve you!  A frequently asked questions sheet is available upon request.

## 2013-10-07 NOTE — Progress Notes (Signed)
[name] presents today for injection per MD orders. B12 1000mcg administered SQ in left Upper Arm. Administration without incident. Patient tolerated well.  

## 2013-11-04 ENCOUNTER — Encounter (HOSPITAL_COMMUNITY): Payer: Medicare Other | Attending: Oncology

## 2013-11-04 VITALS — BP 149/59 | HR 79 | Temp 97.7°F | Resp 16

## 2013-11-04 DIAGNOSIS — D510 Vitamin B12 deficiency anemia due to intrinsic factor deficiency: Secondary | ICD-10-CM | POA: Insufficient documentation

## 2013-11-04 MED ORDER — CYANOCOBALAMIN 1000 MCG/ML IJ SOLN
1000.0000 ug | Freq: Once | INTRAMUSCULAR | Status: AC
  Administered 2013-11-04: 1000 ug via INTRAMUSCULAR

## 2013-11-04 MED ORDER — CYANOCOBALAMIN 1000 MCG/ML IJ SOLN
INTRAMUSCULAR | Status: AC
Start: 2013-11-04 — End: 2013-11-04
  Filled 2013-11-04: qty 1

## 2013-11-04 NOTE — Progress Notes (Signed)
[name] presents today for injection per MD orders. B12 1000 mcg administered IM in left Upper Arm. Administration without incident. Patient tolerated well.  

## 2013-11-04 NOTE — Patient Instructions (Signed)
INSTRUCTIONS/FOLLOW-UP: B12 injection today. Continue monthly B12 injection. Return as scheduled.  Thank you for choosing [facility] to provide your oncology and hematology care.  To afford each patient quality time with our providers, please arrive at least 15 minutes before your scheduled appointment time.  With your help, our goal is to use those 15 minutes to complete the necessary work-up to ensure our physicians have the information they need to help with your evaluation and healthcare recommendations.    Effective January 1st, 2014, we ask that you re-schedule your appointment with our physicians should you arrive 10 or more minutes late for your appointment.  We strive to give you quality time with our providers, and arriving late affects you and other patients whose appointments are after yours.    Again, thank you for choosing [facility].  Our hope is that these requests will decrease the amount of time that you wait before being seen by our physicians.       _____________________________________________________________  Should you have questions after your visit to [facility], please contact our office at [phone] between the hours of 8:30 a.m. and 4:30 p.m.  Voicemails left after 4:30 p.m. will not be returned until the following business day.  For prescription refill requests, have your pharmacy contact our office with your prescription refill request.    _______________________________________________________________  We hope that we have given you very good care.  You may receive a patient satisfaction survey in the mail, please complete it and return it as soon as possible.  We value your feedback!  _______________________________________________________________  Have you asked about our STAR program?  STAR stands for Survivorship Training and Rehabilitation, and this is a nationally recognized cancer care program that focuses on  survivorship and rehabilitation.  Cancer and cancer treatments may cause problems, such as, pain, making you feel tired and keeping you from doing the things that you need or want to do. Cancer rehabilitation can help. Our goal is to reduce these troubling effects and help you have the best quality of life possible.  You may receive a survey from a nurse that asks questions about your current state of health.  Based on the survey results, all eligible patients will be referred to the [facility] program for an evaluation so we can better serve you!  A frequently asked questions sheet is available upon request.

## 2013-11-26 ENCOUNTER — Other Ambulatory Visit (HOSPITAL_COMMUNITY): Payer: Self-pay | Admitting: Family Medicine

## 2013-11-26 DIAGNOSIS — Z1231 Encounter for screening mammogram for malignant neoplasm of breast: Secondary | ICD-10-CM

## 2013-12-02 ENCOUNTER — Encounter (HOSPITAL_COMMUNITY): Payer: Medicare Other | Attending: Oncology

## 2013-12-02 ENCOUNTER — Ambulatory Visit (HOSPITAL_COMMUNITY)
Admission: RE | Admit: 2013-12-02 | Discharge: 2013-12-02 | Disposition: A | Discharge status: Home / Self Care | Payer: Medicare Other | Source: Ambulatory Visit | Attending: Family Medicine | Admitting: Family Medicine

## 2013-12-02 ENCOUNTER — Encounter (HOSPITAL_COMMUNITY): Payer: Self-pay

## 2013-12-02 DIAGNOSIS — Z1231 Encounter for screening mammogram for malignant neoplasm of breast: Secondary | ICD-10-CM | POA: Diagnosis not present

## 2013-12-02 DIAGNOSIS — D510 Vitamin B12 deficiency anemia due to intrinsic factor deficiency: Secondary | ICD-10-CM | POA: Insufficient documentation

## 2013-12-02 MED ORDER — CYANOCOBALAMIN 1000 MCG/ML IJ SOLN
1000.0000 ug | Freq: Once | INTRAMUSCULAR | Status: AC
  Administered 2013-12-02: 1000 ug via INTRAMUSCULAR

## 2013-12-02 MED ORDER — CYANOCOBALAMIN 1000 MCG/ML IJ SOLN
INTRAMUSCULAR | Status: AC
  Filled 2013-12-02: qty 1

## 2013-12-02 NOTE — Patient Instructions (Signed)
[place] Discharge Instructions  RECOMMENDATIONS MADE BY THE CONSULTANT AND ANY TEST RESULTS WILL BE SENT TO YOUR REFERRING PHYSICIAN. You received a vitamin b12 injection today.  follow up monthly for your b12 injections.  Please call the clinic if you have any questions or concerns   Thank you for choosing [facility] to provide your oncology and hematology care.  To afford each patient quality time with our providers, please arrive at least 15 minutes before your scheduled appointment time.  With your help, our goal is to use those 15 minutes to complete the necessary work-up to ensure our physicians have the information they need to help with your evaluation and healthcare recommendations.    Effective January 1st, 2014, we ask that you re-schedule your appointment with our physicians should you arrive 10 or more minutes late for your appointment.  We strive to give you quality time with our providers, and arriving late affects you and other patients whose appointments are after yours.    Again, thank you for choosing [facility].  Our hope is that these requests will decrease the amount of time that you wait before being seen by our physicians.       _____________________________________________________________  Should you have questions after your visit to [facility], please contact our office at [phone] between the hours of 8:30 a.m. and 5:00 p.m.  Voicemails left after 4:30 p.m. will not be returned until the following business day.  For prescription refill requests, have your pharmacy contact our office with your prescription refill request.

## 2013-12-02 NOTE — Progress Notes (Signed)
Received a vitamin b12 injection per MD orders tolerated well

## 2013-12-29 ENCOUNTER — Encounter (HOSPITAL_COMMUNITY): Payer: Medicare Other | Attending: Oncology

## 2013-12-29 DIAGNOSIS — D510 Vitamin B12 deficiency anemia due to intrinsic factor deficiency: Secondary | ICD-10-CM | POA: Insufficient documentation

## 2013-12-29 MED ORDER — CYANOCOBALAMIN 1000 MCG/ML IJ SOLN
INTRAMUSCULAR | Status: AC
  Filled 2013-12-29: qty 1

## 2013-12-29 MED ORDER — CYANOCOBALAMIN 1000 MCG/ML IJ SOLN
1000.0000 ug | Freq: Once | INTRAMUSCULAR | Status: AC
Start: 2013-12-29 — End: 2013-12-29
  Administered 2013-12-29: 1000 ug via INTRAMUSCULAR

## 2013-12-29 NOTE — Progress Notes (Signed)
[name] presents today for injection per MD orders. B12 1000mcg administered SQ in left Upper Arm. Administration without incident. Patient tolerated well.  

## 2013-12-29 NOTE — Patient Instructions (Signed)
[place] Discharge Instructions  RECOMMENDATIONS MADE BY THE CONSULTANT AND ANY TEST RESULTS WILL BE SENT TO YOUR REFERRING PHYSICIAN.  MEDICATIONS PRESCRIBED:  B12 injection today. Continue monthly B12 injections.  INSTRUCTIONS/FOLLOW-UP: Return as scheduled 01/27/14.  Thank you for choosing [place] to provide your oncology and hematology care.  To afford each patient quality time with our providers, please arrive at least 15 minutes before your scheduled appointment time.  With your help, our goal is to use those 15 minutes to complete the necessary work-up to ensure our physicians have the information they need to help with your evaluation and healthcare recommendations.    Effective January 1st, 2014, we ask that you re-schedule your appointment with our physicians should you arrive 10 or more minutes late for your appointment.  We strive to give you quality time with our providers, and arriving late affects you and other patients whose appointments are after yours.    Again, thank you for choosing [facility].  Our hope is that these requests will decrease the amount of time that you wait before being seen by our physicians.       _____________________________________________________________  Should you have questions after your visit to [facility], please contact our office at [phone] between the hours of 8:30 a.m. and 4:30 p.m.  Voicemails left after 4:30 p.m. will not be returned until the following business day.  For prescription refill requests, have your pharmacy contact our office with your prescription refill request.    _______________________________________________________________  We hope that we have given you very good care.  You may receive a patient satisfaction survey in the mail, please complete it and return it as soon as possible.  We value your  feedback!  _______________________________________________________________  Have you asked about our STAR program?  STAR stands for Survivorship Training and Rehabilitation, and this is a nationally recognized cancer care program that focuses on survivorship and rehabilitation.  Cancer and cancer treatments may cause problems, such as, pain, making you feel tired and keeping you from doing the things that you need or want to do. Cancer rehabilitation can help. Our goal is to reduce these troubling effects and help you have the best quality of life possible.  You may receive a survey from a nurse that asks questions about your current state of health.  Based on the survey results, all eligible patients will be referred to the [facility] program for an evaluation so we can better serve you!  A frequently asked questions sheet is available upon request.

## 2013-12-30 ENCOUNTER — Encounter (HOSPITAL_COMMUNITY): Payer: Medicare Other

## 2014-01-27 ENCOUNTER — Encounter (HOSPITAL_COMMUNITY): Payer: Medicare Other | Attending: Oncology

## 2014-01-27 ENCOUNTER — Encounter (HOSPITAL_BASED_OUTPATIENT_CLINIC_OR_DEPARTMENT_OTHER): Payer: Medicare Other

## 2014-01-27 ENCOUNTER — Encounter (HOSPITAL_COMMUNITY): Payer: Medicare Other

## 2014-01-27 ENCOUNTER — Other Ambulatory Visit (HOSPITAL_COMMUNITY): Payer: Self-pay | Admitting: Oncology

## 2014-01-27 DIAGNOSIS — D510 Vitamin B12 deficiency anemia due to intrinsic factor deficiency: Secondary | ICD-10-CM | POA: Diagnosis not present

## 2014-01-27 DIAGNOSIS — D3A092 Benign carcinoid tumor of the stomach: Secondary | ICD-10-CM

## 2014-01-27 LAB — CBC WITH DIFFERENTIAL/PLATELET
BASOS ABS: 0 10*3/uL (ref 0.0–0.1)
BASOS PCT: 0 % (ref 0–1)
Eosinophils Absolute: 0.1 10*3/uL (ref 0.0–0.7)
Eosinophils Relative: 1 % (ref 0–5)
HEMATOCRIT: 43.6 % (ref 36.0–46.0)
Hemoglobin: 13.3 g/dL (ref 12.0–15.0)
Lymphocytes Relative: 22 % (ref 12–46)
Lymphs Abs: 1.4 10*3/uL (ref 0.7–4.0)
MCH: 30.9 pg (ref 26.0–34.0)
MCHC: 30.5 g/dL (ref 30.0–36.0)
MCV: 101.4 fL — AB (ref 78.0–100.0)
MONO ABS: 0.5 10*3/uL (ref 0.1–1.0)
MONOS PCT: 7 % (ref 3–12)
Neutro Abs: 4.4 10*3/uL (ref 1.7–7.7)
Neutrophils Relative %: 69 % (ref 43–77)
Platelets: 159 10*3/uL (ref 150–400)
RBC: 4.3 MIL/uL (ref 3.87–5.11)
RDW: 12.5 % (ref 11.5–15.5)
WBC: 6.4 10*3/uL (ref 4.0–10.5)

## 2014-01-27 LAB — COMPREHENSIVE METABOLIC PANEL
ALT: 13 U/L (ref 0–35)
AST: 26 U/L (ref 0–37)
Albumin: 4.6 g/dL (ref 3.5–5.2)
Alkaline Phosphatase: 113 U/L (ref 39–117)
Anion gap: 9 (ref 5–15)
BUN: 20 mg/dL (ref 6–23)
CALCIUM: 9.6 mg/dL (ref 8.4–10.5)
CO2: 26 mmol/L (ref 19–32)
Chloride: 105 mEq/L (ref 96–112)
Creatinine, Ser: 1.1 mg/dL (ref 0.50–1.10)
GFR, EST AFRICAN AMERICAN: 50 mL/min — AB (ref >90)
GFR, EST NON AFRICAN AMERICAN: 43 mL/min — AB (ref >90)
GLUCOSE: 76 mg/dL (ref 70–99)
Potassium: 4 mmol/L (ref 3.5–5.1)
Sodium: 140 mmol/L (ref 135–145)
TOTAL PROTEIN: 8.6 g/dL — AB (ref 6.0–8.3)
Total Bilirubin: 0.6 mg/dL (ref 0.3–1.2)

## 2014-01-27 MED ORDER — CYANOCOBALAMIN 1000 MCG/ML IJ SOLN
1000.0000 ug | Freq: Once | INTRAMUSCULAR | Status: AC
  Administered 2014-01-27: 1000 ug via INTRAMUSCULAR

## 2014-01-27 MED ORDER — CYANOCOBALAMIN 1000 MCG/ML IJ SOLN
INTRAMUSCULAR | Status: AC
  Filled 2014-01-27: qty 1

## 2014-01-27 NOTE — Progress Notes (Signed)
LABS FOR CMP,CBCD 

## 2014-01-27 NOTE — Progress Notes (Signed)
[name] presents today for injection per MD orders. B12 1000 mcg administered IM in left Upper Arm. Administration without incident. Patient tolerated well.

## 2014-01-27 NOTE — Patient Instructions (Signed)
[place] at [facility] Discharge Instructions  RECOMMENDATIONS MADE BY THE CONSULTANT AND ANY TEST RESULTS WILL BE SENT TO YOUR REFERRING PHYSICIAN.  Vitamin B12 injection today. Continue Vitamin B12 injections monthly. Return as scheduled.  Thank you for choosing [place] at [facility] to provide your oncology and hematology care.  To afford each patient quality time with our provider, please arrive at least 15 minutes before your scheduled appointment time.    You need to re-schedule your appointment should you arrive 10 or more minutes late.  We strive to give you quality time with our providers, and arriving late affects you and other patients whose appointments are after yours.  Also, if you no show three or more times for appointments you may be dismissed from the clinic at the providers discretion.     Again, thank you for choosing [facility].  Our hope is that these requests will decrease the amount of time that you wait before being seen by our physicians.       _____________________________________________________________  Should you have questions after your visit to [facility], please contact our office at [phone] between the hours of 8:30 a.m. and 4:30 p.m.  Voicemails left after 4:30 p.m. will not be returned until the following business day.  For prescription refill requests, have your pharmacy contact our office.

## 2014-02-24 ENCOUNTER — Encounter (HOSPITAL_COMMUNITY): Payer: Self-pay

## 2014-02-24 ENCOUNTER — Encounter (HOSPITAL_COMMUNITY): Payer: Medicare Other | Attending: Hematology & Oncology

## 2014-02-24 DIAGNOSIS — D510 Vitamin B12 deficiency anemia due to intrinsic factor deficiency: Secondary | ICD-10-CM

## 2014-02-24 MED ORDER — CYANOCOBALAMIN 1000 MCG/ML IJ SOLN
1000.0000 ug | Freq: Once | INTRAMUSCULAR | Status: AC
Start: 2014-02-24 — End: 2014-02-24
  Administered 2014-02-24: 1000 ug via INTRAMUSCULAR

## 2014-02-24 MED ORDER — CYANOCOBALAMIN 1000 MCG/ML IJ SOLN
INTRAMUSCULAR | Status: AC
  Filled 2014-02-24: qty 1

## 2014-02-24 NOTE — Progress Notes (Signed)
[name] reason for visit today is for an injection as scheduled per MD orders. [name] also received vitamin b12 per MD orders; see [facility] for administration details.  [name] tolerated all procedures well and without incident; questions were answered and patient was discharged.

## 2014-02-24 NOTE — Patient Instructions (Signed)
[place] at [facility]  Discharge Instructions:  You received a vitamin b12 injection today. Return as scheduled.  Call the clinic if you have any questions or conerns _______________________________________________________________  Thank you for choosing [place] at [facility] to provide your oncology and hematology care.  To afford each patient quality time with our providers, please arrive at least 15 minutes before your scheduled appointment.  You need to re-schedule your appointment if you arrive 10 or more minutes late.  We strive to give you quality time with our providers, and arriving late affects you and other patients whose appointments are after yours.  Also, if you no show three or more times for appointments you may be dismissed from the clinic.  Again, thank you for choosing [place] at [place] hope is that these requests will allow you access to exceptional care and in a timely manner. _______________________________________________________________  If you have questions after your visit, please contact our office at [phone] between the hours of 8:30 a.m. and 5:00 p.m. Voicemails left after 4:30 p.m. will not be returned until the following business day. _______________________________________________________________  For prescription refill requests, have your pharmacy contact our office. _______________________________________________________________  Recommendations made by the consultant and any test results will be sent to your referring physician. _______________________________________________________________

## 2014-03-24 ENCOUNTER — Encounter (HOSPITAL_COMMUNITY): Payer: Medicare Other | Attending: Hematology & Oncology

## 2014-03-24 ENCOUNTER — Encounter (HOSPITAL_COMMUNITY): Payer: Self-pay

## 2014-03-24 ENCOUNTER — Encounter (HOSPITAL_BASED_OUTPATIENT_CLINIC_OR_DEPARTMENT_OTHER): Payer: Medicare Other

## 2014-03-24 VITALS — BP 151/62 | HR 65 | Temp 97.8°F | Resp 18

## 2014-03-24 DIAGNOSIS — D510 Vitamin B12 deficiency anemia due to intrinsic factor deficiency: Secondary | ICD-10-CM

## 2014-03-24 LAB — CBC WITH DIFFERENTIAL/PLATELET
Basophils Absolute: 0 10*3/uL (ref 0.0–0.1)
Basophils Relative: 0 % (ref 0–1)
EOS ABS: 0 10*3/uL (ref 0.0–0.7)
EOS PCT: 1 % (ref 0–5)
HCT: 39.6 % (ref 36.0–46.0)
HEMOGLOBIN: 12.6 g/dL (ref 12.0–15.0)
LYMPHS ABS: 1.3 10*3/uL (ref 0.7–4.0)
Lymphocytes Relative: 25 % (ref 12–46)
MCH: 31.9 pg (ref 26.0–34.0)
MCHC: 31.8 g/dL (ref 30.0–36.0)
MCV: 100.3 fL — AB (ref 78.0–100.0)
MONOS PCT: 10 % (ref 3–12)
Monocytes Absolute: 0.5 10*3/uL (ref 0.1–1.0)
NEUTROS PCT: 64 % (ref 43–77)
Neutro Abs: 3.3 10*3/uL (ref 1.7–7.7)
Platelets: 167 10*3/uL (ref 150–400)
RBC: 3.95 MIL/uL (ref 3.87–5.11)
RDW: 12.5 % (ref 11.5–15.5)
WBC: 5.2 10*3/uL (ref 4.0–10.5)

## 2014-03-24 MED ORDER — CYANOCOBALAMIN 1000 MCG/ML IJ SOLN
INTRAMUSCULAR | Status: AC
  Filled 2014-03-24: qty 1

## 2014-03-24 MED ORDER — CYANOCOBALAMIN 1000 MCG/ML IJ SOLN
1000.0000 ug | Freq: Once | INTRAMUSCULAR | Status: AC
  Administered 2014-03-24: 1000 ug via INTRAMUSCULAR

## 2014-03-24 NOTE — Progress Notes (Signed)
LABS FOR FERR CBCD 

## 2014-03-24 NOTE — Progress Notes (Signed)
[name] presents today for injection per MD orders. B12 1000 mcg administered IM in right Upper Arm. Administration without incident. Patient tolerated well.

## 2014-03-25 LAB — FERRITIN: FERRITIN: 21 ng/mL (ref 10–291)

## 2014-04-18 ENCOUNTER — Emergency Department (HOSPITAL_COMMUNITY): Payer: Medicare Other

## 2014-04-18 ENCOUNTER — Observation Stay (HOSPITAL_COMMUNITY)
Admission: EM | Admit: 2014-04-18 | Discharge: 2014-04-19 | Disposition: A | Discharge status: Home / Self Care | Payer: Medicare Other | Attending: Internal Medicine | Admitting: Internal Medicine

## 2014-04-18 ENCOUNTER — Encounter (HOSPITAL_COMMUNITY): Payer: Self-pay | Admitting: Emergency Medicine

## 2014-04-18 DIAGNOSIS — K219 Gastro-esophageal reflux disease without esophagitis: Secondary | ICD-10-CM | POA: Insufficient documentation

## 2014-04-18 DIAGNOSIS — E782 Mixed hyperlipidemia: Secondary | ICD-10-CM | POA: Diagnosis present

## 2014-04-18 DIAGNOSIS — N179 Acute kidney failure, unspecified: Secondary | ICD-10-CM

## 2014-04-18 DIAGNOSIS — I10 Essential (primary) hypertension: Secondary | ICD-10-CM | POA: Insufficient documentation

## 2014-04-18 DIAGNOSIS — E785 Hyperlipidemia, unspecified: Secondary | ICD-10-CM | POA: Diagnosis not present

## 2014-04-18 DIAGNOSIS — Z86018 Personal history of other benign neoplasm: Secondary | ICD-10-CM | POA: Insufficient documentation

## 2014-04-18 DIAGNOSIS — R55 Syncope and collapse: Secondary | ICD-10-CM

## 2014-04-18 DIAGNOSIS — D649 Anemia, unspecified: Secondary | ICD-10-CM | POA: Diagnosis not present

## 2014-04-18 DIAGNOSIS — D510 Vitamin B12 deficiency anemia due to intrinsic factor deficiency: Secondary | ICD-10-CM | POA: Insufficient documentation

## 2014-04-18 DIAGNOSIS — Z79899 Other long term (current) drug therapy: Secondary | ICD-10-CM | POA: Diagnosis not present

## 2014-04-18 DIAGNOSIS — I739 Peripheral vascular disease, unspecified: Secondary | ICD-10-CM

## 2014-04-18 DIAGNOSIS — K449 Diaphragmatic hernia without obstruction or gangrene: Secondary | ICD-10-CM | POA: Insufficient documentation

## 2014-04-18 DIAGNOSIS — K5790 Diverticulosis of intestine, part unspecified, without perforation or abscess without bleeding: Secondary | ICD-10-CM | POA: Insufficient documentation

## 2014-04-18 DIAGNOSIS — Z8781 Personal history of (healed) traumatic fracture: Secondary | ICD-10-CM | POA: Diagnosis not present

## 2014-04-18 DIAGNOSIS — I779 Disorder of arteries and arterioles, unspecified: Secondary | ICD-10-CM

## 2014-04-18 DIAGNOSIS — R413 Other amnesia: Secondary | ICD-10-CM | POA: Diagnosis present

## 2014-04-18 HISTORY — DX: Syncope and collapse: R55

## 2014-04-18 LAB — URINALYSIS, ROUTINE W REFLEX MICROSCOPIC
Bilirubin Urine: NEGATIVE
Glucose, UA: NEGATIVE mg/dL
HGB URINE DIPSTICK: NEGATIVE
Ketones, ur: NEGATIVE mg/dL
Nitrite: NEGATIVE
PH: 6 (ref 5.0–8.0)
Protein, ur: NEGATIVE mg/dL
SPECIFIC GRAVITY, URINE: 1.025 (ref 1.005–1.030)
Urobilinogen, UA: 0.2 mg/dL (ref 0.0–1.0)

## 2014-04-18 LAB — URINE MICROSCOPIC-ADD ON

## 2014-04-18 LAB — BASIC METABOLIC PANEL
Anion gap: 10 (ref 5–15)
BUN: 22 mg/dL (ref 6–23)
CO2: 25 mmol/L (ref 19–32)
Calcium: 9.2 mg/dL (ref 8.4–10.5)
Chloride: 105 mmol/L (ref 96–112)
Creatinine, Ser: 1.2 mg/dL — ABNORMAL HIGH (ref 0.50–1.10)
GFR calc Af Amer: 45 mL/min — ABNORMAL LOW (ref >90)
GFR, EST NON AFRICAN AMERICAN: 39 mL/min — AB (ref >90)
Glucose, Bld: 148 mg/dL — ABNORMAL HIGH (ref 70–99)
POTASSIUM: 3.9 mmol/L (ref 3.5–5.1)
SODIUM: 140 mmol/L (ref 135–145)

## 2014-04-18 LAB — CBC
HEMATOCRIT: 40 % (ref 36.0–46.0)
HEMOGLOBIN: 12.6 g/dL (ref 12.0–15.0)
MCH: 31.5 pg (ref 26.0–34.0)
MCHC: 31.5 g/dL (ref 30.0–36.0)
MCV: 100 fL (ref 78.0–100.0)
Platelets: 157 10*3/uL (ref 150–400)
RBC: 4 MIL/uL (ref 3.87–5.11)
RDW: 12.6 % (ref 11.5–15.5)
WBC: 8.8 10*3/uL (ref 4.0–10.5)

## 2014-04-18 LAB — TROPONIN I

## 2014-04-18 MED ORDER — ENOXAPARIN SODIUM 30 MG/0.3ML ~~LOC~~ SOLN
30.0000 mg | SUBCUTANEOUS | Status: DC
  Administered 2014-04-18: 30 mg via SUBCUTANEOUS
  Filled 2014-04-18: qty 0.3

## 2014-04-18 MED ORDER — SODIUM CHLORIDE 0.9 % IV SOLN
INTRAVENOUS | Status: DC
  Administered 2014-04-18: 16:00:00 via INTRAVENOUS

## 2014-04-18 MED ORDER — SIMVASTATIN 20 MG PO TABS
40.0000 mg | ORAL_TABLET | Freq: Every day | ORAL | Status: DC
  Administered 2014-04-18: 40 mg via ORAL
  Filled 2014-04-18: qty 2

## 2014-04-18 MED ORDER — ONDANSETRON HCL 4 MG PO TABS: 4.0000 mg | ORAL_TABLET | Freq: Four times a day (QID) | ORAL | Status: DC | PRN

## 2014-04-18 MED ORDER — FAMOTIDINE 20 MG PO TABS
20.0000 mg | ORAL_TABLET | Freq: Every day | ORAL | Status: DC
  Administered 2014-04-19: 20 mg via ORAL
  Filled 2014-04-18: qty 1

## 2014-04-18 MED ORDER — SODIUM CHLORIDE 0.9 % IV SOLN
INTRAVENOUS | Status: DC
  Administered 2014-04-18: 1 mL via INTRAVENOUS

## 2014-04-18 MED ORDER — ACETAMINOPHEN 650 MG RE SUPP: 650.0000 mg | Freq: Four times a day (QID) | RECTAL | Status: DC | PRN

## 2014-04-18 MED ORDER — FAMOTIDINE 20 MG PO TABS: 20.0000 mg | ORAL_TABLET | Freq: Every day | ORAL | Status: DC

## 2014-04-18 MED ORDER — LORATADINE 10 MG PO TABS
10.0000 mg | ORAL_TABLET | Freq: Every day | ORAL | Status: DC
  Administered 2014-04-18 – 2014-04-19 (×2): 10 mg via ORAL
  Filled 2014-04-18 (×2): qty 1

## 2014-04-18 MED ORDER — SODIUM CHLORIDE 0.9 % IV SOLN: INTRAVENOUS | Status: AC

## 2014-04-18 MED ORDER — SODIUM CHLORIDE 0.9 % IJ SOLN: 3.0000 mL | Freq: Two times a day (BID) | INTRAMUSCULAR | Status: DC

## 2014-04-18 MED ORDER — ONDANSETRON HCL 4 MG/2ML IJ SOLN: 4.0000 mg | Freq: Four times a day (QID) | INTRAMUSCULAR | Status: DC | PRN

## 2014-04-18 MED ORDER — FELODIPINE ER 5 MG PO TB24
5.0000 mg | ORAL_TABLET | Freq: Every day | ORAL | Status: DC
  Administered 2014-04-19: 5 mg via ORAL
  Filled 2014-04-18 (×2): qty 1

## 2014-04-18 MED ORDER — OXYCODONE HCL 5 MG PO TABS: 5.0000 mg | ORAL_TABLET | ORAL | Status: DC | PRN

## 2014-04-18 MED ORDER — ACETAMINOPHEN 325 MG PO TABS
650.0000 mg | ORAL_TABLET | Freq: Four times a day (QID) | ORAL | Status: DC | PRN
  Administered 2014-04-18: 650 mg via ORAL
  Filled 2014-04-18: qty 2

## 2014-04-18 MED ORDER — ENOXAPARIN SODIUM 40 MG/0.4ML ~~LOC~~ SOLN
40.0000 mg | SUBCUTANEOUS | Status: DC
Start: 2014-04-18 — End: 2014-04-18

## 2014-04-18 NOTE — ED Notes (Signed)
Call placed to floor to give report- no answer at this time .

## 2014-04-18 NOTE — H&P (Signed)
[facility] History and Physical  [name] FAO:[id number] DOB: [birth year] DOA: 04/18/2014  Referring physician: Dr Clarene Duke - [facility] PCP: [name], MD   Chief Complaint: Syncope  HPI: [name] is a 81 y.o. female  Single episode of syncopetoday at 19. Witnessed by family. Patient reports getting up and leaving [employer] after eating a meal and while ambulating to her car she felt lightheaded and then passed out. She was caught by a bystander and did not hit her head.Denies any loss of bowel or bladder function or tongue biting.  Patient has had several of these over the past year per family. Patient lives at home. Family has encouraged patient to come to the hospital in the past for these but patient has refused. Last episode occurred while standing in the church choir. Patient passed out but immediately got back up and continued to sing with the choir.Patient expressing strong desire to leave the hospital at this time is she feels at her baseline. Denies any recent illnesses.  Review of Systems:  Constitutional:  No weight loss, night sweats, Fevers, chills, fatigue.  HEENT:  No headaches, Difficulty swallowing,Tooth/dental problems,Sore throat,  No sneezing, itching, ear ache, nasal congestion, post nasal drip,  Cardio-vascular:  No chest pain, Orthopnea, PND, swelling in lower extremities, anasarca, dizziness, palpitations  GI:  No heartburn, indigestion, abdominal pain, nausea, vomiting, diarrhea, change in bowel habits, loss of appetite  Resp:   No shortness of breath with exertion or at rest. No excess mucus, no productive cough, No non-productive cough, No coughing up of blood.No change in color of mucus.No wheezing.No chest wall deformity  Skin:  no rash or lesions.  GU:  no dysuria, change in color of urine, no urgency or frequency. No flank pain.  Musculoskeletal:   No joint pain or swelling. No decreased range of motion. No back pain.  Psych:  No  change in mood or affect. No depression or anxiety. No memory loss.   Past Medical History  Diagnosis Date  . Hiatal hernia   . HTN (hypertension)   . Hyperlipidemia   . Diverticulosis   . Carcinoid tumor of stomach 2008  . Adult BMI 19-24 kg/sq m 2008 125 lbs  . Anemia     Pernicious 2008-HB 12 MCV 119.5; JAN 2012 HB 13.2 MCV 97.6  . GERD (gastroesophageal reflux disease)   . Pernicious anemia   . Wrist fracture, left 2008  . Pernicious anemia 06/14/2010  . Mild memory loss 02/15/2011   Past Surgical History  Procedure Laterality Date  . Colonoscopy  2003 LS    DC/[place] Diverticulosis  . Eus  2008 DJ    FNA NO CARCINOID  . Eus  JAN 2009    NL EXAM, NO Bx  . Upper gastrointestinal endoscopy  APR 2008 SLF    CARCIONOID  . Upper gastrointestinal endoscopy  FEB 2011 SLF    CARCINOID/mild anemia/large hiatal hernia  . Eus  MAR 2011 WO    CARCINOID   Social History:  reports that she has never smoked. She does not have any smokeless tobacco history on file. She reports that she does not drink alcohol or use illicit drugs.  Allergies  Allergen Reactions  . Codeine     Family History  Problem Relation Age of Onset  . Colon cancer Neg Hx   . Colon polyps Neg Hx      Prior to Admission medications   Medication Sig Start Date End Date Taking? Authorizing Provider  acetaminophen (  TYLENOL) 500 MG tablet Take 500 mg by mouth every 6 (six) hours as needed. For pain   Yes Historical Provider, MD  calcium-vitamin D (OSCAL WITH D) 500-200 MG-UNIT per tablet Take 1 tablet by mouth daily.     Yes Historical Provider, MD  cetirizine (ZYRTEC) 10 MG tablet Take 10 mg by mouth daily.   Yes Historical Provider, MD  Cyanocobalamin (VITAMIN B-12 IJ) Inject 1,000 mcg as directed every 30 (thirty) days.     Yes Historical Provider, MD  famotidine (PEPCID) 20 MG tablet Take 20 mg by mouth daily.    Yes Historical Provider, MD  felodipine (PLENDIL) 5 MG 24 hr tablet Take 5 mg by mouth daily.    Yes  Historical Provider, MD  simvastatin (ZOCOR) 40 MG tablet Take 40 mg by mouth at bedtime.     Yes Historical Provider, MD   Physical Exam: Filed Vitals:   04/18/14 1600 04/18/14 1602 04/18/14 1630 04/18/14 1730  BP: 158/76 164/92 148/87 167/78  Pulse: 85 96 53 90  Temp:      TempSrc:      Resp: 15 25 18 14   Height:      Weight:      SpO2: 99% 100% 40% 96%    Wt Readings from Last 3 Encounters:  04/18/14 49.896 kg (110 lb)  08/13/13 52.98 kg (116 lb 12.8 oz)  08/20/12 53.343 kg (117 lb 9.6 oz)    General: Elderly but very active. Appears calm and comfortable Eyes:  PERRL, normal lids, irises & conjunctiva ENT:  grossly normal hearing, lips & tongue Neck:  no LAD, masses or thyromegaly Cardiovascular:  RRR, no m/r/g. No LE edema. Telemetry:  SR, no arrhythmias  Respiratory:  CTA bilaterally, no w/r/r. Normal respiratory effort. Abdomen:  soft, ntnd Skin:  no rash or induration seen on limited exam Musculoskeletal:  grossly normal tone BUE/BLE Psychiatric:  grossly normal mood and affect, speech fluent and appropriate Neurologic:  grossly non-focal.          Labs on Admission:  Basic Metabolic Panel:  Recent Labs Lab 04/18/14 1416  NA 140  K 3.9  CL 105  CO2 25  GLUCOSE 148*  BUN 22  CREATININE 1.20*  CALCIUM 9.2   Liver Function Tests: No results for input(s): AST, ALT, ALKPHOS, BILITOT, PROT, ALBUMIN in the last 168 hours. No results for input(s): LIPASE, AMYLASE in the last 168 hours. No results for input(s): AMMONIA in the last 168 hours. CBC:  Recent Labs Lab 04/18/14 1416  WBC 8.8  HGB 12.6  HCT 40.0  MCV 100.0  PLT 157   Cardiac Enzymes:  Recent Labs Lab 04/18/14 1416  TROPONINI <0.03    BNP (last 3 results) No results for input(s): BNP in the last 8760 hours.  ProBNP (last 3 results) No results for input(s): PROBNP in the last 8760 hours.  CBG: No results for input(s): GLUCAP in the last 168 hours.  Radiological Exams on  Admission: Dg Chest 2 View  04/18/2014   CLINICAL DATA:  Loss of consciousness.  EXAM: CHEST  2 VIEW  COMPARISON:  01/14/2012  FINDINGS: The cardiac silhouette appears mildly enlarged. There is a large hiatal hernia, mildly larger than on the prior study. The lungs remain hyperinflated. No airspace consolidation, edema, pleural effusion, or pneumothorax is identified. The bones appear osteopenic. No acute osseous abnormality is identified.  IMPRESSION: No active cardiopulmonary disease.  Large hiatal hernia.   Electronically Signed   By: Sebastian Ache  On: 04/18/2014 15:50   Ct Head Wo Contrast  04/18/2014   CLINICAL DATA:  Patient with hx HTN passed out in [employer] parking lot.Similar episode approximately 2 years ago  EXAM: CT HEAD WITHOUT CONTRAST  TECHNIQUE: Contiguous axial images were obtained from the base of the skull through the vertex without intravenous contrast.  COMPARISON:  01/14/2012  FINDINGS: Ventricles are normal in configuration. There is age related ventricular and sulcal enlargement. No hydrocephalus.  There are no parenchymal masses or mass effect. There is no evidence of a cortical infarct. Mild periventricular white matter hypoattenuation is noted consistent with chronic microvascular ischemic change.  There are no extra-axial masses or abnormal fluid collections.  No intracranial hemorrhage.  Visualized sinuses and mastoid air cells are clear. No skull lesion.  IMPRESSION: 1. No acute intracranial abnormalities. 2. Age related volume loss. Mild chronic microvascular ischemic change. No significant change from the prior study.   Electronically Signed   By: Amie Portland M.D.   On: 04/18/2014 15:46    EKG: Independently reviewed. 1 degree AV block, no ACS,   Assessment/Plan Principal Problem:   Syncope Active Problems:   HLD (hyperlipidemia)   Essential hypertension   GERD   Mild memory loss   AKI (acute kidney injury)  Syncope: Likely intermittent arrhythmia. First degree  AV block noted in EKG which is a new finding. Some dehydration as noted with elevation in creatinine and per family stating that patient drinks very little but orthostatic vital signs were normal. No metabolic derangement noted. No sign of infection. Unlikely seizure. Head CT negative for acute process - Telemetry - IV fluids - consider cardiology consult - Echo - PT/OT  AKI: likely secondary to dehydration w/ underlying CKD.  - IVF - BMET in am  HTN: some lability in pressures but motstly elevated - continue Felodipine  HLD:  - Decreased  GERD:  - continue pepcid  Memory loss: This appears to be something that is driven more so by family as patient was very adamant that she did not have this and answered all questions appropriately. There is no question that patient has full capacity. Patient lives by herself and appears to do very well.   Code Status: FULL DVT Prophylaxis: Lovenox Family Communication: Son and sister Disposition Plan: pending improvement  Malky Rudzinski Shela Commons, MD Family Medicine [facility] [email or web address] Password [email or web address]

## 2014-04-18 NOTE — ED Notes (Signed)
Pt is sitting in chair next to stretcher due to back being sore from sitting /lying on stretcher- Ambulated to bathroom   had BM , ambulated with stand by assistance - no co voiced during ambulation

## 2014-04-18 NOTE — ED Provider Notes (Signed)
CSN: [id number]     Arrival date & time 04/18/14  1334 History   First MD Initiated Contact with Patient 04/18/14 1417     Chief Complaint  Patient presents with  . Loss of Consciousness     HPI Pt was seen at 1430. Per pt and her family, c/o sudden onset and resolution of one episode of syncope that occurred PTA. Pt states she finished eating at a fast food restaurant and was walking back to her car when she felt "lightheaded." EMS found pt on the ground. No reported seizure activity, no incont of bowel or bladder. Pt's family states pt has had frequent syncopal episodes within the past year, but has not been evaluated for same. Denies CP/palpitations, no SOB/cough, no abd pain, no N/V/D, no neck or back pain, no focal motor weakness, no tingling/numbness in extremities.    Past Medical History  Diagnosis Date  . Hiatal hernia   . HTN (hypertension)   . Hyperlipidemia   . Diverticulosis   . Carcinoid tumor of stomach 2008  . Adult BMI 19-24 kg/sq m 2008 125 lbs  . Anemia     Pernicious 2008-HB 12 MCV 119.5; JAN 2012 HB 13.2 MCV 97.6  . GERD (gastroesophageal reflux disease)   . Pernicious anemia   . Wrist fracture, left 2008  . Pernicious anemia 06/14/2010  . Mild memory loss 02/15/2011   Past Surgical History  Procedure Laterality Date  . Colonoscopy  2003 LS    DC/[place] Diverticulosis  . Eus  2008 DJ    FNA NO CARCINOID  . Eus  JAN 2009    NL EXAM, NO Bx  . Upper gastrointestinal endoscopy  APR 2008 SLF    CARCIONOID  . Upper gastrointestinal endoscopy  FEB 2011 SLF    CARCINOID/mild anemia/large hiatal hernia  . Eus  [facility] 2011 WO    CARCINOID   Family History  Problem Relation Age of Onset  . Colon cancer Neg Hx   . Colon polyps Neg Hx    History  Substance Use Topics  . Smoking status: Never Smoker   . Smokeless tobacco: Not on file  . Alcohol Use: No    Review of Systems ROS: Statement: All systems negative except as marked or noted in the HPI; Constitutional:  Negative for fever and chills. ; ; Eyes: Negative for eye pain, redness and discharge. ; ; ENMT: Negative for ear pain, hoarseness, nasal congestion, sinus pressure and sore throat. ; ; Cardiovascular: Negative for chest pain, palpitations, diaphoresis, dyspnea and peripheral edema. ; ; Respiratory: Negative for cough, wheezing and stridor. ; ; Gastrointestinal: Negative for nausea, vomiting, diarrhea, abdominal pain, blood in stool, hematemesis, jaundice and rectal bleeding. . ; ; Genitourinary: Negative for dysuria, flank pain and hematuria. ; ; Musculoskeletal: Negative for back pain and neck pain. Negative for swelling and trauma.; ; Skin: Negative for pruritus, rash, abrasions, blisters, bruising and skin lesion.; ; Neuro: Negative for headache and neck stiffness. Negative for weakness, extremity weakness, paresthesias, involuntary movement, seizure and +lightheadedness, +syncope.      Allergies  Codeine  Home Medications   Prior to Admission medications   Medication Sig Start Date End Date Taking? Authorizing Provider  acetaminophen (TYLENOL) 500 MG tablet Take 500 mg by mouth every 6 (six) hours as needed. For pain   Yes Historical Provider, MD  calcium-vitamin D (OSCAL WITH D) 500-200 MG-UNIT per tablet Take 1 tablet by mouth daily.     Yes Historical Provider, MD  cetirizine (  ZYRTEC) 10 MG tablet Take 10 mg by mouth daily.   Yes Historical Provider, MD  Cyanocobalamin (VITAMIN B-12 IJ) Inject 1,000 mcg as directed every 30 (thirty) days.     Yes Historical Provider, MD  famotidine (PEPCID) 20 MG tablet Take 20 mg by mouth daily.    Yes Historical Provider, MD  felodipine (PLENDIL) 5 MG 24 hr tablet Take 5 mg by mouth daily.    Yes Historical Provider, MD  simvastatin (ZOCOR) 40 MG tablet Take 40 mg by mouth at bedtime.     Yes Historical Provider, MD   BP 121/66 mmHg  Pulse 86  Temp(Src) 98 F (36.7 C) (Oral)  Resp 17  Ht 5\' 2"  (1.575 m)  Wt 110 lb (49.896 kg)  BMI 20.11 kg/m2   SpO2 100%   16:05:38 Orthostatic Vital Signs EJ  Orthostatic Lying  - BP- Lying: 156/77 mmHg ; Pulse- Lying: 86  Orthostatic Sitting - BP- Sitting: 158/76 mmHg ; Pulse- Sitting: 88  Orthostatic Standing at 0 minutes - BP- Standing at 0 minutes: 164/92 mmHg ; Pulse- Standing at 0 minutes: 97      Physical Exam 1435: Physical examination:  Nursing notes reviewed; Vital signs and O2 SAT reviewed;  Constitutional: Well developed, Well nourished, Well hydrated, In no acute distress; Head:  Normocephalic, atraumatic; Eyes: EOMI, PERRL, No scleral icterus; ENMT: Mouth and pharynx normal, Mucous membranes moist; Neck: Supple, Full range of motion, No lymphadenopathy; Cardiovascular: Regular rate and rhythm, No gallop; Respiratory: Breath sounds clear & equal bilaterally, No wheezes.  Speaking full sentences with ease, Normal respiratory effort/excursion; Chest: Nontender, Movement normal; Abdomen: Soft, Nontender, Nondistended, Normal bowel sounds; Genitourinary: No CVA tenderness; Spine:  No midline CS, TS, LS tenderness.;; Extremities: Pulses normal, No tenderness, No edema, No calf edema or asymmetry.; Neuro: AA&Ox3, Major CN grossly intact. Speech clear.  No facial droop.  No nystagmus. Grips equal. Strength 5/5 equal bilat UE's and LE's.  DTR 2/4 equal bilat UE's and LE's.  No gross sensory deficits.  Normal cerebellar testing bilat UE's (finger-nose) and LE's (heel-shin).; Skin: Color normal, Warm, Dry.   ED Course  Procedures     EKG Interpretation   Date/Time:  Monday April 18 2014 15:19:24 EDT Ventricular Rate:  90 PR Interval:  236 QRS Duration: 79 QT Interval:  387 QTC Calculation: 473 R Axis:   72 Text Interpretation:  Sinus rhythm with 1st degree A-V block Biatrial  enlargement Anteroseptal infarct, age indeterminate When compared with ECG  of 01/14/2012 First degree A-V block is now Present Otherwise no  significant change Confirmed by [facility]  MD, Nunzio Cory [phone]) on   04/18/2014 3:42:54 PM      MDM  MDM Reviewed: previous chart, nursing note and vitals Reviewed previous: labs and ECG Interpretation: labs, ECG, x-ray and CT scan     Results for orders placed or performed during the hospital encounter of 04/18/14  CBC  (at [facility])  Result Value Ref Range   WBC 8.8 4.0 - 10.5 K/uL   RBC 4.00 3.87 - 5.11 MIL/uL   Hemoglobin 12.6 12.0 - 15.0 g/dL   HCT 40.0 36.0 - 46.0 %   MCV 100.0 78.0 - 100.0 fL   MCH 31.5 26.0 - 34.0 pg   MCHC 31.5 30.0 - 36.0 g/dL   RDW 12.6 11.5 - 15.5 %   Platelets 157 150 - 400 K/uL  Basic metabolic panel  (at [facility])  Result Value Ref Range   Sodium  140 135 - 145 mmol/L   Potassium 3.9 3.5 - 5.1 mmol/L   Chloride 105 96 - 112 mmol/L   CO2 25 19 - 32 mmol/L   Glucose, Bld 148 (H) 70 - 99 mg/dL   BUN 22 6 - 23 mg/dL   Creatinine, Ser 1.20 (H) 0.50 - 1.10 mg/dL   Calcium 9.2 8.4 - 10.5 mg/dL   GFR calc non Af Amer 39 (L) >90 mL/min   GFR calc Af Amer 45 (L) >90 mL/min   Anion gap 10 5 - 15  Troponin I  Result Value Ref Range   Troponin I <0.03 <0.031 ng/mL  Urinalysis, Routine w reflex microscopic  Result Value Ref Range   Color, Urine YELLOW YELLOW   APPearance CLEAR CLEAR   Specific Gravity, Urine 1.025 1.005 - 1.030   pH 6.0 5.0 - 8.0   Glucose, UA NEGATIVE NEGATIVE mg/dL   Hgb urine dipstick NEGATIVE NEGATIVE   Bilirubin Urine NEGATIVE NEGATIVE   Ketones, ur NEGATIVE NEGATIVE mg/dL   Protein, ur NEGATIVE NEGATIVE mg/dL   Urobilinogen, UA 0.2 0.0 - 1.0 mg/dL   Nitrite NEGATIVE NEGATIVE   Leukocytes, UA TRACE (A) NEGATIVE  Urine microscopic-add on  Result Value Ref Range   Squamous Epithelial / LPF FEW (A) RARE   WBC, UA 3-6 <3 WBC/hpf   Bacteria, UA FEW (A) RARE   Dg Chest 2 View 04/18/2014   CLINICAL DATA:  Loss of consciousness.  EXAM: CHEST  2 VIEW  COMPARISON:  01/14/2012  FINDINGS: The cardiac silhouette appears mildly enlarged. There is a large hiatal hernia, mildly  larger than on the prior study. The lungs remain hyperinflated. No airspace consolidation, edema, pleural effusion, or pneumothorax is identified. The bones appear osteopenic. No acute osseous abnormality is identified.  IMPRESSION: No active cardiopulmonary disease.  Large hiatal hernia.   Electronically Signed   By: Logan Bores   On: 04/18/2014 15:50   Ct Head Wo Contrast 04/18/2014   CLINICAL DATA:  Patient with hx HTN passed out in [employer] parking lot.Similar episode approximately 2 years ago  EXAM: CT HEAD WITHOUT CONTRAST  TECHNIQUE: Contiguous axial images were obtained from the base of the skull through the vertex without intravenous contrast.  COMPARISON:  01/14/2012  FINDINGS: Ventricles are normal in configuration. There is age related ventricular and sulcal enlargement. No hydrocephalus.  There are no parenchymal masses or mass effect. There is no evidence of a cortical infarct. Mild periventricular white matter hypoattenuation is noted consistent with chronic microvascular ischemic change.  There are no extra-axial masses or abnormal fluid collections.  No intracranial hemorrhage.  Visualized sinuses and mastoid air cells are clear. No skull lesion.  IMPRESSION: 1. No acute intracranial abnormalities. 2. Age related volume loss. Mild chronic microvascular ischemic change. No significant change from the prior study.   Electronically Signed   By: Lajean Manes M.D.   On: 04/18/2014 15:46    Results for MAGHAN, JESSEE (MRN [id number]) as of 04/18/2014 17:17  Ref. Range 02/12/2013 10:30 08/13/2013 10:03 01/27/2014 10:55 04/18/2014 14:16  BUN Latest Range: 6-23 mg/dL 19 21 20 22   Creatinine Latest Range: 0.50-1.10 mg/dL 1.07 1.10 1.10 1.20 (H)    1710:  Pt is not orthostatic on VS. BUN/Cr elevated from previous; will continue judicious IVF. Dx and testing d/w pt and family.  Questions answered.  Verb understanding, agreeable to admit.  T/C to [facility] Dr. Marily Memos, case discussed, including:  HPI,  pertinent PM/SHx, VS/PE, dx testing, ED course and  treatment:  Agreeable to admit, requests to write temporary orders, obtain observation tele bed.      Francine Graven, DO 04/20/14 1407

## 2014-04-18 NOTE — ED Notes (Signed)
Passed out at [employer] parking lot and was seen by EMS.  VSS and Glucose good per son.  Son wanted to get her checked out.  This has happened a couple years ago.

## 2014-04-19 ENCOUNTER — Observation Stay (HOSPITAL_COMMUNITY): Payer: Medicare Other

## 2014-04-19 DIAGNOSIS — I779 Disorder of arteries and arterioles, unspecified: Secondary | ICD-10-CM | POA: Diagnosis not present

## 2014-04-19 DIAGNOSIS — I739 Peripheral vascular disease, unspecified: Secondary | ICD-10-CM

## 2014-04-19 DIAGNOSIS — N179 Acute kidney failure, unspecified: Secondary | ICD-10-CM | POA: Diagnosis not present

## 2014-04-19 DIAGNOSIS — R55 Syncope and collapse: Secondary | ICD-10-CM | POA: Diagnosis not present

## 2014-04-19 DIAGNOSIS — I10 Essential (primary) hypertension: Secondary | ICD-10-CM | POA: Diagnosis not present

## 2014-04-19 LAB — COMPREHENSIVE METABOLIC PANEL
ALK PHOS: 72 U/L (ref 39–117)
ALT: 9 U/L (ref 0–35)
ANION GAP: 7 (ref 5–15)
AST: 18 U/L (ref 0–37)
Albumin: 3.4 g/dL — ABNORMAL LOW (ref 3.5–5.2)
BUN: 17 mg/dL (ref 6–23)
CHLORIDE: 110 mmol/L (ref 96–112)
CO2: 26 mmol/L (ref 19–32)
CREATININE: 0.88 mg/dL (ref 0.50–1.10)
Calcium: 8.7 mg/dL (ref 8.4–10.5)
GFR calc Af Amer: 65 mL/min — ABNORMAL LOW (ref >90)
GFR, EST NON AFRICAN AMERICAN: 56 mL/min — AB (ref >90)
GLUCOSE: 91 mg/dL (ref 70–99)
Potassium: 3.6 mmol/L (ref 3.5–5.1)
Sodium: 143 mmol/L (ref 135–145)
TOTAL PROTEIN: 6.4 g/dL (ref 6.0–8.3)
Total Bilirubin: 0.7 mg/dL (ref 0.3–1.2)

## 2014-04-19 LAB — URINE CULTURE: Colony Count: >=100000

## 2014-04-19 LAB — CBC
HCT: 34.1 % — ABNORMAL LOW (ref 36.0–46.0)
Hemoglobin: 10.8 g/dL — ABNORMAL LOW (ref 12.0–15.0)
MCH: 31.5 pg (ref 26.0–34.0)
MCHC: 31.7 g/dL (ref 30.0–36.0)
MCV: 99.4 fL (ref 78.0–100.0)
Platelets: 168 10*3/uL (ref 150–400)
RBC: 3.43 MIL/uL — ABNORMAL LOW (ref 3.87–5.11)
RDW: 12.8 % (ref 11.5–15.5)
WBC: 4.8 10*3/uL (ref 4.0–10.5)

## 2014-04-19 MED ORDER — ENOXAPARIN SODIUM 40 MG/0.4ML ~~LOC~~ SOLN: 40.0000 mg | SUBCUTANEOUS | Status: DC

## 2014-04-19 NOTE — Evaluation (Signed)
Physical Therapy Evaluation Patient Details Name: [name] MRN: [id number] DOB: 09-Mar-1922 Today's Date: 04/19/2014   History of Present Illness  Pt is a 81 y/o female admitted with single episode of syncopetoday on 04-18-14, at 1030. Witnessed by family. Patient reports getting up and leaving [employer] after eating a meal and while ambulating to her car she felt lightheaded and then passed out. She was caught by a bystander and did not hit her head.Denies any loss of bowel or bladder function or tongue biting.  Patient has had several of these over the past year per family. Patient lives at home. Family has encouraged patient to come to the hospital in the past for these but patient has refused. Last episode occurred while standing in the church choir. Patient passed out but immediately got back up and continued to sing with the choir.Patient expressing strong desire to leave the hospital at this time is she feels at her baseline. Denies any recent illnesses.  Clinical Impression  Patient presented in bed, very pleasant and alert, states she is generally at baseline level of function. Able to participate in therapy session today without difficulty; see PT eval for details regarding levels of assist for mobility. Able to perform toileting task with distant supervision from PT. Gait approximately 362ft with no AD, supervision; mild fatigue noted at end of gait distance. Throughout session patient did require intermittent cues to maintain safety of IV line, but safety awareness otherwise intact. Patient is not in need of skilled PT services during her stay at this facility; she is overall at her baseline level of function and is also not in need of further PT services after the time of her discharge from this facility.     Follow Up Recommendations No PT follow up    Equipment Recommendations  None recommended by PT    Recommendations for Other Services       Precautions / Restrictions  Precautions Precautions: Fall Restrictions Weight Bearing Restrictions: No      Mobility  Bed Mobility Overal bed mobility: Independent Bed Mobility: Supine to Sit;Sit to Supine     Supine to sit: Independent Sit to supine: Independent      Transfers Overall transfer level: Modified independent Equipment used: None Transfers: Sit to/from Stand Sit to Stand: Supervision         General transfer comment: cues for safety managing IV line  Ambulation/Gait Ambulation/Gait assistance: Supervision Ambulation Distance (Feet): 350 Feet Assistive device: None Gait Pattern/deviations: Decreased step length - left;Decreased step length - right;Decreased dorsiflexion - right;Decreased dorsiflexion - left   Gait velocity interpretation: at or above normal speed for age/gender General Gait Details: mild fatigue after gait  Stairs            Wheelchair Mobility    Modified Rankin (Stroke Patients Only)       Balance Overall balance assessment: No apparent balance deficits (not formally assessed)                                           Pertinent Vitals/Pain Pain Assessment: No/denies pain    Home Living Family/patient expects to be discharged to:: Private residence Living Arrangements: Alone Available Help at Discharge: Family;Available PRN/intermittently Type of Home: House Home Access: Stairs to enter Entrance Stairs-Rails: Can reach both Entrance Stairs-Number of Steps: 3 Home Layout: One level Home Equipment: Hand held shower head Additional  Comments: Pt son lives close by and checks on pt every day    Prior Function Level of Independence: Independent               Hand Dominance   Dominant Hand: Right    Extremity/Trunk Assessment   Upper Extremity Assessment: Overall WFL for tasks assessed           Lower Extremity Assessment: Overall WFL for tasks assessed      Cervical / Trunk Assessment: Kyphotic (mild  kyphosis)  Communication   Communication: No difficulties  Cognition Arousal/Alertness: Awake/alert Behavior During Therapy: WFL for tasks assessed/performed Overall Cognitive Status: Within Functional Limits for tasks assessed                      General Comments      Exercises        Assessment/Plan    PT Assessment Patent does not need any further PT services  PT Diagnosis     PT Problem List    PT Treatment Interventions     PT Goals (Current goals can be found in the Care Plan section) Acute Rehab PT Goals Patient Stated Goal: none stated PT Goal Formulation: With patient Time For Goal Achievement: 05/03/14 Potential to Achieve Goals: Good    Frequency     Barriers to discharge        Co-evaluation               End of Session Equipment Utilized During Treatment: Gait belt Activity Tolerance: Patient tolerated treatment well Patient left: in bed;with call bell/phone within reach;with bed alarm set;with family/visitor present      Functional Assessment Tool Used: Skilled clinical assessment, based on bed mobility, transfers, and gait ability Functional Limitation: Mobility: Walking and moving around Mobility: Walking and Moving Around Current Status [phone]): At least 1 percent but less than 20 percent impaired, limited or restricted Mobility: Walking and Moving Around Goal Status [phone]): At least 1 percent but less than 20 percent impaired, limited or restricted Mobility: Walking and Moving Around Discharge Status [phone]): At least 1 percent but less than 20 percent impaired, limited or restricted    Time: 0952-1011 PT Time Calculation (min) (ACUTE ONLY): 19 min   Charges:   PT Evaluation $Initial PT Evaluation Tier I: 1 Procedure     PT G Codes:   PT G-Codes **NOT FOR INPATIENT CLASS** Functional Assessment Tool Used: Skilled clinical assessment, based on bed mobility, transfers, and gait ability Functional Limitation: Mobility:  Walking and moving around Mobility: Walking and Moving Around Current Status (D7412): At least 1 percent but less than 20 percent impaired, limited or restricted Mobility: Walking and Moving Around Goal Status [phone]): At least 1 percent but less than 20 percent impaired, limited or restricted Mobility: Walking and Moving Around Discharge Status [phone]): At least 1 percent but less than 20 percent impaired, limited or restricted   Deniece Ree PT, DPT [phone]

## 2014-04-19 NOTE — Evaluation (Signed)
Occupational Therapy Evaluation Patient Details Name: [name] MRN: [id number] DOB: 1922-11-10 Today's Date: 04/19/2014    History of Present Illness Pt is a 81 y/o female admitted with single episode of syncope today on 04-18-14, at 1030. Witnessed by family. Patient reports getting up and leaving [employer] after eating a meal and while ambulating to her car she felt lightheaded and then passed out. She was caught by a bystander and did not hit her head.Denies any loss of bowel or bladder function or tongue biting.  Patient has had several of these over the past year per family. Patient lives at home. Family has encouraged patient to come to the hospital in the past for these but patient has refused. Last episode occurred while standing in the church choir. Patient passed out but immediately got back up and continued to sing with the choir.Patient expressing strong desire to leave the hospital at this time is she feels at her baseline. Denies any recent illnesses.   Clinical Impression   PTA pt lived at home alone, with family checking in on her every day. Pt demonstrates independence in bed mobility, supine<>sit, and requires supervision for sit<>stand transfer due to history of syncope episodes. Pt demonstrates independence in lower body dressing tasks. Pt demonstrates good range of motion and strength (4-/5) in BUE. Pt reports independence in B/IADLs including driving. Educated pt on using additional time during tasks requiring position change (bed mobility, sit<>stand) and during functional mobility to increase awareness of dizziness/feeling lightheaded and syncope episodes. Pt appears to be at baseline, no further acute OT services required at this time.     Follow Up Recommendations  No OT follow up;Supervision - Intermittent    Equipment Recommendations  None recommended by OT       Precautions / Restrictions Precautions Precautions: Fall Restrictions Weight Bearing Restrictions:  No      Mobility Bed Mobility Overal bed mobility: Independent Bed Mobility: Supine to Sit;Sit to Supine     Supine to sit: Independent Sit to supine: Independent      Transfers Overall transfer level: Modified independent   Transfers: Sit to/from Stand Sit to Stand: Supervision                   ADL Overall ADL's : Modified independent;At baseline                     Lower Body Dressing: Independent               Functional mobility during ADLs: Modified independent General ADL Comments: Pt reports independence in all B/IADLs, using additional time if necessary     Vision Vision Assessment?: Yes Eye Alignment: Within Functional Limits Ocular Range of Motion: Within Functional Limits Alignment/Gaze Preference: Within Defined Limits Tracking/Visual Pursuits: Able to track stimulus in all quads without difficulty Saccades: Within functional limits Convergence: Within functional limits Visual Fields: No apparent deficits          Pertinent Vitals/Pain Pain Assessment: No/denies pain     Hand Dominance Right   Extremity/Trunk Assessment Upper Extremity Assessment Upper Extremity Assessment: Overall WFL for tasks assessed   Lower Extremity Assessment Lower Extremity Assessment: Defer to PT evaluation       Communication Communication Communication: No difficulties   Cognition Arousal/Alertness: Awake/alert Behavior During Therapy: WFL for tasks assessed/performed Overall Cognitive Status: Within Functional Limits for tasks assessed  Home Living Family/patient expects to be discharged to:: Private residence Living Arrangements: Alone Available Help at Discharge: Family;Available PRN/intermittently Type of Home: House             Bathroom Shower/Tub: Tub/shower unit;Curtain   Bathroom Toilet: Standard     Home Equipment: Hand held shower head   Additional Comments: Pt son lives  close by and checks on pt every day      Prior Functioning/Environment Level of Independence: Independent             OT Diagnosis: Generalized weakness (at baseline)   OT Problem List: Decreased strength    End of Session Equipment Utilized During Treatment: Gait belt;Rolling walker  Activity Tolerance: Patient tolerated treatment well Patient left: in bed;with bed alarm set;with call bell/phone within reach;with nursing/sitter in room   Time: 0822-0842 OT Time Calculation (min): 20 min Charges:  OT General Charges $OT Visit: 1 Procedure OT Evaluation $Initial OT Evaluation Tier I: 1 Procedure G-Codes: OT G-codes **NOT FOR INPATIENT CLASS** Functional Assessment Tool Used: Clinical judgement Functional Limitation: Self care Self Care Current Status (P3825): At least 1 percent but less than 20 percent impaired, limited or restricted Self Care Goal Status (K5397): At least 1 percent but less than 20 percent impaired, limited or restricted Self Care Discharge Status [phone]): At least 1 percent but less than 20 percent impaired, limited or restricted   Guadelupe Sabin, OTR/L  [phone]  04/19/2014, 9:24 AM

## 2014-04-19 NOTE — Care Management Note (Signed)
    Page 1 of 1   04/19/2014     1:46:09 PM CARE MANAGEMENT NOTE 04/19/2014  Patient:  [name], [name]   Account Number:  [id number]  Date Initiated:  04/19/2014  Documentation initiated by:  Theophilus Kinds  Subjective/Objective Assessment:   Pt admitted from home with syncope. Pt lives alone and will return home at discharge. Pt is independent with ADL's. Pt has a big support system with her son and extended family members.     Action/Plan:   No Cm needs noted. Anticipate discharge within 24 hours.   Anticipated DC Date:  04/20/2014   Anticipated DC Plan:  [place]  CM consult      Choice offered to / List presented to:             Status of service:  Completed, signed off Medicare Important Message given?   (If response is "NO", the following Medicare IM given date fields will be blank) Date Medicare IM given:   Medicare IM given by:   Date Additional Medicare IM given:   Additional Medicare IM given by:    Discharge Disposition:  HOME/SELF CARE  Per UR Regulation:    If discussed at Long Length of Stay Meetings, dates discussed:    Comments:  04/19/14 Brooke, RN BSN CM

## 2014-04-19 NOTE — Progress Notes (Signed)
  Echocardiogram 2D Echocardiogram has been performed.  Samuel Germany 04/19/2014, 1:04 PM

## 2014-04-19 NOTE — Progress Notes (Signed)
Patient discharged home today with son.  Patient and son were given discharge instructions, and verbalized understanding with no complaints or concerns voiced at this time.  IV was removed with catheter intact, no bleeding or complications.  Tele monitor was removed prior to discharge.  Patient left unit in stable condition, ambulating, by a staff member.

## 2014-04-19 NOTE — Discharge Instructions (Signed)
Stop by the [facility] tomorrow to have the event monitor placed.

## 2014-04-19 NOTE — Progress Notes (Signed)
UR completed 

## 2014-04-19 NOTE — Discharge Summary (Signed)
Physician Discharge Summary  [name] IPJ:[id number] DOB: 09/13/1922 DOA: 04/18/2014  PCP: [name], MD  Admit date: 04/18/2014 Discharge date: 04/19/2014  Time spent: 45 minutes  Recommendations for Outpatient Follow-up:  -Will be discharged home today. -Advised to follow up with PCP in 2 weeks. -Will need to have event monitor placed at the [facility] tomorrow.   Discharge Diagnoses:  Principal Problem:   Syncope Active Problems:   HLD (hyperlipidemia)   Essential hypertension   GERD   Mild memory loss   AKI (acute kidney injury)   Carotid disease, bilateral   Discharge Condition: Stable and improved  Filed Weights   04/18/14 1342 04/18/14 2020  Weight: 49.896 kg (110 lb) 53.751 kg (118 lb 8 oz)    History of present illness:  [name] is a 81 y.o. female  Single episode of syncopetoday at 29. Witnessed by family. Patient reports getting up and leaving [employer] after eating a meal and while ambulating to her car she felt lightheaded and then passed out. She was caught by a bystander and did not hit her head.Denies any loss of bowel or bladder function or tongue biting. Patient has had several of these over the past year per family. Patient lives at home. Family has encouraged patient to come to the hospital in the past for these but patient has refused. Last episode occurred while standing in the church choir. Patient passed out but immediately got back up and continued to sing with the choir.Patient expressing strong desire to leave the hospital at this time is she feels at her baseline. Denies any recent illnesses.  Hospital Course:   Recurrent Syncope -No arrhythmias on tele. -Did have a first degree block on initial EKG. -CT Head negative and no focal deficits to suggest CVA. -Ruled out for ACS. -ECHO:Left ventricle: The cavity size was normal. Wall thickness was increased in a pattern of mild LVH. Systolic function was normal. The  estimated ejection fraction was in the range of 60% to 65%. Wall motion was normal; there were no regional wall motion abnormalities. Doppler parameters are consistent with abnormal left ventricular relaxation (grade 1 diastolic dysfunction). No aortic stenosis. -Carotid dopplers:50-69% stenosis in the right internal carotid artery.  Less than 50% stenosis in the left internal carotid artery.  -Will arrange for her to have an event monitor placed as I suspect most likely etiology for her recurrent syncope is a transient arrhythmia.   Carotid Disease -Will need to be followed with a repeat carotid doppler in 6 months.  ARF -Resolved with IVF. -Cr 0.88 on DC.  Procedures:  As above   Consultations:  None  Discharge Instructions  Discharge Instructions    Increase activity slowly    Complete by:  As directed             Medication List    TAKE these medications        acetaminophen 500 MG tablet  Commonly known as:  TYLENOL  Take 500 mg by mouth every 6 (six) hours as needed. For pain     calcium-vitamin D 500-200 MG-UNIT per tablet  Commonly known as:  OSCAL WITH D  Take 1 tablet by mouth daily.     cetirizine 10 MG tablet  Commonly known as:  ZYRTEC  Take 10 mg by mouth daily.     famotidine 20 MG tablet  Commonly known as:  PEPCID  Take 20 mg by mouth daily.     felodipine 5  MG 24 hr tablet  Commonly known as:  PLENDIL  Take 5 mg by mouth daily.     simvastatin 40 MG tablet  Commonly known as:  ZOCOR  Take 40 mg by mouth at bedtime.     VITAMIN B-12 IJ  Inject 1,000 mcg as directed every 30 (thirty) days.       Allergies  Allergen Reactions  . Codeine        Follow-up Information    Follow up with [facility], MD. Schedule an appointment as soon as possible for a visit in 2 weeks.   Specialty:  Family Medicine   Contact information:   [address] [place] [address] [phone]        The results of significant  diagnostics from this hospitalization (including imaging, microbiology, ancillary and laboratory) are listed below for reference.    Significant Diagnostic Studies: Dg Chest 2 View  04/18/2014   CLINICAL DATA:  Loss of consciousness.  EXAM: CHEST  2 VIEW  COMPARISON:  01/14/2012  FINDINGS: The cardiac silhouette appears mildly enlarged. There is a large hiatal hernia, mildly larger than on the prior study. The lungs remain hyperinflated. No airspace consolidation, edema, pleural effusion, or pneumothorax is identified. The bones appear osteopenic. No acute osseous abnormality is identified.  IMPRESSION: No active cardiopulmonary disease.  Large hiatal hernia.   Electronically Signed   By: Logan Bores   On: 04/18/2014 15:50   Ct Head Wo Contrast  04/18/2014   CLINICAL DATA:  Patient with hx HTN passed out in [place] parking lot.Similar episode approximately 2 years ago  EXAM: CT HEAD WITHOUT CONTRAST  TECHNIQUE: Contiguous axial images were obtained from the base of the skull through the vertex without intravenous contrast.  COMPARISON:  01/14/2012  FINDINGS: Ventricles are normal in configuration. There is age related ventricular and sulcal enlargement. No hydrocephalus.  There are no parenchymal masses or mass effect. There is no evidence of a cortical infarct. Mild periventricular white matter hypoattenuation is noted consistent with chronic microvascular ischemic change.  There are no extra-axial masses or abnormal fluid collections.  No intracranial hemorrhage.  Visualized sinuses and mastoid air cells are clear. No skull lesion.  IMPRESSION: 1. No acute intracranial abnormalities. 2. Age related volume loss. Mild chronic microvascular ischemic change. No significant change from the prior study.   Electronically Signed   By: Lajean Manes M.D.   On: 04/18/2014 15:46   US Carotid Bilateral  04/19/2014   CLINICAL DATA:  Syncope  EXAM: BILATERAL CAROTID DUPLEX ULTRASOUND  TECHNIQUE: Pearline Cables scale imaging,  color Doppler and duplex ultrasound were performed of bilateral carotid and vertebral arteries in the neck.  COMPARISON:  None.  FINDINGS: Criteria: Quantification of carotid stenosis is based on velocity parameters that correlate the residual internal carotid diameter with NASCET-based stenosis levels, using the diameter of the distal internal carotid lumen as the denominator for stenosis measurement.  The following velocity measurements were obtained:  RIGHT  ICA:  149 cm/sec  CCA:  83 cm/sec  SYSTOLIC ICA/CCA RATIO:  1.8  DIASTOLIC ICA/CCA RATIO:  ECA:  275 cm/sec  LEFT  ICA:  117 cm/sec  CCA:  87 cm/sec  SYSTOLIC ICA/CCA RATIO:  1.4  DIASTOLIC ICA/CCA RATIO:  ECA:  127 cm/sec  RIGHT CAROTID ARTERY: Scattered calcified plaque in the mid common carotid. Moderate focal calcified plaque in the bulb. Low resistance internal carotid Doppler pattern.  RIGHT VERTEBRAL ARTERY:  Antegrade with a normal Doppler pattern.  LEFT CAROTID ARTERY: Scattered calcified plaque in the left common carotid. Mild calcified plaque in the bulb. Low resistance internal carotid Doppler pattern.  LEFT VERTEBRAL ARTERY:  Antegrade with a normal Doppler pattern.  IMPRESSION: 50-69% stenosis in the right internal carotid artery.  Less than 50% stenosis in the left internal carotid artery.   Electronically Signed   By: Marybelle Killings M.D.   On: 04/19/2014 13:21    Microbiology: No results found for this or any previous visit (from the past 240 hour(s)).   Labs: Basic Metabolic Panel:  Recent Labs Lab 04/18/14 1416 04/19/14 0548  NA 140 143  K 3.9 3.6  CL 105 110  CO2 25 26  GLUCOSE 148* 91  BUN 22 17  CREATININE 1.20* 0.88  CALCIUM 9.2 8.7   Liver Function Tests:  Recent Labs Lab 04/19/14 0548  AST 18  ALT 9  ALKPHOS 72  BILITOT 0.7  PROT 6.4  ALBUMIN 3.4*   No results for input(s): LIPASE, AMYLASE in the last 168 hours. No results for input(s): AMMONIA in the last 168 hours. CBC:  Recent Labs Lab  04/18/14 1416 04/19/14 0548  WBC 8.8 4.8  HGB 12.6 10.8*  HCT 40.0 34.1*  MCV 100.0 99.4  PLT 157 168   Cardiac Enzymes:  Recent Labs Lab 04/18/14 1416  TROPONINI <0.03   BNP: BNP (last 3 results) No results for input(s): BNP in the last 8760 hours.  ProBNP (last 3 results) No results for input(s): PROBNP in the last 8760 hours.  CBG: No results for input(s): GLUCAP in the last 168 hours.     SignedLelon Frohlich  [facility] Pager: [phone] 04/19/2014, 5:06 PM

## 2014-04-20 ENCOUNTER — Other Ambulatory Visit: Payer: Self-pay | Admitting: *Deleted

## 2014-04-20 DIAGNOSIS — R55 Syncope and collapse: Secondary | ICD-10-CM

## 2014-04-28 ENCOUNTER — Encounter (HOSPITAL_COMMUNITY): Payer: Medicare Other | Attending: Oncology

## 2014-04-28 ENCOUNTER — Encounter (HOSPITAL_COMMUNITY): Payer: Self-pay

## 2014-04-28 ENCOUNTER — Other Ambulatory Visit (HOSPITAL_COMMUNITY): Payer: Medicare Other

## 2014-04-28 ENCOUNTER — Encounter (HOSPITAL_COMMUNITY): Payer: Medicare Other

## 2014-04-28 DIAGNOSIS — D510 Vitamin B12 deficiency anemia due to intrinsic factor deficiency: Secondary | ICD-10-CM | POA: Diagnosis not present

## 2014-04-28 MED ORDER — CYANOCOBALAMIN 1000 MCG/ML IJ SOLN
1000.0000 ug | Freq: Once | INTRAMUSCULAR | Status: AC
  Administered 2014-04-28: 1000 ug via INTRAMUSCULAR
  Filled 2014-04-28: qty 1

## 2014-04-28 NOTE — Patient Instructions (Signed)
[place] at [facility] Discharge Instructions  RECOMMENDATIONS MADE BY THE CONSULTANT AND ANY TEST RESULTS WILL BE SENT TO YOUR REFERRING PHYSICIAN.  You received your B12 today. Call for any questions or concerns. See you next month for you next shot.  Thank you for choosing [place] at [facility] to provide your oncology and hematology care.  To afford each patient quality time with our provider, please arrive at least 15 minutes before your scheduled appointment time.    You need to re-schedule your appointment should you arrive 10 or more minutes late.  We strive to give you quality time with our providers, and arriving late affects you and other patients whose appointments are after yours.  Also, if you no show three or more times for appointments you may be dismissed from the clinic at the providers discretion.     Again, thank you for choosing [facility].  Our hope is that these requests will decrease the amount of time that you wait before being seen by our physicians.       _____________________________________________________________  Should you have questions after your visit to [facility], please contact our office at [phone] between the hours of 8:30 a.m. and 4:30 p.m.  Voicemails left after 4:30 p.m. will not be returned until the following business day.  For prescription refill requests, have your pharmacy contact our office.

## 2014-04-28 NOTE — Progress Notes (Signed)
[name] presents today for injection per MD orders. B12 104mcg administered SQ in right Upper Arm. Administration without incident. Patient tolerated well.

## 2014-05-26 ENCOUNTER — Encounter (HOSPITAL_COMMUNITY): Payer: Medicare Other | Attending: Hematology & Oncology

## 2014-05-26 ENCOUNTER — Other Ambulatory Visit (HOSPITAL_COMMUNITY): Payer: Medicare Other

## 2014-05-26 VITALS — BP 151/76 | HR 91 | Temp 97.4°F | Resp 18

## 2014-05-26 DIAGNOSIS — D510 Vitamin B12 deficiency anemia due to intrinsic factor deficiency: Secondary | ICD-10-CM

## 2014-05-26 MED ORDER — CYANOCOBALAMIN 1000 MCG/ML IJ SOLN
INTRAMUSCULAR | Status: AC
  Filled 2014-05-26: qty 1

## 2014-05-26 MED ORDER — CYANOCOBALAMIN 1000 MCG/ML IJ SOLN
1000.0000 ug | Freq: Once | INTRAMUSCULAR | Status: AC
  Administered 2014-05-26: 1000 ug via INTRAMUSCULAR

## 2014-05-26 NOTE — Progress Notes (Signed)
[name] presents today for injection per MD orders. B12 1000 mcg administered IM in left Upper Arm. Administration without incident. Patient tolerated well.

## 2014-05-26 NOTE — Patient Instructions (Signed)
[place] at [facility] Discharge Instructions  RECOMMENDATIONS MADE BY THE CONSULTANT AND ANY TEST RESULTS WILL BE SENT TO YOUR REFERRING PHYSICIAN.  B12 injection today as ordered. Continue monthly B12 injections.  Return as scheduled.  Thank you for choosing [place] at [facility] to provide your oncology and hematology care.  To afford each patient quality time with our provider, please arrive at least 15 minutes before your scheduled appointment time.    You need to re-schedule your appointment should you arrive 10 or more minutes late.  We strive to give you quality time with our providers, and arriving late affects you and other patients whose appointments are after yours.  Also, if you no show three or more times for appointments you may be dismissed from the clinic at the providers discretion.     Again, thank you for choosing [facility].  Our hope is that these requests will decrease the amount of time that you wait before being seen by our physicians.       _____________________________________________________________  Should you have questions after your visit to [facility], please contact our office at [phone] between the hours of 8:30 a.m. and 4:30 p.m.  Voicemails left after 4:30 p.m. will not be returned until the following business day.  For prescription refill requests, have your pharmacy contact our office.

## 2014-06-23 ENCOUNTER — Encounter (HOSPITAL_COMMUNITY): Payer: Self-pay

## 2014-06-23 ENCOUNTER — Other Ambulatory Visit (HOSPITAL_COMMUNITY): Payer: Medicare Other

## 2014-06-23 ENCOUNTER — Encounter (HOSPITAL_COMMUNITY): Payer: Medicare Other | Attending: Oncology

## 2014-06-23 VITALS — BP 154/59 | HR 89 | Temp 97.4°F | Resp 20

## 2014-06-23 DIAGNOSIS — D510 Vitamin B12 deficiency anemia due to intrinsic factor deficiency: Secondary | ICD-10-CM

## 2014-06-23 MED ORDER — CYANOCOBALAMIN 1000 MCG/ML IJ SOLN
1000.0000 ug | Freq: Once | INTRAMUSCULAR | Status: AC
  Administered 2014-06-23: 1000 ug via INTRAMUSCULAR

## 2014-06-23 MED ORDER — CYANOCOBALAMIN 1000 MCG/ML IJ SOLN
INTRAMUSCULAR | Status: AC
  Filled 2014-06-23: qty 1

## 2014-06-23 NOTE — Patient Instructions (Signed)
[place] at [facility] Discharge Instructions  RECOMMENDATIONS MADE BY THE CONSULTANT AND ANY TEST RESULTS WILL BE SENT TO YOUR REFERRING PHYSICIAN.  You received our B12 injection today. See you at your next appointment. Call for any concerns or questions.  Thank you for choosing [place] at [facility] to provide your oncology and hematology care.  To afford each patient quality time with our provider, please arrive at least 15 minutes before your scheduled appointment time.    You need to re-schedule your appointment should you arrive 10 or more minutes late.  We strive to give you quality time with our providers, and arriving late affects you and other patients whose appointments are after yours.  Also, if you no show three or more times for appointments you may be dismissed from the clinic at the providers discretion.     Again, thank you for choosing [facility].  Our hope is that these requests will decrease the amount of time that you wait before being seen by our physicians.       _____________________________________________________________  Should you have questions after your visit to [facility], please contact our office at [phone] between the hours of 8:30 a.m. and 4:30 p.m.  Voicemails left after 4:30 p.m. will not be returned until the following business day.  For prescription refill requests, have your pharmacy contact our office.

## 2014-06-23 NOTE — Progress Notes (Signed)
[name] presents today for injection per MD orders. B12 1043mcg administered SQ in left Upper Arm. Administration without incident. Patient tolerated well.

## 2014-07-28 ENCOUNTER — Encounter (HOSPITAL_COMMUNITY): Payer: Medicare Other | Attending: Oncology

## 2014-07-28 ENCOUNTER — Other Ambulatory Visit (HOSPITAL_COMMUNITY): Payer: Medicare Other

## 2014-07-28 VITALS — BP 133/70 | HR 85 | Temp 98.1°F | Resp 16

## 2014-07-28 DIAGNOSIS — D131 Benign neoplasm of stomach: Secondary | ICD-10-CM | POA: Insufficient documentation

## 2014-07-28 DIAGNOSIS — D510 Vitamin B12 deficiency anemia due to intrinsic factor deficiency: Secondary | ICD-10-CM

## 2014-07-28 DIAGNOSIS — K5790 Diverticulosis of intestine, part unspecified, without perforation or abscess without bleeding: Secondary | ICD-10-CM | POA: Insufficient documentation

## 2014-07-28 DIAGNOSIS — K449 Diaphragmatic hernia without obstruction or gangrene: Secondary | ICD-10-CM | POA: Insufficient documentation

## 2014-07-28 DIAGNOSIS — E785 Hyperlipidemia, unspecified: Secondary | ICD-10-CM | POA: Insufficient documentation

## 2014-07-28 DIAGNOSIS — I10 Essential (primary) hypertension: Secondary | ICD-10-CM | POA: Insufficient documentation

## 2014-07-28 MED ORDER — CYANOCOBALAMIN 1000 MCG/ML IJ SOLN
INTRAMUSCULAR | Status: AC
  Filled 2014-07-28: qty 1

## 2014-07-28 MED ORDER — CYANOCOBALAMIN 1000 MCG/ML IJ SOLN
1000.0000 ug | Freq: Once | INTRAMUSCULAR | Status: AC
  Administered 2014-07-28: 1000 ug via INTRAMUSCULAR

## 2014-07-28 NOTE — Progress Notes (Signed)
[name] presents today for injection per MD orders. B12 1028mcg administered SQ in left Upper Arm. Administration without incident. Patient tolerated well.

## 2014-08-11 ENCOUNTER — Encounter (HOSPITAL_BASED_OUTPATIENT_CLINIC_OR_DEPARTMENT_OTHER): Payer: Medicare Other | Admitting: Hematology & Oncology

## 2014-08-11 ENCOUNTER — Encounter (HOSPITAL_COMMUNITY): Payer: Self-pay | Admitting: Hematology & Oncology

## 2014-08-11 ENCOUNTER — Encounter (HOSPITAL_BASED_OUTPATIENT_CLINIC_OR_DEPARTMENT_OTHER): Payer: Medicare Other

## 2014-08-11 VITALS — BP 130/55 | HR 88 | Temp 98.3°F | Resp 16 | Wt 113.8 lb

## 2014-08-11 DIAGNOSIS — D510 Vitamin B12 deficiency anemia due to intrinsic factor deficiency: Secondary | ICD-10-CM

## 2014-08-11 DIAGNOSIS — I10 Essential (primary) hypertension: Secondary | ICD-10-CM | POA: Diagnosis not present

## 2014-08-11 DIAGNOSIS — D3A092 Benign carcinoid tumor of the stomach: Secondary | ICD-10-CM

## 2014-08-11 DIAGNOSIS — E785 Hyperlipidemia, unspecified: Secondary | ICD-10-CM | POA: Diagnosis not present

## 2014-08-11 DIAGNOSIS — K449 Diaphragmatic hernia without obstruction or gangrene: Secondary | ICD-10-CM | POA: Diagnosis not present

## 2014-08-11 DIAGNOSIS — K5790 Diverticulosis of intestine, part unspecified, without perforation or abscess without bleeding: Secondary | ICD-10-CM | POA: Diagnosis not present

## 2014-08-11 DIAGNOSIS — D131 Benign neoplasm of stomach: Secondary | ICD-10-CM | POA: Diagnosis not present

## 2014-08-11 LAB — CBC WITH DIFFERENTIAL/PLATELET
BASOS ABS: 0 10*3/uL (ref 0.0–0.1)
BASOS PCT: 0 % (ref 0–1)
Eosinophils Absolute: 0.1 10*3/uL (ref 0.0–0.7)
Eosinophils Relative: 1 % (ref 0–5)
HCT: 42.4 % (ref 36.0–46.0)
Hemoglobin: 13.4 g/dL (ref 12.0–15.0)
LYMPHS ABS: 1.3 10*3/uL (ref 0.7–4.0)
Lymphocytes Relative: 23 % (ref 12–46)
MCH: 31.6 pg (ref 26.0–34.0)
MCHC: 31.6 g/dL (ref 30.0–36.0)
MCV: 100 fL (ref 78.0–100.0)
Monocytes Absolute: 0.4 10*3/uL (ref 0.1–1.0)
Monocytes Relative: 7 % (ref 3–12)
Neutro Abs: 3.9 10*3/uL (ref 1.7–7.7)
Neutrophils Relative %: 69 % (ref 43–77)
Platelets: 208 10*3/uL (ref 150–400)
RBC: 4.24 MIL/uL (ref 3.87–5.11)
RDW: 12.6 % (ref 11.5–15.5)
WBC: 5.7 10*3/uL (ref 4.0–10.5)

## 2014-08-11 LAB — COMPREHENSIVE METABOLIC PANEL
ALK PHOS: 108 U/L (ref 38–126)
ALT: 34 U/L (ref 14–54)
ANION GAP: 10 (ref 5–15)
AST: 33 U/L (ref 15–41)
Albumin: 4.5 g/dL (ref 3.5–5.0)
BILIRUBIN TOTAL: 0.7 mg/dL (ref 0.3–1.2)
BUN: 18 mg/dL (ref 6–20)
CHLORIDE: 106 mmol/L (ref 101–111)
CO2: 25 mmol/L (ref 22–32)
CREATININE: 1.04 mg/dL — AB (ref 0.44–1.00)
Calcium: 9.4 mg/dL (ref 8.9–10.3)
GFR calc Af Amer: 53 mL/min — ABNORMAL LOW (ref >60)
GFR calc non Af Amer: 46 mL/min — ABNORMAL LOW (ref >60)
Glucose, Bld: 118 mg/dL — ABNORMAL HIGH (ref 65–99)
Potassium: 3.6 mmol/L (ref 3.5–5.1)
Sodium: 141 mmol/L (ref 135–145)
Total Protein: 8.4 g/dL — ABNORMAL HIGH (ref 6.5–8.1)

## 2014-08-11 NOTE — Progress Notes (Signed)
LABS DRAWN

## 2014-08-11 NOTE — Patient Instructions (Signed)
[place] at [facility] Discharge Instructions  RECOMMENDATIONS MADE BY THE CONSULTANT AND ANY TEST RESULTS WILL BE SENT TO YOUR REFERRING PHYSICIAN.  Exam and discussion by Dr. Whitney Muse. Call with any concerns. Will continue B12 injections monthly  Labs and office visit in 1 year.  Thank you for choosing [place] at [facility] to provide your oncology and hematology care.  To afford each patient quality time with our provider, please arrive at least 15 minutes before your scheduled appointment time.    You need to re-schedule your appointment should you arrive 10 or more minutes late.  We strive to give you quality time with our providers, and arriving late affects you and other patients whose appointments are after yours.  Also, if you no show three or more times for appointments you may be dismissed from the clinic at the providers discretion.     Again, thank you for choosing [facility].  Our hope is that these requests will decrease the amount of time that you wait before being seen by our physicians.       _____________________________________________________________  Should you have questions after your visit to [facility], please contact our office at [phone] between the hours of 8:30 a.m. and 4:30 p.m.  Voicemails left after 4:30 p.m. will not be returned until the following business day.  For prescription refill requests, have your pharmacy contact our office.

## 2014-08-11 NOTE — Progress Notes (Signed)
Jessica Font, MD [address] [place] [address]  History of pernicious anemia Neuroendocrine carcinoid tumor of stomach  CURRENT THERAPY: Monthly B12 injections. Observation via labs of neuroendocrine carcinoid tumor of the stomach   INTERVAL HISTORY: [name] 81 y.o. 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  She denies any complaints. She denies any flushing, diarrhea, and heart palpitations.  Oncologically, she denies any complaints and ROS questioning is negative.   The patient is here alone today and is doing well.  She lives by herself. She gets her B12 shots here every month, she only has one more scheduled. The patient's mother lived to be 64 and her father lived to be 2.  She has 1 son. She says that her appetite is well and she has great energy.  She says that her family and friends call her a "busy bee". The patient is up to date on her mammogram.  They are ordered by Dr. Berdine Addison.   She denies abdominal pain and any problems with her bowels.    Past Medical History  Diagnosis Date  . Hiatal hernia   . HTN (hypertension)   . Hyperlipidemia   . Diverticulosis   . Carcinoid tumor of stomach 2008  . Adult BMI 19-24 kg/sq m 2008 125 lbs  . Anemia     Pernicious 2008-HB 12 MCV 119.5; JAN 2012 HB 13.2 MCV 97.6  . GERD (gastroesophageal reflux disease)   . Pernicious anemia   . Wrist fracture, left 2008  . Pernicious anemia 06/14/2010  . 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     is allergic to codeine.  Ms. Couse had no medications administered during this  visit.  Past Surgical History  Procedure Laterality Date  . Colonoscopy  2003 LS    DC/[place] Diverticulosis  . Eus  2008 DJ    FNA NO CARCINOID  . Eus  JAN 2009    NL EXAM, NO Bx  . Upper gastrointestinal endoscopy  APR 2008 SLF    CARCIONOID  . Upper gastrointestinal endoscopy  FEB 2011 SLF    CARCINOID/mild anemia/large hiatal hernia  . Eus  [facility] 2011 WO    CARCINOID    Denies any headaches, dizziness, double vision, fevers, chills, night sweats, nausea, vomiting, diarrhea, constipation, chest pain, heart palpitations, shortness of breath, blood in stool, black tarry stool, urinary pain, urinary burning, urinary frequency, hematuria. 14 point review of systems was performed and is negative except as detailed under history of present illness and above    PHYSICAL EXAMINATION  ECOG PERFORMANCE STATUS: 1 - Symptomatic but completely ambulatory  Filed Vitals:   08/11/14 0936  BP: 130/55  Pulse: 88  Temp: 98.3 F (36.8 C)  Resp: 16    GENERAL:alert, no distress, well nourished, well developed, comfortable, cooperative and smiling SKIN: skin color, texture, turgor are normal, no rashes or significant lesions HEAD: Normocephalic, No masses, lesions, tenderness or abnormalities EYES: normal, PERRLA, EOMI, Conjunctiva are pink and non-injected EARS: External ears normal OROPHARYNX:mucous membranes are moist  NECK: supple, no adenopathy, thyroid normal size, non-tender, without nodularity, no stridor, non-tender, trachea midline  LYMPH:  no palpable lymphadenopathy BREAST:not examined LUNGS: clear to auscultation and percussion HEART: regular rate & rhythm, no murmurs and no gallops ABDOMEN:abdomen soft and normal bowel sounds BACK: Back symmetric, no curvature. EXTREMITIES:less then 2 second capillary refill, no joint deformities, effusion, or inflammation, no skin discoloration, no clubbing, no cyanosis  NEURO: alert & oriented x 3 with fluent speech, no focal motor/sensory  deficits, gait normal   LABORATORY DATA: CBC    Component Value Date/Time   WBC 4.8 04/19/2014 0548   RBC 3.43* 04/19/2014 0548   HGB 10.8* 04/19/2014 0548   HCT 34.1* 04/19/2014 0548   PLT 168 04/19/2014 0548   MCV 99.4 04/19/2014 0548   MCH 31.5 04/19/2014 0548   MCHC 31.7 04/19/2014 0548   RDW 12.8 04/19/2014 0548   LYMPHSABS 1.3 03/24/2014 1230   MONOABS 0.5 03/24/2014 1230   EOSABS 0.0 03/24/2014 1230   BASOSABS 0.0 03/24/2014 1230      Chemistry      Component Value Date/Time   NA 143 04/19/2014 0548   K 3.6 04/19/2014 0548   CL 110 04/19/2014 0548   CO2 26 04/19/2014 0548   BUN 17 04/19/2014 0548   CREATININE 0.88 04/19/2014 0548   CREATININE 1.07 02/12/2013 1029      Component Value Date/Time   CALCIUM 8.7 04/19/2014 0548   ALKPHOS 72 04/19/2014 0548   AST 18 04/19/2014 0548   ALT 9 04/19/2014 0548   BILITOT 0.7 04/19/2014 0548     Lab Results  Component Value Date   IRON 63 08/07/2012   TIBC 327 08/07/2012   FERRITIN 21 03/24/2014   Results for DENNICE, TINDOL (MRN [id number]) as of 08/07/2013 18:29  Ref. Range 02/12/2013 10:30  Serotonin, Serum Latest Range: 56-244 ng/mL 91   Results for FRANCISCO, EYERLY (MRN [id number]) as of 08/07/2013 18:29  Ref. Range 02/12/2013 10:29  5-HIAA, 24 Hr Urine Latest Range: <=6.0 mg/24 h 6.0    ASSESSMENT:  1. Pernicious anemia, receiving monthly B12 injections with normalization of CBC.  2. 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 Declining weight over the past 3 years. Asymptomatic. 4. ANEMIA nos, new onset 03/2014  Patient Active Problem List   Diagnosis Date Noted  . Carotid disease, bilateral 04/19/2014  . Syncope 04/18/2014  . AKI (acute kidney injury) 04/18/2014  . 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 Pernicious anemia 06/14/2010  . 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 DM 04/03/2009  . HLD (hyperlipidemia)  04/03/2009  . Essential hypertension 04/03/2009  . GERD 04/03/2009  . WEAKNESS 04/03/2009    THERAPY PLAN:  She is doing very well from our standpoint and we will continue to administer monthly B12 injections and monitor lab work. We will see her back in 1 year. Her blood work today is pending, she will be notified of the results when available. She is noted to have an anemia in march that is new.  If persistent on todays blood work we will bring her back sooner for additional evaluation.  All questions were answered. The patient knows to call the clinic with any problems, questions or concerns. We can certainly see the patient much sooner if necessary.   This document serves as a record of services personally performed by Ancil Linsey, MD. It was created on her behalf by Janace Hoard, a trained medical scribe. The creation of this record is based on the  scribe's personal observations and the provider's statements to them. This document has been checked and approved by the attending provider.  I have reviewed the above documentation for accuracy and completeness, and I agree with the above.  This note was electronically signed.  Kelby Fam. Whitney Muse, MD

## 2014-08-12 ENCOUNTER — Telehealth (HOSPITAL_COMMUNITY): Payer: Self-pay | Admitting: Emergency Medicine

## 2014-08-12 NOTE — Telephone Encounter (Signed)
Notified pt that lab work was stable

## 2014-08-12 NOTE — Telephone Encounter (Signed)
-----   Message from Baird Cancer, PA-C sent at 08/11/2014  4:39 PM EDT ----- Stable.

## 2014-08-15 LAB — SEROTONIN SERUM: SEROTONIN, SERUM: 97 ng/mL (ref 0–420)

## 2014-08-25 ENCOUNTER — Encounter (HOSPITAL_COMMUNITY): Payer: Medicare Other | Attending: Hematology & Oncology

## 2014-08-25 VITALS — BP 139/51 | HR 88 | Temp 98.1°F | Resp 16

## 2014-08-25 DIAGNOSIS — D510 Vitamin B12 deficiency anemia due to intrinsic factor deficiency: Secondary | ICD-10-CM | POA: Insufficient documentation

## 2014-08-25 MED ORDER — CYANOCOBALAMIN 1000 MCG/ML IJ SOLN
1000.0000 ug | Freq: Once | INTRAMUSCULAR | Status: AC
  Administered 2014-08-25: 1000 ug via INTRAMUSCULAR

## 2014-08-25 MED ORDER — CYANOCOBALAMIN 1000 MCG/ML IJ SOLN
INTRAMUSCULAR | Status: AC
  Filled 2014-08-25: qty 1

## 2014-08-25 NOTE — Patient Instructions (Signed)
[place] at [facility] Discharge Instructions  RECOMMENDATIONS MADE BY THE CONSULTANT AND ANY TEST RESULTS WILL BE SENT TO YOUR REFERRING PHYSICIAN.  Vitamin B12 1000 mcg injection given today as ordered. Continue B12 injection monthly. Return as scheduled.  Thank you for choosing [place] at [facility] to provide your oncology and hematology care.  To afford each patient quality time with our provider, please arrive at least 15 minutes before your scheduled appointment time.    You need to re-schedule your appointment should you arrive 10 or more minutes late.  We strive to give you quality time with our providers, and arriving late affects you and other patients whose appointments are after yours.  Also, if you no show three or more times for appointments you may be dismissed from the clinic at the providers discretion.     Again, thank you for choosing [facility].  Our hope is that these requests will decrease the amount of time that you wait before being seen by our physicians.       _____________________________________________________________  Should you have questions after your visit to [facility], please contact our office at [phone] between the hours of 8:30 a.m. and 4:30 p.m.  Voicemails left after 4:30 p.m. will not be returned until the following business day.  For prescription refill requests, have your pharmacy contact our office.

## 2014-08-25 NOTE — Progress Notes (Signed)
[name] presents today for injection per MD orders. B12 1000 mcg administered IM in left Upper Arm. Administration without incident. Patient tolerated well.

## 2014-09-22 ENCOUNTER — Encounter (HOSPITAL_COMMUNITY): Payer: Self-pay

## 2014-09-22 ENCOUNTER — Encounter (HOSPITAL_COMMUNITY): Payer: Medicare Other | Attending: Hematology & Oncology

## 2014-09-22 VITALS — BP 144/71 | HR 90 | Temp 98.5°F | Resp 16

## 2014-09-22 DIAGNOSIS — D510 Vitamin B12 deficiency anemia due to intrinsic factor deficiency: Secondary | ICD-10-CM | POA: Insufficient documentation

## 2014-09-22 MED ORDER — CYANOCOBALAMIN 1000 MCG/ML IJ SOLN
1000.0000 ug | Freq: Once | INTRAMUSCULAR | Status: AC
  Administered 2014-09-22: 1000 ug via INTRAMUSCULAR

## 2014-09-22 NOTE — Progress Notes (Signed)
..  [name] presents today for injection per the provider's orders.  Vitamin b12 administration without incident; see MAR for injection details.  Patient tolerated procedure well and without incident.  No questions or complaints noted at this time.

## 2014-10-27 ENCOUNTER — Encounter (HOSPITAL_COMMUNITY): Payer: Medicare Other | Attending: Hematology & Oncology

## 2014-10-27 ENCOUNTER — Encounter (HOSPITAL_COMMUNITY): Payer: Self-pay

## 2014-10-27 VITALS — BP 144/73 | HR 90 | Temp 99.1°F | Resp 16

## 2014-10-27 DIAGNOSIS — D510 Vitamin B12 deficiency anemia due to intrinsic factor deficiency: Secondary | ICD-10-CM | POA: Insufficient documentation

## 2014-10-27 MED ORDER — CYANOCOBALAMIN 1000 MCG/ML IJ SOLN
1000.0000 ug | Freq: Once | INTRAMUSCULAR | Status: AC
  Administered 2014-10-27: 1000 ug via INTRAMUSCULAR
  Filled 2014-10-27: qty 1

## 2014-10-27 NOTE — Patient Instructions (Signed)
[place] at [facility] Discharge Instructions  RECOMMENDATIONS MADE BY THE CONSULTANT AND ANY TEST RESULTS WILL BE SENT TO YOUR REFERRING PHYSICIAN.  B12 injection today. B12 injections monthly as scheduled. Labs and office visit as scheduled.    Thank you for choosing [place] at [facility] to provide your oncology and hematology care.  To afford each patient quality time with our provider, please arrive at least 15 minutes before your scheduled appointment time.    You need to re-schedule your appointment should you arrive 10 or more minutes late.  We strive to give you quality time with our providers, and arriving late affects you and other patients whose appointments are after yours.  Also, if you no show three or more times for appointments you may be dismissed from the clinic at the providers discretion.     Again, thank you for choosing [facility].  Our hope is that these requests will decrease the amount of time that you wait before being seen by our physicians.       _____________________________________________________________  Should you have questions after your visit to [facility], please contact our office at [phone] between the hours of 8:30 a.m. and 4:30 p.m.  Voicemails left after 4:30 p.m. will not be returned until the following business day.  For prescription refill requests, have your pharmacy contact our office.

## 2014-10-27 NOTE — Progress Notes (Signed)
[name] presents today for injection per the provider's orders.  B12 administration without incident; see MAR for injection details.  Patient tolerated procedure well and without incident.  No questions or complaints noted at this time.

## 2014-11-24 ENCOUNTER — Encounter (HOSPITAL_COMMUNITY): Payer: Medicare Other | Attending: Hematology & Oncology

## 2014-11-24 ENCOUNTER — Encounter (HOSPITAL_COMMUNITY): Payer: Self-pay

## 2014-11-24 VITALS — BP 147/67 | HR 93 | Temp 98.1°F | Resp 20

## 2014-11-24 DIAGNOSIS — D510 Vitamin B12 deficiency anemia due to intrinsic factor deficiency: Secondary | ICD-10-CM | POA: Insufficient documentation

## 2014-11-24 MED ORDER — CYANOCOBALAMIN 1000 MCG/ML IJ SOLN
1000.0000 ug | Freq: Once | INTRAMUSCULAR | Status: AC
  Administered 2014-11-24: 1000 ug via INTRAMUSCULAR

## 2014-11-24 MED ORDER — CYANOCOBALAMIN 1000 MCG/ML IJ SOLN
INTRAMUSCULAR | Status: AC
  Filled 2014-11-24: qty 1

## 2014-11-24 NOTE — Patient Instructions (Signed)
[place] at [facility] Discharge Instructions  RECOMMENDATIONS MADE BY THE CONSULTANT AND ANY TEST RESULTS WILL BE SENT TO YOUR REFERRING PHYSICIAN.  B12 injection today. Return as scheduled for injections. Return as scheduled for labs and office visit.  Thank you for choosing [place] at [facility] to provide your oncology and hematology care.  To afford each patient quality time with our provider, please arrive at least 15 minutes before your scheduled appointment time.    You need to re-schedule your appointment should you arrive 10 or more minutes late.  We strive to give you quality time with our providers, and arriving late affects you and other patients whose appointments are after yours.  Also, if you no show three or more times for appointments you may be dismissed from the clinic at the providers discretion.     Again, thank you for choosing [facility].  Our hope is that these requests will decrease the amount of time that you wait before being seen by our physicians.       _____________________________________________________________  Should you have questions after your visit to [facility], please contact our office at [phone] between the hours of 8:30 a.m. and 4:30 p.m.  Voicemails left after 4:30 p.m. will not be returned until the following business day.  For prescription refill requests, have your pharmacy contact our office.

## 2014-11-24 NOTE — Progress Notes (Signed)
[name] presents today for injection per the provider's orders.  B12 administration without incident; see MAR for injection details.  Patient tolerated procedure well and without incident.  No questions or complaints noted at this time.

## 2014-12-22 ENCOUNTER — Encounter (HOSPITAL_COMMUNITY): Payer: Self-pay

## 2014-12-22 ENCOUNTER — Encounter (HOSPITAL_COMMUNITY): Payer: Medicare Other | Attending: Oncology

## 2014-12-22 VITALS — BP 166/82 | HR 102 | Temp 97.6°F | Resp 18

## 2014-12-22 DIAGNOSIS — D510 Vitamin B12 deficiency anemia due to intrinsic factor deficiency: Secondary | ICD-10-CM

## 2014-12-22 MED ORDER — CYANOCOBALAMIN 1000 MCG/ML IJ SOLN
1000.0000 ug | Freq: Once | INTRAMUSCULAR | Status: AC
  Administered 2014-12-22: 1000 ug via INTRAMUSCULAR

## 2014-12-22 NOTE — Patient Instructions (Signed)
[place] at [facility] Discharge Instructions  RECOMMENDATIONS MADE BY THE CONSULTANT AND ANY TEST RESULTS WILL BE SENT TO YOUR REFERRING PHYSICIAN.  B12 injection today. Return as scheduled for lab work, injections, and office visit.   Thank you for choosing [place] at [facility] to provide your oncology and hematology care.  To afford each patient quality time with our provider, please arrive at least 15 minutes before your scheduled appointment time.    You need to re-schedule your appointment should you arrive 10 or more minutes late.  We strive to give you quality time with our providers, and arriving late affects you and other patients whose appointments are after yours.  Also, if you no show three or more times for appointments you may be dismissed from the clinic at the providers discretion.     Again, thank you for choosing [facility].  Our hope is that these requests will decrease the amount of time that you wait before being seen by our physicians.       _____________________________________________________________  Should you have questions after your visit to [facility], please contact our office at [phone] between the hours of 8:30 a.m. and 4:30 p.m.  Voicemails left after 4:30 p.m. will not be returned until the following business day.  For prescription refill requests, have your pharmacy contact our office.

## 2014-12-22 NOTE — Progress Notes (Signed)
[name] presents today for injection per the provider's orders.  B12 administration without incident; see MAR for injection details.  Patient tolerated procedure well and without incident.  No questions or complaints noted at this time. 

## 2015-01-26 ENCOUNTER — Encounter (HOSPITAL_COMMUNITY): Payer: Medicare Other | Attending: Hematology & Oncology

## 2015-01-26 ENCOUNTER — Encounter (HOSPITAL_COMMUNITY): Payer: Self-pay

## 2015-01-26 ENCOUNTER — Other Ambulatory Visit (HOSPITAL_COMMUNITY): Payer: Self-pay | Admitting: Family Medicine

## 2015-01-26 VITALS — BP 129/49 | HR 94 | Temp 97.6°F | Resp 20

## 2015-01-26 DIAGNOSIS — Z1231 Encounter for screening mammogram for malignant neoplasm of breast: Secondary | ICD-10-CM

## 2015-01-26 DIAGNOSIS — D510 Vitamin B12 deficiency anemia due to intrinsic factor deficiency: Secondary | ICD-10-CM

## 2015-01-26 MED ORDER — CYANOCOBALAMIN 1000 MCG/ML IJ SOLN
INTRAMUSCULAR | Status: AC
  Filled 2015-01-26: qty 1

## 2015-01-26 MED ORDER — CYANOCOBALAMIN 1000 MCG/ML IJ SOLN
1000.0000 ug | Freq: Once | INTRAMUSCULAR | Status: AC
  Administered 2015-01-26: 1000 ug via INTRAMUSCULAR

## 2015-01-26 NOTE — Progress Notes (Signed)
[name] presents today for injection per MD orders. B12 1,000 mcg administered SQ in left Upper Arm. Administration without incident. Patient tolerated well.

## 2015-01-26 NOTE — Patient Instructions (Signed)
[place] at [facility] Discharge Instructions  RECOMMENDATIONS MADE BY THE CONSULTANT AND ANY TEST RESULTS WILL BE SENT TO YOUR REFERRING PHYSICIAN.  Received B12 injection today Follow up at next appointment.  Thank you for choosing [place] at [facility] to provide your oncology and hematology care.  To afford each patient quality time with our provider, please arrive at least 15 minutes before your scheduled appointment time.    You need to re-schedule your appointment should you arrive 10 or more minutes late.  We strive to give you quality time with our providers, and arriving late affects you and other patients whose appointments are after yours.  Also, if you no show three or more times for appointments you may be dismissed from the clinic at the providers discretion.     Again, thank you for choosing [facility].  Our hope is that these requests will decrease the amount of time that you wait before being seen by our physicians.       _____________________________________________________________  Should you have questions after your visit to [facility], please contact our office at [phone] between the hours of 8:30 a.m. and 4:30 p.m.  Voicemails left after 4:30 p.m. will not be returned until the following business day.  For prescription refill requests, have your pharmacy contact our office.

## 2015-02-02 ENCOUNTER — Ambulatory Visit (HOSPITAL_COMMUNITY)
Admission: RE | Admit: 2015-02-02 | Discharge: 2015-02-02 | Disposition: A | Discharge status: Home / Self Care | Payer: Medicare Other | Source: Ambulatory Visit | Attending: Family Medicine | Admitting: Family Medicine

## 2015-02-02 DIAGNOSIS — Z1231 Encounter for screening mammogram for malignant neoplasm of breast: Secondary | ICD-10-CM | POA: Diagnosis not present

## 2015-02-14 DIAGNOSIS — R609 Edema, unspecified: Secondary | ICD-10-CM | POA: Diagnosis not present

## 2015-02-14 DIAGNOSIS — E785 Hyperlipidemia, unspecified: Secondary | ICD-10-CM | POA: Diagnosis not present

## 2015-02-14 DIAGNOSIS — R600 Localized edema: Secondary | ICD-10-CM | POA: Diagnosis not present

## 2015-02-14 DIAGNOSIS — I10 Essential (primary) hypertension: Secondary | ICD-10-CM | POA: Diagnosis not present

## 2015-02-14 DIAGNOSIS — I509 Heart failure, unspecified: Secondary | ICD-10-CM | POA: Diagnosis not present

## 2015-04-17 ENCOUNTER — Encounter (HOSPITAL_COMMUNITY): Payer: Self-pay | Admitting: Emergency Medicine

## 2015-04-17 ENCOUNTER — Emergency Department (HOSPITAL_COMMUNITY)
Admission: EM | Admit: 2015-04-17 | Discharge: 2015-04-18 | Disposition: A | Discharge status: Home / Self Care | Payer: Medicare Other | Attending: Emergency Medicine | Admitting: Emergency Medicine

## 2015-04-17 DIAGNOSIS — D649 Anemia, unspecified: Secondary | ICD-10-CM

## 2015-04-17 DIAGNOSIS — I10 Essential (primary) hypertension: Secondary | ICD-10-CM | POA: Diagnosis not present

## 2015-04-17 DIAGNOSIS — E785 Hyperlipidemia, unspecified: Secondary | ICD-10-CM | POA: Diagnosis not present

## 2015-04-17 DIAGNOSIS — R55 Syncope and collapse: Secondary | ICD-10-CM | POA: Diagnosis not present

## 2015-04-17 DIAGNOSIS — H109 Unspecified conjunctivitis: Secondary | ICD-10-CM | POA: Diagnosis not present

## 2015-04-17 LAB — URINALYSIS, ROUTINE W REFLEX MICROSCOPIC
Bilirubin Urine: NEGATIVE
Glucose, UA: NEGATIVE mg/dL
Hgb urine dipstick: NEGATIVE
Ketones, ur: NEGATIVE mg/dL
LEUKOCYTES UA: NEGATIVE
NITRITE: NEGATIVE
PH: 8 (ref 5.0–8.0)
Protein, ur: NEGATIVE mg/dL
SPECIFIC GRAVITY, URINE: 1.005 (ref 1.005–1.030)

## 2015-04-17 LAB — CBC WITH DIFFERENTIAL/PLATELET
Basophils Absolute: 0 10*3/uL (ref 0.0–0.1)
Basophils Relative: 0 %
EOS ABS: 0.1 10*3/uL (ref 0.0–0.7)
Eosinophils Relative: 2 %
HCT: 30.2 % — ABNORMAL LOW (ref 36.0–46.0)
Hemoglobin: 9.2 g/dL — ABNORMAL LOW (ref 12.0–15.0)
LYMPHS ABS: 0.6 10*3/uL — AB (ref 0.7–4.0)
LYMPHS PCT: 9 %
MCH: 28.4 pg (ref 26.0–34.0)
MCHC: 30.5 g/dL (ref 30.0–36.0)
MCV: 93.2 fL (ref 78.0–100.0)
MONOS PCT: 5 %
Monocytes Absolute: 0.3 10*3/uL (ref 0.1–1.0)
NEUTROS ABS: 5.4 10*3/uL (ref 1.7–7.7)
NEUTROS PCT: 84 %
PLATELETS: 245 10*3/uL (ref 150–400)
RBC: 3.24 MIL/uL — AB (ref 3.87–5.11)
RDW: 13.5 % (ref 11.5–15.5)
SMEAR REVIEW: ADEQUATE
WBC: 6.4 10*3/uL (ref 4.0–10.5)

## 2015-04-17 LAB — COMPREHENSIVE METABOLIC PANEL
ALBUMIN: 4.3 g/dL (ref 3.5–5.0)
ALT: 12 U/L — AB (ref 14–54)
ANION GAP: 10 (ref 5–15)
AST: 28 U/L (ref 15–41)
Alkaline Phosphatase: 82 U/L (ref 38–126)
BUN: 25 mg/dL — ABNORMAL HIGH (ref 6–20)
CHLORIDE: 105 mmol/L (ref 101–111)
CO2: 25 mmol/L (ref 22–32)
CREATININE: 1.17 mg/dL — AB (ref 0.44–1.00)
Calcium: 9 mg/dL (ref 8.9–10.3)
GFR calc non Af Amer: 39 mL/min — ABNORMAL LOW (ref >60)
GFR, EST AFRICAN AMERICAN: 46 mL/min — AB (ref >60)
GLUCOSE: 106 mg/dL — AB (ref 65–99)
Potassium: 4.1 mmol/L (ref 3.5–5.1)
SODIUM: 140 mmol/L (ref 135–145)
Total Bilirubin: 0.4 mg/dL (ref 0.3–1.2)
Total Protein: 8.2 g/dL — ABNORMAL HIGH (ref 6.5–8.1)

## 2015-04-17 LAB — CBG MONITORING, ED: Glucose-Capillary: 102 mg/dL — ABNORMAL HIGH (ref 65–99)

## 2015-04-17 NOTE — ED Notes (Signed)
Per son, patient had syncopal episode today. Fire department was called and patient refused transport at that time. Patient is alert/oriented x 4. CBG 102

## 2015-04-18 ENCOUNTER — Emergency Department (HOSPITAL_COMMUNITY): Payer: Medicare Other

## 2015-04-18 DIAGNOSIS — R55 Syncope and collapse: Secondary | ICD-10-CM | POA: Diagnosis not present

## 2015-04-18 DIAGNOSIS — Z23 Encounter for immunization: Secondary | ICD-10-CM | POA: Diagnosis not present

## 2015-04-18 LAB — POC OCCULT BLOOD, ED: Fecal Occult Bld: NEGATIVE

## 2015-04-18 LAB — I-STAT TROPONIN, ED: TROPONIN I, POC: 0.01 ng/mL (ref 0.00–0.08)

## 2015-04-18 MED ORDER — SODIUM CHLORIDE 0.9 % IV BOLUS (SEPSIS)
1000.0000 mL | Freq: Once | INTRAVENOUS | Status: AC
  Administered 2015-04-18: 1000 mL via INTRAVENOUS

## 2015-04-18 MED ORDER — POLYMYXIN B-TRIMETHOPRIM 10000-0.1 UNIT/ML-% OP SOLN: 1.0000 [drp] | OPHTHALMIC | Status: DC

## 2015-04-18 NOTE — ED Notes (Signed)
Called ray Brogdon (son) to notify that pt is being d/c to home.  Pt son will be here in 1 hour to pick pt up.

## 2015-04-18 NOTE — ED Provider Notes (Signed)
CSN: [id number]     Arrival date & time 04/17/15  1649 History   First MD Initiated Contact with Patient 04/18/15 0012     Chief Complaint  Patient presents with  . Loss of Consciousness     (Consider location/radiation/quality/duration/timing/severity/associated sxs/prior Treatment) HPI  This is a 82 year old female with a history of hypertension, hyperlipidemia, carcinoid tumor, anemia who presents with syncope. Per the patient's son, patient had a syncopal episode today. She drove herself to the gas station. Events surrounding the syncopal episodes are unclear. Patient did report that she felt dizzy earlier today but is unsure exactly what happened at the gas station. No one is here that witnessed the event. EMS was reportedly called and patient refused transfer. She drove herself home. Her son subsequently drove her here. Currently the patient states that she "feels fine." She denies any chest pain or shortness of breath during this episode. She denies any ongoing dizziness. She denies any headache. Son reports that she had a similar episode last year for which she was hospitalized. She wore a Holter monitor for 1 month with no arrhythmias. Patient denies any bloody stools or dark stools. Denies any urinary symptoms.  Past Medical History  Diagnosis Date  . Hiatal hernia   . HTN (hypertension)   . Hyperlipidemia   . Diverticulosis   . Carcinoid tumor of stomach 2008  . Adult BMI 19-24 kg/sq m 2008 125 lbs  . Anemia     Pernicious 2008-HB 12 MCV 119.5; JAN 2012 HB 13.2 MCV 97.6  . GERD (gastroesophageal reflux disease)   . Pernicious anemia   . Wrist fracture, left 2008  . Pernicious anemia 06/14/2010  . Mild memory loss 02/15/2011   Past Surgical History  Procedure Laterality Date  . Colonoscopy  2003 LS    DC/[place] Diverticulosis  . Eus  2008 DJ    FNA NO CARCINOID  . Eus  JAN 2009    NL EXAM, NO Bx  . Upper gastrointestinal endoscopy  APR 2008 [facility]    CARCIONOID  . Upper  gastrointestinal endoscopy  FEB 2011 [facility]    CARCINOID/mild anemia/large hiatal hernia  . Eus  [facility] 2011 WO    CARCINOID   Family History  Problem Relation Age of Onset  . Colon cancer Neg Hx   . Colon polyps Neg Hx    Social History  Substance Use Topics  . Smoking status: Never Smoker   . Smokeless tobacco: Never Used  . Alcohol Use: No   OB History    No data available     Review of Systems  Constitutional: Negative for fever.  Respiratory: Negative for chest tightness and shortness of breath.   Cardiovascular: Negative for chest pain.  Gastrointestinal: Negative for nausea, vomiting and abdominal pain.  Genitourinary: Negative for dysuria.  Neurological: Positive for dizziness and syncope. Negative for headaches.  Psychiatric/Behavioral: Negative for confusion.  All other systems reviewed and are negative.     Allergies  Codeine  Home Medications   Prior to Admission medications   Medication Sig Start Date End Date Taking? Authorizing Provider  acetaminophen (TYLENOL) 500 MG tablet Take 500 mg by mouth every 6 (six) hours as needed. For pain    Historical Provider, MD  ADVANCED FIBER COMPLEX PO Take by mouth 2 (two) times daily. Takes a gummy twice daily    Historical Provider, MD  calcium carbonate (TUMS - DOSED IN MG ELEMENTAL CALCIUM) 500 MG chewable tablet Chew 2 tablets by mouth daily.  Historical Provider, MD  cetirizine (ZYRTEC) 10 MG tablet Take 10 mg by mouth daily.    Historical Provider, MD  Cyanocobalamin (VITAMIN B-12 IJ) Inject 1,000 mcg as directed every 30 (thirty) days.      Historical Provider, MD  donepezil (ARICEPT) 5 MG tablet Take 5 mg by mouth at bedtime.    Historical Provider, MD  famotidine (PEPCID) 20 MG tablet Take 20 mg by mouth daily.     Historical Provider, MD  felodipine (PLENDIL) 5 MG 24 hr tablet Take 5 mg by mouth daily.     Historical Provider, MD  simvastatin (ZOCOR) 40 MG tablet Take 40 mg by mouth at bedtime.      Historical  Provider, MD  trimethoprim-polymyxin b (POLYTRIM) ophthalmic solution Place 1 drop into the right eye every 4 (four) hours. 04/18/15   Merryl Hacker, MD   BP 149/63 mmHg  Pulse 85  Temp(Src) 98.2 F (36.8 C) (Oral)  Resp 12  Ht 5\' 1"  (1.549 m)  Wt 113 lb (51.256 kg)  BMI 21.36 kg/m2  SpO2 100% Physical Exam  Constitutional: She is oriented to person, place, and time. No distress.  Elderly, thin, no acute distress  HENT:  Head: Normocephalic and atraumatic.  Mucous membranes dry  Eyes: Pupils are equal, round, and reactive to light.  Injected conjunctiva right eye, no obvious discharge  Neck: Neck supple.  No midline C-spine tenderness  Cardiovascular: Normal rate, regular rhythm and normal heart sounds.   No murmur heard. Pulmonary/Chest: Effort normal and breath sounds normal. No respiratory distress. She has no wheezes.  Abdominal: Soft. Bowel sounds are normal. There is no tenderness. There is no rebound.  Genitourinary: Guaiac negative stool.  Normal rectal tone, hard stool in the rectal vault  Musculoskeletal: She exhibits no edema.  Neurological: She is alert and oriented to person, place, and time. No cranial nerve deficit.  Cranial nerves II through XII intact, no dysmetria to finger-nose-finger, 5 out of 5 strength in all 4 extremities  Skin: Skin is warm and dry.  Psychiatric: She has a normal mood and affect.  Nursing note and vitals reviewed.   ED Course  Procedures (including critical care time) Labs Review Labs Reviewed  CBC WITH DIFFERENTIAL/PLATELET - Abnormal; Notable for the following:    RBC 3.24 (*)    Hemoglobin 9.2 (*)    HCT 30.2 (*)    Lymphs Abs 0.6 (*)    All other components within normal limits  COMPREHENSIVE METABOLIC PANEL - Abnormal; Notable for the following:    Glucose, Bld 106 (*)    BUN 25 (*)    Creatinine, Ser 1.17 (*)    Total Protein 8.2 (*)    ALT 12 (*)    GFR calc non Af Amer 39 (*)    GFR calc Af Amer 46 (*)    All  other components within normal limits  CBG MONITORING, ED - Abnormal; Notable for the following:    Glucose-Capillary 102 (*)    All other components within normal limits  URINALYSIS, ROUTINE W REFLEX MICROSCOPIC (NOT AT [facility])  CBG MONITORING, ED  Randolm Idol, ED  POC OCCULT BLOOD, ED    Imaging Review Ct Head Wo Contrast  04/18/2015  CLINICAL DATA:  Status post syncope.  Initial encounter. EXAM: CT HEAD WITHOUT CONTRAST TECHNIQUE: Contiguous axial images were obtained from the base of the skull through the vertex without intravenous contrast. COMPARISON:  CT of the head performed 04/18/2014 FINDINGS: There is no evidence  of acute infarction, mass lesion, or intra- or extra-axial hemorrhage on CT. Prominence of the ventricles and sulci reflects mild cortical volume loss. Mild cerebellar atrophy is noted. Scattered periventricular white matter change likely reflects small vessel ischemic microangiopathy. The brainstem and fourth ventricle are within normal limits. The basal ganglia are unremarkable in appearance. The cerebral hemispheres demonstrate grossly normal gray-white differentiation. No mass effect or midline shift is seen. There is no evidence of fracture; visualized osseous structures are unremarkable in appearance. The orbits are within normal limits. The paranasal sinuses and mastoid air cells are well-aerated. No significant soft tissue abnormalities are seen. IMPRESSION: 1. No acute intracranial pathology seen on CT. 2. Mild cortical volume loss and scattered small vessel ischemic microangiopathy. Electronically Signed   By: Garald Balding M.D.   On: 04/18/2015 01:58   I have personally reviewed and evaluated these images and lab results as part of my medical decision-making.   EKG Interpretation   Date/Time:  Tuesday April 18 2015 00:53:21 EDT Ventricular Rate:  94 PR Interval:  226 QRS Duration: 86 QT Interval:  369 QTC Calculation: 461 R Axis:   73 Text Interpretation:   Sinus rhythm Prolonged PR interval Biatrial  enlargement Anterior infarct, old Confirmed by Shanieka Blea  MD, [place]  [id number]) on 04/18/2015 2:38:04 AM      MDM   Final diagnoses:  Syncope, unspecified syncope type  Anemia, unspecified anemia type  Conjunctivitis of right eye    Patient presents with syncope. Unclear events surrounding the episode. She has been sitting in the waiting room for 7 hours and since the episode has been asymptomatic. She is nontoxic. She was complaining of dizziness earlier today but that has improved. She appears to be at her baseline. Neurologically intact. EKG is unchanged and shows no evidence of arrhythmia. Lab work is largely unremarkable. She does have a hemoglobin of 9.2.  Patient recent hemoglobin 8 months ago was 13.4. She does have a history of pernicious anemia. Hemoccult was negative. She is not orthostatic. She was given fluids. Per [facility] syncope rules, patient is relatively low risk. Patient has had prior negative Holter monitoring. She may be mildly dehydrated. At this time, the hospital is very full and we are holding patients in the ER. She has already been observe for approximately 10 hours.  I will hold her to the end of the shift and continue to monitor. If at that time monitoring is done for uneventful, will discharge home with close primary care follow-up.  6:46 AM Patient monitored for approximately 14 hours. No recurrent syncope. No evidence of arrhythmia on the monitor. Will discharge home with close primary follow-up.  Merryl Hacker, MD 04/18/15 [phone]

## 2015-04-18 NOTE — Discharge Instructions (Signed)
You were seen today for an episode of loss of consciousness. Your workup is largely reassuring. You do have evidence of anemia. You need to follow-up very closely with your primary physician. Given the you previously had a Holter monitor with no event, this may not be necessary again. However, would have you follow-up with her primary doctor for further evaluation.  Syncope Syncope is a medical term for fainting or passing out. This means you lose consciousness and drop to the ground. People are generally unconscious for less than 5 minutes. You may have some muscle twitches for up to 15 seconds before waking up and returning to normal. Syncope occurs more often in older adults, but it can happen to anyone. While most causes of syncope are not dangerous, syncope can be a sign of a serious medical problem. It is important to seek medical care.  CAUSES  Syncope is caused by a sudden drop in blood flow to the brain. The specific cause is often not determined. Factors that can bring on syncope include:  Taking medicines that lower blood pressure.  Sudden changes in posture, such as standing up quickly.  Taking more medicine than prescribed.  Standing in one place for too long.  Seizure disorders.  Dehydration and excessive exposure to heat.  Low blood sugar (hypoglycemia).  Straining to have a bowel movement.  Heart disease, irregular heartbeat, or other circulatory problems.  Fear, emotional distress, seeing blood, or severe pain. SYMPTOMS  Right before fainting, you may:  Feel dizzy or light-headed.  Feel nauseous.  See all white or all black in your field of vision.  Have cold, clammy skin. DIAGNOSIS  Your health care provider will ask about your symptoms, perform a physical exam, and perform an electrocardiogram (ECG) to record the electrical activity of your heart. Your health care provider may also perform other heart or blood tests to determine the cause of your syncope which  may include:  Transthoracic echocardiogram (TTE). During echocardiography, sound waves are used to evaluate how blood flows through your heart.  Transesophageal echocardiogram (TEE).  Cardiac monitoring. This allows your health care provider to monitor your heart rate and rhythm in real time.  Holter monitor. This is a portable device that records your heartbeat and can help diagnose heart arrhythmias. It allows your health care provider to track your heart activity for several days, if needed.  Stress tests by exercise or by giving medicine that makes the heart beat faster. TREATMENT  In most cases, no treatment is needed. Depending on the cause of your syncope, your health care provider may recommend changing or stopping some of your medicines. HOME CARE INSTRUCTIONS  Have someone stay with you until you feel stable.  Do not drive, use machinery, or play sports until your health care provider says it is okay.  Keep all follow-up appointments as directed by your health care provider.  Lie down right away if you start feeling like you might faint. Breathe deeply and steadily. Wait until all the symptoms have passed.  Drink enough fluids to keep your urine clear or pale yellow.  If you are taking blood pressure or heart medicine, get up slowly and take several minutes to sit and then stand. This can reduce dizziness. SEEK IMMEDIATE MEDICAL CARE IF:   You have a severe headache.  You have unusual pain in the chest, abdomen, or back.  You are bleeding from your mouth or rectum, or you have black or tarry stool.  You have an irregular  or very fast heartbeat.  You have pain with breathing.  You have repeated fainting or seizure-like jerking during an episode.  You faint when sitting or lying down.  You have confusion.  You have trouble walking.  You have severe weakness.  You have vision problems. If you fainted, call your local emergency services (911 in [place] Do not  drive yourself to the hospital.    This information is not intended to replace advice given to you by your health care provider. Make sure you discuss any questions you have with your health care provider.   Document Released: 01/07/2005 Document Revised: 05/24/2014 Document Reviewed: 03/08/2011 Elsevier Interactive Patient Education [employer].

## 2015-04-19 ENCOUNTER — Observation Stay (HOSPITAL_COMMUNITY)
Admission: EM | Admit: 2015-04-19 | Discharge: 2015-04-21 | Disposition: A | Discharge status: Home / Self Care | Payer: Medicare Other | Attending: Internal Medicine | Admitting: Internal Medicine

## 2015-04-19 ENCOUNTER — Emergency Department (HOSPITAL_COMMUNITY): Payer: Medicare Other

## 2015-04-19 ENCOUNTER — Encounter (HOSPITAL_COMMUNITY): Payer: Self-pay | Admitting: Emergency Medicine

## 2015-04-19 DIAGNOSIS — I10 Essential (primary) hypertension: Secondary | ICD-10-CM | POA: Insufficient documentation

## 2015-04-19 DIAGNOSIS — R404 Transient alteration of awareness: Secondary | ICD-10-CM | POA: Diagnosis not present

## 2015-04-19 DIAGNOSIS — E785 Hyperlipidemia, unspecified: Secondary | ICD-10-CM | POA: Insufficient documentation

## 2015-04-19 DIAGNOSIS — R55 Syncope and collapse: Principal | ICD-10-CM | POA: Insufficient documentation

## 2015-04-19 DIAGNOSIS — Z79899 Other long term (current) drug therapy: Secondary | ICD-10-CM | POA: Insufficient documentation

## 2015-04-19 LAB — COMPREHENSIVE METABOLIC PANEL
ALBUMIN: 4 g/dL (ref 3.5–5.0)
ALK PHOS: 75 U/L (ref 38–126)
ALT: 13 U/L — AB (ref 14–54)
ANION GAP: 10 (ref 5–15)
AST: 28 U/L (ref 15–41)
BUN: 21 mg/dL — ABNORMAL HIGH (ref 6–20)
CALCIUM: 9 mg/dL (ref 8.9–10.3)
CO2: 24 mmol/L (ref 22–32)
Chloride: 109 mmol/L (ref 101–111)
Creatinine, Ser: 1.19 mg/dL — ABNORMAL HIGH (ref 0.44–1.00)
GFR calc non Af Amer: 39 mL/min — ABNORMAL LOW (ref >60)
GFR, EST AFRICAN AMERICAN: 45 mL/min — AB (ref >60)
Glucose, Bld: 122 mg/dL — ABNORMAL HIGH (ref 65–99)
POTASSIUM: 3.9 mmol/L (ref 3.5–5.1)
SODIUM: 143 mmol/L (ref 135–145)
TOTAL PROTEIN: 7.5 g/dL (ref 6.5–8.1)
Total Bilirubin: 0.5 mg/dL (ref 0.3–1.2)

## 2015-04-19 LAB — CBC WITH DIFFERENTIAL/PLATELET
BASOS PCT: 0 %
Basophils Absolute: 0 10*3/uL (ref 0.0–0.1)
EOS ABS: 0.1 10*3/uL (ref 0.0–0.7)
EOS PCT: 2 %
HCT: 28.6 % — ABNORMAL LOW (ref 36.0–46.0)
HEMOGLOBIN: 8.6 g/dL — AB (ref 12.0–15.0)
LYMPHS ABS: 1 10*3/uL (ref 0.7–4.0)
Lymphocytes Relative: 18 %
MCH: 27.7 pg (ref 26.0–34.0)
MCHC: 30.1 g/dL (ref 30.0–36.0)
MCV: 92.3 fL (ref 78.0–100.0)
MONOS PCT: 9 %
Monocytes Absolute: 0.5 10*3/uL (ref 0.1–1.0)
NEUTROS PCT: 71 %
Neutro Abs: 4.1 10*3/uL (ref 1.7–7.7)
PLATELETS: 227 10*3/uL (ref 150–400)
RBC: 3.1 MIL/uL — AB (ref 3.87–5.11)
RDW: 13.9 % (ref 11.5–15.5)
WBC: 5.8 10*3/uL (ref 4.0–10.5)

## 2015-04-19 LAB — URINALYSIS, ROUTINE W REFLEX MICROSCOPIC
BILIRUBIN URINE: NEGATIVE
Glucose, UA: NEGATIVE mg/dL
HGB URINE DIPSTICK: NEGATIVE
Ketones, ur: 15 mg/dL — AB
Leukocytes, UA: NEGATIVE
NITRITE: NEGATIVE
Protein, ur: NEGATIVE mg/dL
SPECIFIC GRAVITY, URINE: 1.01 (ref 1.005–1.030)
pH: 8 (ref 5.0–8.0)

## 2015-04-19 LAB — LACTIC ACID, PLASMA: Lactic Acid, Venous: 1.1 mmol/L (ref 0.5–2.0)

## 2015-04-19 LAB — TROPONIN I

## 2015-04-19 MED ORDER — POLYMYXIN B-TRIMETHOPRIM 10000-0.1 UNIT/ML-% OP SOLN
1.0000 [drp] | OPHTHALMIC | Status: DC
  Administered 2015-04-20 – 2015-04-21 (×7): 1 [drp] via OPHTHALMIC
  Filled 2015-04-19: qty 10

## 2015-04-19 MED ORDER — ENOXAPARIN SODIUM 30 MG/0.3ML ~~LOC~~ SOLN
30.0000 mg | SUBCUTANEOUS | Status: DC
  Administered 2015-04-20 (×2): 30 mg via SUBCUTANEOUS
  Filled 2015-04-19 (×2): qty 0.3

## 2015-04-19 MED ORDER — ACETAMINOPHEN 325 MG PO TABS
650.0000 mg | ORAL_TABLET | Freq: Four times a day (QID) | ORAL | Status: DC | PRN
  Administered 2015-04-20: 650 mg via ORAL
  Filled 2015-04-19: qty 2

## 2015-04-19 MED ORDER — ACETAMINOPHEN 650 MG RE SUPP: 650.0000 mg | Freq: Four times a day (QID) | RECTAL | Status: DC | PRN

## 2015-04-19 MED ORDER — LORATADINE 10 MG PO TABS
10.0000 mg | ORAL_TABLET | Freq: Every day | ORAL | Status: DC
  Administered 2015-04-20 – 2015-04-21 (×2): 10 mg via ORAL
  Filled 2015-04-19 (×2): qty 1

## 2015-04-19 MED ORDER — SIMVASTATIN 20 MG PO TABS
40.0000 mg | ORAL_TABLET | Freq: Every day | ORAL | Status: DC
  Administered 2015-04-20 (×2): 40 mg via ORAL
  Filled 2015-04-19 (×2): qty 2

## 2015-04-19 MED ORDER — FELODIPINE ER 5 MG PO TB24
5.0000 mg | ORAL_TABLET | Freq: Every day | ORAL | Status: DC
Start: 2015-04-20 — End: 2015-04-21
  Administered 2015-04-20 – 2015-04-21 (×2): 5 mg via ORAL
  Filled 2015-04-19 (×5): qty 1

## 2015-04-19 MED ORDER — SODIUM CHLORIDE 0.9 % IV SOLN
INTRAVENOUS | Status: DC
  Administered 2015-04-19 – 2015-04-20 (×2): via INTRAVENOUS

## 2015-04-19 MED ORDER — DONEPEZIL HCL 5 MG PO TABS
5.0000 mg | ORAL_TABLET | Freq: Every day | ORAL | Status: DC
  Administered 2015-04-20 (×2): 5 mg via ORAL
  Filled 2015-04-19 (×2): qty 1

## 2015-04-19 MED ORDER — FAMOTIDINE 20 MG PO TABS
20.0000 mg | ORAL_TABLET | Freq: Every day | ORAL | Status: DC
  Administered 2015-04-20 – 2015-04-21 (×2): 20 mg via ORAL
  Filled 2015-04-19 (×2): qty 1

## 2015-04-19 NOTE — ED Notes (Addendum)
[place] son

## 2015-04-19 NOTE — ED Provider Notes (Signed)
CSN: [id number]     Arrival date & time 04/19/15  1803 History   First MD Initiated Contact with Patient 04/19/15 1814     Chief Complaint  Patient presents with  . Near Syncope      HPI Pt was seen at 1815. Per EMS, pt's family and pt: c/o sudden onset and resolution of one episode of syncope that occurred today PTA. Pt states she ws leaving her sister's house to drive home, and family found her sitting in her car unresponsive. Upon EMS arrival to scene, pt was awake/alert and able to ambulate to stretcher. Pt states she has "felt ok today." Pt was evaluated in the ED yesterday for syncopal episode and discharged. Denies palpitations/CP, no SOB/cough, no abd pain, no N/V/D, no fall, no neck or back pain, no focal motor weakness, no tingling/numbness in extremities.    Past Medical History  Diagnosis Date  . Hiatal hernia   . HTN (hypertension)   . Hyperlipidemia   . Diverticulosis   . Carcinoid tumor of stomach 2008  . Adult BMI 19-24 kg/sq m 2008 125 lbs  . Anemia     Pernicious 2008-HB 12 MCV 119.5; JAN 2012 HB 13.2 MCV 97.6  . GERD (gastroesophageal reflux disease)   . Pernicious anemia   . Wrist fracture, left 2008  . Pernicious anemia 06/14/2010  . Mild memory loss 02/15/2011   Past Surgical History  Procedure Laterality Date  . Colonoscopy  2003 LS    DC/[place] Diverticulosis  . Eus  2008 DJ    FNA NO CARCINOID  . Eus  JAN 2009    NL EXAM, NO Bx  . Upper gastrointestinal endoscopy  APR 2008 SLF    CARCIONOID  . Upper gastrointestinal endoscopy  FEB 2011 SLF    CARCINOID/mild anemia/large hiatal hernia  . Eus  [facility] 2011 WO    CARCINOID   Family History  Problem Relation Age of Onset  . Colon cancer Neg Hx   . Colon polyps Neg Hx    Social History  Substance Use Topics  . Smoking status: Never Smoker   . Smokeless tobacco: Never Used  . Alcohol Use: No    Review of Systems ROS: Statement: All systems negative except as marked or noted in the HPI; Constitutional:  Negative for fever and chills. ; ; Eyes: Negative for eye pain, redness and discharge. ; ; ENMT: Negative for ear pain, hoarseness, nasal congestion, sinus pressure and sore throat. ; ; Cardiovascular: Negative for chest pain, palpitations, diaphoresis, dyspnea and peripheral edema. ; ; Respiratory: Negative for cough, wheezing and stridor. ; ; Gastrointestinal: Negative for nausea, vomiting, diarrhea, abdominal pain, blood in stool, hematemesis, jaundice and rectal bleeding. . ; ; Genitourinary: Negative for dysuria, flank pain and hematuria. ; ; Musculoskeletal: Negative for back pain and neck pain. Negative for swelling and trauma.; ; Skin: Negative for pruritus, rash, abrasions, blisters, bruising and skin lesion.; ; Neuro: Negative for headache, lightheadedness and neck stiffness. Negative for weakness, extremity weakness, paresthesias, involuntary movement, seizure and +syncope.      Allergies  Codeine  Home Medications   Prior to Admission medications   Medication Sig Start Date End Date Taking? Authorizing Provider  acetaminophen (TYLENOL) 500 MG tablet Take 500 mg by mouth every 6 (six) hours as needed. For pain    Historical Provider, MD  ADVANCED FIBER COMPLEX PO Take by mouth 2 (two) times daily. Takes a gummy twice daily    Historical Provider, MD  calcium carbonate (  TUMS - DOSED IN MG ELEMENTAL CALCIUM) 500 MG chewable tablet Chew 2 tablets by mouth daily.    Historical Provider, MD  cetirizine (ZYRTEC) 10 MG tablet Take 10 mg by mouth daily.    Historical Provider, MD  Cyanocobalamin (VITAMIN B-12 IJ) Inject 1,000 mcg as directed every 30 (thirty) days.      Historical Provider, MD  donepezil (ARICEPT) 5 MG tablet Take 5 mg by mouth at bedtime.    Historical Provider, MD  famotidine (PEPCID) 20 MG tablet Take 20 mg by mouth daily.     Historical Provider, MD  felodipine (PLENDIL) 5 MG 24 hr tablet Take 5 mg by mouth daily.     Historical Provider, MD  simvastatin (ZOCOR) 40 MG  tablet Take 40 mg by mouth at bedtime.      Historical Provider, MD  trimethoprim-polymyxin b (POLYTRIM) ophthalmic solution Place 1 drop into the right eye every 4 (four) hours. 04/18/15   Merryl Hacker, MD   BP 152/77 mmHg  Pulse 92  Temp(Src) 98 F (36.7 C) (Oral)  Resp 12  Wt 113 lb (51.256 kg)  SpO2 100%   19:47 Orthostatic Vital Signs LB  Orthostatic Lying  - BP- Lying: 170/73 mmHg ; Pulse- Lying: 103  Orthostatic Sitting - BP- Sitting: 168/79 mmHg ; Pulse- Sitting: 103  Orthostatic Standing at 0 minutes - BP- Standing at 0 minutes: 182/76 mmHg ; Pulse- Standing at 0 minutes: 107     Patient Vitals for the past 24 hrs:  BP Temp Temp src Pulse Resp SpO2 Weight  04/19/15 1951 182/76 mmHg 98.2 F (36.8 C) Oral 107 24 100 % -  04/19/15 1809 152/77 mmHg 98 F (36.7 C) Oral 92 12 100 % 113 lb (51.256 kg)     Physical Exam  1820: Physical examination:  Nursing notes reviewed; Vital signs and O2 SAT reviewed;  Constitutional: Well developed, Well nourished, In no acute distress; Head:  Normocephalic, atraumatic; Eyes: EOMI, PERRL, No scleral icterus. +right conjunctiva injected.; ENMT: Mouth and pharynx normal, Mucous membranes dry; Neck: Supple, Full range of motion, No lymphadenopathy; Cardiovascular: Regular rate and rhythm, No gallop; Respiratory: Breath sounds clear & equal bilaterally, No wheezes.  Speaking full sentences with ease, Normal respiratory effort/excursion; Chest: Nontender, Movement normal; Abdomen: Soft, Nontender, Nondistended, Normal bowel sounds; Genitourinary: No CVA tenderness; Extremities: Pulses normal, No tenderness, No edema, No calf edema or asymmetry.; Neuro: AA&Ox3, vague historian. Major CN grossly intact. No facial droop. Speech clear. Grips equal. Strength 5/5 equal bilat UE's and LE's. No gross focal motor or sensory deficits in extremities.; Skin: Color normal, Warm, Dry.   ED Course  Procedures (including critical care time) Labs  Review  Imaging Review  I have personally reviewed and evaluated these images and lab results as part of my medical decision-making.   EKG Interpretation   Date/Time:  Wednesday April 19 2015 18:09:18 EDT Ventricular Rate:  91 PR Interval:  223 QRS Duration: 76 QT Interval:  367 QTC Calculation: 451 R Axis:   68 Text Interpretation:  Sinus rhythm Prolonged PR interval Right atrial  enlargement Probable anteroseptal infarct, old When compared with ECG of  04/18/2015 No significant change was found Confirmed by [facility]  MD,  Nunzio Cory [phone]) on 04/19/2015 6:40:53 PM      MDM  MDM Reviewed: previous chart, nursing note and vitals Reviewed previous: labs and ECG Interpretation: labs, ECG and x-ray   Results for orders placed or performed during the hospital encounter of 04/19/15  Comprehensive  metabolic panel  Result Value Ref Range   Sodium 143 135 - 145 mmol/L   Potassium 3.9 3.5 - 5.1 mmol/L   Chloride 109 101 - 111 mmol/L   CO2 24 22 - 32 mmol/L   Glucose, Bld 122 (H) 65 - 99 mg/dL   BUN 21 (H) 6 - 20 mg/dL   Creatinine, Ser 1.19 (H) 0.44 - 1.00 mg/dL   Calcium 9.0 8.9 - 10.3 mg/dL   Total Protein 7.5 6.5 - 8.1 g/dL   Albumin 4.0 3.5 - 5.0 g/dL   AST 28 15 - 41 U/L   ALT 13 (L) 14 - 54 U/L   Alkaline Phosphatase 75 38 - 126 U/L   Total Bilirubin 0.5 0.3 - 1.2 mg/dL   GFR calc non Af Amer 39 (L) >60 mL/min   GFR calc Af Amer 45 (L) >60 mL/min   Anion gap 10 5 - 15  Troponin I  Result Value Ref Range   Troponin I <0.03 <0.031 ng/mL  Lactic acid, plasma  Result Value Ref Range   Lactic Acid, Venous 1.1 0.5 - 2.0 mmol/L  CBC with Differential  Result Value Ref Range   WBC 5.8 4.0 - 10.5 K/uL   RBC 3.10 (L) 3.87 - 5.11 MIL/uL   Hemoglobin 8.6 (L) 12.0 - 15.0 g/dL   HCT 28.6 (L) 36.0 - 46.0 %   MCV 92.3 78.0 - 100.0 fL   MCH 27.7 26.0 - 34.0 pg   MCHC 30.1 30.0 - 36.0 g/dL   RDW 13.9 11.5 - 15.5 %   Platelets 227 150 - 400 K/uL   Neutrophils Relative % 71 %    Neutro Abs 4.1 1.7 - 7.7 K/uL   Lymphocytes Relative 18 %   Lymphs Abs 1.0 0.7 - 4.0 K/uL   Monocytes Relative 9 %   Monocytes Absolute 0.5 0.1 - 1.0 K/uL   Eosinophils Relative 2 %   Eosinophils Absolute 0.1 0.0 - 0.7 K/uL   Basophils Relative 0 %   Basophils Absolute 0.0 0.0 - 0.1 K/uL  Urinalysis, Routine w reflex microscopic  Result Value Ref Range   Color, Urine YELLOW YELLOW   APPearance CLEAR CLEAR   Specific Gravity, Urine 1.010 1.005 - 1.030   pH 8.0 5.0 - 8.0   Glucose, UA NEGATIVE NEGATIVE mg/dL   Hgb urine dipstick NEGATIVE NEGATIVE   Bilirubin Urine NEGATIVE NEGATIVE   Ketones, ur 15 (A) NEGATIVE mg/dL   Protein, ur NEGATIVE NEGATIVE mg/dL   Nitrite NEGATIVE NEGATIVE   Leukocytes, UA NEGATIVE NEGATIVE   Ct Head Wo Contrast 04/18/2015  CLINICAL DATA:  Status post syncope.  Initial encounter. EXAM: CT HEAD WITHOUT CONTRAST TECHNIQUE: Contiguous axial images were obtained from the base of the skull through the vertex without intravenous contrast. COMPARISON:  CT of the head performed 04/18/2014 FINDINGS: There is no evidence of acute infarction, mass lesion, or intra- or extra-axial hemorrhage on CT. Prominence of the ventricles and sulci reflects mild cortical volume loss. Mild cerebellar atrophy is noted. Scattered periventricular white matter change likely reflects small vessel ischemic microangiopathy. The brainstem and fourth ventricle are within normal limits. The basal ganglia are unremarkable in appearance. The cerebral hemispheres demonstrate grossly normal gray-white differentiation. No mass effect or midline shift is seen. There is no evidence of fracture; visualized osseous structures are unremarkable in appearance. The orbits are within normal limits. The paranasal sinuses and mastoid air cells are well-aerated. No significant soft tissue abnormalities are seen. IMPRESSION: 1. No acute intracranial  pathology seen on CT. 2. Mild cortical volume loss and scattered small  vessel ischemic microangiopathy. Electronically Signed   By: Garald Balding M.D.   On: 04/18/2015 01:58   Dg Chest Port 1 View 04/19/2015  CLINICAL DATA:  Syncopal episode. Altered mental status. History of hypertension, carcinoid tumor of stomach and hiatal hernia. EXAM: PORTABLE CHEST 1 VIEW COMPARISON:  04/18/2014 and 01/14/2012. FINDINGS: 1904 hours. Mild patient rotation to the left. The heart size and mediastinal contours are stable. There is a large hiatal hernia. The lungs appear clear. There is no pleural effusion or pneumothorax. No acute osseous findings are seen. The subacromial space of both shoulders is narrowed consistent with chronic rotator cuff tears. IMPRESSION: Stable chest without evidence of acute process. Large hiatal hernia. Electronically Signed   By: Richardean Sale M.D.   On: 04/19/2015 19:19     2015:  Stool for occult blood was negative during ED visit yesterday. This is pt's 2nd ED visit for syncope in 24 hours; will admit. T/C to [facility] Dr. Darrick Meigs, case discussed, including:  HPI, pertinent PM/SHx, VS/PE, dx testing, ED course and treatment:  Agreeable to admit, requests to write temporary orders, obtain observation tele bed to team APAdmtis.    Francine Graven, DO 04/21/15 [phone]

## 2015-04-19 NOTE — ED Notes (Addendum)
Per EMS: Pt was leaving sisters house 20 minutes ago and pt family found her outside in her car and appeared to be unresponsive. When EMS arrived, Pt was alert and oriented, ambulatory to stretcher, able to give ssn. Pt has hx of these "episodes" x1 year.  140/80, 82, 142 cbg

## 2015-04-19 NOTE — ED Notes (Signed)
Per pt and family they both report that pt has numerous "bugs" in her house and has not been able to get rid of them,

## 2015-04-19 NOTE — H&P (Signed)
PCP:   [name], MD   Chief Complaint:  Renella Cunas out  HPI: 82 yr old female who   has a past medical history of Hiatal hernia; HTN (hypertension); Hyperlipidemia; Diverticulosis; Carcinoid tumor of stomach (2008); Adult BMI 19-24 kg/sq m (2008 125 lbs); Anemia; GERD (gastroesophageal reflux disease); Pernicious anemia; Wrist fracture, left (2008); Pernicious anemia (06/14/2010); and Mild memory loss (02/15/2011). Today presents with hospital after had syncopal episode. Patient states she was leaving her sister's house to drive home, and family found her sitting in her car unresponsive. Patient was seen in the ED yesterday for similar complaint. At that time she was observed in the ED for 14 hours and discharged home. Patient denies any chest pain or shortness of breath. No nausea vomiting or diarrhea. No headache or blurred vision. Will Allergies:   Allergies  Allergen Reactions  . Codeine       Past Medical History  Diagnosis Date  . Hiatal hernia   . HTN (hypertension)   . Hyperlipidemia   . Diverticulosis   . Carcinoid tumor of stomach 2008  . Adult BMI 19-24 kg/sq m 2008 125 lbs  . Anemia     Pernicious 2008-HB 12 MCV 119.5; JAN 2012 HB 13.2 MCV 97.6  . GERD (gastroesophageal reflux disease)   . Pernicious anemia   . Wrist fracture, left 2008  . Pernicious anemia 06/14/2010  . Mild memory loss 02/15/2011    Past Surgical History  Procedure Laterality Date  . Colonoscopy  2003 LS    DC/[place] Diverticulosis  . Eus  2008 DJ    FNA NO CARCINOID  . Eus  JAN 2009    NL EXAM, NO Bx  . Upper gastrointestinal endoscopy  APR 2008 SLF    CARCIONOID  . Upper gastrointestinal endoscopy  FEB 2011 SLF    CARCINOID/mild anemia/large hiatal hernia  . Eus  [facility] 2011 WO    CARCINOID    Prior to Admission medications   Medication Sig Start Date End Date Taking? Authorizing Provider  acetaminophen (TYLENOL) 500 MG tablet Take 500 mg by mouth every 6 (six) hours as needed. For pain     Historical Provider, MD  ADVANCED FIBER COMPLEX PO Take by mouth 2 (two) times daily. Takes a gummy twice daily    Historical Provider, MD  calcium carbonate (TUMS - DOSED IN MG ELEMENTAL CALCIUM) 500 MG chewable tablet Chew 2 tablets by mouth daily.    Historical Provider, MD  cetirizine (ZYRTEC) 10 MG tablet Take 10 mg by mouth daily.    Historical Provider, MD  Cyanocobalamin (VITAMIN B-12 IJ) Inject 1,000 mcg as directed every 30 (thirty) days.      Historical Provider, MD  donepezil (ARICEPT) 5 MG tablet Take 5 mg by mouth at bedtime.    Historical Provider, MD  famotidine (PEPCID) 20 MG tablet Take 20 mg by mouth daily.     Historical Provider, MD  felodipine (PLENDIL) 5 MG 24 hr tablet Take 5 mg by mouth daily.     Historical Provider, MD  simvastatin (ZOCOR) 40 MG tablet Take 40 mg by mouth at bedtime.      Historical Provider, MD  trimethoprim-polymyxin b (POLYTRIM) ophthalmic solution Place 1 drop into the right eye every 4 (four) hours. 04/18/15   Merryl Hacker, MD    Social History:  reports that she has never smoked. She has never used smokeless tobacco. She reports that she does not drink alcohol or use illicit drugs.  Family  History  Problem Relation Age of Onset  . Colon cancer Neg Hx   . Colon polyps Neg Hx     Filed Weights   04/19/15 1809  Weight: 51.256 kg (113 lb)    All the positives are listed in BOLD  Review of Systems:  HEENT: Headache, blurred vision, runny nose, sore throat Neck: Hypothyroidism, hyperthyroidism,,lymphadenopathy Chest : Shortness of breath, history of COPD, Asthma Heart : Chest pain, history of coronary arterey disease GI:  Nausea, vomiting, diarrhea, constipation, GERD GU: Dysuria, urgency, frequency of urination, hematuria Neuro: Stroke, seizures, syncope Psych: Depression, anxiety, hallucinations   Physical Exam: Blood pressure 147/72, pulse 86, temperature 98.2 F (36.8 C), temperature source Oral, resp. rate 14, weight  51.256 kg (113 lb), SpO2 99 %. Constitutional:   Patient is a well-developed and well-nourished female  in no acute distress and cooperative with exam. Head: Normocephalic and atraumatic Mouth: Mucus membranes moist Eyes: PERRL, EOMI, conjunctivae normal Neck: Supple, No Thyromegaly Cardiovascular: RRR, S1 normal, S2 normal Pulmonary/Chest: CTAB, no wheezes, rales, or rhonchi Abdominal: Soft. Non-tender, non-distended, bowel sounds are normal, no masses, organomegaly, or guarding present.  Neurological: A&O x3, Strength is normal and symmetric bilaterally, cranial nerve II-XII are grossly intact, no focal motor deficit, sensory intact to light touch bilaterally.  Extremities : No Cyanosis, Clubbing or Edema  Labs on Admission:  Basic Metabolic Panel:  Recent Labs Lab 04/17/15 1817 04/19/15 1822  NA 140 143  K 4.1 3.9  CL 105 109  CO2 25 24  GLUCOSE 106* 122*  BUN 25* 21*  CREATININE 1.17* 1.19*  CALCIUM 9.0 9.0   Liver Function Tests:  Recent Labs Lab 04/17/15 1817 04/19/15 1822  AST 28 28  ALT 12* 13*  ALKPHOS 82 75  BILITOT 0.4 0.5  PROT 8.2* 7.5  ALBUMIN 4.3 4.0   CBC:  Recent Labs Lab 04/17/15 1817 04/19/15 1822  WBC 6.4 5.8  NEUTROABS 5.4 4.1  HGB 9.2* 8.6*  HCT 30.2* 28.6*  MCV 93.2 92.3  PLT 245 227   Cardiac Enzymes:  Recent Labs Lab 04/19/15 1822  TROPONINI <0.03     CBG:  Recent Labs Lab 04/17/15 1723  GLUCAP 102*    Radiological Exams on Admission: Ct Head Wo Contrast  04/18/2015  CLINICAL DATA:  Status post syncope.  Initial encounter. EXAM: CT HEAD WITHOUT CONTRAST TECHNIQUE: Contiguous axial images were obtained from the base of the skull through the vertex without intravenous contrast. COMPARISON:  CT of the head performed 04/18/2014 FINDINGS: There is no evidence of acute infarction, mass lesion, or intra- or extra-axial hemorrhage on CT. Prominence of the ventricles and sulci reflects mild cortical volume loss. Mild cerebellar  atrophy is noted. Scattered periventricular white matter change likely reflects small vessel ischemic microangiopathy. The brainstem and fourth ventricle are within normal limits. The basal ganglia are unremarkable in appearance. The cerebral hemispheres demonstrate grossly normal gray-white differentiation. No mass effect or midline shift is seen. There is no evidence of fracture; visualized osseous structures are unremarkable in appearance. The orbits are within normal limits. The paranasal sinuses and mastoid air cells are well-aerated. No significant soft tissue abnormalities are seen. IMPRESSION: 1. No acute intracranial pathology seen on CT. 2. Mild cortical volume loss and scattered small vessel ischemic microangiopathy. Electronically Signed   By: Garald Balding M.D.   On: 04/18/2015 01:58   Dg Chest Port 1 View  04/19/2015  CLINICAL DATA:  Syncopal episode. Altered mental status. History of hypertension, carcinoid tumor of stomach  and hiatal hernia. EXAM: PORTABLE CHEST 1 VIEW COMPARISON:  04/18/2014 and 01/14/2012. FINDINGS: 1904 hours. Mild patient rotation to the left. The heart size and mediastinal contours are stable. There is a large hiatal hernia. The lungs appear clear. There is no pleural effusion or pneumothorax. No acute osseous findings are seen. The subacromial space of both shoulders is narrowed consistent with chronic rotator cuff tears. IMPRESSION: Stable chest without evidence of acute process. Large hiatal hernia. Electronically Signed   By: Richardean Sale M.D.   On: 04/19/2015 19:19    EKG: Independently reviewed. Sinus rhythm   Assessment/Plan Active Problems:   Syncope   Syncope Admit the patient under telemetry Cycle cardiac enzymes Will check echocardiogram in a.m.  Dementia Stable, continue Aricept  DVT prophylaxis Lovenox  Code status: Full code  Family discussion:    Time Spent on Admission: 63 min  [facility] Pager:  [phone] 04/19/2015, 9:15 PM  If 7PM-7AM, please contact night-coverage  [email or web address]  Password [email or web address]

## 2015-04-20 ENCOUNTER — Encounter (HOSPITAL_COMMUNITY): Payer: Self-pay | Admitting: *Deleted

## 2015-04-20 ENCOUNTER — Observation Stay (HOSPITAL_BASED_OUTPATIENT_CLINIC_OR_DEPARTMENT_OTHER): Payer: Medicare Other

## 2015-04-20 DIAGNOSIS — R55 Syncope and collapse: Secondary | ICD-10-CM

## 2015-04-20 DIAGNOSIS — R4182 Altered mental status, unspecified: Secondary | ICD-10-CM | POA: Diagnosis not present

## 2015-04-20 DIAGNOSIS — R569 Unspecified convulsions: Secondary | ICD-10-CM | POA: Diagnosis not present

## 2015-04-20 LAB — COMPREHENSIVE METABOLIC PANEL
ALBUMIN: 3.5 g/dL (ref 3.5–5.0)
ALK PHOS: 69 U/L (ref 38–126)
ALT: 11 U/L — AB (ref 14–54)
AST: 22 U/L (ref 15–41)
Anion gap: 6 (ref 5–15)
BILIRUBIN TOTAL: 0.5 mg/dL (ref 0.3–1.2)
BUN: 18 mg/dL (ref 6–20)
CALCIUM: 8.3 mg/dL — AB (ref 8.9–10.3)
CO2: 27 mmol/L (ref 22–32)
CREATININE: 1.01 mg/dL — AB (ref 0.44–1.00)
Chloride: 107 mmol/L (ref 101–111)
GFR calc Af Amer: 55 mL/min — ABNORMAL LOW (ref >60)
GFR calc non Af Amer: 47 mL/min — ABNORMAL LOW (ref >60)
GLUCOSE: 112 mg/dL — AB (ref 65–99)
Potassium: 4.3 mmol/L (ref 3.5–5.1)
SODIUM: 140 mmol/L (ref 135–145)
TOTAL PROTEIN: 6.8 g/dL (ref 6.5–8.1)

## 2015-04-20 LAB — ECHOCARDIOGRAM COMPLETE
HEIGHTINCHES: 61 in
Weight: 1801.6 oz

## 2015-04-20 LAB — CBC
HEMATOCRIT: 27.2 % — AB (ref 36.0–46.0)
HEMOGLOBIN: 8.4 g/dL — AB (ref 12.0–15.0)
MCH: 28.4 pg (ref 26.0–34.0)
MCHC: 30.9 g/dL (ref 30.0–36.0)
MCV: 91.9 fL (ref 78.0–100.0)
Platelets: 219 10*3/uL (ref 150–400)
RBC: 2.96 MIL/uL — ABNORMAL LOW (ref 3.87–5.11)
RDW: 13.5 % (ref 11.5–15.5)
WBC: 5.1 10*3/uL (ref 4.0–10.5)

## 2015-04-20 LAB — TROPONIN I
Troponin I: <0.03 ng/mL (ref <0.031)
Troponin I: <0.03 ng/mL (ref <0.031)

## 2015-04-20 NOTE — Care Management Note (Signed)
Case Management Note  Patient Details  Name: [name] MRN: [id number] Date of Birth: 11/06/1922  Subjective/Objective:                  Pt admitted after syncopal episode at home. Pt lives alone, is ind with ADL's. Pt drives herself. Pt has PCP and no difficulty obtaining medications. Pt has never had [place] services in the past. Pt plans to return home with self care at DC.   Action/Plan: No CM needs anticipated.   Expected Discharge Date:    04/21/2015              Expected Discharge Plan:  Home/Self Care  In-House Referral:  NA  Discharge planning Services  CM Consult  Post Acute Care Choice:  NA Choice offered to:  NA  DME Arranged:    DME Agency:     HH Arranged:    HH Agency:     Status of Service:  In process, will continue to follow  Medicare Important Message Given:    Date Medicare IM Given:    Medicare IM give by:    Date Additional Medicare IM Given:    Additional Medicare Important Message give by:     If discussed at [place] of Stay Meetings, dates discussed:    Additional Comments:  Sherald Barge, RN 04/20/2015, 12:02 PM

## 2015-04-20 NOTE — Progress Notes (Addendum)
[facility] PROGRESS NOTE  [name] JXB:[id number] DOB: February 29, 1922 DOA: 04/19/2015 PCP: [name], MD  HPI/Brief narrative [phone] who presented with syncopal episode, after being found unresponsive while in car. On further questioning, patient reported feeling "dizzy" while sitting in car, asymptomatic prior to stopping. Patient denied postictal-type symptoms.  Assessment/Plan: Syncope Patient was admitted with telemetry Cardiac enzymes were neg x 3 2d echo was ordered, pending Orthostatics were negative Unclear etiology for syncope - since patient is having recurrent episodes, will ask Neurology for assstance  Dementia Stable, continued Aricept  DVT prophylaxis Lovenox while admitted  Code Status: Full Family Communication: Pt in room Disposition Plan: Possible d/c in 24hrs   Consultants:    Procedures:    Antibiotics: Anti-infectives    None      HPI/Subjective: Reports feeling well currently.  Objective: Filed Vitals:   04/19/15 2202 04/20/15 0500 04/20/15 1144 04/20/15 1259  BP: 169/81 141/55  127/47  Pulse: 89 90  93  Temp: 98.6 F (37 C) 98.4 F (36.9 C)  98.3 F (36.8 C)  TempSrc: Oral Oral  Oral  Resp: 16 18  18   Height: 5\' 1"  (1.549 m)     Weight: 51.075 kg (112 lb 9.6 oz)     SpO2: 100% 99% 98% 100%    Intake/Output Summary (Last 24 hours) at 04/20/15 1453 Last data filed at 04/20/15 1300  Gross per 24 hour  Intake    240 ml  Output      0 ml  Net    240 ml   Filed Weights   04/19/15 1809 04/19/15 2202  Weight: 51.256 kg (113 lb) 51.075 kg (112 lb 9.6 oz)    Exam:   General:  Awake, in nad  Cardiovascular: regular, s1, s2  Respiratory: normal resp effort, no wheezing  Abdomen: soft,nondistended  Musculoskeletal: perfused, no clubbing   Data Reviewed: Basic Metabolic Panel:  Recent Labs Lab 04/17/15 1817 04/19/15 1822 04/20/15 0100  NA 140 143 140  K 4.1 3.9 4.3  CL 105 109 107  CO2 25 24 27    GLUCOSE 106* 122* 112*  BUN 25* 21* 18  CREATININE 1.17* 1.19* 1.01*  CALCIUM 9.0 9.0 8.3*   Liver Function Tests:  Recent Labs Lab 04/17/15 1817 04/19/15 1822 04/20/15 0100  AST 28 28 22   ALT 12* 13* 11*  ALKPHOS 82 75 69  BILITOT 0.4 0.5 0.5  PROT 8.2* 7.5 6.8  ALBUMIN 4.3 4.0 3.5   No results for input(s): LIPASE, AMYLASE in the last 168 hours. No results for input(s): AMMONIA in the last 168 hours. CBC:  Recent Labs Lab 04/17/15 1817 04/19/15 1822 04/20/15 0100  WBC 6.4 5.8 5.1  NEUTROABS 5.4 4.1  --   HGB 9.2* 8.6* 8.4*  HCT 30.2* 28.6* 27.2*  MCV 93.2 92.3 91.9  PLT 245 227 219   Cardiac Enzymes:  Recent Labs Lab 04/19/15 1822 04/20/15 0100 04/20/15 0959  TROPONINI <0.03 <0.03 <0.03   BNP (last 3 results) No results for input(s): BNP in the last 8760 hours.  ProBNP (last 3 results) No results for input(s): PROBNP in the last 8760 hours.  CBG:  Recent Labs Lab 04/17/15 1723  GLUCAP 102*    No results found for this or any previous visit (from the past 240 hour(s)).   Studies: Dg Chest Port 1 View  04/19/2015  CLINICAL DATA:  Syncopal episode. Altered mental status. History of hypertension, carcinoid tumor of stomach and hiatal hernia. EXAM: PORTABLE CHEST 1  VIEW COMPARISON:  04/18/2014 and 01/14/2012. FINDINGS: 1904 hours. Mild patient rotation to the left. The heart size and mediastinal contours are stable. There is a large hiatal hernia. The lungs appear clear. There is no pleural effusion or pneumothorax. No acute osseous findings are seen. The subacromial space of both shoulders is narrowed consistent with chronic rotator cuff tears. IMPRESSION: Stable chest without evidence of acute process. Large hiatal hernia. Electronically Signed   By: Carey Bullocks M.D.   On: 04/19/2015 19:19    Scheduled Meds: . donepezil  5 mg Oral QHS  . enoxaparin (LOVENOX) injection  30 mg Subcutaneous Q24H  . famotidine  20 mg Oral Daily  . felodipine  5 mg  Oral Daily  . loratadine  10 mg Oral Daily  . simvastatin  40 mg Oral QHS  . trimethoprim-polymyxin b  1 drop Right Eye 6 times per day   Continuous Infusions: . sodium chloride 75 mL/hr at 04/20/15 1118    Active Problems:   Syncope    Jonatan Wilsey K  [facility] Pager [phone]. If 7PM-7AM, please contact night-coverage at [email or web address], password [facility] 04/20/2015, 2:53 PM

## 2015-04-20 NOTE — Progress Notes (Signed)
*  PRELIMINARY RESULTS* Echocardiogram 2D Echocardiogram has been performed.  [name] 04/20/2015, 3:48 PM

## 2015-04-20 NOTE — Care Management Obs Status (Signed)
[place] NOTIFICATION   Patient Details  Name: [name] MRN: [id number] Date of Birth: 1922/08/22   Medicare Observation Status Notification Given:  Yes    Sherald Barge, RN 04/20/2015, 12:02 PM

## 2015-04-21 ENCOUNTER — Other Ambulatory Visit (HOSPITAL_COMMUNITY): Payer: Medicare Other

## 2015-04-21 DIAGNOSIS — R4182 Altered mental status, unspecified: Secondary | ICD-10-CM | POA: Diagnosis not present

## 2015-04-21 DIAGNOSIS — R569 Unspecified convulsions: Secondary | ICD-10-CM | POA: Diagnosis not present

## 2015-04-21 DIAGNOSIS — R55 Syncope and collapse: Secondary | ICD-10-CM | POA: Diagnosis not present

## 2015-04-21 LAB — BASIC METABOLIC PANEL
ANION GAP: 9 (ref 5–15)
BUN: 15 mg/dL (ref 6–20)
CHLORIDE: 107 mmol/L (ref 101–111)
CO2: 22 mmol/L (ref 22–32)
Calcium: 8.2 mg/dL — ABNORMAL LOW (ref 8.9–10.3)
Creatinine, Ser: 0.94 mg/dL (ref 0.44–1.00)
GFR calc Af Amer: 60 mL/min — ABNORMAL LOW (ref >60)
GFR, EST NON AFRICAN AMERICAN: 51 mL/min — AB (ref >60)
GLUCOSE: 199 mg/dL — AB (ref 65–99)
POTASSIUM: 3.6 mmol/L (ref 3.5–5.1)
Sodium: 138 mmol/L (ref 135–145)

## 2015-04-21 LAB — URINE CULTURE: CULTURE: NO GROWTH

## 2015-04-21 LAB — CBC
HEMATOCRIT: 28.7 % — AB (ref 36.0–46.0)
HEMOGLOBIN: 8.6 g/dL — AB (ref 12.0–15.0)
MCH: 27.5 pg (ref 26.0–34.0)
MCHC: 30 g/dL (ref 30.0–36.0)
MCV: 91.7 fL (ref 78.0–100.0)
Platelets: 242 10*3/uL (ref 150–400)
RBC: 3.13 MIL/uL — AB (ref 3.87–5.11)
RDW: 13.8 % (ref 11.5–15.5)
WBC: 5 10*3/uL (ref 4.0–10.5)

## 2015-04-21 NOTE — Consult Note (Signed)
[place] A. Merlene Laughter, MD     [email or web address]          [name] is an 82 y.o. female.   ASSESSMENT/PLAN: 1. Recurrent episode of unresponsiveness and altered mentation of unclear etiology. The amnesia suggests a possibility of complex partial seizures. 2. Chronic anemia with some acute worsening. 3. Vitamin B12 deficiency.  RECOMMENDATION: EEG. No driving or operating machinery for now.  Patient is a 82 year old right-handed black female who presents to the hospital with 2 episodes of unresponsiveness. Patient seemed to be unresponsive and a has no recollection of the event. She does seem to complain of associated dizziness afterwards prior to the event. They are no reports of tonic activity. No bladder or bowel incontinence. No chest pain or shortness of breath. No headaches or focal numbness or weakness. Patient did have an event like this exactly a year ago. The workup in all 3 cases has been mostly unrevealing including the first event where she had a 30 day cardiac event monitor. The spells seem to last a few minutes but this last testing the lasted longer than the previous 2 events. She does appear to have some anemia but this is chronic. Patient is by herself and is highly functional. She typically does her ADLs and drives. She has a son checks in on her frequently however. The review of systems otherwise negative.  GENERAL: This is a very pleasant thin female in no acute distress.  HEENT: Supple. Atraumatic normocephalic. There is some reduced hearing.  ABDOMEN: soft  EXTREMITIES: No edema   BACK: Normal.  SKIN: Normal by inspection.    MENTAL STATUS: Alert and oriented. Speech, language and cognition are generally intact. Judgment and insight normal.   CRANIAL NERVES: Pupils are equal, round and reactive to light and accommodation; extra ocular movements are full, there is no significant nystagmus; visual fields are full; upper and lower facial  muscles are normal in strength and symmetric, there is no flattening of the nasolabial folds; tongue is midline; uvula is midline; shoulder elevation is normal.  MOTOR: Normal tone, bulk and strength; no pronator drift.  COORDINATION: Left finger to nose is normal, right finger to nose is normal, No rest tremor; no intention tremor; no postural tremor; no bradykinesia.  REFLEXES: Deep tendon reflexes are symmetrical and normal. Babinski reflexes are flexor bilaterally.   SENSATION: Normal to light touch.      Blood pressure 115/63, pulse 84, temperature 98.1 F (36.7 C), temperature source Oral, resp. rate 20, height 5' 1"  (1.549 m), weight 51.075 kg (112 lb 9.6 oz), SpO2 100 %.  Past Medical History  Diagnosis Date  . Hiatal hernia   . HTN (hypertension)   . Hyperlipidemia   . Diverticulosis   . Carcinoid tumor of stomach 2008  . Adult BMI 19-24 kg/sq m 2008 125 lbs  . Anemia     Pernicious 2008-HB 12 MCV 119.5; JAN 2012 HB 13.2 MCV 97.6  . GERD (gastroesophageal reflux disease)   . Pernicious anemia   . Wrist fracture, left 2008  . Pernicious anemia 06/14/2010  . Mild memory loss 02/15/2011    Past Surgical History  Procedure Laterality Date  . Colonoscopy  2003 LS    DC/[place] Diverticulosis  . Eus  2008 DJ    FNA NO CARCINOID  . Eus  JAN 2009    NL EXAM, NO Bx  . Upper gastrointestinal endoscopy  APR 2008 [facility]    CARCIONOID  . Upper gastrointestinal endoscopy  FEB 2011 [facility]    CARCINOID/mild anemia/large hiatal hernia  . Eus  [facility] 2011 WO    CARCINOID    Family History  Problem Relation Age of Onset  . Colon cancer Neg Hx   . Colon polyps Neg Hx     Social History:  reports that she has never smoked. She has never used smokeless tobacco. She reports that she does not drink alcohol or use illicit drugs.  Allergies:  Allergies  Allergen Reactions  . Codeine     Medications: Prior to Admission medications   Medication Sig Start Date End Date Taking?  Authorizing Provider  acetaminophen (TYLENOL) 500 MG tablet Take 500 mg by mouth every 6 (six) hours as needed. For pain   Yes Historical Provider, MD  ADVANCED FIBER COMPLEX PO Take 1 tablet by mouth 2 (two) times daily. Takes a gummy twice daily   Yes Historical Provider, MD  calcium carbonate (TUMS - DOSED IN MG ELEMENTAL CALCIUM) 500 MG chewable tablet Chew 2 tablets by mouth daily as needed for indigestion.    Yes Historical Provider, MD  cetirizine (ZYRTEC) 10 MG tablet Take 10 mg by mouth daily.   Yes Historical Provider, MD  Cyanocobalamin (VITAMIN B-12 IJ) Inject 1,000 mcg as directed every 30 (thirty) days.     Yes Historical Provider, MD  ENSURE PLUS (ENSURE PLUS) LIQD Take 237 mLs by mouth daily.   Yes Historical Provider, MD  famotidine (PEPCID) 20 MG tablet Take 20 mg by mouth daily.    Yes Historical Provider, MD  felodipine (PLENDIL) 5 MG 24 hr tablet Take 5 mg by mouth daily.    Yes Historical Provider, MD  Pneumococcal 13-Val Conj Vacc (PREVNAR 13 IM) Inject into the muscle once.   Yes Historical Provider, MD  simvastatin (ZOCOR) 40 MG tablet Take 40 mg by mouth at bedtime.     Yes Historical Provider, MD  trimethoprim-polymyxin b (POLYTRIM) ophthalmic solution Place 1 drop into the right eye every 4 (four) hours. 04/18/15  Yes Merryl Hacker, MD    Scheduled Meds: . donepezil  5 mg Oral QHS  . enoxaparin (LOVENOX) injection  30 mg Subcutaneous Q24H  . famotidine  20 mg Oral Daily  . felodipine  5 mg Oral Daily  . loratadine  10 mg Oral Daily  . simvastatin  40 mg Oral QHS  . trimethoprim-polymyxin b  1 drop Right Eye 6 times per day   Continuous Infusions:  PRN Meds:.acetaminophen **OR** acetaminophen     Results for orders placed or performed during the hospital encounter of 04/19/15 (from the past 48 hour(s))  Comprehensive metabolic panel     Status: Abnormal   Collection Time: 04/19/15  6:22 PM  Result Value Ref Range   Sodium 143 135 - 145 mmol/L    Potassium 3.9 3.5 - 5.1 mmol/L   Chloride 109 101 - 111 mmol/L   CO2 24 22 - 32 mmol/L   Glucose, Bld 122 (H) 65 - 99 mg/dL   BUN 21 (H) 6 - 20 mg/dL   Creatinine, Ser 1.19 (H) 0.44 - 1.00 mg/dL   Calcium 9.0 8.9 - 10.3 mg/dL   Total Protein 7.5 6.5 - 8.1 g/dL   Albumin 4.0 3.5 - 5.0 g/dL   AST 28 15 - 41 U/L   ALT 13 (L) 14 - 54 U/L   Alkaline Phosphatase 75 38 - 126 U/L   Total Bilirubin 0.5 0.3 - 1.2 mg/dL   GFR calc non Af Amer 39 (  L) >60 mL/min   GFR calc Af Amer 45 (L) >60 mL/min    Comment: (NOTE) The eGFR has been calculated using the CKD EPI equation. This calculation has not been validated in all clinical situations. eGFR's persistently <60 mL/min signify possible Chronic Kidney Disease.    Anion gap 10 5 - 15  Troponin I     Status: None   Collection Time: 04/19/15  6:22 PM  Result Value Ref Range   Troponin I <0.03 <0.031 ng/mL    Comment:        NO INDICATION OF MYOCARDIAL INJURY.   CBC with Differential     Status: Abnormal   Collection Time: 04/19/15  6:22 PM  Result Value Ref Range   WBC 5.8 4.0 - 10.5 K/uL   RBC 3.10 (L) 3.87 - 5.11 MIL/uL   Hemoglobin 8.6 (L) 12.0 - 15.0 g/dL   HCT 28.6 (L) 36.0 - 46.0 %   MCV 92.3 78.0 - 100.0 fL   MCH 27.7 26.0 - 34.0 pg   MCHC 30.1 30.0 - 36.0 g/dL   RDW 13.9 11.5 - 15.5 %   Platelets 227 150 - 400 K/uL   Neutrophils Relative % 71 %   Neutro Abs 4.1 1.7 - 7.7 K/uL   Lymphocytes Relative 18 %   Lymphs Abs 1.0 0.7 - 4.0 K/uL   Monocytes Relative 9 %   Monocytes Absolute 0.5 0.1 - 1.0 K/uL   Eosinophils Relative 2 %   Eosinophils Absolute 0.1 0.0 - 0.7 K/uL   Basophils Relative 0 %   Basophils Absolute 0.0 0.0 - 0.1 K/uL  Lactic acid, plasma     Status: None   Collection Time: 04/19/15  7:20 PM  Result Value Ref Range   Lactic Acid, Venous 1.1 0.5 - 2.0 mmol/L  Urinalysis, Routine w reflex microscopic     Status: Abnormal   Collection Time: 04/19/15  7:50 PM  Result Value Ref Range   Color, Urine YELLOW  YELLOW   APPearance CLEAR CLEAR   Specific Gravity, Urine 1.010 1.005 - 1.030   pH 8.0 5.0 - 8.0   Glucose, UA NEGATIVE NEGATIVE mg/dL   Hgb urine dipstick NEGATIVE NEGATIVE   Bilirubin Urine NEGATIVE NEGATIVE   Ketones, ur 15 (A) NEGATIVE mg/dL   Protein, ur NEGATIVE NEGATIVE mg/dL   Nitrite NEGATIVE NEGATIVE   Leukocytes, UA NEGATIVE NEGATIVE    Comment: MICROSCOPIC NOT DONE ON URINES WITH NEGATIVE PROTEIN, BLOOD, LEUKOCYTES, NITRITE, OR GLUCOSE <1000 mg/dL.  CBC     Status: Abnormal   Collection Time: 04/20/15  1:00 AM  Result Value Ref Range   WBC 5.1 4.0 - 10.5 K/uL   RBC 2.96 (L) 3.87 - 5.11 MIL/uL   Hemoglobin 8.4 (L) 12.0 - 15.0 g/dL   HCT 27.2 (L) 36.0 - 46.0 %   MCV 91.9 78.0 - 100.0 fL   MCH 28.4 26.0 - 34.0 pg   MCHC 30.9 30.0 - 36.0 g/dL   RDW 13.5 11.5 - 15.5 %   Platelets 219 150 - 400 K/uL  Comprehensive metabolic panel     Status: Abnormal   Collection Time: 04/20/15  1:00 AM  Result Value Ref Range   Sodium 140 135 - 145 mmol/L   Potassium 4.3 3.5 - 5.1 mmol/L   Chloride 107 101 - 111 mmol/L   CO2 27 22 - 32 mmol/L   Glucose, Bld 112 (H) 65 - 99 mg/dL   BUN 18 6 - 20 mg/dL   Creatinine, Ser  1.01 (H) 0.44 - 1.00 mg/dL   Calcium 8.3 (L) 8.9 - 10.3 mg/dL   Total Protein 6.8 6.5 - 8.1 g/dL   Albumin 3.5 3.5 - 5.0 g/dL   AST 22 15 - 41 U/L   ALT 11 (L) 14 - 54 U/L   Alkaline Phosphatase 69 38 - 126 U/L   Total Bilirubin 0.5 0.3 - 1.2 mg/dL   GFR calc non Af Amer 47 (L) >60 mL/min   GFR calc Af Amer 55 (L) >60 mL/min    Comment: (NOTE) The eGFR has been calculated using the CKD EPI equation. This calculation has not been validated in all clinical situations. eGFR's persistently <60 mL/min signify possible Chronic Kidney Disease.    Anion gap 6 5 - 15  Troponin I     Status: None   Collection Time: 04/20/15  1:00 AM  Result Value Ref Range   Troponin I <0.03 <0.031 ng/mL    Comment:        NO INDICATION OF MYOCARDIAL INJURY.   Troponin I      Status: None   Collection Time: 04/20/15  9:59 AM  Result Value Ref Range   Troponin I <0.03 <0.031 ng/mL    Comment:        NO INDICATION OF MYOCARDIAL INJURY.     Studies/Results:     [place] A. Merlene Laughter, M.D.  Diplomate, Tax adviser of Psychiatry and Neurology ( Neurology). 04/21/2015, 7:52 AM

## 2015-04-21 NOTE — Progress Notes (Signed)
Pt had no IV access, removed telemetry, tolerated well.  Reviewed discharge instructions with pt and family.

## 2015-04-21 NOTE — Discharge Instructions (Signed)
Please do not drive, operate heavy equipment, or handle small children until cleared by your PCP or Neurologist

## 2015-04-21 NOTE — Discharge Summary (Addendum)
Physician Discharge Summary  [name] [id number] DOB: 1922-02-19 DOA: 04/19/2015  PCP: [name], MD  Admit date: 04/19/2015 Discharge date: 04/21/2015  Time spent: 20 minutes  Recommendations for Outpatient Follow-up:  -Follow up with PCP in 2-3 weeks  -Patient instructed by multiple providers not to drive, operate heavy machinery, or handle small children until cleared by PCP -Follow up with Dr. [name] in 4 weeks  Discharge Diagnoses:  Active Problems:   Syncope   Discharge Condition: Stable  Diet recommendation: Heart healthy  Filed Weights   04/19/15 1809 04/19/15 2202  Weight: 51.256 kg (113 lb) 51.075 kg (112 lb 9.6 oz)    History of present illness:  Please review dictated H and P from 3/29 for details. Briefly, 82yo who presented with syncopal episode, after being found unresponsive while in car. On further questioning, patient reported feeling "dizzy" while sitting in car, asymptomatic prior to stopping. Patient denied postictal-type symptoms.  Hospital Course:  Syncope Patient was admitted with telemetry Cardiac enzymes were neg x 3 2d echo was ordered with mild-mod valvular calcifications noted, otherwise unremarkable with normal EF Orthostatics were negative Given recurrent episodes of syncope, had asked Neurology for assistance. Recs for EEG which will be done as outpatient. Dr. [name] to follow up with patient in 4 weeks  Dementia Remained stable, continued Aricept  DVT prophylaxis Lovenox while admitted  Procedures:  EEG  Consultations:  Neurology  Discharge Exam: Filed Vitals:   04/20/15 1259 04/20/15 2307 04/21/15 0630 04/21/15 1040  BP: 127/47 113/42 115/63   Pulse: 93 85 84   Temp: 98.3 F (36.8 C) 98.3 F (36.8 C) 98.1 F (36.7 C)   TempSrc: Oral Oral Oral   Resp: 18 20 20    Height:      Weight:      SpO2: 100% 100% 100% 100%    General: awake, in nad Cardiovascular: regular, s1, s2 Respiratory: normal resp effort,  no wheezing  Discharge Instructions     Medication List    TAKE these medications        acetaminophen 500 MG tablet  Commonly known as:  TYLENOL  Take 500 mg by mouth every 6 (six) hours as needed. For pain     ADVANCED FIBER COMPLEX PO  Take 1 tablet by mouth 2 (two) times daily. Takes a gummy twice daily     calcium carbonate 500 MG chewable tablet  Commonly known as:  TUMS - dosed in mg elemental calcium  Chew 2 tablets by mouth daily as needed for indigestion.     cetirizine 10 MG tablet  Commonly known as:  ZYRTEC  Take 10 mg by mouth daily.     ENSURE PLUS Liqd  Take 237 mLs by mouth daily.     famotidine 20 MG tablet  Commonly known as:  PEPCID  Take 20 mg by mouth daily.     felodipine 5 MG 24 hr tablet  Commonly known as:  PLENDIL  Take 5 mg by mouth daily.     PREVNAR 13 IM  Inject into the muscle once.     simvastatin 40 MG tablet  Commonly known as:  ZOCOR  Take 40 mg by mouth at bedtime.     trimethoprim-polymyxin b ophthalmic solution  Commonly known as:  POLYTRIM  Place 1 drop into the right eye every 4 (four) hours.     VITAMIN B-12 IJ  Inject 1,000 mcg as directed every 30 (thirty) days.       Allergies  Allergen Reactions  . Codeine        Follow-up Information    Follow up with [name], MD In 2 weeks.   Specialty:  Family Medicine   Why:  Hospital follow up   Contact information:   [place] [address] [phone]        The results of significant diagnostics from this hospitalization (including imaging, microbiology, ancillary and laboratory) are listed below for reference.    Significant Diagnostic Studies: Ct Head Wo Contrast  04/18/2015  CLINICAL DATA:  Status post syncope.  Initial encounter. EXAM: CT HEAD WITHOUT CONTRAST TECHNIQUE: Contiguous axial images were obtained from the base of the skull through the vertex without intravenous contrast. COMPARISON:  CT of the head performed  04/18/2014 FINDINGS: There is no evidence of acute infarction, mass lesion, or intra- or extra-axial hemorrhage on CT. Prominence of the ventricles and sulci reflects mild cortical volume loss. Mild cerebellar atrophy is noted. Scattered periventricular white matter change likely reflects small vessel ischemic microangiopathy. The brainstem and fourth ventricle are within normal limits. The basal ganglia are unremarkable in appearance. The cerebral hemispheres demonstrate grossly normal gray-white differentiation. No mass effect or midline shift is seen. There is no evidence of fracture; visualized osseous structures are unremarkable in appearance. The orbits are within normal limits. The paranasal sinuses and mastoid air cells are well-aerated. No significant soft tissue abnormalities are seen. IMPRESSION: 1. No acute intracranial pathology seen on CT. 2. Mild cortical volume loss and scattered small vessel ischemic microangiopathy. Electronically Signed   By: Garald Balding M.D.   On: 04/18/2015 01:58   Dg Chest Port 1 View  04/19/2015  CLINICAL DATA:  Syncopal episode. Altered mental status. History of hypertension, carcinoid tumor of stomach and hiatal hernia. EXAM: PORTABLE CHEST 1 VIEW COMPARISON:  04/18/2014 and 01/14/2012. FINDINGS: 1904 hours. Mild patient rotation to the left. The heart size and mediastinal contours are stable. There is a large hiatal hernia. The lungs appear clear. There is no pleural effusion or pneumothorax. No acute osseous findings are seen. The subacromial space of both shoulders is narrowed consistent with chronic rotator cuff tears. IMPRESSION: Stable chest without evidence of acute process. Large hiatal hernia. Electronically Signed   By: Richardean Sale M.D.   On: 04/19/2015 19:19    Microbiology: Recent Results (from the past 240 hour(s))  Urine culture     Status: None   Collection Time: 04/19/15  7:50 PM  Result Value Ref Range Status   Specimen Description URINE,  CATHETERIZED  Final   Special Requests NONE  Final   Culture   Final    NO GROWTH 1 DAY Performed at [facility]    Report Status 04/21/2015 FINAL  Final     Labs: Basic Metabolic Panel:  Recent Labs Lab 04/17/15 1817 04/19/15 1822 04/20/15 0100  NA 140 143 140  K 4.1 3.9 4.3  CL 105 109 107  CO2 25 24 27   GLUCOSE 106* 122* 112*  BUN 25* 21* 18  CREATININE 1.17* 1.19* 1.01*  CALCIUM 9.0 9.0 8.3*   Liver Function Tests:  Recent Labs Lab 04/17/15 1817 04/19/15 1822 04/20/15 0100  AST 28 28 22   ALT 12* 13* 11*  ALKPHOS 82 75 69  BILITOT 0.4 0.5 0.5  PROT 8.2* 7.5 6.8  ALBUMIN 4.3 4.0 3.5   No results for input(s): LIPASE, AMYLASE in the last 168 hours. No results for input(s): AMMONIA in the last 168  hours. CBC:  Recent Labs Lab 04/17/15 1817 04/19/15 1822 04/20/15 0100  WBC 6.4 5.8 5.1  NEUTROABS 5.4 4.1  --   HGB 9.2* 8.6* 8.4*  HCT 30.2* 28.6* 27.2*  MCV 93.2 92.3 91.9  PLT 245 227 219   Cardiac Enzymes:  Recent Labs Lab 04/19/15 1822 04/20/15 0100 04/20/15 0959  TROPONINI <0.03 <0.03 <0.03   BNP: BNP (last 3 results) No results for input(s): BNP in the last 8760 hours.  ProBNP (last 3 results) No results for input(s): PROBNP in the last 8760 hours.  CBG:  Recent Labs Lab 04/17/15 1723  GLUCAP 102*   Signed:  CHIU, STEPHEN K  [facility] 04/21/2015, 11:44 AM

## 2015-04-21 NOTE — Care Management Note (Signed)
Case Management Note  Patient Details  Name: [name] MRN: [id number] Date of Birth: [birth year]  Subjective/Objective:      Spoke with patient and he is alert and answers appropriately.  Family present in room. Patient will discharge home with no needs.               Action/Plan: Home with self care.   Expected Discharge Date:                  Expected Discharge Plan:  Home/Self Care  In-House Referral:  NA  Discharge planning Services  CM Consult  Post Acute Care Choice:  NA Choice offered to:  NA  DME Arranged:    DME Agency:     HH Arranged:    HH Agency:     Status of Service:  In process, will continue to follow  Medicare Important Message Given:    Date Medicare IM Given:    Medicare IM give by:    Date Additional Medicare IM Given:    Additional Medicare Important Message give by:     If discussed at [place] of Stay Meetings, dates discussed:    Additional Comments:  Alvie Heidelberg, RN 04/21/2015, 1:20 PM

## 2015-04-22 NOTE — Progress Notes (Signed)
Patient ID: [name], female   DOB: 08-30-1922, 82 y.o.   MRN: [id number]  [facility] NEUROLOGY Jessilyn Catino A. Gerilyn Pilgrim, MD     [email or web address]          [name] is an 82 y.o. female.   Assessment/Plan: ASSESSMENT/PLAN: 1. Recurrent episode of unresponsiveness and altered mentation of unclear etiology. The amnesia suggests a possibility of complex partial seizures. 2. Chronic anemia with some acute worsening. 3. Vitamin B12 deficiency.  RECOMMENDATION: EEG. May as ouputpt fu in 4 weeks No driving or operating machinery for now.  Doing well. No spells in hospital.   GENERAL: This is a very pleasant thin female in no acute distress.  HEENT: Supple. Atraumatic normocephalic. There is some reduced hearing.  ABDOMEN: soft  EXTREMITIES: No edema   BACK: Normal.  SKIN: Normal by inspection.   MENTAL STATUS: Alert and oriented. Speech, language and cognition are generally intact. Judgment and insight normal.   CRANIAL NERVES: Pupils are equal, round and reactive to light and accommodation; extra ocular movements are full, there is no significant nystagmus; visual fields are full; upper and lower facial muscles are normal in strength and symmetric, there is no flattening of the nasolabial folds; tongue is midline; uvula is midline; shoulder elevation is normal.  MOTOR: Normal tone, bulk and strength; no pronator drift.    Objective: Vital signs in last 24 hours: Temp:  [98.4 F (36.9 C)] 98.4 F (36.9 C) (03/31 1707) Pulse Rate:  [82] 82 (03/31 1707) Resp:  [20] 20 (03/31 1707) BP: (118)/(62) 118/62 mmHg (03/31 1707) SpO2:  [100 %] 100 % (03/31 1707)  Intake/Output from previous day:   Intake/Output this shift: Total I/O In: 480 [P.O.:480] Out: 400 [Urine:400] Nutritional status:     Lab Results: Results for orders placed or performed during the hospital encounter of 04/19/15 (from the past 48 hour(s))  Troponin I     Status: None   Collection Time:  04/20/15  9:59 AM  Result Value Ref Range   Troponin I <0.03 <0.031 ng/mL    Comment:        NO INDICATION OF MYOCARDIAL INJURY.   CBC     Status: Abnormal   Collection Time: 04/21/15  1:34 PM  Result Value Ref Range   WBC 5.0 4.0 - 10.5 K/uL   RBC 3.13 (L) 3.87 - 5.11 MIL/uL   Hemoglobin 8.6 (L) 12.0 - 15.0 g/dL   HCT 24.4 (L) 01.0 - 27.2 %   MCV 91.7 78.0 - 100.0 fL   MCH 27.5 26.0 - 34.0 pg   MCHC 30.0 30.0 - 36.0 g/dL   RDW 53.6 64.4 - 03.4 %   Platelets 242 150 - 400 K/uL  Basic metabolic panel     Status: Abnormal   Collection Time: 04/21/15  1:35 PM  Result Value Ref Range   Sodium 138 135 - 145 mmol/L   Potassium 3.6 3.5 - 5.1 mmol/L   Chloride 107 101 - 111 mmol/L   CO2 22 22 - 32 mmol/L   Glucose, Bld 199 (H) 65 - 99 mg/dL   BUN 15 6 - 20 mg/dL   Creatinine, Ser 7.42 0.44 - 1.00 mg/dL   Calcium 8.2 (L) 8.9 - 10.3 mg/dL   GFR calc non Af Amer 51 (L) >60 mL/min   GFR calc Af Amer 60 (L) >60 mL/min    Comment: (NOTE) The eGFR has been calculated using the CKD EPI equation. This calculation has not been validated in all clinical  situations. eGFR's persistently <60 mL/min signify possible Chronic Kidney Disease.    Anion gap 9 5 - 15    Lipid Panel No results for input(s): CHOL, TRIG, HDL, CHOLHDL, VLDL, LDLCALC in the last 72 hours.  Studies/Results:   Medications:  Scheduled Meds: Continuous Infusions: PRN Meds:.       Eowyn Tabone A. Gerilyn Pilgrim, M.D.  Diplomate, Biomedical engineer of Psychiatry and Neurology ( Neurology).

## 2015-05-16 DIAGNOSIS — D513 Other dietary vitamin B12 deficiency anemia: Secondary | ICD-10-CM | POA: Diagnosis not present

## 2015-05-16 DIAGNOSIS — K589 Irritable bowel syndrome without diarrhea: Secondary | ICD-10-CM | POA: Diagnosis not present

## 2015-05-16 DIAGNOSIS — D510 Vitamin B12 deficiency anemia due to intrinsic factor deficiency: Secondary | ICD-10-CM | POA: Diagnosis not present

## 2015-05-16 DIAGNOSIS — R4182 Altered mental status, unspecified: Secondary | ICD-10-CM | POA: Diagnosis not present

## 2015-05-16 DIAGNOSIS — I10 Essential (primary) hypertension: Secondary | ICD-10-CM | POA: Diagnosis not present

## 2015-05-16 DIAGNOSIS — H01002 Unspecified blepharitis right lower eyelid: Secondary | ICD-10-CM | POA: Diagnosis not present

## 2015-06-02 DIAGNOSIS — R569 Unspecified convulsions: Secondary | ICD-10-CM | POA: Diagnosis not present

## 2015-06-13 DIAGNOSIS — H2513 Age-related nuclear cataract, bilateral: Secondary | ICD-10-CM | POA: Diagnosis not present

## 2015-06-13 DIAGNOSIS — H25013 Cortical age-related cataract, bilateral: Secondary | ICD-10-CM | POA: Diagnosis not present

## 2015-06-13 DIAGNOSIS — H02132 Senile ectropion of right lower eyelid: Secondary | ICD-10-CM | POA: Diagnosis not present

## 2015-06-14 DIAGNOSIS — R55 Syncope and collapse: Secondary | ICD-10-CM | POA: Diagnosis not present

## 2015-06-14 DIAGNOSIS — R569 Unspecified convulsions: Secondary | ICD-10-CM | POA: Diagnosis not present

## 2015-06-14 DIAGNOSIS — R4182 Altered mental status, unspecified: Secondary | ICD-10-CM | POA: Diagnosis not present

## 2015-07-17 DIAGNOSIS — H02112 Cicatricial ectropion of right lower eyelid: Secondary | ICD-10-CM | POA: Diagnosis not present

## 2015-07-17 DIAGNOSIS — H16211 Exposure keratoconjunctivitis, right eye: Secondary | ICD-10-CM | POA: Diagnosis not present

## 2015-07-17 DIAGNOSIS — H02532 Eyelid retraction right lower eyelid: Secondary | ICD-10-CM | POA: Diagnosis not present

## 2015-07-26 DIAGNOSIS — E785 Hyperlipidemia, unspecified: Secondary | ICD-10-CM | POA: Diagnosis not present

## 2015-07-26 DIAGNOSIS — I10 Essential (primary) hypertension: Secondary | ICD-10-CM | POA: Diagnosis not present

## 2015-07-26 DIAGNOSIS — Z681 Body mass index (BMI) 19 or less, adult: Secondary | ICD-10-CM | POA: Diagnosis not present

## 2015-07-26 DIAGNOSIS — K589 Irritable bowel syndrome without diarrhea: Secondary | ICD-10-CM | POA: Diagnosis not present

## 2015-08-09 ENCOUNTER — Ambulatory Visit (HOSPITAL_COMMUNITY): Payer: Medicare Other | Admitting: Hematology & Oncology

## 2015-08-09 ENCOUNTER — Other Ambulatory Visit (HOSPITAL_COMMUNITY): Payer: Medicare Other

## 2015-08-17 ENCOUNTER — Ambulatory Visit (HOSPITAL_COMMUNITY): Payer: Medicare Other | Admitting: Hematology & Oncology

## 2015-08-17 ENCOUNTER — Other Ambulatory Visit (HOSPITAL_COMMUNITY): Payer: Medicare Other

## 2015-08-19 NOTE — Progress Notes (Signed)
This encounter was created in error - please disregard.

## 2015-08-21 DIAGNOSIS — H02112 Cicatricial ectropion of right lower eyelid: Secondary | ICD-10-CM | POA: Diagnosis not present

## 2015-08-21 DIAGNOSIS — H02532 Eyelid retraction right lower eyelid: Secondary | ICD-10-CM | POA: Diagnosis not present

## 2015-08-21 DIAGNOSIS — H02132 Senile ectropion of right lower eyelid: Secondary | ICD-10-CM | POA: Diagnosis not present

## 2015-09-20 DIAGNOSIS — E785 Hyperlipidemia, unspecified: Secondary | ICD-10-CM | POA: Diagnosis not present

## 2015-09-20 DIAGNOSIS — Z0000 Encounter for general adult medical examination without abnormal findings: Secondary | ICD-10-CM | POA: Diagnosis not present

## 2015-09-20 DIAGNOSIS — I10 Essential (primary) hypertension: Secondary | ICD-10-CM | POA: Diagnosis not present

## 2015-11-20 DIAGNOSIS — Z23 Encounter for immunization: Secondary | ICD-10-CM | POA: Diagnosis not present

## 2015-11-20 DIAGNOSIS — D510 Vitamin B12 deficiency anemia due to intrinsic factor deficiency: Secondary | ICD-10-CM | POA: Diagnosis not present

## 2015-11-20 DIAGNOSIS — I10 Essential (primary) hypertension: Secondary | ICD-10-CM | POA: Diagnosis not present

## 2016-01-19 DIAGNOSIS — D510 Vitamin B12 deficiency anemia due to intrinsic factor deficiency: Secondary | ICD-10-CM | POA: Diagnosis not present

## 2016-01-19 DIAGNOSIS — K5730 Diverticulosis of large intestine without perforation or abscess without bleeding: Secondary | ICD-10-CM | POA: Diagnosis not present

## 2016-01-19 DIAGNOSIS — I10 Essential (primary) hypertension: Secondary | ICD-10-CM | POA: Diagnosis not present

## 2016-03-11 NOTE — Progress Notes (Signed)
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 Donald Pore last visit 08/11/14)      INTERVAL HISTORY:  [name] is here for continued follow-up for her history of neuroendocrine carcinoid tumor of the stomach and pernicious anemia.    She has not been seen at our cancer center since 07/2014. From 04/19/15-04/21/15, she was hospitalized for syncope. Also with documented history of dementia. She denies any syncopal episodes since this hospitalization. She continues to stay with her son at night, but remains independent in her ADLs.   Her syncope work-up was negative (brain scans, cardiology eval, etc) per patient's son.  She is now able to drive, but she tries not to drive at night.   Her PCP told her she should resume her vitamin B12 injections; this was reportedly not based off of any recent lab studies. Per patient and her son, "He didn't know I had stopped getting my shots."  With regards to her history of neuroendocrine tumor, her appetite and energy levels are good.  "I'm a busy body and I like to stay busy."  Endorses occasional N&V, but doesn't happen very often; "it happens when I eat certain foods, like sausage."  Otherwise, she feels well.    REVIEW OF SYSTEMS:  Review of Systems  Constitutional: Negative.  Negative for chills, fatigue and fever.  HENT:  Negative.  Negative for lump/mass and nosebleeds.   Eyes: Negative.   Respiratory: Negative.  Negative for cough and shortness of breath.   Cardiovascular: Negative.  Negative for chest pain and leg swelling.  Gastrointestinal: Positive for nausea (intermittently,  but not often ) and vomiting (intermittently, but not often ). Negative for abdominal pain, blood in stool, constipation and diarrhea.  Endocrine: Negative.   Genitourinary: Negative.  Negative for dysuria and hematuria.   Musculoskeletal: Negative.  Negative for arthralgias.  Skin: Negative.  Negative for rash.  Neurological: Negative.  Negative for dizziness and headaches.  Hematological: Negative.  Negative for adenopathy. Does not bruise/bleed easily.  Psychiatric/Behavioral: Negative.  Negative for depression and sleep disturbance. The patient is not nervous/anxious.      PAST MEDICAL/SURGICAL HISTORY:  Past Medical History:  Diagnosis Date  . Adult BMI 19-24 kg/sq m 2008 125 lbs  . Anemia    Pernicious 2008-HB 12 MCV 119.5; JAN 2012 HB 13.2 MCV 97.6  . Carcinoid tumor of stomach 2008  . Diverticulosis   . GERD (gastroesophageal reflux disease)   . Hiatal hernia   . HTN (hypertension)   . Hyperlipidemia   . 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 Pernicious anemia   . Pernicious anemia 06/14/2010  . Wrist fracture, left 2008   Past Surgical History:  Procedure Laterality Date  . COLONOSCOPY  2003 LS   DC/[address] Diverticulosis  . EUS  2008 DJ   FNA NO CARCINOID  . EUS  JAN 2009   NL EXAM, NO Bx  . EUS  MAR 2011 WO   CARCINOID  . UPPER GASTROINTESTINAL ENDOSCOPY  APR 2008 SLF   CARCIONOID  . UPPER GASTROINTESTINAL ENDOSCOPY  FEB 2011 SLF   CARCINOID/mild anemia/large hiatal hernia  SOCIAL HISTORY:  Social History   Social History  . Marital status: Widowed    Spouse name: N/A  . Number of children: N/A  . Years of education: N/A   Occupational History  . Not on file.   Social History Main Topics  . Smoking status: Never Smoker  . Smokeless tobacco: Never Used  . Alcohol use No  . Drug use: No  . Sexual activity: Not Currently    Birth control/ protection: Post-menopausal   Other Topics Concern  . Not on file   Social History Narrative  . No narrative on  file    FAMILY HISTORY:  Family History  Problem Relation Age of Onset  . Colon cancer Neg Hx   . Colon polyps Neg Hx     CURRENT MEDICATIONS:  Outpatient Encounter Prescriptions as of 03/12/2016  Medication Sig Note  . acetaminophen (TYLENOL) 500 MG tablet Take 500 mg by mouth every 6 (six) hours as needed. For pain   . ADVANCED FIBER COMPLEX PO Take 1 tablet by mouth 2 (two) times daily. Takes a gummy twice daily   . calcium carbonate (TUMS - DOSED IN MG ELEMENTAL CALCIUM) 500 MG chewable tablet Chew 2 tablets by mouth daily as needed for indigestion.    . diphenhydrAMINE (BENADRYL) 25 mg capsule Take 25 mg by mouth every 6 (six) hours as needed.   . ENSURE PLUS (ENSURE PLUS) LIQD Take 237 mLs by mouth daily. 04/20/2015: Patient states she doesn't drink it everyday, just every so often.  . famotidine (PEPCID) 20 MG tablet Take 20 mg by mouth daily.    . felodipine (PLENDIL) 5 MG 24 hr tablet Take 5 mg by mouth daily.    . simvastatin (ZOCOR) 40 MG tablet Take 40 mg by mouth at bedtime.     Marland Kitchen trimethoprim-polymyxin b (POLYTRIM) ophthalmic solution Place 1 drop into the right eye every 4 (four) hours.   . Cyanocobalamin (VITAMIN B-12 IJ) Inject 1,000 mcg as directed every 30 (thirty) days.   04/18/2014: Patient states that she received the 1st of this month  . [DISCONTINUED] cetirizine (ZYRTEC) 10 MG tablet Take 10 mg by mouth daily.   . [DISCONTINUED] Pneumococcal 13-Val Conj Vacc (PREVNAR 13 IM) Inject into the muscle once.    No facility-administered encounter medications on file as of 03/12/2016.     ALLERGIES:  Allergies  Allergen Reactions  . Codeine      PHYSICAL EXAM:  ECOG Performance status: 1 - Symptomatic, but largely independent in ADLs; ambulatory without assistance.   Vitals:   03/12/16 1334  BP: (!) 145/67  Pulse: (!) 104  Resp: 16  Temp: 98 F (36.7 C)   Filed Weights   03/12/16 1334  Weight: 114 lb 6.4 oz (51.9 kg)    Physical Exam  Constitutional:  She is oriented to person, place, and time. No distress.  Thin, elderly female  HENT:  Head: Normocephalic.  Mouth/Throat: Oropharynx is clear and moist. No oropharyngeal exudate.  Eyes: Conjunctivae are normal. Pupils are equal, round, and reactive to light. No scleral icterus.  Neck: Normal range of motion. Neck supple.  Cardiovascular: Normal rate, regular rhythm and normal heart sounds.   Pulmonary/Chest: Effort normal and breath sounds normal. No respiratory distress. She has no wheezes. She has no rales.  Abdominal: Soft. Bowel sounds are normal. She exhibits no distension. There is no tenderness.  Musculoskeletal: Normal range of motion. She exhibits no edema.  Lymphadenopathy:    She has no cervical adenopathy.  Right: No supraclavicular adenopathy present.       Left: No supraclavicular adenopathy present.  Neurological: She is alert and oriented to person, place, and time. No cranial nerve deficit. Gait normal.  Skin: Skin is warm and dry. No rash noted.  Psychiatric: Mood, memory, affect and judgment normal.  Nursing note and vitals reviewed.    LABORATORY DATA:  I have reviewed the labs as listed.  CBC    Component Value Date/Time   WBC 5.8 03/12/2016 1414   RBC 3.84 (L) 03/12/2016 1414   HGB 11.2 (L) 03/12/2016 1414   HCT 36.0 03/12/2016 1414   PLT 201 03/12/2016 1414   MCV 93.8 03/12/2016 1414   MCH 29.2 03/12/2016 1414   MCHC 31.1 03/12/2016 1414   RDW 14.2 03/12/2016 1414   LYMPHSABS 1.6 03/12/2016 1414   MONOABS 0.5 03/12/2016 1414   EOSABS 0.0 03/12/2016 1414   BASOSABS 0.0 03/12/2016 1414   CMP Latest Ref Rng & Units 04/21/2015 04/20/2015 04/19/2015  Glucose 65 - 99 mg/dL 199(H) 112(H) 122(H)  BUN 6 - 20 mg/dL 15 18 21(H)  Creatinine 0.44 - 1.00 mg/dL 0.94 1.01(H) 1.19(H)  Sodium 135 - 145 mmol/L 138 140 143  Potassium 3.5 - 5.1 mmol/L 3.6 4.3 3.9  Chloride 101 - 111 mmol/L 107 107 109  CO2 22 - 32 mmol/L 22 27 24   Calcium 8.9 - 10.3 mg/dL 8.2(L)  8.3(L) 9.0  Total Protein 6.5 - 8.1 g/dL - 6.8 7.5  Total Bilirubin 0.3 - 1.2 mg/dL - 0.5 0.5  Alkaline Phos 38 - 126 U/L - 69 75  AST 15 - 41 U/L - 22 28  ALT 14 - 54 U/L - 11(L) 13(L)    PENDING LABS:    DIAGNOSTIC IMAGING:  CT head: 04/18/15   PATHOLOGY:  Stomach biopsy: 02/22/2009   ASSESSMENT & PLAN:   Low-grade neuroendocrine carcinoid tumor of stomach:  -Clinically, she is doing very well. We have been monitoring her disease with serotonin and chromogranin A lab values. Last serum serotonin normal at 97 on 08/11/14; no previous chromogranin A values. -We will notify her with the results.  -Return for continued surveillance in 6 months.   Anemia:  -History of pernicious anemia previously treated with monthly B12 injections; last injection 01/26/15. -I briefly discussed the pathophysiology of anemia, including different deficiencies that can contribute to overall anemia. Therefore, we will collect anemia panel today (includes B12, folate, iron/TIBC, & ferritin). -Will correct anemia based on established etiology after lab results made available.      Dispo:  -Labs today -Return to cancer center in 6 months for continued surveillance/follow-up.    All questions were answered to patient's stated satisfaction. Encouraged patient to call with any new concerns or questions before her next visit to the cancer center and we can certain see her sooner, if needed.      Orders placed this encounter:  Orders Placed This Encounter  Procedures  . Vitamin B12  . Folate  . Iron and TIBC  . Ferritin  . CBC with Differential/Platelet  . Comprehensive metabolic panel  . Chromogranin A  . Serotonin serum  . Vitamin B12  . Folate  . Iron and TIBC  . Ferritin      Mike Craze, NP [place] [phone]

## 2016-03-12 ENCOUNTER — Encounter (HOSPITAL_COMMUNITY): Payer: PPO | Attending: Adult Health | Admitting: Adult Health

## 2016-03-12 ENCOUNTER — Encounter (HOSPITAL_COMMUNITY): Payer: PPO

## 2016-03-12 ENCOUNTER — Encounter (HOSPITAL_COMMUNITY): Payer: Self-pay | Admitting: Adult Health

## 2016-03-12 VITALS — BP 145/67 | HR 104 | Temp 98.0°F | Resp 16 | Wt 114.4 lb

## 2016-03-12 DIAGNOSIS — I10 Essential (primary) hypertension: Secondary | ICD-10-CM | POA: Insufficient documentation

## 2016-03-12 DIAGNOSIS — F0390 Unspecified dementia without behavioral disturbance: Secondary | ICD-10-CM | POA: Insufficient documentation

## 2016-03-12 DIAGNOSIS — D3A092 Benign carcinoid tumor of the stomach: Secondary | ICD-10-CM | POA: Insufficient documentation

## 2016-03-12 DIAGNOSIS — Z9889 Other specified postprocedural states: Secondary | ICD-10-CM | POA: Insufficient documentation

## 2016-03-12 DIAGNOSIS — Z79899 Other long term (current) drug therapy: Secondary | ICD-10-CM | POA: Diagnosis not present

## 2016-03-12 DIAGNOSIS — R55 Syncope and collapse: Secondary | ICD-10-CM | POA: Insufficient documentation

## 2016-03-12 DIAGNOSIS — D649 Anemia, unspecified: Secondary | ICD-10-CM | POA: Diagnosis not present

## 2016-03-12 DIAGNOSIS — E785 Hyperlipidemia, unspecified: Secondary | ICD-10-CM | POA: Diagnosis not present

## 2016-03-12 DIAGNOSIS — Z8371 Family history of colonic polyps: Secondary | ICD-10-CM | POA: Diagnosis not present

## 2016-03-12 DIAGNOSIS — K219 Gastro-esophageal reflux disease without esophagitis: Secondary | ICD-10-CM | POA: Diagnosis not present

## 2016-03-12 DIAGNOSIS — Z888 Allergy status to other drugs, medicaments and biological substances status: Secondary | ICD-10-CM | POA: Diagnosis not present

## 2016-03-12 DIAGNOSIS — D510 Vitamin B12 deficiency anemia due to intrinsic factor deficiency: Secondary | ICD-10-CM | POA: Diagnosis not present

## 2016-03-12 LAB — CBC WITH DIFFERENTIAL/PLATELET
BASOS ABS: 0 10*3/uL (ref 0.0–0.1)
BASOS PCT: 0 %
Eosinophils Absolute: 0 10*3/uL (ref 0.0–0.7)
Eosinophils Relative: 0 %
HEMATOCRIT: 36 % (ref 36.0–46.0)
Hemoglobin: 11.2 g/dL — ABNORMAL LOW (ref 12.0–15.0)
LYMPHS PCT: 27 %
Lymphs Abs: 1.6 10*3/uL (ref 0.7–4.0)
MCH: 29.2 pg (ref 26.0–34.0)
MCHC: 31.1 g/dL (ref 30.0–36.0)
MCV: 93.8 fL (ref 78.0–100.0)
MONO ABS: 0.5 10*3/uL (ref 0.1–1.0)
Monocytes Relative: 8 %
NEUTROS ABS: 3.8 10*3/uL (ref 1.7–7.7)
Neutrophils Relative %: 65 %
Platelets: 201 10*3/uL (ref 150–400)
RBC: 3.84 MIL/uL — ABNORMAL LOW (ref 3.87–5.11)
RDW: 14.2 % (ref 11.5–15.5)
WBC: 5.8 10*3/uL (ref 4.0–10.5)

## 2016-03-12 LAB — COMPREHENSIVE METABOLIC PANEL
ALBUMIN: 4.2 g/dL (ref 3.5–5.0)
ALT: 12 U/L — AB (ref 14–54)
AST: 28 U/L (ref 15–41)
Alkaline Phosphatase: 79 U/L (ref 38–126)
Anion gap: 9 (ref 5–15)
BILIRUBIN TOTAL: 0.4 mg/dL (ref 0.3–1.2)
BUN: 18 mg/dL (ref 6–20)
CHLORIDE: 105 mmol/L (ref 101–111)
CO2: 26 mmol/L (ref 22–32)
CREATININE: 1.06 mg/dL — AB (ref 0.44–1.00)
Calcium: 9.6 mg/dL (ref 8.9–10.3)
GFR calc Af Amer: 51 mL/min — ABNORMAL LOW (ref >60)
GFR calc non Af Amer: 44 mL/min — ABNORMAL LOW (ref >60)
GLUCOSE: 95 mg/dL (ref 65–99)
POTASSIUM: 3.8 mmol/L (ref 3.5–5.1)
Sodium: 140 mmol/L (ref 135–145)
TOTAL PROTEIN: 8.3 g/dL — AB (ref 6.5–8.1)

## 2016-03-12 LAB — FOLATE: Folate: 60 ng/mL (ref >5.9)

## 2016-03-12 LAB — FERRITIN: Ferritin: 10 ng/mL — ABNORMAL LOW (ref 11–307)

## 2016-03-12 LAB — IRON AND TIBC
Iron: 25 ug/dL — ABNORMAL LOW (ref 28–170)
Saturation Ratios: 6 % — ABNORMAL LOW (ref 10.4–31.8)
TIBC: 434 ug/dL (ref 250–450)
UIBC: 409 ug/dL

## 2016-03-12 LAB — VITAMIN B12: Vitamin B-12: 461 pg/mL (ref 180–914)

## 2016-03-12 NOTE — Patient Instructions (Addendum)
[place] at [facility] Discharge Instructions  RECOMMENDATIONS MADE BY THE CONSULTANT AND ANY TEST RESULTS WILL BE SENT TO YOUR REFERRING PHYSICIAN.  Exam with Mike Craze, NP. Return to the clinic in 6 months for follow up. Please go to the labs prior to leaving today. Please see Amy as you leave for your appointments.    Thank you for choosing [place] at [facility] to provide your oncology and hematology care.  To afford each patient quality time with our provider, please arrive at least 15 minutes before your scheduled appointment time.    If you have a lab appointment with the [facility] please come in thru the  Main Entrance and check in at the main information desk  You need to re-schedule your appointment should you arrive 10 or more minutes late.  We strive to give you quality time with our providers, and arriving late affects you and other patients whose appointments are after yours.  Also, if you no show three or more times for appointments you may be dismissed from the clinic at the providers discretion.     Again, thank you for choosing [facility].  Our hope is that these requests will decrease the amount of time that you wait before being seen by our physicians.       _____________________________________________________________  Should you have questions after your visit to [facility], please contact our office at [phone] between the hours of 8:30 a.m. and 4:30 p.m.  Voicemails left after 4:30 p.m. will not be returned until the following business day.  For prescription refill requests, have your pharmacy contact our office.       Resources For Cancer Patients and their Caregivers ? American Cancer Society: Can assist with transportation, wigs, general needs, runs Look Good Feel Better.        (517)422-8674 ? Cancer Care: Provides financial assistance, online support groups,  medication/co-pay assistance.  1-800-813-HOPE 9184084316) ? [place] Assists [place] cancer patients and their families through emotional , educational and financial support.  229-034-5106 ? [place] DSS Where to apply for food stamps, Medicaid and utility assistance. 270-231-7144 ? RCATS: Transportation to medical appointments. (909) 501-0491 ? Social Security Administration: May apply for disability if have a Stage IV cancer. (719) 616-6123 9562925035 ? [employer], Disability and Transit Services: Assists with nutrition, care and transit needs. [place] Support Programs: @10RELATIVEDAYS @ > Cancer Support Group  2nd Tuesday of the month 1pm-2pm, Journey Room  > Creative Journey  3rd Tuesday of the month 1130am-1pm, Journey Room  > Look Good Feel Better  1st Wednesday of the month 10am-12 noon, Journey Room (Call [place] to register [phone])

## 2016-03-13 ENCOUNTER — Other Ambulatory Visit (HOSPITAL_COMMUNITY): Payer: Self-pay | Admitting: Adult Health

## 2016-03-14 LAB — CHROMOGRANIN A: Chromogranin A: 16 nmol/L — ABNORMAL HIGH (ref 0–5)

## 2016-03-14 LAB — SEROTONIN SERUM: Serotonin, Serum: 120 ng/mL (ref 0–420)

## 2016-03-22 ENCOUNTER — Encounter (HOSPITAL_COMMUNITY): Payer: PPO | Attending: Adult Health

## 2016-03-22 ENCOUNTER — Other Ambulatory Visit (HOSPITAL_COMMUNITY): Payer: Self-pay | Admitting: Adult Health

## 2016-03-22 VITALS — BP 142/65 | HR 81 | Temp 98.6°F | Resp 16

## 2016-03-22 DIAGNOSIS — D510 Vitamin B12 deficiency anemia due to intrinsic factor deficiency: Secondary | ICD-10-CM | POA: Diagnosis not present

## 2016-03-22 DIAGNOSIS — I10 Essential (primary) hypertension: Secondary | ICD-10-CM | POA: Insufficient documentation

## 2016-03-22 DIAGNOSIS — Z8371 Family history of colonic polyps: Secondary | ICD-10-CM | POA: Insufficient documentation

## 2016-03-22 DIAGNOSIS — K219 Gastro-esophageal reflux disease without esophagitis: Secondary | ICD-10-CM | POA: Insufficient documentation

## 2016-03-22 DIAGNOSIS — D649 Anemia, unspecified: Secondary | ICD-10-CM | POA: Insufficient documentation

## 2016-03-22 DIAGNOSIS — R55 Syncope and collapse: Secondary | ICD-10-CM | POA: Insufficient documentation

## 2016-03-22 DIAGNOSIS — Z79899 Other long term (current) drug therapy: Secondary | ICD-10-CM | POA: Insufficient documentation

## 2016-03-22 DIAGNOSIS — Z888 Allergy status to other drugs, medicaments and biological substances status: Secondary | ICD-10-CM | POA: Insufficient documentation

## 2016-03-22 DIAGNOSIS — D3A092 Benign carcinoid tumor of the stomach: Secondary | ICD-10-CM | POA: Insufficient documentation

## 2016-03-22 DIAGNOSIS — Z9889 Other specified postprocedural states: Secondary | ICD-10-CM | POA: Insufficient documentation

## 2016-03-22 DIAGNOSIS — F0390 Unspecified dementia without behavioral disturbance: Secondary | ICD-10-CM | POA: Insufficient documentation

## 2016-03-22 DIAGNOSIS — E785 Hyperlipidemia, unspecified: Secondary | ICD-10-CM | POA: Insufficient documentation

## 2016-03-22 MED ORDER — SODIUM CHLORIDE 0.9 % IV SOLN
Freq: Once | INTRAVENOUS | Status: AC
  Administered 2016-03-22: 14:00:00 via INTRAVENOUS

## 2016-03-22 MED ORDER — FERUMOXYTOL INJECTION 510 MG/17 ML
510.0000 mg | Freq: Once | INTRAVENOUS | Status: AC
  Administered 2016-03-22: 510 mg via INTRAVENOUS
  Filled 2016-03-22: qty 17

## 2016-03-22 MED ORDER — SODIUM CHLORIDE 0.9% FLUSH: 3.0000 mL | Freq: Once | INTRAVENOUS | Status: DC | PRN

## 2016-03-22 MED ORDER — CYANOCOBALAMIN 1000 MCG/ML IJ SOLN
1000.0000 ug | Freq: Once | INTRAMUSCULAR | Status: AC
  Administered 2016-03-22: 1000 ug via INTRAMUSCULAR
  Filled 2016-03-22: qty 1

## 2016-03-22 MED ORDER — SODIUM CHLORIDE 0.9% FLUSH: 10.0000 mL | INTRAVENOUS | Status: DC | PRN

## 2016-03-22 NOTE — Patient Instructions (Signed)
[place] at [facility] Discharge Instructions  RECOMMENDATIONS MADE BY THE CONSULTANT AND ANY TEST RESULTS WILL BE SENT TO YOUR REFERRING PHYSICIAN.  Feraheme 510 mg iron infusion given today as ordered. Vitamin B12 1000 mcg injection given as ordered. Return as scheduled.  Thank you for choosing [place] at [facility] to provide your oncology and hematology care.  To afford each patient quality time with our provider, please arrive at least 15 minutes before your scheduled appointment time.    If you have a lab appointment with the [place] please come in thru the  Main Entrance and check in at the main information desk  You need to re-schedule your appointment should you arrive 10 or more minutes late.  We strive to give you quality time with our providers, and arriving late affects you and other patients whose appointments are after yours.  Also, if you no show three or more times for appointments you may be dismissed from the clinic at the providers discretion.     Again, thank you for choosing [facility].  Our hope is that these requests will decrease the amount of time that you wait before being seen by our physicians.       _____________________________________________________________  Should you have questions after your visit to [facility], please contact our office at [phone] between the hours of 8:30 a.m. and 4:30 p.m.  Voicemails left after 4:30 p.m. will not be returned until the following business day.  For prescription refill requests, have your pharmacy contact our office.       Resources For Cancer Patients and their Caregivers ? American Cancer Society: Can assist with transportation, wigs, general needs, runs Look Good Feel Better.        (701)815-5125 ? Cancer Care: Provides financial assistance, online support groups, medication/co-pay assistance.  1-800-813-HOPE  323-788-1196) ? [place] Assists [place] cancer patients and their families through emotional , educational and financial support.  5091213973 ? [place] DSS Where to apply for food stamps, Medicaid and utility assistance. 762-782-1299 ? RCATS: Transportation to medical appointments. (909)040-8067 ? Social Security Administration: May apply for disability if have a Stage IV cancer. 571-415-1346 949-751-9600 ? [employer], Disability and Transit Services: Assists with nutrition, care and transit needs. [place] Support Programs: @10RELATIVEDAYS @ > Cancer Support Group  2nd Tuesday of the month 1pm-2pm, Journey Room  > Creative Journey  3rd Tuesday of the month 1130am-1pm, Journey Room  > Look Good Feel Better  1st Wednesday of the month 10am-12 noon, Journey Room (Call [place] to register [phone])

## 2016-03-22 NOTE — Progress Notes (Signed)
[name] presents today for injection per MD orders. B12 1000 mcg administered IM in left Upper Arm. Administration without incident. Patient tolerated well.  Tolerated iron infusion well. Stable and ambulatory on discharge home to self.

## 2016-04-23 DIAGNOSIS — K589 Irritable bowel syndrome without diarrhea: Secondary | ICD-10-CM | POA: Diagnosis not present

## 2016-04-23 DIAGNOSIS — I10 Essential (primary) hypertension: Secondary | ICD-10-CM | POA: Diagnosis not present

## 2016-04-23 DIAGNOSIS — E785 Hyperlipidemia, unspecified: Secondary | ICD-10-CM | POA: Diagnosis not present

## 2016-07-23 ENCOUNTER — Other Ambulatory Visit (HOSPITAL_COMMUNITY): Payer: Self-pay | Admitting: Oncology

## 2016-07-23 DIAGNOSIS — E785 Hyperlipidemia, unspecified: Secondary | ICD-10-CM | POA: Diagnosis not present

## 2016-07-23 DIAGNOSIS — I10 Essential (primary) hypertension: Secondary | ICD-10-CM | POA: Diagnosis not present

## 2016-07-29 ENCOUNTER — Encounter (HOSPITAL_COMMUNITY): Payer: Self-pay

## 2016-07-29 ENCOUNTER — Encounter (HOSPITAL_COMMUNITY): Payer: PPO | Attending: Oncology

## 2016-07-29 VITALS — BP 155/66 | HR 80 | Temp 97.8°F | Resp 18

## 2016-07-29 DIAGNOSIS — D510 Vitamin B12 deficiency anemia due to intrinsic factor deficiency: Secondary | ICD-10-CM

## 2016-07-29 MED ORDER — CYANOCOBALAMIN 1000 MCG/ML IJ SOLN
1000.0000 ug | Freq: Once | INTRAMUSCULAR | Status: AC
  Administered 2016-07-29: 1000 ug via INTRAMUSCULAR
  Filled 2016-07-29: qty 1

## 2016-07-29 NOTE — Progress Notes (Signed)
[name] presents today for injection per the provider's orders.  B12 administration without incident; see MAR for injection details.  Patient tolerated procedure well and without incident.  No questions or complaints noted at this time.  Discharged ambulatory.  

## 2016-08-27 ENCOUNTER — Encounter (HOSPITAL_BASED_OUTPATIENT_CLINIC_OR_DEPARTMENT_OTHER): Payer: PPO

## 2016-08-27 ENCOUNTER — Encounter (HOSPITAL_COMMUNITY): Payer: PPO | Attending: Oncology | Admitting: Oncology

## 2016-08-27 VITALS — BP 116/59 | HR 85 | Resp 16 | Ht 62.0 in | Wt 110.5 lb

## 2016-08-27 DIAGNOSIS — D3A092 Benign carcinoid tumor of the stomach: Secondary | ICD-10-CM | POA: Insufficient documentation

## 2016-08-27 DIAGNOSIS — D510 Vitamin B12 deficiency anemia due to intrinsic factor deficiency: Secondary | ICD-10-CM

## 2016-08-27 MED ORDER — CYANOCOBALAMIN 1000 MCG/ML IJ SOLN
1000.0000 ug | Freq: Once | INTRAMUSCULAR | Status: AC
  Administered 2016-08-27: 1000 ug via INTRAMUSCULAR
  Filled 2016-08-27: qty 1

## 2016-08-27 NOTE — Progress Notes (Signed)
[name] presents today for injection per the provider's orders.  B12 administration without incident; see MAR for injection details.  Patient tolerated procedure well and without incident.  No questions or complaints noted at this time.  Discharged ambulatory.  

## 2016-08-27 NOTE — Progress Notes (Signed)
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 Donald Pore last visit 08/11/14)      INTERVAL HISTORY:  [name] is here for continued follow-up for her history of neuroendocrine carcinoid tumor of the stomach and pernicious anemia.    Patient states that she has been doing well since her last visit. She has no complaints today. She states that she still stays active and is a "busy bee ". She denies any fatigue, chest pain, shortness breath, abdominal pain, nausea, vomiting, diarrhea, focal weakness.   REVIEW OF SYSTEMS:  Review of Systems  Constitutional: Negative.  Negative for chills, fatigue and fever.  HENT:  Negative.  Negative for lump/mass and nosebleeds.   Eyes: Negative.   Respiratory: Negative.  Negative for cough and shortness of breath.   Cardiovascular: Negative.  Negative for chest pain and leg swelling.  Gastrointestinal: Negative for abdominal pain, blood in stool, constipation, diarrhea, nausea and vomiting.  Endocrine: Negative.   Genitourinary: Negative.  Negative for dysuria and hematuria.   Musculoskeletal: Negative.  Negative for arthralgias.  Skin: Negative.  Negative for rash.  Neurological: Negative.  Negative for dizziness and headaches.  Hematological: Negative.  Negative for adenopathy. Does not bruise/bleed easily.  Psychiatric/Behavioral: Negative.  Negative for depression and sleep disturbance. The patient is not nervous/anxious.      PAST MEDICAL/SURGICAL HISTORY:  Past Medical History:  Diagnosis Date  . Adult BMI 19-24 kg/sq m 2008 125 lbs  . Anemia    Pernicious 2008-HB 12  MCV 119.5; JAN 2012 HB 13.2 MCV 97.6  . Carcinoid tumor of stomach 2008  . Diverticulosis   . GERD (gastroesophageal reflux disease)   . Hiatal hernia   . HTN (hypertension)   . Hyperlipidemia   . 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 Pernicious anemia   . Pernicious anemia 06/14/2010  . Wrist fracture, left 2008   Past Surgical History:  Procedure Laterality Date  . COLONOSCOPY  2003 LS   DC/[place] Diverticulosis  . EUS  2008 DJ   FNA NO CARCINOID  . EUS  JAN 2009   NL EXAM, NO Bx  . EUS  MAR 2011 WO   CARCINOID  . UPPER GASTROINTESTINAL ENDOSCOPY  APR 2008 [facility]   CARCIONOID  . UPPER GASTROINTESTINAL ENDOSCOPY  FEB 2011 [facility]   CARCINOID/mild anemia/large hiatal hernia     SOCIAL HISTORY:  Social History   Social History  . Marital status: Widowed    Spouse name: N/A  . Number of children: N/A  . Years of education: N/A   Occupational History  . Not on file.   Social History Main Topics  . Smoking status: Never Smoker  . Smokeless tobacco: Never Used  . Alcohol use No  . Drug use: No  . Sexual activity: Not Currently    Birth control/ protection: Post-menopausal   Other Topics Concern  . Not on file   Social History Narrative  . No narrative on file    FAMILY HISTORY:  Family History  Problem Relation Age of Onset  . Colon cancer Neg Hx   . Colon polyps Neg Hx  CURRENT MEDICATIONS:  Outpatient Encounter Prescriptions as of 08/27/2016  Medication Sig Note  . acetaminophen (TYLENOL) 500 MG tablet Take 500 mg by mouth every 6 (six) hours as needed. For pain   . ADVANCED FIBER COMPLEX PO Take 1 tablet by mouth 2 (two) times daily. Takes a gummy twice daily   . calcium carbonate (TUMS - DOSED IN MG ELEMENTAL CALCIUM) 500 MG chewable tablet Chew 2 tablets by mouth daily as needed for indigestion.    . Cyanocobalamin (VITAMIN B-12 IJ) Inject 1,000 mcg as directed every 30 (thirty) days.   04/18/2014: Patient states that she received the 1st of this month  .  diphenhydrAMINE (BENADRYL) 25 mg capsule Take 25 mg by mouth every 6 (six) hours as needed.   . ENSURE PLUS (ENSURE PLUS) LIQD Take 237 mLs by mouth daily. 04/20/2015: Patient states she doesn't drink it everyday, just every so often.  . famotidine (PEPCID) 20 MG tablet Take 20 mg by mouth daily.    . felodipine (PLENDIL) 5 MG 24 hr tablet Take 5 mg by mouth daily.    . simvastatin (ZOCOR) 40 MG tablet Take 40 mg by mouth at bedtime.     Marland Kitchen trimethoprim-polymyxin b (POLYTRIM) ophthalmic solution Place 1 drop into the right eye every 4 (four) hours.   . [EXPIRED] cyanocobalamin ((VITAMIN B-12)) injection 1,000 mcg     No facility-administered encounter medications on file as of 08/27/2016.     ALLERGIES:  Allergies  Allergen Reactions  . Codeine      PHYSICAL EXAM:  ECOG Performance status: 1 - Symptomatic, but largely independent in ADLs; ambulatory without assistance.   Vitals:   08/27/16 1029  BP: (!) 116/59  Pulse: 85  Resp: 16   Filed Weights   08/27/16 1029  Weight: 110 lb 8 oz (50.1 kg)    Physical Exam  Constitutional: She is oriented to person, place, and time. No distress.  Thin, elderly female  HENT:  Head: Normocephalic.  Mouth/Throat: Oropharynx is clear and moist. No oropharyngeal exudate.  Eyes: Pupils are equal, round, and reactive to light. Conjunctivae are normal. No scleral icterus.  Neck: Normal range of motion. Neck supple.  Cardiovascular: Normal rate, regular rhythm and normal heart sounds.   Pulmonary/Chest: Effort normal and breath sounds normal. No respiratory distress. She has no wheezes. She has no rales.  Abdominal: Soft. Bowel sounds are normal. She exhibits no distension. There is no tenderness.  Musculoskeletal: Normal range of motion. She exhibits no edema.  Lymphadenopathy:    She has no cervical adenopathy.       Right: No supraclavicular adenopathy present.       Left: No supraclavicular adenopathy present.  Neurological: She is alert and  oriented to person, place, and time. No cranial nerve deficit. Gait normal.  Skin: Skin is warm and dry. No rash noted.  Psychiatric: Mood, memory, affect and judgment normal.  Nursing note and vitals reviewed.    LABORATORY DATA:  I have reviewed the labs as listed.  CBC    Component Value Date/Time   WBC 5.8 03/12/2016 1414   RBC 3.84 (L) 03/12/2016 1414   HGB 11.2 (L) 03/12/2016 1414   HCT 36.0 03/12/2016 1414   PLT 201 03/12/2016 1414   MCV 93.8 03/12/2016 1414   MCH 29.2 03/12/2016 1414   MCHC 31.1 03/12/2016 1414   RDW 14.2 03/12/2016 1414   LYMPHSABS 1.6 03/12/2016 1414   MONOABS 0.5 03/12/2016 1414   EOSABS 0.0 03/12/2016 1414  BASOSABS 0.0 03/12/2016 1414   CMP Latest Ref Rng & Units 03/12/2016 04/21/2015 04/20/2015  Glucose 65 - 99 mg/dL 95 199(H) 112(H)  BUN 6 - 20 mg/dL 18 15 18   Creatinine 0.44 - 1.00 mg/dL 1.06(H) 0.94 1.01(H)  Sodium 135 - 145 mmol/L 140 138 140  Potassium 3.5 - 5.1 mmol/L 3.8 3.6 4.3  Chloride 101 - 111 mmol/L 105 107 107  CO2 22 - 32 mmol/L 26 22 27   Calcium 8.9 - 10.3 mg/dL 9.6 8.2(L) 8.3(L)  Total Protein 6.5 - 8.1 g/dL 8.3(H) - 6.8  Total Bilirubin 0.3 - 1.2 mg/dL 0.4 - 0.5  Alkaline Phos 38 - 126 U/L 79 - 69  AST 15 - 41 U/L 28 - 22  ALT 14 - 54 U/L 12(L) - 11(L)    PENDING LABS:    DIAGNOSTIC IMAGING:  CT head: 04/18/15   PATHOLOGY:  Stomach biopsy: 02/22/2009   ASSESSMENT & PLAN:   Low-grade neuroendocrine carcinoid tumor of stomach:  -Clinically, she is doing very well. We have been monitoring her disease with serotonin and chromogranin A lab values which have been stable. -Return for continued surveillance in 6 months.   Anemia:  -History of pernicious anemia previously treated with monthly B12 injections; last injection today.    Dispo:  -Return to cancer center in 6 months for continued surveillance/follow-up with labs.    All questions were answered to patient's stated satisfaction. Encouraged patient to call  with any new concerns or questions before her next visit to the cancer center and we can certain see her sooner, if needed.      Orders placed this encounter:  Orders Placed This Encounter  Procedures  . CBC with Differential  . Comprehensive metabolic panel  . Iron and TIBC  . Ferritin  . Vitamin B12  . Folate  . Chromogranin A  . Serotonin serum    Twana First, MD

## 2016-09-09 ENCOUNTER — Ambulatory Visit (HOSPITAL_COMMUNITY): Payer: PPO

## 2016-09-27 ENCOUNTER — Encounter (HOSPITAL_COMMUNITY): Payer: Self-pay

## 2016-09-27 ENCOUNTER — Encounter (HOSPITAL_COMMUNITY): Payer: PPO | Attending: Oncology

## 2016-09-27 VITALS — BP 129/60 | HR 75 | Temp 98.1°F | Resp 20

## 2016-09-27 DIAGNOSIS — D3A092 Benign carcinoid tumor of the stomach: Secondary | ICD-10-CM | POA: Insufficient documentation

## 2016-09-27 DIAGNOSIS — D510 Vitamin B12 deficiency anemia due to intrinsic factor deficiency: Secondary | ICD-10-CM | POA: Diagnosis not present

## 2016-09-27 MED ORDER — CYANOCOBALAMIN 1000 MCG/ML IJ SOLN
1000.0000 ug | Freq: Once | INTRAMUSCULAR | Status: AC
  Administered 2016-09-27: 1000 ug via INTRAMUSCULAR

## 2016-09-27 MED ORDER — CYANOCOBALAMIN 1000 MCG/ML IJ SOLN
INTRAMUSCULAR | Status: AC
  Filled 2016-09-27: qty 1

## 2016-09-27 NOTE — Patient Instructions (Signed)
[facility] at [facility] Discharge Instructions  RECOMMENDATIONS MADE BY THE CONSULTANT AND ANY TEST RESULTS WILL BE SENT TO YOUR REFERRING PHYSICIAN.  You received your B-12 injection today Follow up as scheduled.  Thank you for choosing [facility] at [facility] to provide your oncology and hematology care.  To afford each patient quality time with our provider, please arrive at least 15 minutes before your scheduled appointment time.    If you have a lab appointment with the Cancer Center please come in thru the  Main Entrance and check in at the main information desk  You need to re-schedule your appointment should you arrive 10 or more minutes late.  We strive to give you quality time with our providers, and arriving late affects you and other patients whose appointments are after yours.  Also, if you no show three or more times for appointments you may be dismissed from the clinic at the providers discretion.     Again, thank you for choosing [facility].  Our hope is that these requests will decrease the amount of time that you wait before being seen by our physicians.       _____________________________________________________________  Should you have questions after your visit to [facility], please contact our office at [phone] between the hours of 8:30 a.m. and 4:30 p.m.  Voicemails left after 4:30 p.m. will not be returned until the following business day.  For prescription refill requests, have your pharmacy contact our office.       Resources For Cancer Patients and their Caregivers ? American Cancer Society: Can assist with transportation, wigs, general needs, runs Look Good Feel Better.        1-888-227-6333 ? Cancer Care: Provides financial assistance, online support groups, medication/co-pay assistance.  1-800-813-HOPE (4673) ? [facility] Assists [place]  cancer patients and their families through emotional , educational and financial support.  336-427-4357 ? [place] DSS Where to apply for food stamps, Medicaid and utility assistance. 336-342-1394 ? RCATS: Transportation to medical appointments. 336-347-2287 ? Social Security Administration: May apply for disability if have a Stage IV cancer. 336-342-7796 1-800-772-1213 ? [place] Aging, Disability and Transit Services: Assists with nutrition, care and transit needs. 336-349-2343  Cancer Center Support Programs: @10RELATIVEDAYS@ > Cancer Support Group  2nd Tuesday of the month 1pm-2pm, Journey Room  > Creative Journey  3rd Tuesday of the month 1130am-1pm, Journey Room  > Look Good Feel Better  1st Wednesday of the month 10am-12 noon, Journey Room (Call American Cancer Society to register 1-800-395-5775)    

## 2016-09-27 NOTE — Progress Notes (Signed)
[name] presents today for injection per MD orders. B12 1000 mcg administered IM in left deltoid. Administration without incident. Patient tolerated treatment without incidence. Patient discharged ambulatory and in stable condition from clinic.  Patient to follow up as scheduled.

## 2016-10-21 DIAGNOSIS — I10 Essential (primary) hypertension: Secondary | ICD-10-CM | POA: Diagnosis not present

## 2016-10-21 DIAGNOSIS — R634 Abnormal weight loss: Secondary | ICD-10-CM | POA: Diagnosis not present

## 2016-10-21 DIAGNOSIS — K589 Irritable bowel syndrome without diarrhea: Secondary | ICD-10-CM | POA: Diagnosis not present

## 2016-10-21 DIAGNOSIS — Z23 Encounter for immunization: Secondary | ICD-10-CM | POA: Diagnosis not present

## 2016-10-28 ENCOUNTER — Encounter (HOSPITAL_COMMUNITY): Payer: Self-pay

## 2016-10-28 ENCOUNTER — Encounter (HOSPITAL_COMMUNITY): Payer: PPO | Attending: Oncology

## 2016-10-28 VITALS — BP 121/70 | HR 95 | Resp 18

## 2016-10-28 DIAGNOSIS — D510 Vitamin B12 deficiency anemia due to intrinsic factor deficiency: Secondary | ICD-10-CM

## 2016-10-28 MED ORDER — CYANOCOBALAMIN 1000 MCG/ML IJ SOLN
INTRAMUSCULAR | Status: AC
  Filled 2016-10-28: qty 1

## 2016-10-28 MED ORDER — CYANOCOBALAMIN 1000 MCG/ML IJ SOLN
1000.0000 ug | Freq: Once | INTRAMUSCULAR | Status: AC
  Administered 2016-10-28: 1000 ug via INTRAMUSCULAR

## 2016-10-28 NOTE — Progress Notes (Signed)
[name] presents today for injection per MD orders. B12 1,080mcg administered IM in left Upper Arm. Administration without incident. Patient tolerated well.   Treatment given per orders. Patient tolerated it well without problems. Vitals stable and discharged home from clinic ambulatory. Follow up as scheduled.

## 2016-10-28 NOTE — Patient Instructions (Signed)
[facility] at [facility] Discharge Instructions  RECOMMENDATIONS MADE BY THE CONSULTANT AND ANY TEST RESULTS WILL BE SENT TO YOUR REFERRING PHYSICIAN.  b12 injection done Follow up as scheduled.  Thank you for choosing [place] at [facility] to provide your oncology and hematology care.  To afford each patient quality time with our provider, please arrive at least 15 minutes before your scheduled appointment time.    If you have a lab appointment with the [facility] please come in thru the  Main Entrance and check in at the main information desk  You need to re-schedule your appointment should you arrive 10 or more minutes late.  We strive to give you quality time with our providers, and arriving late affects you and other patients whose appointments are after yours.  Also, if you no show three or more times for appointments you may be dismissed from the clinic at the providers discretion.     Again, thank you for choosing [facility].  Our hope is that these requests will decrease the amount of time that you wait before being seen by our physicians.       _____________________________________________________________  Should you have questions after your visit to [facility], please contact our office at [phone] between the hours of 8:30 a.m. and 4:30 p.m.  Voicemails left after 4:30 p.m. will not be returned until the following business day.  For prescription refill requests, have your pharmacy contact our office.       Resources For Cancer Patients and their Caregivers ? American Cancer Society: Can assist with transportation, wigs, general needs, runs Look Good Feel Better.        225-379-8831 ? Cancer Care: Provides financial assistance, online support groups, medication/co-pay assistance.  1-800-813-HOPE (405)460-8212) ? [facility] Assists [place] cancer patients and their  families through emotional , educational and financial support.  (832)499-8004 ? [place] DSS Where to apply for food stamps, Medicaid and utility assistance. 587 118 9341 ? RCATS: Transportation to medical appointments. 352 722 7131 ? Social Security Administration: May apply for disability if have a Stage IV cancer. (641) 087-1047 854-192-9659 ? [employer], Disability and Transit Services: Assists with nutrition, care and transit needs. [place] Support Programs: @10RELATIVEDAYS @ > Cancer Support Group  2nd Tuesday of the month 1pm-2pm, Journey Room  > Creative Journey  3rd Tuesday of the month 1130am-1pm, Journey Room  > Look Good Feel Better  1st Wednesday of the month 10am-12 noon, Journey Room (Call [place] to register [phone])

## 2016-11-04 DIAGNOSIS — Z0000 Encounter for general adult medical examination without abnormal findings: Secondary | ICD-10-CM | POA: Diagnosis not present

## 2016-11-28 ENCOUNTER — Encounter (HOSPITAL_COMMUNITY): Payer: PPO | Attending: Oncology

## 2016-11-28 ENCOUNTER — Encounter (HOSPITAL_COMMUNITY): Payer: Self-pay

## 2016-11-28 VITALS — BP 138/53 | HR 89 | Temp 98.0°F | Resp 20

## 2016-11-28 DIAGNOSIS — D510 Vitamin B12 deficiency anemia due to intrinsic factor deficiency: Secondary | ICD-10-CM | POA: Insufficient documentation

## 2016-11-28 DIAGNOSIS — D3A092 Benign carcinoid tumor of the stomach: Secondary | ICD-10-CM | POA: Insufficient documentation

## 2016-11-28 MED ORDER — CYANOCOBALAMIN 1000 MCG/ML IJ SOLN
INTRAMUSCULAR | Status: AC
  Filled 2016-11-28: qty 1

## 2016-11-28 MED ORDER — CYANOCOBALAMIN 1000 MCG/ML IJ SOLN
1000.0000 ug | Freq: Once | INTRAMUSCULAR | Status: AC
  Administered 2016-11-28: 1000 ug via INTRAMUSCULAR

## 2016-11-28 NOTE — Progress Notes (Signed)
Patient received a b12 shot today with no complaints voiced.  Injection site clean and dry with no bruising or swelling noted at site.  Band aid applied.  VSS with discahrge and left ambulatory.

## 2016-11-28 NOTE — Patient Instructions (Signed)
[facility] at [facility]  Discharge Instructions:  You received a b12 shot today.  _______________________________________________________________  Thank you for choosing [facility] at [facility] to provide your oncology and hematology care.  To afford each patient quality time with our providers, please arrive at least 15 minutes before your scheduled appointment.  You need to re-schedule your appointment if you arrive 10 or more minutes late.  We strive to give you quality time with our providers, and arriving late affects you and other patients whose appointments are after yours.  Also, if you no show three or more times for appointments you may be dismissed from the clinic.  Again, thank you for choosing [facility] at [facility]. Our hope is that these requests will allow you access to exceptional care and in a timely manner. _______________________________________________________________  If you have questions after your visit, please contact our office at [phone] between the hours of 8:30 a.m. and 5:00 p.m. Voicemails left after 4:30 p.m. will not be returned until the following business day. _______________________________________________________________  For prescription refill requests, have your pharmacy contact our office. _______________________________________________________________  Recommendations made by the consultant and any test results will be sent to your referring physician. _______________________________________________________________ 

## 2016-12-02 DIAGNOSIS — R634 Abnormal weight loss: Secondary | ICD-10-CM | POA: Diagnosis not present

## 2016-12-27 ENCOUNTER — Encounter (HOSPITAL_COMMUNITY): Payer: PPO | Attending: Oncology

## 2016-12-27 DIAGNOSIS — D3A092 Benign carcinoid tumor of the stomach: Secondary | ICD-10-CM | POA: Insufficient documentation

## 2016-12-27 DIAGNOSIS — D510 Vitamin B12 deficiency anemia due to intrinsic factor deficiency: Secondary | ICD-10-CM | POA: Insufficient documentation

## 2017-01-13 DIAGNOSIS — K589 Irritable bowel syndrome without diarrhea: Secondary | ICD-10-CM | POA: Diagnosis not present

## 2017-01-13 DIAGNOSIS — I10 Essential (primary) hypertension: Secondary | ICD-10-CM | POA: Diagnosis not present

## 2017-01-13 DIAGNOSIS — Z634 Disappearance and death of family member: Secondary | ICD-10-CM | POA: Diagnosis not present

## 2017-01-27 ENCOUNTER — Other Ambulatory Visit: Payer: Self-pay

## 2017-01-27 ENCOUNTER — Encounter (HOSPITAL_COMMUNITY): Payer: Self-pay

## 2017-01-27 ENCOUNTER — Inpatient Hospital Stay (HOSPITAL_COMMUNITY): Payer: PPO | Attending: Oncology

## 2017-01-27 VITALS — BP 144/64 | HR 88 | Temp 97.7°F | Resp 18

## 2017-01-27 DIAGNOSIS — D510 Vitamin B12 deficiency anemia due to intrinsic factor deficiency: Secondary | ICD-10-CM

## 2017-01-27 DIAGNOSIS — E538 Deficiency of other specified B group vitamins: Secondary | ICD-10-CM | POA: Diagnosis not present

## 2017-01-27 MED ORDER — CYANOCOBALAMIN 1000 MCG/ML IJ SOLN
1000.0000 ug | Freq: Once | INTRAMUSCULAR | Status: AC
  Administered 2017-01-27: 1000 ug via INTRAMUSCULAR

## 2017-01-27 MED ORDER — CYANOCOBALAMIN 1000 MCG/ML IJ SOLN
INTRAMUSCULAR | Status: AC
  Filled 2017-01-27: qty 1

## 2017-01-27 NOTE — Progress Notes (Signed)
[name] presents today for injection per the provider's orders.  B12 administration without incident; see MAR for injection details.  Patient tolerated procedure well and without incident.  No questions or complaints noted at this time.  Discharged ambulatory.  

## 2017-02-11 ENCOUNTER — Ambulatory Visit (HOSPITAL_COMMUNITY): Payer: PPO | Admitting: Adult Health

## 2017-02-11 ENCOUNTER — Other Ambulatory Visit (HOSPITAL_COMMUNITY): Payer: PPO

## 2017-02-11 ENCOUNTER — Ambulatory Visit (HOSPITAL_COMMUNITY): Payer: PPO

## 2017-02-27 ENCOUNTER — Inpatient Hospital Stay (HOSPITAL_COMMUNITY): Payer: PPO | Attending: Oncology

## 2017-02-27 ENCOUNTER — Inpatient Hospital Stay (HOSPITAL_COMMUNITY): Payer: PPO

## 2017-02-27 ENCOUNTER — Encounter (HOSPITAL_COMMUNITY): Payer: Self-pay | Admitting: Adult Health

## 2017-02-27 ENCOUNTER — Inpatient Hospital Stay (HOSPITAL_BASED_OUTPATIENT_CLINIC_OR_DEPARTMENT_OTHER): Payer: PPO | Admitting: Adult Health

## 2017-02-27 VITALS — BP 152/63 | HR 95 | Temp 97.9°F | Resp 18 | Wt 115.2 lb

## 2017-02-27 DIAGNOSIS — C7A8 Other malignant neuroendocrine tumors: Secondary | ICD-10-CM | POA: Diagnosis not present

## 2017-02-27 DIAGNOSIS — D3A092 Benign carcinoid tumor of the stomach: Secondary | ICD-10-CM

## 2017-02-27 DIAGNOSIS — I10 Essential (primary) hypertension: Secondary | ICD-10-CM | POA: Insufficient documentation

## 2017-02-27 DIAGNOSIS — D510 Vitamin B12 deficiency anemia due to intrinsic factor deficiency: Secondary | ICD-10-CM

## 2017-02-27 DIAGNOSIS — D509 Iron deficiency anemia, unspecified: Secondary | ICD-10-CM | POA: Insufficient documentation

## 2017-02-27 DIAGNOSIS — E785 Hyperlipidemia, unspecified: Secondary | ICD-10-CM | POA: Insufficient documentation

## 2017-02-27 DIAGNOSIS — C7A092 Malignant carcinoid tumor of the stomach: Secondary | ICD-10-CM | POA: Diagnosis not present

## 2017-02-27 LAB — IRON AND TIBC
IRON: 71 ug/dL (ref 28–170)
Saturation Ratios: 22 % (ref 10.4–31.8)
TIBC: 326 ug/dL (ref 250–450)
UIBC: 255 ug/dL

## 2017-02-27 LAB — COMPREHENSIVE METABOLIC PANEL
ALK PHOS: 97 U/L (ref 38–126)
ALT: 14 U/L (ref 14–54)
AST: 25 U/L (ref 15–41)
Albumin: 4 g/dL (ref 3.5–5.0)
Anion gap: 12 (ref 5–15)
BUN: 22 mg/dL — AB (ref 6–20)
CALCIUM: 9.5 mg/dL (ref 8.9–10.3)
CO2: 23 mmol/L (ref 22–32)
CREATININE: 1 mg/dL (ref 0.44–1.00)
Chloride: 105 mmol/L (ref 101–111)
GFR, EST AFRICAN AMERICAN: 55 mL/min — AB (ref >60)
GFR, EST NON AFRICAN AMERICAN: 47 mL/min — AB (ref >60)
Glucose, Bld: 105 mg/dL — ABNORMAL HIGH (ref 65–99)
Potassium: 3.8 mmol/L (ref 3.5–5.1)
Sodium: 140 mmol/L (ref 135–145)
Total Bilirubin: 0.8 mg/dL (ref 0.3–1.2)
Total Protein: 7.8 g/dL (ref 6.5–8.1)

## 2017-02-27 LAB — CBC WITH DIFFERENTIAL/PLATELET
BASOS ABS: 0 10*3/uL (ref 0.0–0.1)
Basophils Relative: 0 %
Eosinophils Absolute: 0.1 10*3/uL (ref 0.0–0.7)
Eosinophils Relative: 1 %
HCT: 40 % (ref 36.0–46.0)
HEMOGLOBIN: 12.1 g/dL (ref 12.0–15.0)
LYMPHS ABS: 1.1 10*3/uL (ref 0.7–4.0)
LYMPHS PCT: 20 %
MCH: 31.1 pg (ref 26.0–34.0)
MCHC: 30.3 g/dL (ref 30.0–36.0)
MCV: 102.8 fL — AB (ref 78.0–100.0)
Monocytes Absolute: 0.4 10*3/uL (ref 0.1–1.0)
Monocytes Relative: 7 %
NEUTROS ABS: 3.9 10*3/uL (ref 1.7–7.7)
NEUTROS PCT: 72 %
Platelets: 184 10*3/uL (ref 150–400)
RBC: 3.89 MIL/uL (ref 3.87–5.11)
RDW: 12.5 % (ref 11.5–15.5)
WBC: 5.5 10*3/uL (ref 4.0–10.5)

## 2017-02-27 LAB — FOLATE: FOLATE: 39 ng/mL (ref >5.9)

## 2017-02-27 LAB — VITAMIN B12: VITAMIN B 12: 560 pg/mL (ref 180–914)

## 2017-02-27 LAB — FERRITIN: Ferritin: 40 ng/mL (ref 11–307)

## 2017-02-27 NOTE — Progress Notes (Signed)
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 Donald Pore last visit 08/11/14)      INTERVAL HISTORY:  [name] is here for continued follow-up for her history of neuroendocrine carcinoid tumor of the stomach and pernicious anemia.    Here today unaccompanied.   Overall, she tells me she has been feeling good. Appetite and energy levels both 100%. She recently had a birthday and turned 84 years old.    Denies any fatigue, fever/chills, cough, shortness of breath, chest pain, leg swelling, abdominal pain, changes in bowel or bladder, N&V, dizziness, or or headaches.  Denies any flushing or diaphoresis.  Continues to receive monthly vitamin B12 injections at cancer center.  She does not recall coming to the cancer center once a month to receive these injections. "I haven't been coming up here once per month." I review her records with her and confirm that she has been receiving her injections monthly here.    Denies any frank bleeding episodes including blood in her stools, dark/tarry stools, hematuria, vaginal bleeding, hemoptysis, nosebleeds, or gingival bleeding. She received dose of IV iron in 03/2016; she tells me she tolerated the infusion well without complaints.   Her PCP is Dr. Berdine Addison in [place]; she sees him occasionally.   Overall, she feels well and is largely without complaints.     REVIEW OF SYSTEMS:  Review of Systems  Constitutional: Negative.  Negative for chills, fatigue and fever.  HENT:  Negative.  Negative for lump/mass and nosebleeds.   Eyes: Negative.   Respiratory: Negative.  Negative for  cough and shortness of breath.   Cardiovascular: Negative.  Negative for chest pain and leg swelling.  Gastrointestinal: Negative.  Negative for abdominal pain, blood in stool, constipation, diarrhea, nausea and vomiting.  Endocrine: Negative.   Genitourinary: Negative.  Negative for dysuria and hematuria.   Musculoskeletal: Negative.  Negative for arthralgias.  Skin: Negative.  Negative for rash.  Neurological: Negative.  Negative for dizziness and headaches.  Hematological: Negative.  Negative for adenopathy. Does not bruise/bleed easily.  Psychiatric/Behavioral: Negative.  Negative for depression and sleep disturbance. The patient is not nervous/anxious.      PAST MEDICAL/SURGICAL HISTORY:  Past Medical History:  Diagnosis Date  . Adult BMI 19-24 kg/sq m 2008 125 lbs  . Anemia    Pernicious 2008-HB 12 MCV 119.5; JAN 2012 HB 13.2 MCV 97.6  . Carcinoid tumor of stomach 2008  . Diverticulosis   . GERD (gastroesophageal reflux disease)   . Hiatal hernia   . HTN (hypertension)   . Hyperlipidemia   . 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 Pernicious anemia   . Pernicious anemia 06/14/2010  . Wrist fracture, left 2008   Past Surgical History:  Procedure Laterality Date  . COLONOSCOPY  2003 LS   DC/[place] Diverticulosis  . EUS  2008 DJ   FNA NO CARCINOID  . EUS  JAN 2009   NL EXAM, NO Bx  . EUS  MAR 2011 WO   CARCINOID  . UPPER GASTROINTESTINAL ENDOSCOPY  APR 2008 [facility]   CARCIONOID  . UPPER GASTROINTESTINAL ENDOSCOPY  FEB 2011 [facility]   CARCINOID/mild  anemia/large hiatal hernia     SOCIAL HISTORY:  Social History   Socioeconomic History  . Marital status: Widowed    Spouse name: Not on file  . Number of children: Not on file  . Years of education: Not on file  . Highest education level: Not on file  Social Needs  . Financial resource strain: Not on file  . Food insecurity - worry: Not on file  . Food insecurity - inability: Not on file  . Transportation needs - medical: Not on  file  . Transportation needs - non-medical: Not on file  Occupational History  . Not on file  Tobacco Use  . Smoking status: Never Smoker  . Smokeless tobacco: Never Used  Substance and Sexual Activity  . Alcohol use: No  . Drug use: No  . Sexual activity: Not Currently    Birth control/protection: Post-menopausal  Other Topics Concern  . Not on file  Social History Narrative  . Not on file    FAMILY HISTORY:  Family History  Problem Relation Age of Onset  . Colon cancer Neg Hx   . Colon polyps Neg Hx     CURRENT MEDICATIONS:  Outpatient Encounter Medications as of 02/27/2017  Medication Sig Note  . acetaminophen (TYLENOL) 500 MG tablet Take 500 mg by mouth every 6 (six) hours as needed. For pain   . ADVANCED FIBER COMPLEX PO Take 1 tablet by mouth 2 (two) times daily. Takes a gummy twice daily   . calcium carbonate (TUMS - DOSED IN MG ELEMENTAL CALCIUM) 500 MG chewable tablet Chew 2 tablets by mouth daily as needed for indigestion.    . Cyanocobalamin (VITAMIN B-12 IJ) Inject 1,000 mcg as directed every 30 (thirty) days.   04/18/2014: Patient states that she received the 1st of this month  . diphenhydrAMINE (BENADRYL) 25 mg capsule Take 25 mg by mouth every 6 (six) hours as needed.   . ENSURE PLUS (ENSURE PLUS) LIQD Take 237 mLs by mouth daily. 04/20/2015: Patient states she doesn't drink it everyday, just every so often.  . famotidine (PEPCID) 20 MG tablet Take 20 mg by mouth daily.    . felodipine (PLENDIL) 5 MG 24 hr tablet Take 5 mg by mouth daily.    . simvastatin (ZOCOR) 40 MG tablet Take 40 mg by mouth at bedtime.     Marland Kitchen trimethoprim-polymyxin b (POLYTRIM) ophthalmic solution Place 1 drop into the right eye every 4 (four) hours.    No facility-administered encounter medications on file as of 02/27/2017.     ALLERGIES:  Allergies  Allergen Reactions  . Codeine      PHYSICAL EXAM:  ECOG Performance status: 1 - Symptomatic, but largely independent in ADLs; ambulatory  without assistance.   Vitals:   02/27/17 1039  BP: (!) 152/63  Pulse: 95  Resp: 18  Temp: 97.9 F (36.6 C)  SpO2: 100%   Filed Weights   02/27/17 1039  Weight: 115 lb 3.2 oz (52.3 kg)    Physical Exam  Constitutional: She is oriented to person, place, and time and well-developed, well-nourished, and in no distress.  Thin, frail female in no acute distress  HENT:  Head: Normocephalic.  Mouth/Throat: Oropharynx is clear and moist. No oropharyngeal exudate.  Eyes: Conjunctivae are normal. Pupils are equal, round, and reactive to light. No scleral icterus.  Neck: Normal range of motion. Neck supple.  Cardiovascular: Normal rate and regular rhythm.  Pulmonary/Chest: Effort normal and breath sounds normal. No respiratory distress.  Abdominal:  Soft. Bowel sounds are normal. There is no tenderness.  Musculoskeletal: Normal range of motion. She exhibits no edema.  Lymphadenopathy:    She has no cervical adenopathy.       Right: No supraclavicular adenopathy present.       Left: No supraclavicular adenopathy present.  Neurological: She is alert and oriented to person, place, and time. No cranial nerve deficit. Gait normal.  Skin: Skin is warm and dry. No rash noted.  Psychiatric: Mood, affect and judgment normal.  Apparent memory loss re: her medical care/treatment plan   Nursing note and vitals reviewed.    LABORATORY DATA:  I have reviewed the labs as listed.  CBC    Component Value Date/Time   WBC 5.5 02/27/2017 0956   RBC 3.89 02/27/2017 0956   HGB 12.1 02/27/2017 0956   HCT 40.0 02/27/2017 0956   PLT 184 02/27/2017 0956   MCV 102.8 (H) 02/27/2017 0956   MCH 31.1 02/27/2017 0956   MCHC 30.3 02/27/2017 0956   RDW 12.5 02/27/2017 0956   LYMPHSABS 1.1 02/27/2017 0956   MONOABS 0.4 02/27/2017 0956   EOSABS 0.1 02/27/2017 0956   BASOSABS 0.0 02/27/2017 0956   CMP Latest Ref Rng & Units 02/27/2017 03/12/2016 04/21/2015  Glucose 65 - 99 mg/dL 105(H) 95 199(H)  BUN 6 - 20  mg/dL 22(H) 18 15  Creatinine 0.44 - 1.00 mg/dL 1.00 1.06(H) 0.94  Sodium 135 - 145 mmol/L 140 140 138  Potassium 3.5 - 5.1 mmol/L 3.8 3.8 3.6  Chloride 101 - 111 mmol/L 105 105 107  CO2 22 - 32 mmol/L 23 26 22   Calcium 8.9 - 10.3 mg/dL 9.5 9.6 8.2(L)  Total Protein 6.5 - 8.1 g/dL 7.8 8.3(H) -  Total Bilirubin 0.3 - 1.2 mg/dL 0.8 0.4 -  Alkaline Phos 38 - 126 U/L 97 79 -  AST 15 - 41 U/L 25 28 -  ALT 14 - 54 U/L 14 12(L) -    PENDING LABS:    DIAGNOSTIC IMAGING:  CT head: 04/18/15   PATHOLOGY:  Stomach biopsy: 02/22/2009      ASSESSMENT & PLAN:   Low-grade neuroendocrine carcinoid tumor of stomach:  -Clinically, she is doing very well. She has no clinical symptoms concerning for recurrent disease.  We have been monitoring her disease with serial serotonin and chromogranin A lab values. Chromogranin A levels have historically been elevated, but stable. Levels from today are pending; we will contact her with results.  -Return to cancer center in 6 months for follow-up with labs.    Pernicious anemia:  -Hgb normal at 12.1 g/dL today. Continue monthly vitamin B12 injections. We discussed the option of having her self-administer or have a family member give her the B12 shots at home, if this would be easier for her.  She tells me she will think about it, "but I don't have any trouble coming up here so far."  If she changes her mind and wants to administer her vitamin B12 inj at home, we can help accommodate this to save her trips to the cancer center.  -For now, continue monthly vitamin B12 injections at cancer center.   Iron deficiency anemia:  -Noted to have iron deficiency in late 02/2016 with ferritin 10 and iron sats 6%; total iron 25 at that time.   -Received dose of IV iron in 03/2016 and reportedly tolerated well.  Oncology Flowsheet 03/22/2016  ferumoxytol [facility]) IV 510 mg  -Iron studies and remainder of anemia panel pending. We will contact  her with results and make  arrangements for additional dose of IV iron, if appropriate.       Dispo:  -Continue monthly vitamin B12 injections.  -Return to cancer center in 6 months for follow-up with labs.    All questions were answered to patient's stated satisfaction. Encouraged patient to call with any new concerns or questions before her next visit to the cancer center and we can certain see her sooner, if needed.      Orders placed this encounter:  Orders Placed This Encounter  Procedures  . Vitamin B12  . Folate  . Iron and TIBC  . Ferritin  . CBC with Differential/Platelet  . Basic metabolic panel  . Chromogranin A  . Serotonin serum      Mike Craze, NP [place] [phone]

## 2017-02-27 NOTE — Patient Instructions (Signed)
[place] at [facility]  Discharge Instructions:  You will return in 6 months with labs and see the nurse practitioner.  _______________________________________________________________  Thank you for choosing [place] at [facility] to provide your oncology and hematology care.  To afford each patient quality time with our providers, please arrive at least 15 minutes before your scheduled appointment.  You need to re-schedule your appointment if you arrive 10 or more minutes late.  We strive to give you quality time with our providers, and arriving late affects you and other patients whose appointments are after yours.  Also, if you no show three or more times for appointments you may be dismissed from the clinic.  Again, thank you for choosing [place] at [place] hope is that these requests will allow you access to exceptional care and in a timely manner. _______________________________________________________________  If you have questions after your visit, please contact our office at [phone] between the hours of 8:30 a.m. and 5:00 p.m. Voicemails left after 4:30 p.m. will not be returned until the following business day. _______________________________________________________________  For prescription refill requests, have your pharmacy contact our office. _______________________________________________________________  Recommendations made by the consultant and any test results will be sent to your referring physician. _______________________________________________________________

## 2017-02-27 NOTE — Progress Notes (Signed)
No B12 injection given today, patient stated she already had her injection and she left. She actually did not receive it. Rescheduled patient per NP.

## 2017-02-28 LAB — CHROMOGRANIN A: Chromogranin A: 9 nmol/L — ABNORMAL HIGH (ref 0–5)

## 2017-03-01 LAB — SEROTONIN SERUM: SEROTONIN, SERUM: 106 ng/mL (ref 0–420)

## 2017-03-02 ENCOUNTER — Encounter (HOSPITAL_COMMUNITY): Payer: Self-pay | Admitting: Adult Health

## 2017-03-19 DIAGNOSIS — J22 Unspecified acute lower respiratory infection: Secondary | ICD-10-CM | POA: Diagnosis not present

## 2017-03-19 DIAGNOSIS — J111 Influenza due to unidentified influenza virus with other respiratory manifestations: Secondary | ICD-10-CM | POA: Diagnosis not present

## 2017-03-19 DIAGNOSIS — R509 Fever, unspecified: Secondary | ICD-10-CM | POA: Diagnosis not present

## 2017-03-31 ENCOUNTER — Inpatient Hospital Stay (HOSPITAL_COMMUNITY): Payer: PPO | Attending: Oncology

## 2017-03-31 ENCOUNTER — Encounter (HOSPITAL_COMMUNITY): Payer: Self-pay

## 2017-03-31 ENCOUNTER — Other Ambulatory Visit: Payer: Self-pay

## 2017-03-31 VITALS — BP 156/69 | HR 82 | Temp 98.0°F | Resp 16

## 2017-03-31 DIAGNOSIS — D510 Vitamin B12 deficiency anemia due to intrinsic factor deficiency: Secondary | ICD-10-CM

## 2017-03-31 MED ORDER — CYANOCOBALAMIN 1000 MCG/ML IJ SOLN
1000.0000 ug | Freq: Once | INTRAMUSCULAR | Status: AC
  Administered 2017-03-31: 1000 ug via INTRAMUSCULAR

## 2017-03-31 MED ORDER — CYANOCOBALAMIN 1000 MCG/ML IJ SOLN
INTRAMUSCULAR | Status: AC
  Filled 2017-03-31: qty 1

## 2017-03-31 NOTE — Progress Notes (Signed)
[name] presents today for injection per the provider's orders.  B12 administration without incident; see MAR for injection details.  Patient tolerated procedure well and without incident.  No questions or complaints noted at this time.  Discharged ambulatory.  

## 2017-04-28 ENCOUNTER — Inpatient Hospital Stay (HOSPITAL_COMMUNITY): Payer: PPO | Attending: Oncology

## 2017-04-28 ENCOUNTER — Other Ambulatory Visit: Payer: Self-pay

## 2017-04-28 ENCOUNTER — Encounter (HOSPITAL_COMMUNITY): Payer: Self-pay

## 2017-04-28 VITALS — BP 164/80 | HR 94 | Temp 98.1°F | Resp 18

## 2017-04-28 DIAGNOSIS — D510 Vitamin B12 deficiency anemia due to intrinsic factor deficiency: Secondary | ICD-10-CM | POA: Insufficient documentation

## 2017-04-28 MED ORDER — CYANOCOBALAMIN 1000 MCG/ML IJ SOLN
1000.0000 ug | Freq: Once | INTRAMUSCULAR | Status: AC
  Administered 2017-04-28: 1000 ug via INTRAMUSCULAR
  Filled 2017-04-28: qty 1

## 2017-04-28 NOTE — Progress Notes (Signed)
[name] presents today for injection per the provider's orders.  B12 administration without incident; see MAR for injection details.  Patient tolerated procedure well and without incident.  No questions or complaints noted at this time.  Discharged ambulatory.  

## 2017-05-05 DIAGNOSIS — I10 Essential (primary) hypertension: Secondary | ICD-10-CM | POA: Diagnosis not present

## 2017-05-05 DIAGNOSIS — E785 Hyperlipidemia, unspecified: Secondary | ICD-10-CM | POA: Diagnosis not present

## 2017-05-26 ENCOUNTER — Ambulatory Visit (HOSPITAL_COMMUNITY): Payer: PPO

## 2017-05-27 ENCOUNTER — Encounter (HOSPITAL_COMMUNITY): Payer: Self-pay

## 2017-05-27 ENCOUNTER — Inpatient Hospital Stay (HOSPITAL_COMMUNITY): Payer: PPO | Attending: Oncology

## 2017-05-27 ENCOUNTER — Other Ambulatory Visit: Payer: Self-pay

## 2017-05-27 VITALS — HR 85 | Temp 98.0°F | Resp 18

## 2017-05-27 DIAGNOSIS — D510 Vitamin B12 deficiency anemia due to intrinsic factor deficiency: Secondary | ICD-10-CM | POA: Insufficient documentation

## 2017-05-27 MED ORDER — CYANOCOBALAMIN 1000 MCG/ML IJ SOLN
1000.0000 ug | Freq: Once | INTRAMUSCULAR | Status: AC
  Administered 2017-05-27: 1000 ug via INTRAMUSCULAR

## 2017-05-27 MED ORDER — CYANOCOBALAMIN 1000 MCG/ML IJ SOLN
INTRAMUSCULAR | Status: AC
  Filled 2017-05-27: qty 1

## 2017-05-27 NOTE — Progress Notes (Signed)
[name] presents today for injection per the provider's orders.  B12 administration without incident; see MAR for injection details.  Patient tolerated procedure well and without incident.  No questions or complaints noted at this time.  Discharged ambulatory.  

## 2017-06-21 LAB — GLUCOSE, POCT (MANUAL RESULT ENTRY): POC GLUCOSE: 143 mg/dL — AB (ref 70–99)

## 2017-06-23 ENCOUNTER — Ambulatory Visit (HOSPITAL_COMMUNITY): Payer: PPO

## 2017-07-02 ENCOUNTER — Encounter (HOSPITAL_COMMUNITY): Payer: Self-pay

## 2017-07-02 ENCOUNTER — Inpatient Hospital Stay (HOSPITAL_COMMUNITY): Payer: PPO | Attending: Oncology

## 2017-07-02 VITALS — BP 134/53 | HR 80 | Temp 97.6°F | Resp 16

## 2017-07-02 DIAGNOSIS — D510 Vitamin B12 deficiency anemia due to intrinsic factor deficiency: Secondary | ICD-10-CM | POA: Insufficient documentation

## 2017-07-02 MED ORDER — CYANOCOBALAMIN 1000 MCG/ML IJ SOLN
INTRAMUSCULAR | Status: AC
  Filled 2017-07-02: qty 1

## 2017-07-02 MED ORDER — CYANOCOBALAMIN 1000 MCG/ML IJ SOLN
1000.0000 ug | Freq: Once | INTRAMUSCULAR | Status: AC
  Administered 2017-07-02: 1000 ug via INTRAMUSCULAR

## 2017-07-02 NOTE — Progress Notes (Signed)
[name] tolerated Vit B12 injection well without complaints or incident.VSS Pt discharged self ambulatory in satisfactory condition

## 2017-07-02 NOTE — Patient Instructions (Signed)
[facility] at [facility] Discharge Instructions  Received Vit B12 injection today. Follow-up as scheduled. Call clinic for any questions or concerns   Thank you for choosing [facility] at [facility] to provide your oncology and hematology care.  To afford each patient quality time with our provider, please arrive at least 15 minutes before your scheduled appointment time.   If you have a lab appointment with the Cancer Center please come in thru the  Main Entrance and check in at the main information desk  You need to re-schedule your appointment should you arrive 10 or more minutes late.  We strive to give you quality time with our providers, and arriving late affects you and other patients whose appointments are after yours.  Also, if you no show three or more times for appointments you may be dismissed from the clinic at the providers discretion.     Again, thank you for choosing [facility].  Our hope is that these requests will decrease the amount of time that you wait before being seen by our physicians.       _____________________________________________________________  Should you have questions after your visit to [facility], please contact our office at [phone] between the hours of 8:30 a.m. and 4:30 p.m.  Voicemails left after 4:30 p.m. will not be returned until the following business day.  For prescription refill requests, have your pharmacy contact our office.       Resources For Cancer Patients and their Caregivers ? American Cancer Society: Can assist with transportation, wigs, general needs, runs Look Good Feel Better.        1-888-227-6333 ? Cancer Care: Provides financial assistance, online support groups, medication/co-pay assistance.  1-800-813-HOPE (4673) ? [facility] Assists [place] cancer patients and their families through emotional , educational and  financial support.  336-427-4357 ? [place] DSS Where to apply for food stamps, Medicaid and utility assistance. 336-342-1394 ? RCATS: Transportation to medical appointments. 336-347-2287 ? Social Security Administration: May apply for disability if have a Stage IV cancer. 336-342-7796 1-800-772-1213 ? [place] Aging, Disability and Transit Services: Assists with nutrition, care and transit needs. 336-349-2343  Cancer Center Support Programs:   > Cancer Support Group  2nd Tuesday of the month 1pm-2pm, Journey Room   > Creative Journey  3rd Tuesday of the month 1130am-1pm, Journey Room    

## 2017-07-21 ENCOUNTER — Ambulatory Visit (HOSPITAL_COMMUNITY): Payer: PPO

## 2017-08-04 ENCOUNTER — Encounter (HOSPITAL_COMMUNITY): Payer: Self-pay

## 2017-08-04 ENCOUNTER — Other Ambulatory Visit: Payer: Self-pay

## 2017-08-04 ENCOUNTER — Inpatient Hospital Stay (HOSPITAL_COMMUNITY): Payer: PPO | Attending: Oncology

## 2017-08-04 VITALS — BP 179/71 | HR 76 | Temp 97.3°F | Resp 16

## 2017-08-04 DIAGNOSIS — D510 Vitamin B12 deficiency anemia due to intrinsic factor deficiency: Secondary | ICD-10-CM | POA: Insufficient documentation

## 2017-08-04 MED ORDER — CYANOCOBALAMIN 1000 MCG/ML IJ SOLN
1000.0000 ug | Freq: Once | INTRAMUSCULAR | Status: AC
  Administered 2017-08-04: 1000 ug via INTRAMUSCULAR
  Filled 2017-08-04: qty 1

## 2017-08-04 NOTE — Progress Notes (Signed)
[name] presents today for injection per the provider's orders.  B12 administration without incident; see MAR for injection details.  Patient tolerated procedure well and without incident.  No questions or complaints noted at this time.  Discharged ambulatory.

## 2017-08-11 DIAGNOSIS — Z6820 Body mass index (BMI) 20.0-20.9, adult: Secondary | ICD-10-CM | POA: Diagnosis not present

## 2017-08-11 DIAGNOSIS — E785 Hyperlipidemia, unspecified: Secondary | ICD-10-CM | POA: Diagnosis not present

## 2017-08-11 DIAGNOSIS — C7A092 Malignant carcinoid tumor of the stomach: Secondary | ICD-10-CM | POA: Diagnosis not present

## 2017-08-11 DIAGNOSIS — I10 Essential (primary) hypertension: Secondary | ICD-10-CM | POA: Diagnosis not present

## 2017-08-25 ENCOUNTER — Other Ambulatory Visit (HOSPITAL_COMMUNITY): Payer: PPO

## 2017-08-25 ENCOUNTER — Ambulatory Visit (HOSPITAL_COMMUNITY): Payer: PPO

## 2017-08-25 ENCOUNTER — Ambulatory Visit (HOSPITAL_COMMUNITY): Payer: PPO | Admitting: Hematology

## 2017-09-04 ENCOUNTER — Ambulatory Visit (HOSPITAL_COMMUNITY): Payer: PPO | Admitting: Internal Medicine

## 2017-09-04 ENCOUNTER — Ambulatory Visit (HOSPITAL_COMMUNITY): Payer: PPO

## 2017-09-04 ENCOUNTER — Other Ambulatory Visit (HOSPITAL_COMMUNITY): Payer: PPO

## 2017-09-29 ENCOUNTER — Inpatient Hospital Stay (HOSPITAL_COMMUNITY): Payer: PPO | Attending: Internal Medicine

## 2017-09-29 DIAGNOSIS — D510 Vitamin B12 deficiency anemia due to intrinsic factor deficiency: Secondary | ICD-10-CM | POA: Insufficient documentation

## 2017-09-29 DIAGNOSIS — C7A092 Malignant carcinoid tumor of the stomach: Secondary | ICD-10-CM | POA: Insufficient documentation

## 2017-09-29 DIAGNOSIS — D509 Iron deficiency anemia, unspecified: Secondary | ICD-10-CM | POA: Insufficient documentation

## 2017-09-29 DIAGNOSIS — D3A092 Benign carcinoid tumor of the stomach: Secondary | ICD-10-CM

## 2017-09-29 LAB — BASIC METABOLIC PANEL
Anion gap: 8 (ref 5–15)
BUN: 13 mg/dL (ref 8–23)
CALCIUM: 9.2 mg/dL (ref 8.9–10.3)
CO2: 26 mmol/L (ref 22–32)
CREATININE: 1.04 mg/dL — AB (ref 0.44–1.00)
Chloride: 107 mmol/L (ref 98–111)
GFR calc Af Amer: 52 mL/min — ABNORMAL LOW (ref >60)
GFR calc non Af Amer: 45 mL/min — ABNORMAL LOW (ref >60)
GLUCOSE: 92 mg/dL (ref 70–99)
Potassium: 3.8 mmol/L (ref 3.5–5.1)
Sodium: 141 mmol/L (ref 135–145)

## 2017-09-29 LAB — IRON AND TIBC
IRON: 49 ug/dL (ref 28–170)
Saturation Ratios: 16 % (ref 10.4–31.8)
TIBC: 303 ug/dL (ref 250–450)
UIBC: 254 ug/dL

## 2017-09-29 LAB — CBC WITH DIFFERENTIAL/PLATELET
Basophils Absolute: 0 10*3/uL (ref 0.0–0.1)
Basophils Relative: 0 %
EOS PCT: 1 %
Eosinophils Absolute: 0.1 10*3/uL (ref 0.0–0.7)
HEMATOCRIT: 38 % (ref 36.0–46.0)
Hemoglobin: 12 g/dL (ref 12.0–15.0)
LYMPHS PCT: 19 %
Lymphs Abs: 1 10*3/uL (ref 0.7–4.0)
MCH: 31.6 pg (ref 26.0–34.0)
MCHC: 31.6 g/dL (ref 30.0–36.0)
MCV: 100 fL (ref 78.0–100.0)
MONO ABS: 0.7 10*3/uL (ref 0.1–1.0)
Monocytes Relative: 13 %
NEUTROS ABS: 3.5 10*3/uL (ref 1.7–7.7)
Neutrophils Relative %: 67 %
Platelets: 158 10*3/uL (ref 150–400)
RBC: 3.8 MIL/uL — ABNORMAL LOW (ref 3.87–5.11)
RDW: 12.5 % (ref 11.5–15.5)
WBC: 5.2 10*3/uL (ref 4.0–10.5)

## 2017-09-29 LAB — FOLATE: Folate: 14.4 ng/mL (ref >5.9)

## 2017-09-29 LAB — FERRITIN: FERRITIN: 36 ng/mL (ref 11–307)

## 2017-09-29 LAB — VITAMIN B12: Vitamin B-12: 700 pg/mL (ref 180–914)

## 2017-10-01 LAB — CHROMOGRANIN A: Chromogranin A: 14 nmol/L — ABNORMAL HIGH (ref 0–5)

## 2017-10-01 LAB — SEROTONIN SERUM: Serotonin, Serum: 89 ng/mL (ref 0–420)

## 2017-10-06 ENCOUNTER — Other Ambulatory Visit: Payer: Self-pay

## 2017-10-06 ENCOUNTER — Inpatient Hospital Stay (HOSPITAL_COMMUNITY): Payer: PPO | Attending: Internal Medicine | Admitting: Internal Medicine

## 2017-10-06 ENCOUNTER — Encounter (HOSPITAL_COMMUNITY): Payer: Self-pay | Admitting: Internal Medicine

## 2017-10-06 ENCOUNTER — Inpatient Hospital Stay (HOSPITAL_COMMUNITY): Payer: PPO

## 2017-10-06 VITALS — BP 149/61 | HR 89 | Temp 97.9°F | Resp 18

## 2017-10-06 DIAGNOSIS — Z79899 Other long term (current) drug therapy: Secondary | ICD-10-CM | POA: Diagnosis not present

## 2017-10-06 DIAGNOSIS — I10 Essential (primary) hypertension: Secondary | ICD-10-CM | POA: Diagnosis not present

## 2017-10-06 DIAGNOSIS — D510 Vitamin B12 deficiency anemia due to intrinsic factor deficiency: Secondary | ICD-10-CM | POA: Insufficient documentation

## 2017-10-06 DIAGNOSIS — D509 Iron deficiency anemia, unspecified: Secondary | ICD-10-CM

## 2017-10-06 DIAGNOSIS — E119 Type 2 diabetes mellitus without complications: Secondary | ICD-10-CM | POA: Diagnosis not present

## 2017-10-06 DIAGNOSIS — C7A092 Malignant carcinoid tumor of the stomach: Secondary | ICD-10-CM | POA: Diagnosis not present

## 2017-10-06 DIAGNOSIS — E538 Deficiency of other specified B group vitamins: Secondary | ICD-10-CM | POA: Insufficient documentation

## 2017-10-06 DIAGNOSIS — D3A092 Benign carcinoid tumor of the stomach: Secondary | ICD-10-CM

## 2017-10-06 NOTE — Patient Instructions (Addendum)
[place] at [facility] Discharge Instructions   You were seen today by Dr. Zoila Shutter  Your lab work was reviewed with you by Dr. Walden Field. Your blood counts, kidney function, and liver function labs are all normal.  Your tumor marker lab number was 14, which is up from 9 on 02/27/17. We will recheck it in 6 months since you aren't having any issues.  You will follow up with [place] in 6 months with lab work.  Thank you for choosing [place] at [facility] to provide your oncology and hematology care.  To afford each patient quality time with our provider, please arrive at least 15 minutes before your scheduled appointment time.    If you have a lab appointment with the [facility] please come in thru the  Main Entrance and check in at the main information desk  You need to re-schedule your appointment should you arrive 10 or more minutes late.  We strive to give you quality time with our providers, and arriving late affects you and other patients whose appointments are after yours.  Also, if you no show three or more times for appointments you may be dismissed from the clinic at the providers discretion.     Again, thank you for choosing [facility].  Our hope is that these requests will decrease the amount of time that you wait before being seen by our physicians.       _____________________________________________________________  Should you have questions after your visit to [facility], please contact our office at [phone] between the hours of 8:30 a.m. and 4:30 p.m.  Voicemails left after 4:30 p.m. will not be returned until the following business day.  For prescription refill requests, have your pharmacy contact our office.       Resources For Cancer Patients and their Caregivers ? American Cancer Society: Can assist with transportation, wigs, general needs, runs Look Good Feel Better.         (229) 496-8455 ? Cancer Care: Provides financial assistance, online support groups, medication/co-pay assistance.  1-800-813-HOPE 7075645829) ? [facility] Assists [place] cancer patients and their families through emotional , educational and financial support.  581-531-3903 ? [place] DSS Where to apply for food stamps, Medicaid and utility assistance. 3856614598 ? RCATS: Transportation to medical appointments. 435-490-8829 ? Social Security Administration: May apply for disability if have a Stage IV cancer. 856-567-1591 (437)856-2156 ? [employer], Disability and Transit Services: Assists with nutrition, care and transit needs. [place] Support Programs:   > Cancer Support Group  2nd Tuesday of the month 1pm-2pm, Journey Room   > Creative Journey  3rd Tuesday of the month 1130am-1pm, Journey Room

## 2017-10-06 NOTE — Progress Notes (Signed)
Patient does not need b12 shot today per oncologist.

## 2017-10-06 NOTE — Progress Notes (Signed)
Diagnosis Pernicious anemia - Plan: CBC with Differential/Platelet, Comprehensive metabolic panel, Lactate dehydrogenase, Ferritin, Chromogranin A, Vitamin B12, Serotonin serum  Neuroendocrine carcinoid tumor of stomach - Plan: CBC with Differential/Platelet, Comprehensive metabolic panel, Lactate dehydrogenase, Ferritin, Chromogranin A, Vitamin B12, Serotonin serum  Iron deficiency anemia, unspecified iron deficiency anemia type - Plan: CBC with Differential/Platelet, Comprehensive metabolic panel, Lactate dehydrogenase, Ferritin, Chromogranin A, Vitamin B12, Serotonin serum  Staging Cancer Staging No matching staging information was found for the patient.  Assessment and Plan:  1.  Low-grade neuroendocrine carcinoid tumor of stomach: Pt remains asymptomatic.  Chromogranin A levels have historically been elevated, and fluctuates on review of chart.    Labs done 09/29/2017 reviewed and showed WBC 5.2 HB 12 plts 158,000.  Chemistries WNL with K+ 3.9, Cr 1.  Normal LFTs on labs done 02/2017.  CHR A level is 14 which is similar to labs done in 02/2016 but elevated from Labs done 02/2017.  Serotonin level is WNL at 89.  Pt will RTC in 03/2018 for follow-up and repeat labs.  She is advised to notify the office if any problems prior to her next visit.    2.  B12 deficiency.  B12 level 700.  Pt will try oral B12 and repeat levels on RTC.    3.  IDA.  Pt was last treated with IV iron in 03/2016.  Labs done 09/29/2017 showed HB 12 and ferritin of 36.  Will repeat lab on RTC in 03/2018.   4.  HTN.  BP is 149/61.  Follow-up with PCP.     Current Status:  Pt is seen today for follow-up.  She denies diarrhea or abdominal pain.     Problem List Patient Active Problem List   Diagnosis Date Noted  . Carotid disease, bilateral ([place]) [I77.9] 04/19/2014  . Syncope [R55] 04/18/2014  . AKI (acute kidney injury) ([place]) [N17.9] 04/18/2014  . Mild memory loss [R41.3] 02/15/2011  . Pernicious anemia [D51.0]  06/14/2010  . Neuroendocrine carcinoid tumor of stomach [D3A.092] 09/28/2009  . DM [E11.9] 04/03/2009  . HLD (hyperlipidemia) [E78.5] 04/03/2009  . Essential hypertension [I10] 04/03/2009  . GERD [K21.9] 04/03/2009  . WEAKNESS [R53.81, R53.83] 04/03/2009    Past Medical History Past Medical History:  Diagnosis Date  . Adult BMI 19-24 kg/sq m 2008 125 lbs  . Anemia    Pernicious 2008-HB 12 MCV 119.5; JAN 2012 HB 13.2 MCV 97.6  . Carcinoid tumor of stomach 2008  . Diverticulosis   . GERD (gastroesophageal reflux disease)   . Hiatal hernia   . HTN (hypertension)   . Hyperlipidemia   . 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 Pernicious anemia   . Pernicious anemia 06/14/2010  . Wrist fracture, left 2008    Past Surgical History Past Surgical History:  Procedure Laterality Date  . COLONOSCOPY  2003 LS   DC/[place] Diverticulosis  . EUS  2008 DJ   FNA NO CARCINOID  . EUS  JAN 2009   NL EXAM, NO Bx  . EUS  MAR 2011 WO   CARCINOID  . UPPER GASTROINTESTINAL ENDOSCOPY  APR 2008 SLF   CARCIONOID  . UPPER GASTROINTESTINAL ENDOSCOPY  FEB 2011 SLF   CARCINOID/mild anemia/large hiatal hernia    Family History Family History  Problem Relation Age of Onset  . Colon cancer Neg Hx   . Colon polyps Neg Hx      Social History  reports that she has never smoked. She has never used smokeless tobacco. She reports that she  does not drink alcohol or use drugs.  Medications  Current Outpatient Medications:  .  acetaminophen (TYLENOL) 500 MG tablet, Take 500 mg by mouth every 6 (six) hours as needed. For pain, Disp: , Rfl:  .  ADVANCED FIBER COMPLEX PO, Take 1 tablet by mouth 2 (two) times daily. Takes a gummy twice daily, Disp: , Rfl:  .  calcium carbonate (TUMS - DOSED IN MG ELEMENTAL CALCIUM) 500 MG chewable tablet, Chew 2 tablets by mouth daily as needed for indigestion. , Disp: , Rfl:  .  Cyanocobalamin (VITAMIN B-12 IJ), Inject 1,000 mcg as directed every 30 (thirty) days.  , Disp: , Rfl:  .   diphenhydrAMINE (BENADRYL) 25 mg capsule, Take 25 mg by mouth every 6 (six) hours as needed., Disp: , Rfl:  .  donepezil (ARICEPT) 5 MG tablet, , Disp: , Rfl: 0 .  ENSURE PLUS (ENSURE PLUS) LIQD, Take 237 mLs by mouth daily., Disp: , Rfl:  .  famotidine (PEPCID) 20 MG tablet, Take 20 mg by mouth daily. , Disp: , Rfl:  .  felodipine (PLENDIL) 5 MG 24 hr tablet, Take 5 mg by mouth daily. , Disp: , Rfl:  .  simvastatin (ZOCOR) 40 MG tablet, Take 40 mg by mouth at bedtime.  , Disp: , Rfl:  .  trimethoprim-polymyxin b (POLYTRIM) ophthalmic solution, Place 1 drop into the right eye every 4 (four) hours., Disp: 10 mL, Rfl: 0  Allergies Codeine  Review of Systems Review of Systems - Oncology ROS negative   Physical Exam  Vitals Wt Readings from Last 3 Encounters:  10/06/17 109 lb 12.8 oz (49.8 kg)  02/27/17 115 lb 3.2 oz (52.3 kg)  08/27/16 110 lb 8 oz (50.1 kg)   Temp Readings from Last 3 Encounters:  10/06/17 97.9 F (36.6 C) (Oral)  08/04/17 (!) 97.3 F (36.3 C) (Oral)  07/02/17 97.6 F (36.4 C) (Oral)   BP Readings from Last 3 Encounters:  10/06/17 (!) 149/61  08/04/17 (!) 179/71  07/02/17 (!) 134/53   Pulse Readings from Last 3 Encounters:  10/06/17 89  08/04/17 76  07/02/17 80    Constitutional: Well-developed, well-nourished, and in no distress.   HENT: Head: Normocephalic and atraumatic.  Mouth/Throat: No oropharyngeal exudate. Mucosa moist. Eyes: Pupils are equal, round, and reactive to light. Conjunctivae are normal. No scleral icterus.  Neck: Normal range of motion. Neck supple. No JVD present.  Cardiovascular: Normal rate, regular rhythm and normal heart sounds.  Exam reveals no gallop and no friction rub.   No murmur heard. Pulmonary/Chest: Effort normal and breath sounds normal. No respiratory distress. No wheezes.No rales.  Abdominal: Soft. Bowel sounds are normal. No distension. There is no tenderness. There is no guarding.  Musculoskeletal: No edema or  tenderness.  Lymphadenopathy: No cervical, axillary or supraclavicular adenopathy.  Neurological: Alert and oriented to person, place, and time. No cranial nerve deficit.  Skin: Skin is warm and dry. No rash noted. No erythema. No pallor.  Psychiatric: Affect and judgment normal.   Labs No visits with results within 3 Day(s) from this visit.  Latest known visit with results is:  Appointment on 09/29/2017  Component Date Value Ref Range Status  . Vitamin B-12 09/29/2017 700  180 - 914 pg/mL Final   Comment: (NOTE) This assay is not validated for testing neonatal or myeloproliferative syndrome specimens for Vitamin B12 levels. Performed at [facility], [address] [place], [place] [address]   . Folate 09/29/2017 14.4  >5.9 ng/mL Final  Performed at [facility], [address] [place], [place] [address]  . Iron 09/29/2017 49  28 - 170 ug/dL Final  . TIBC 09/29/2017 303  250 - 450 ug/dL Final  . Saturation Ratios 09/29/2017 16  10.4 - 31.8 % Final  . UIBC 09/29/2017 254  ug/dL Final   Performed at [facility], [address] [place], [place] [address]  . Ferritin 09/29/2017 36  11 - 307 ng/mL Final   Performed at [facility], [address] [place], [place] [address]  . WBC 09/29/2017 5.2  4.0 - 10.5 K/uL Final  . RBC 09/29/2017 3.80* 3.87 - 5.11 MIL/uL Final  . Hemoglobin 09/29/2017 12.0  12.0 - 15.0 g/dL Final  . HCT 09/29/2017 38.0  36.0 - 46.0 % Final  . MCV 09/29/2017 100.0  78.0 - 100.0 fL Final  . MCH 09/29/2017 31.6  26.0 - 34.0 pg Final  . MCHC 09/29/2017 31.6  30.0 - 36.0 g/dL Final  . RDW 09/29/2017 12.5  11.5 - 15.5 % Final  . Platelets 09/29/2017 158  150 - 400 K/uL Final  . Neutrophils Relative % 09/29/2017 67  % Final  . Neutro Abs 09/29/2017 3.5  1.7 - 7.7 K/uL Final  . Lymphocytes Relative 09/29/2017 19  % Final  . Lymphs Abs 09/29/2017 1.0  0.7 - 4.0 K/uL Final  . Monocytes Relative 09/29/2017 13  % Final  . Monocytes Absolute 09/29/2017 0.7  0.1 - 1.0  K/uL Final  . Eosinophils Relative 09/29/2017 1  % Final  . Eosinophils Absolute 09/29/2017 0.1  0.0 - 0.7 K/uL Final  . Basophils Relative 09/29/2017 0  % Final  . Basophils Absolute 09/29/2017 0.0  0.0 - 0.1 K/uL Final   Performed at [facility], [address] [place], [place] [address]  . Sodium 09/29/2017 141  135 - 145 mmol/L Final  . Potassium 09/29/2017 3.8  3.5 - 5.1 mmol/L Final  . Chloride 09/29/2017 107  98 - 111 mmol/L Final  . CO2 09/29/2017 26  22 - 32 mmol/L Final  . Glucose, Bld 09/29/2017 92  70 - 99 mg/dL Final  . BUN 09/29/2017 13  8 - 23 mg/dL Final  . Creatinine, Ser 09/29/2017 1.04* 0.44 - 1.00 mg/dL Final  . Calcium 09/29/2017 9.2  8.9 - 10.3 mg/dL Final  . GFR calc non Af Amer 09/29/2017 45* >60 mL/min Final  . GFR calc Af Amer 09/29/2017 52* >60 mL/min Final   Comment: (NOTE) The eGFR has been calculated using the CKD EPI equation. This calculation has not been validated in all clinical situations. eGFR's persistently <60 mL/min signify possible Chronic Kidney Disease.   Georgiann Hahn gap 09/29/2017 8  5 - 15 Final   Performed at [facility], [address], [place],  [address]  . Chromogranin A 09/29/2017 14* 0 - 5 nmol/L Final   Comment: (NOTE) Chromogranin A performed by [employer] methodology. Results for this test are designated to be for research purposes only by the assay's manufacturer. The performance characteristics of this product have not been established. Results for this test should not be used as absolute evidence of presence or absence of malignant disease without confirmation of the diagnosis by another medically established diagnostic product or procedure. Values obtained with different assay methods or kits cannot be used interchangeably. Performed At: [facility] [place], [place] [id number] Rush Farmer MD [id number]   . Serotonin, Serum 09/29/2017 89  0 - 420 ng/mL Final   Comment:  (  NOTE) Performed At: [facility] [place], [place] [id number] Rush Farmer MD [id number]      Pathology Orders Placed This Encounter  Procedures  . CBC with Differential/Platelet    Standing Status:   Future    Standing Expiration Date:   10/07/2018  . Comprehensive metabolic panel    Standing Status:   Future    Standing Expiration Date:   10/07/2018  . Lactate dehydrogenase    Standing Status:   Future    Standing Expiration Date:   10/07/2018  . Ferritin    Standing Status:   Future    Standing Expiration Date:   10/07/2018  . Chromogranin A    Standing Status:   Future    Standing Expiration Date:   10/07/2018  . Vitamin B12    Standing Status:   Future    Standing Expiration Date:   10/07/2018  . Serotonin serum    Standing Status:   Future    Standing Expiration Date:   10/07/2018       Zoila Shutter MD

## 2017-11-10 DIAGNOSIS — D510 Vitamin B12 deficiency anemia due to intrinsic factor deficiency: Secondary | ICD-10-CM | POA: Diagnosis not present

## 2017-11-10 DIAGNOSIS — E785 Hyperlipidemia, unspecified: Secondary | ICD-10-CM | POA: Diagnosis not present

## 2017-11-10 DIAGNOSIS — I10 Essential (primary) hypertension: Secondary | ICD-10-CM | POA: Diagnosis not present

## 2018-02-10 DIAGNOSIS — F0390 Unspecified dementia without behavioral disturbance: Secondary | ICD-10-CM | POA: Diagnosis not present

## 2018-02-10 DIAGNOSIS — Z0000 Encounter for general adult medical examination without abnormal findings: Secondary | ICD-10-CM | POA: Diagnosis not present

## 2018-02-10 DIAGNOSIS — I129 Hypertensive chronic kidney disease with stage 1 through stage 4 chronic kidney disease, or unspecified chronic kidney disease: Secondary | ICD-10-CM | POA: Diagnosis not present

## 2018-02-10 DIAGNOSIS — Z6820 Body mass index (BMI) 20.0-20.9, adult: Secondary | ICD-10-CM | POA: Diagnosis not present

## 2018-02-10 DIAGNOSIS — D3A092 Benign carcinoid tumor of the stomach: Secondary | ICD-10-CM | POA: Diagnosis not present

## 2018-03-16 ENCOUNTER — Other Ambulatory Visit: Payer: Self-pay | Admitting: Internal Medicine

## 2018-03-16 DIAGNOSIS — D3A092 Benign carcinoid tumor of the stomach: Secondary | ICD-10-CM

## 2018-04-06 ENCOUNTER — Other Ambulatory Visit: Payer: Self-pay

## 2018-04-06 ENCOUNTER — Inpatient Hospital Stay (HOSPITAL_COMMUNITY): Payer: PPO | Attending: Internal Medicine

## 2018-04-06 ENCOUNTER — Other Ambulatory Visit (HOSPITAL_COMMUNITY): Payer: PPO

## 2018-04-06 DIAGNOSIS — E538 Deficiency of other specified B group vitamins: Secondary | ICD-10-CM | POA: Diagnosis not present

## 2018-04-06 DIAGNOSIS — D510 Vitamin B12 deficiency anemia due to intrinsic factor deficiency: Secondary | ICD-10-CM

## 2018-04-06 DIAGNOSIS — D509 Iron deficiency anemia, unspecified: Secondary | ICD-10-CM | POA: Diagnosis not present

## 2018-04-06 DIAGNOSIS — D3A092 Benign carcinoid tumor of the stomach: Secondary | ICD-10-CM

## 2018-04-06 DIAGNOSIS — C7A092 Malignant carcinoid tumor of the stomach: Secondary | ICD-10-CM | POA: Insufficient documentation

## 2018-04-06 LAB — CBC WITH DIFFERENTIAL/PLATELET
ABS IMMATURE GRANULOCYTES: 0.02 10*3/uL (ref 0.00–0.07)
BASOS PCT: 0 %
Basophils Absolute: 0 10*3/uL (ref 0.0–0.1)
Eosinophils Absolute: 0 10*3/uL (ref 0.0–0.5)
Eosinophils Relative: 1 %
HCT: 38.7 % (ref 36.0–46.0)
Hemoglobin: 11.7 g/dL — ABNORMAL LOW (ref 12.0–15.0)
IMMATURE GRANULOCYTES: 0 %
Lymphocytes Relative: 18 %
Lymphs Abs: 1.2 10*3/uL (ref 0.7–4.0)
MCH: 31 pg (ref 26.0–34.0)
MCHC: 30.2 g/dL (ref 30.0–36.0)
MCV: 102.4 fL — ABNORMAL HIGH (ref 80.0–100.0)
MONOS PCT: 9 %
Monocytes Absolute: 0.6 10*3/uL (ref 0.1–1.0)
NEUTROS ABS: 4.7 10*3/uL (ref 1.7–7.7)
NEUTROS PCT: 72 %
NRBC: 0 % (ref 0.0–0.2)
PLATELETS: 181 10*3/uL (ref 150–400)
RBC: 3.78 MIL/uL — AB (ref 3.87–5.11)
RDW: 12.2 % (ref 11.5–15.5)
WBC: 6.6 10*3/uL (ref 4.0–10.5)

## 2018-04-06 LAB — LACTATE DEHYDROGENASE: LDH: 172 U/L (ref 98–192)

## 2018-04-06 LAB — COMPREHENSIVE METABOLIC PANEL
ALBUMIN: 4.2 g/dL (ref 3.5–5.0)
ALK PHOS: 78 U/L (ref 38–126)
ALT: 11 U/L (ref 0–44)
ANION GAP: 9 (ref 5–15)
AST: 22 U/L (ref 15–41)
BILIRUBIN TOTAL: 0.6 mg/dL (ref 0.3–1.2)
BUN: 14 mg/dL (ref 8–23)
CALCIUM: 9.1 mg/dL (ref 8.9–10.3)
CO2: 25 mmol/L (ref 22–32)
Chloride: 106 mmol/L (ref 98–111)
Creatinine, Ser: 0.99 mg/dL (ref 0.44–1.00)
GFR calc non Af Amer: 49 mL/min — ABNORMAL LOW (ref >60)
GFR, EST AFRICAN AMERICAN: 57 mL/min — AB (ref >60)
Glucose, Bld: 95 mg/dL (ref 70–99)
Potassium: 4 mmol/L (ref 3.5–5.1)
SODIUM: 140 mmol/L (ref 135–145)
TOTAL PROTEIN: 7.9 g/dL (ref 6.5–8.1)

## 2018-04-06 LAB — FERRITIN: FERRITIN: 34 ng/mL (ref 11–307)

## 2018-04-06 LAB — VITAMIN B12: VITAMIN B 12: 339 pg/mL (ref 180–914)

## 2018-04-07 LAB — CHROMOGRANIN A: CHROMOGRANIN A (NG/ML): 694.4 ng/mL — AB (ref 0.0–101.8)

## 2018-04-08 LAB — SEROTONIN SERUM: SEROTONIN, SERUM: 101 ng/mL (ref 0–420)

## 2018-04-13 ENCOUNTER — Other Ambulatory Visit: Payer: Self-pay

## 2018-04-13 ENCOUNTER — Encounter (HOSPITAL_COMMUNITY): Payer: Self-pay | Admitting: Hematology

## 2018-04-13 ENCOUNTER — Inpatient Hospital Stay (HOSPITAL_BASED_OUTPATIENT_CLINIC_OR_DEPARTMENT_OTHER): Payer: PPO | Admitting: Hematology

## 2018-04-13 VITALS — BP 160/63 | HR 95 | Temp 98.6°F | Resp 16 | Wt 111.2 lb

## 2018-04-13 DIAGNOSIS — C7A092 Malignant carcinoid tumor of the stomach: Secondary | ICD-10-CM | POA: Diagnosis not present

## 2018-04-13 DIAGNOSIS — D3A092 Benign carcinoid tumor of the stomach: Secondary | ICD-10-CM

## 2018-04-13 DIAGNOSIS — E538 Deficiency of other specified B group vitamins: Secondary | ICD-10-CM | POA: Diagnosis not present

## 2018-04-13 DIAGNOSIS — D509 Iron deficiency anemia, unspecified: Secondary | ICD-10-CM

## 2018-04-13 DIAGNOSIS — D510 Vitamin B12 deficiency anemia due to intrinsic factor deficiency: Secondary | ICD-10-CM

## 2018-04-13 NOTE — Progress Notes (Signed)
[place], [place] [id number]   CLINIC:  Medical Oncology/Hematology  PCP:  Iona Beard, [address] [place] [address] [phone]   REASON FOR VISIT:  Follow-up for Neuroendocrine carcinoid tumor of stomach, Iron deficiency anemia    INTERVAL HISTORY:  Ms. Dehne 85 y.o. female returns for routine follow-up. She is here today alone. She states that she has had dental work recently and she is unable to eat solid foods. Denies any nausea, vomiting, or diarrhea. Denies any new pains. Had not noticed any recent bleeding such as epistaxis, hematuria or hematochezia. Denies recent chest pain on exertion, shortness of breath on minimal exertion, pre-syncopal episodes, or palpitations. Denies any numbness or tingling in hands or feet. Denies any recent fevers, infections, or recent hospitalizations. Patient reports appetite at 75% and energy level at 75%.   REVIEW OF SYSTEMS:  Review of Systems  All other systems reviewed and are negative.    PAST MEDICAL/SURGICAL HISTORY:  Past Medical History:  Diagnosis Date  . Adult BMI 19-24 kg/sq m 2008 125 lbs  . Anemia    Pernicious 2008-HB 12 MCV 119.5; JAN 2012 HB 13.2 MCV 97.6  . Carcinoid tumor of stomach 2008  . Diverticulosis   . GERD (gastroesophageal reflux disease)   . Hiatal hernia   . HTN (hypertension)   . Hyperlipidemia   . 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 Pernicious anemia   . Pernicious anemia 06/14/2010  . Wrist fracture, left 2008   Past Surgical History:  Procedure Laterality Date  . COLONOSCOPY  2003 LS   DC/[place] Diverticulosis  . EUS  2008 DJ   FNA NO CARCINOID  . EUS  JAN 2009   NL EXAM, NO Bx  . EUS  MAR 2011 WO   CARCINOID  . UPPER GASTROINTESTINAL ENDOSCOPY  APR 2008 SLF   CARCIONOID  . UPPER GASTROINTESTINAL ENDOSCOPY  FEB 2011 SLF   CARCINOID/mild anemia/large hiatal hernia     SOCIAL HISTORY:  Social History   Socioeconomic History  . Marital  status: Widowed    Spouse name: Not on file  . Number of children: Not on file  . Years of education: Not on file  . Highest education level: Not on file  Occupational History  . Not on file  Social Needs  . Financial resource strain: Not on file  . Food insecurity:    Worry: Not on file    Inability: Not on file  . Transportation needs:    Medical: Not on file    Non-medical: Not on file  Tobacco Use  . Smoking status: Never Smoker  . Smokeless tobacco: Never Used  Substance and Sexual Activity  . Alcohol use: No  . Drug use: No  . Sexual activity: Not Currently    Birth control/protection: Post-menopausal  Lifestyle  . Physical activity:    Days per week: Not on file    Minutes per session: Not on file  . Stress: Not on file  Relationships  . Social connections:    Talks on phone: Not on file    Gets together: Not on file    Attends religious service: Not on file    Active member of club or organization: Not on file    Attends meetings of clubs or organizations: Not on file    Relationship status: Not on file  . Intimate partner violence:    Fear of current or ex partner: Not on  file    Emotionally abused: Not on file    Physically abused: Not on file    Forced sexual activity: Not on file  Other Topics Concern  . Not on file  Social History Narrative  . Not on file    FAMILY HISTORY:  Family History  Problem Relation Age of Onset  . Colon cancer Neg Hx   . Colon polyps Neg Hx     CURRENT MEDICATIONS:  Outpatient Encounter Medications as of 04/13/2018  Medication Sig Note  . acetaminophen (TYLENOL) 500 MG tablet Take 500 mg by mouth every 6 (six) hours as needed. For pain   . ADVANCED FIBER COMPLEX PO Take 1 tablet by mouth 2 (two) times daily. Takes a gummy twice daily   . calcium carbonate (TUMS - DOSED IN MG ELEMENTAL CALCIUM) 500 MG chewable tablet Chew 2 tablets by mouth daily as needed for indigestion.    . Cyanocobalamin (VITAMIN B-12 IJ) Inject  1,000 mcg as directed every 30 (thirty) days.   04/18/2014: Patient states that she received the 1st of this month  . diphenhydrAMINE (BENADRYL) 25 mg capsule Take 25 mg by mouth every 6 (six) hours as needed.   . donepezil (ARICEPT) 5 MG tablet    . ENSURE PLUS (ENSURE PLUS) LIQD Take 237 mLs by mouth daily. 04/20/2015: Patient states she doesn't drink it everyday, just every so often.  . famotidine (PEPCID) 20 MG tablet Take 20 mg by mouth daily.    . felodipine (PLENDIL) 5 MG 24 hr tablet Take 5 mg by mouth daily.    . simvastatin (ZOCOR) 40 MG tablet Take 40 mg by mouth at bedtime.     Marland Kitchen trimethoprim-polymyxin b (POLYTRIM) ophthalmic solution Place 1 drop into the right eye every 4 (four) hours.    No facility-administered encounter medications on file as of 04/13/2018.     ALLERGIES:  Allergies  Allergen Reactions  . Codeine      PHYSICAL EXAM:  ECOG Performance status: 1  Vitals:   04/13/18 1110  BP: (!) 160/63  Pulse: 95  Resp: 16  Temp: 98.6 F (37 C)  SpO2: 100%   Filed Weights   04/13/18 1110  Weight: 111 lb 3.2 oz (50.4 kg)    Physical Exam Constitutional:      Appearance: Normal appearance.  Cardiovascular:     Rate and Rhythm: Normal rate and regular rhythm.     Heart sounds: Normal heart sounds.  Pulmonary:     Effort: Pulmonary effort is normal.     Breath sounds: Normal breath sounds.  Abdominal:     Palpations: Abdomen is soft. There is no mass.     Tenderness: There is no abdominal tenderness.  Musculoskeletal:        General: No swelling.  Skin:    General: Skin is warm.  Neurological:     General: No focal deficit present.     Mental Status: She is alert and oriented to person, place, and time.  Psychiatric:        Mood and Affect: Mood normal.        Behavior: Behavior normal.      LABORATORY DATA:  I have reviewed the labs as listed.  CBC    Component Value Date/Time   WBC 6.6 04/06/2018 1457   RBC 3.78 (L) 04/06/2018 1457   HGB  11.7 (L) 04/06/2018 1457   HCT 38.7 04/06/2018 1457   PLT 181 04/06/2018 1457   MCV 102.4 (H) 04/06/2018 1457  MCH 31.0 04/06/2018 1457   MCHC 30.2 04/06/2018 1457   RDW 12.2 04/06/2018 1457   LYMPHSABS 1.2 04/06/2018 1457   MONOABS 0.6 04/06/2018 1457   EOSABS 0.0 04/06/2018 1457   BASOSABS 0.0 04/06/2018 1457   CMP Latest Ref Rng & Units 04/06/2018 09/29/2017 02/27/2017  Glucose 70 - 99 mg/dL 95 92 105(H)  BUN 8 - 23 mg/dL 14 13 22(H)  Creatinine 0.44 - 1.00 mg/dL 0.99 1.04(H) 1.00  Sodium 135 - 145 mmol/L 140 141 140  Potassium 3.5 - 5.1 mmol/L 4.0 3.8 3.8  Chloride 98 - 111 mmol/L 106 107 105  CO2 22 - 32 mmol/L 25 26 23   Calcium 8.9 - 10.3 mg/dL 9.1 9.2 9.5  Total Protein 6.5 - 8.1 g/dL 7.9 - 7.8  Total Bilirubin 0.3 - 1.2 mg/dL 0.6 - 0.8  Alkaline Phos 38 - 126 U/L 78 - 97  AST 15 - 41 U/L 22 - 25  ALT 0 - 44 U/L 11 - 14       DIAGNOSTIC IMAGING:  I have independently reviewed the scans and discussed with the patient.   I have reviewed Venita Lick LPN's note and agree with the documentation.  I personally performed a face-to-face visit, made revisions and my assessment and plan is as follows.    ASSESSMENT & PLAN:   Neuroendocrine carcinoid tumor of stomach 1.  Well-differentiated carcinoid of the stomach: - Biopsy of the stomach in 2011 consistent with carcinoid tumor. - She is being followed with labs every 6 months. - She does not have any clinical signs or symptoms of carcinoid syndrome. - Physical exam was benign.  Serum chromogranin was elevated.  Serum serotonin was normal. -We will see her back in 6 months for follow-up with repeat blood work.  2.  B12 deficiency: -She was on monthly B12 shots on 2 08/04/2017. -B12 today was 339.  We will plan to check it at next visit. - If it is severely low, she will be put back on B12 shots.  3.  Iron deficiency state: -Ferritin was 34 and stable.  She denies any bleeding per rectum or melena. -Last Feraheme was  on 03/22/2016.      Orders placed this encounter:  Orders Placed This Encounter  Procedures  . CBC with Differential/Platelet  . Comprehensive metabolic panel  . Iron and TIBC  . Ferritin  . Vitamin B12  . Folate  . Chromogranin A      Derek Jack, MD [place] [phone]

## 2018-04-13 NOTE — Assessment & Plan Note (Signed)
1.  Well-differentiated carcinoid of the stomach: - Biopsy of the stomach in 2011 consistent with carcinoid tumor. - She is being followed with labs every 6 months. - She does not have any clinical signs or symptoms of carcinoid syndrome. - Physical exam was benign.  Serum chromogranin was elevated.  Serum serotonin was normal. -We will see her back in 6 months for follow-up with repeat blood work.  2.  B12 deficiency: -She was on monthly B12 shots on 2 08/04/2017. -B12 today was 339.  We will plan to check it at next visit. - If it is severely low, she will be put back on B12 shots.  3.  Iron deficiency state: -Ferritin was 34 and stable.  She denies any bleeding per rectum or melena. -Last Feraheme was on 03/22/2016.

## 2018-04-13 NOTE — Patient Instructions (Addendum)
[facility] at [facility] Discharge Instructions  You were seen today by Dr. Katragadda. He went over your recent lab results. He will see you back in 6 months for labs and follow up.   Thank you for choosing [facility] at [facility] to provide your oncology and hematology care.  To afford each patient quality time with our provider, please arrive at least 15 minutes before your scheduled appointment time.   If you have a lab appointment with the Cancer Center please come in thru the  Main Entrance and check in at the main information desk  You need to re-schedule your appointment should you arrive 10 or more minutes late.  We strive to give you quality time with our providers, and arriving late affects you and other patients whose appointments are after yours.  Also, if you no show three or more times for appointments you may be dismissed from the clinic at the providers discretion.     Again, thank you for choosing [facility].  Our hope is that these requests will decrease the amount of time that you wait before being seen by our physicians.       _____________________________________________________________  Should you have questions after your visit to [facility], please contact our office at [phone] between the hours of 8:00 a.m. and 4:30 p.m.  Voicemails left after 4:00 p.m. will not be returned until the following business day.  For prescription refill requests, have your pharmacy contact our office and allow 72 hours.    Cancer Center Support Programs:   > Cancer Support Group  2nd Tuesday of the month 1pm-2pm, Journey Room    

## 2018-05-18 DIAGNOSIS — I129 Hypertensive chronic kidney disease with stage 1 through stage 4 chronic kidney disease, or unspecified chronic kidney disease: Secondary | ICD-10-CM | POA: Diagnosis not present

## 2018-05-18 DIAGNOSIS — H5789 Other specified disorders of eye and adnexa: Secondary | ICD-10-CM | POA: Diagnosis not present

## 2018-10-07 ENCOUNTER — Inpatient Hospital Stay (HOSPITAL_COMMUNITY): Payer: PPO | Attending: Hematology

## 2018-10-14 ENCOUNTER — Ambulatory Visit (HOSPITAL_COMMUNITY): Payer: PPO | Admitting: Hematology

## 2018-12-07 DIAGNOSIS — Z7189 Other specified counseling: Secondary | ICD-10-CM | POA: Diagnosis not present

## 2018-12-07 DIAGNOSIS — D3A092 Benign carcinoid tumor of the stomach: Secondary | ICD-10-CM | POA: Diagnosis not present

## 2018-12-07 DIAGNOSIS — D510 Vitamin B12 deficiency anemia due to intrinsic factor deficiency: Secondary | ICD-10-CM | POA: Diagnosis not present

## 2018-12-07 DIAGNOSIS — I10 Essential (primary) hypertension: Secondary | ICD-10-CM | POA: Diagnosis not present

## 2018-12-07 DIAGNOSIS — E785 Hyperlipidemia, unspecified: Secondary | ICD-10-CM | POA: Diagnosis not present

## 2018-12-16 ENCOUNTER — Other Ambulatory Visit: Payer: Self-pay

## 2019-01-09 ENCOUNTER — Emergency Department (HOSPITAL_COMMUNITY): Payer: PPO

## 2019-01-09 ENCOUNTER — Emergency Department (HOSPITAL_COMMUNITY)
Admission: EM | Admit: 2019-01-09 | Discharge: 2019-01-09 | Disposition: A | Discharge status: Home / Self Care | Payer: PPO | Attending: Emergency Medicine | Admitting: Emergency Medicine

## 2019-01-09 ENCOUNTER — Other Ambulatory Visit: Payer: Self-pay

## 2019-01-09 ENCOUNTER — Encounter (HOSPITAL_COMMUNITY): Payer: Self-pay | Admitting: Emergency Medicine

## 2019-01-09 DIAGNOSIS — S299XXA Unspecified injury of thorax, initial encounter: Secondary | ICD-10-CM | POA: Diagnosis not present

## 2019-01-09 DIAGNOSIS — W19XXXA Unspecified fall, initial encounter: Secondary | ICD-10-CM

## 2019-01-09 DIAGNOSIS — Z79899 Other long term (current) drug therapy: Secondary | ICD-10-CM | POA: Diagnosis not present

## 2019-01-09 DIAGNOSIS — R531 Weakness: Secondary | ICD-10-CM | POA: Diagnosis not present

## 2019-01-09 DIAGNOSIS — R42 Dizziness and giddiness: Secondary | ICD-10-CM | POA: Diagnosis present

## 2019-01-09 DIAGNOSIS — I10 Essential (primary) hypertension: Secondary | ICD-10-CM | POA: Insufficient documentation

## 2019-01-09 DIAGNOSIS — R55 Syncope and collapse: Secondary | ICD-10-CM | POA: Diagnosis not present

## 2019-01-09 DIAGNOSIS — R030 Elevated blood-pressure reading, without diagnosis of hypertension: Secondary | ICD-10-CM

## 2019-01-09 LAB — COMPREHENSIVE METABOLIC PANEL
ALT: 20 U/L (ref 0–44)
AST: 49 U/L — ABNORMAL HIGH (ref 15–41)
Albumin: 4.4 g/dL (ref 3.5–5.0)
Alkaline Phosphatase: 78 U/L (ref 38–126)
Anion gap: 12 (ref 5–15)
BUN: 28 mg/dL — ABNORMAL HIGH (ref 8–23)
CO2: 23 mmol/L (ref 22–32)
Calcium: 9.5 mg/dL (ref 8.9–10.3)
Chloride: 105 mmol/L (ref 98–111)
Creatinine, Ser: 1.34 mg/dL — ABNORMAL HIGH (ref 0.44–1.00)
GFR calc Af Amer: 39 mL/min — ABNORMAL LOW (ref >60)
GFR calc non Af Amer: 34 mL/min — ABNORMAL LOW (ref >60)
Glucose, Bld: 108 mg/dL — ABNORMAL HIGH (ref 70–99)
Potassium: 3.8 mmol/L (ref 3.5–5.1)
Sodium: 140 mmol/L (ref 135–145)
Total Bilirubin: 0.9 mg/dL (ref 0.3–1.2)
Total Protein: 8.2 g/dL — ABNORMAL HIGH (ref 6.5–8.1)

## 2019-01-09 LAB — CBC WITH DIFFERENTIAL/PLATELET
Abs Immature Granulocytes: 0.03 10*3/uL (ref 0.00–0.07)
Basophils Absolute: 0 10*3/uL (ref 0.0–0.1)
Basophils Relative: 0 %
Eosinophils Absolute: 0 10*3/uL (ref 0.0–0.5)
Eosinophils Relative: 0 %
HCT: 39.9 % (ref 36.0–46.0)
Hemoglobin: 12.3 g/dL (ref 12.0–15.0)
Immature Granulocytes: 0 %
Lymphocytes Relative: 7 %
Lymphs Abs: 0.5 10*3/uL — ABNORMAL LOW (ref 0.7–4.0)
MCH: 31.1 pg (ref 26.0–34.0)
MCHC: 30.8 g/dL (ref 30.0–36.0)
MCV: 101 fL — ABNORMAL HIGH (ref 80.0–100.0)
Monocytes Absolute: 0.3 10*3/uL (ref 0.1–1.0)
Monocytes Relative: 4 %
Neutro Abs: 5.9 10*3/uL (ref 1.7–7.7)
Neutrophils Relative %: 89 %
Platelets: 125 10*3/uL — ABNORMAL LOW (ref 150–400)
RBC: 3.95 MIL/uL (ref 3.87–5.11)
RDW: 12.3 % (ref 11.5–15.5)
WBC: 6.7 10*3/uL (ref 4.0–10.5)
nRBC: 0 % (ref 0.0–0.2)

## 2019-01-09 LAB — CBG MONITORING, ED: Glucose-Capillary: 91 mg/dL (ref 70–99)

## 2019-01-09 MED ORDER — SODIUM CHLORIDE 0.9 % IV BOLUS
500.0000 mL | Freq: Once | INTRAVENOUS | Status: AC
  Administered 2019-01-09: 500 mL via INTRAVENOUS

## 2019-01-09 NOTE — ED Notes (Signed)
Pt unable to sign due to signature pad note working.

## 2019-01-09 NOTE — Discharge Instructions (Addendum)
Get help right away if you: Develop sudden weakness, especially on one side of your face or body. Have chest pain. Have trouble breathing or shortness of breath. Have problems with your vision. Have trouble talking or swallowing. Have trouble standing or walking. Are light-headed or lose consciousness.

## 2019-01-09 NOTE — ED Triage Notes (Signed)
Pt was with her sister getting ready to go out, stepped on front porch and fell to her knees. Pt denies any complaint at this time.

## 2019-01-09 NOTE — ED Provider Notes (Signed)
[facility] Provider Note   CSN: [id number] Arrival date & time: 01/09/19  1529     History Chief Complaint  Patient presents with  . Fall    [name] is a 85 y.o. female who presents for fall. Patient was going out with her octogenarian sister. Crisoforo Oxford she got out to the porch she got suddenly "dizzy" and fell to her knees. She did not hit her head or loc. Her sister was unable to get her back up off the ground. Her son said she was admitted to the hospital about 4 years ago for syncope. She denies cp, dizziness at rest, cough, fever, melena, medicaition changes.. She denies pain or injury.  HPI     Past Medical History:  Diagnosis Date  . Adult BMI 19-24 kg/sq m 2008 125 lbs  . Anemia    Pernicious 2008-HB 12 MCV 119.5; JAN 2012 HB 13.2 MCV 97.6  . Carcinoid tumor of stomach 2008  . Diverticulosis   . GERD (gastroesophageal reflux disease)   . Hiatal hernia   . HTN (hypertension)   . Hyperlipidemia   . 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 Pernicious anemia   . Pernicious anemia 06/14/2010  . Wrist fracture, left 2008    Patient Active Problem List   Diagnosis Date Noted  . Carotid disease, bilateral ([place]) 04/19/2014  . Syncope 04/18/2014  . AKI (acute kidney injury) ([place]) 04/18/2014  . 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 Pernicious anemia 06/14/2010  . 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 DM 04/03/2009  . HLD (hyperlipidemia) 04/03/2009  . Essential hypertension 04/03/2009  . GERD 04/03/2009  . WEAKNESS 04/03/2009    Past Surgical History:  Procedure Laterality Date  . COLONOSCOPY  2003 LS   DC/[place] Diverticulosis  . EUS  2008 DJ   FNA NO CARCINOID  . EUS  JAN 2009   NL EXAM, NO Bx  . EUS  MAR 2011 WO   CARCINOID  . UPPER GASTROINTESTINAL ENDOSCOPY  APR 2008 SLF   CARCIONOID  . UPPER GASTROINTESTINAL ENDOSCOPY  FEB 2011 SLF   CARCINOID/mild anemia/large hiatal hernia     OB History   No obstetric history on file.      Family History  Problem Relation Age of Onset  . Colon cancer Neg Hx   . Colon polyps Neg Hx     Social History   Tobacco Use  . Smoking status: Never Smoker  . Smokeless tobacco: Never Used  Substance Use Topics  . Alcohol use: No  . Drug use: No    Home Medications Prior to Admission medications   Medication Sig Start Date End Date Taking? Authorizing Provider  acetaminophen (TYLENOL) 500 MG tablet Take 500 mg by mouth every 6 (six) hours as needed. For pain   Yes [provider]  calcium carbonate (TUMS - DOSED IN MG ELEMENTAL CALCIUM) 500 MG chewable tablet Chew 2 tablets by mouth daily as needed for indigestion.    Yes [provider]  donepezil (ARICEPT) 5 MG tablet Take 5 mg by mouth 2 (two) times daily.  01/05/19  Yes [provider]  ENSURE PLUS (ENSURE PLUS) LIQD Take 237 mLs by mouth daily.   Yes [provider]  felodipine (PLENDIL) 5 MG 24 hr tablet Take 5 mg by mouth 2 (two) times daily.    Yes [provider]  simvastatin (ZOCOR) 40 MG tablet Take 40 mg by mouth at bedtime.     Yes [provider]    Allergies    Codeine  Review of Systems   Review of Systems Ten systems reviewed and are negative for acute change, except as noted in the HPI.   Physical Exam Updated Vital Signs BP (!) 183/88 (BP Location: Right Arm)   Pulse 83   Temp (!) 97.1 F (36.2 C) (Oral)   Resp 16   Ht 5\' 4"  (1.626 m)   Wt 50.4 kg   SpO2 95%   BMI 19.07 kg/m   Physical Exam Vitals and nursing note reviewed.  Constitutional:      General: She is not in acute distress.    Appearance: She is well-developed. She is not diaphoretic.  HENT:     Head: Normocephalic and atraumatic.  Eyes:     General: No scleral icterus.    Conjunctiva/sclera: Conjunctivae normal.  Cardiovascular:     Rate and Rhythm: Normal rate and regular rhythm.     Heart sounds: Normal heart sounds. No murmur. No friction rub. No gallop.    Pulmonary:     Effort: Pulmonary effort is normal. No respiratory distress.     Breath sounds: Normal breath sounds.  Abdominal:     General: Bowel sounds are normal. There is no distension.     Palpations: Abdomen is soft. There is no mass.     Tenderness: There is no abdominal tenderness. There is no guarding.  Musculoskeletal:        General: No swelling, tenderness, deformity or signs of injury.     Cervical back: Normal range of motion.  Skin:    General: Skin is warm and dry.  Neurological:     Mental Status: She is alert and oriented to person, place, and time.  Psychiatric:        Behavior: Behavior normal.     ED Results / Procedures / Treatments   Labs (all labs ordered are listed, but only abnormal results are displayed) Labs Reviewed  CBC WITH DIFFERENTIAL/PLATELET  COMPREHENSIVE METABOLIC PANEL  CBG MONITORING, ED    EKG EKG Interpretation  Date/Time:  Saturday January 09 2019 15:37:27 EST Ventricular Rate:  83 PR Interval:    QRS Duration: 77 QT Interval:  397 QTC Calculation: 467 R Axis:   60 Text Interpretation: Sinus rhythm Prolonged PR interval Biatrial enlargement Probable anteroseptal infarct, old ST elevation, consider inferior injury Confirmed by Nat Christen [phone]) on 01/09/2019 4:47:37 PM   Radiology No results found.  Procedures Procedures (including critical care time)  Medications Ordered in ED Medications - No data to display  ED Course  I have reviewed the triage vital signs and the nursing notes.  Pertinent labs & imaging results that were available during my care of the patient were reviewed by me and considered in my medical decision making (see chart for details).    MDM Rules/Calculators/A&P                      [id number] VS: BP (!) 168/84   Pulse 79   Temp (!) 97.1 F (36.2 C) (Rectal)   Resp 19   Ht 5\' 4"  (1.626 m)   Wt 50.4 kg   SpO2 100%   BMI 19.07 kg/m  [id number] is gathered by patient  and son,  emr. DDX:The differential diagnosis of weakness includes but is not limited to neurologic causes (GBS, myasthenia gravis, CVA, MS, ALS, transverse myelitis, spinal cord injury, CVA, botulism, ) and other causes: ACS, Arrhythmia, syncope, orthostatic hypotension, sepsis,  hypoglycemia, electrolyte disturbance, hypothyroidism, respiratory failure, symptomatic anemia, dehydration, heat injury, polypharmacy, malignancy Labs: I reviewed the labs which show CBC shows no elevated white blood cell count.  Chronic macrocytosis.  She does have a history of pernicious anemia.  CMP shows elevated creatinine may be secondary to dehydration. Imaging: I personally reviewed the images (CT head without contrast, 1 view chest x-ray) which show(s) no acute abnormalities on my interpretation EKG: Sinus rhythm at a rate of 39 MDM: 85 year old female who had some weakness earlier today and fell to her knees.  She has no apparent injuries.  Patient actually hypertensive and she forgot to take her amlodipine this morning.  Patient has no evidence of infection.  No elevated white blood cell count.  Her CT scan and chest x-ray are negative.  She has no arrhythmias.  Patient given fluid here in the emergency department.  Suspect some mild dehydration given her elevated creatinine today.  Orthostatic vital signs are negative.  Patient be discharged home to the care of her son. Patient disposition: Discharge Patient condition: Good. The patient appears reasonably screened and/or stabilized for discharge and I doubt any other medical condition or other [facility] requiring further screening, evaluation, or treatment in the ED at this time prior to discharge. I have discussed lab and/or imaging findings with the patient and answered all questions/concerns to the best of my ability. I have discussed return precautions and OP follow up.     Final Clinical Impression(s) / ED Diagnoses Final diagnoses:  None    Rx / DC Orders ED Discharge  Orders    None       Margarita Mail, PA-C 01/09/19 2235    Nat Christen, MD 01/10/19 [phone]

## 2019-03-09 DIAGNOSIS — K589 Irritable bowel syndrome without diarrhea: Secondary | ICD-10-CM | POA: Diagnosis not present

## 2019-03-09 DIAGNOSIS — N390 Urinary tract infection, site not specified: Secondary | ICD-10-CM | POA: Diagnosis not present

## 2019-03-09 DIAGNOSIS — I10 Essential (primary) hypertension: Secondary | ICD-10-CM | POA: Diagnosis not present

## 2019-03-09 DIAGNOSIS — Z7189 Other specified counseling: Secondary | ICD-10-CM | POA: Diagnosis not present

## 2019-03-18 ENCOUNTER — Ambulatory Visit: Payer: PPO | Attending: Internal Medicine

## 2019-03-18 DIAGNOSIS — Z23 Encounter for immunization: Secondary | ICD-10-CM

## 2019-03-18 NOTE — Progress Notes (Signed)
   Covid-19 Vaccination Clinic  Name:  [name]    MRN: [id number] DOB: 07-17-1922  03/18/2019  [name] was observed post Covid-19 immunization for 15 minutes without incidence. She was provided with Vaccine Information Sheet and instruction to access the V-Safe system.   [name] was instructed to call 911 with any severe reactions post vaccine: Marland Kitchen Difficulty breathing  . Swelling of your face and throat  . A fast heartbeat  . A bad rash all over your body  . Dizziness and weakness    Immunizations Administered    Name Date Dose VIS Date Route   Pfizer COVID-19 Vaccine 03/18/2019  9:40 AM 0.3 mL 01/01/2019 Intramuscular   Manufacturer: [employer]   Lot: [id number]   [place]: [id number]

## 2019-04-14 ENCOUNTER — Ambulatory Visit: Payer: PPO | Attending: Internal Medicine

## 2019-04-14 DIAGNOSIS — Z23 Encounter for immunization: Secondary | ICD-10-CM

## 2019-04-14 NOTE — Progress Notes (Signed)
   Covid-19 Vaccination Clinic  Name:  [name]    MRN: [id number] DOB: Jun 01, 1922  04/14/2019  [name] was observed post Covid-19 immunization for 15 minutes without incident. She was provided with Vaccine Information Sheet and instruction to access the V-Safe system.   [name] was instructed to call 911 with any severe reactions post vaccine: Marland Kitchen Difficulty breathing  . Swelling of face and throat  . A fast heartbeat  . A bad rash all over body  . Dizziness and weakness   Immunizations Administered    Name Date Dose VIS Date Route   Pfizer COVID-19 Vaccine 04/14/2019  9:30 AM 0.3 mL 01/01/2019 Intramuscular   Manufacturer: [place]   Lot: [id number]   [place]: [id number]

## 2019-06-08 DIAGNOSIS — I10 Essential (primary) hypertension: Secondary | ICD-10-CM | POA: Diagnosis not present

## 2019-06-08 DIAGNOSIS — E785 Hyperlipidemia, unspecified: Secondary | ICD-10-CM | POA: Diagnosis not present

## 2019-06-08 DIAGNOSIS — D3A092 Benign carcinoid tumor of the stomach: Secondary | ICD-10-CM | POA: Diagnosis not present

## 2019-08-17 ENCOUNTER — Other Ambulatory Visit: Payer: Self-pay

## 2019-08-26 ENCOUNTER — Other Ambulatory Visit: Payer: Self-pay

## 2019-08-26 ENCOUNTER — Other Ambulatory Visit: Payer: PPO

## 2019-08-26 DIAGNOSIS — Z20822 Contact with and (suspected) exposure to covid-19: Secondary | ICD-10-CM | POA: Diagnosis not present

## 2019-08-27 ENCOUNTER — Telehealth: Payer: Self-pay | Admitting: Family Medicine

## 2019-08-27 LAB — SARS-COV-2, NAA 2 DAY TAT

## 2019-08-27 LAB — NOVEL CORONAVIRUS, NAA: SARS-CoV-2, NAA: NOT DETECTED

## 2019-08-27 NOTE — Telephone Encounter (Signed)
Negative COVID results given. Patient results "NOT Detected." Caller expressed understanding. ° °

## 2019-09-13 DIAGNOSIS — D510 Vitamin B12 deficiency anemia due to intrinsic factor deficiency: Secondary | ICD-10-CM | POA: Diagnosis not present

## 2019-09-13 DIAGNOSIS — I10 Essential (primary) hypertension: Secondary | ICD-10-CM | POA: Diagnosis not present

## 2019-09-13 DIAGNOSIS — E785 Hyperlipidemia, unspecified: Secondary | ICD-10-CM | POA: Diagnosis not present

## 2019-09-13 DIAGNOSIS — Z0000 Encounter for general adult medical examination without abnormal findings: Secondary | ICD-10-CM | POA: Diagnosis not present

## 2019-09-24 ENCOUNTER — Observation Stay (HOSPITAL_COMMUNITY)
Admission: EM | Admit: 2019-09-24 | Discharge: 2019-09-25 | Disposition: A | Discharge status: Home / Self Care | Payer: PPO | Attending: Emergency Medicine | Admitting: Emergency Medicine

## 2019-09-24 ENCOUNTER — Emergency Department (HOSPITAL_COMMUNITY): Payer: PPO

## 2019-09-24 ENCOUNTER — Observation Stay (HOSPITAL_COMMUNITY): Payer: PPO

## 2019-09-24 ENCOUNTER — Encounter (HOSPITAL_COMMUNITY): Payer: Self-pay

## 2019-09-24 ENCOUNTER — Other Ambulatory Visit: Payer: Self-pay

## 2019-09-24 DIAGNOSIS — R55 Syncope and collapse: Principal | ICD-10-CM | POA: Insufficient documentation

## 2019-09-24 DIAGNOSIS — E782 Mixed hyperlipidemia: Secondary | ICD-10-CM | POA: Diagnosis present

## 2019-09-24 DIAGNOSIS — E785 Hyperlipidemia, unspecified: Secondary | ICD-10-CM | POA: Diagnosis not present

## 2019-09-24 DIAGNOSIS — R918 Other nonspecific abnormal finding of lung field: Secondary | ICD-10-CM | POA: Insufficient documentation

## 2019-09-24 DIAGNOSIS — I6782 Cerebral ischemia: Secondary | ICD-10-CM | POA: Diagnosis not present

## 2019-09-24 DIAGNOSIS — I700 Atherosclerosis of aorta: Secondary | ICD-10-CM | POA: Diagnosis not present

## 2019-09-24 DIAGNOSIS — I10 Essential (primary) hypertension: Secondary | ICD-10-CM | POA: Diagnosis not present

## 2019-09-24 DIAGNOSIS — K219 Gastro-esophageal reflux disease without esophagitis: Secondary | ICD-10-CM | POA: Insufficient documentation

## 2019-09-24 DIAGNOSIS — J439 Emphysema, unspecified: Secondary | ICD-10-CM | POA: Diagnosis not present

## 2019-09-24 DIAGNOSIS — I672 Cerebral atherosclerosis: Secondary | ICD-10-CM | POA: Diagnosis not present

## 2019-09-24 DIAGNOSIS — Z20822 Contact with and (suspected) exposure to covid-19: Secondary | ICD-10-CM | POA: Insufficient documentation

## 2019-09-24 DIAGNOSIS — J449 Chronic obstructive pulmonary disease, unspecified: Secondary | ICD-10-CM | POA: Diagnosis not present

## 2019-09-24 DIAGNOSIS — R531 Weakness: Secondary | ICD-10-CM | POA: Diagnosis not present

## 2019-09-24 DIAGNOSIS — I739 Peripheral vascular disease, unspecified: Secondary | ICD-10-CM | POA: Diagnosis not present

## 2019-09-24 DIAGNOSIS — I6529 Occlusion and stenosis of unspecified carotid artery: Secondary | ICD-10-CM | POA: Diagnosis not present

## 2019-09-24 DIAGNOSIS — Z79899 Other long term (current) drug therapy: Secondary | ICD-10-CM | POA: Diagnosis not present

## 2019-09-24 DIAGNOSIS — R4020 Unspecified coma: Secondary | ICD-10-CM | POA: Diagnosis not present

## 2019-09-24 DIAGNOSIS — I779 Disorder of arteries and arterioles, unspecified: Secondary | ICD-10-CM | POA: Diagnosis not present

## 2019-09-24 DIAGNOSIS — K449 Diaphragmatic hernia without obstruction or gangrene: Secondary | ICD-10-CM | POA: Diagnosis not present

## 2019-09-24 DIAGNOSIS — I6389 Other cerebral infarction: Secondary | ICD-10-CM | POA: Diagnosis not present

## 2019-09-24 DIAGNOSIS — I998 Other disorder of circulatory system: Secondary | ICD-10-CM | POA: Diagnosis not present

## 2019-09-24 DIAGNOSIS — R7989 Other specified abnormal findings of blood chemistry: Secondary | ICD-10-CM | POA: Diagnosis not present

## 2019-09-24 LAB — HEPATIC FUNCTION PANEL
ALT: 11 U/L (ref 0–44)
AST: 25 U/L (ref 15–41)
Albumin: 4.2 g/dL (ref 3.5–5.0)
Alkaline Phosphatase: 83 U/L (ref 38–126)
Bilirubin, Direct: 0.1 mg/dL (ref 0.0–0.2)
Indirect Bilirubin: 0.5 mg/dL (ref 0.3–0.9)
Total Bilirubin: 0.6 mg/dL (ref 0.3–1.2)
Total Protein: 7.9 g/dL (ref 6.5–8.1)

## 2019-09-24 LAB — URINALYSIS, ROUTINE W REFLEX MICROSCOPIC
Bilirubin Urine: NEGATIVE
Glucose, UA: NEGATIVE mg/dL
Ketones, ur: NEGATIVE mg/dL
Nitrite: NEGATIVE
Protein, ur: NEGATIVE mg/dL
Specific Gravity, Urine: 1.008 (ref 1.005–1.030)
pH: 7 (ref 5.0–8.0)

## 2019-09-24 LAB — FOLATE: Folate: 12.2 ng/mL (ref >5.9)

## 2019-09-24 LAB — BASIC METABOLIC PANEL
Anion gap: 12 (ref 5–15)
BUN: 18 mg/dL (ref 8–23)
CO2: 22 mmol/L (ref 22–32)
Calcium: 8.8 mg/dL — ABNORMAL LOW (ref 8.9–10.3)
Chloride: 106 mmol/L (ref 98–111)
Creatinine, Ser: 1 mg/dL (ref 0.44–1.00)
GFR calc Af Amer: 55 mL/min — ABNORMAL LOW (ref >60)
GFR calc non Af Amer: 48 mL/min — ABNORMAL LOW (ref >60)
Glucose, Bld: 132 mg/dL — ABNORMAL HIGH (ref 70–99)
Potassium: 3.7 mmol/L (ref 3.5–5.1)
Sodium: 140 mmol/L (ref 135–145)

## 2019-09-24 LAB — TROPONIN I (HIGH SENSITIVITY)
Troponin I (High Sensitivity): 11 ng/L (ref <18)
Troponin I (High Sensitivity): 11 ng/L (ref <18)

## 2019-09-24 LAB — CBC
HCT: 34.2 % — ABNORMAL LOW (ref 36.0–46.0)
Hemoglobin: 10.2 g/dL — ABNORMAL LOW (ref 12.0–15.0)
MCH: 28.9 pg (ref 26.0–34.0)
MCHC: 29.8 g/dL — ABNORMAL LOW (ref 30.0–36.0)
MCV: 96.9 fL (ref 80.0–100.0)
Platelets: 179 10*3/uL (ref 150–400)
RBC: 3.53 MIL/uL — ABNORMAL LOW (ref 3.87–5.11)
RDW: 13.2 % (ref 11.5–15.5)
WBC: 6.3 10*3/uL (ref 4.0–10.5)
nRBC: 0 % (ref 0.0–0.2)

## 2019-09-24 LAB — SARS CORONAVIRUS 2 BY RT PCR (HOSPITAL ORDER, PERFORMED IN ~~LOC~~ HOSPITAL LAB): SARS Coronavirus 2: NEGATIVE

## 2019-09-24 LAB — CBG MONITORING, ED: Glucose-Capillary: 105 mg/dL — ABNORMAL HIGH (ref 70–99)

## 2019-09-24 LAB — TSH: TSH: 11.872 u[IU]/mL — ABNORMAL HIGH (ref 0.350–4.500)

## 2019-09-24 LAB — VITAMIN B12: Vitamin B-12: 194 pg/mL (ref 180–914)

## 2019-09-24 MED ORDER — SIMVASTATIN 10 MG PO TABS: 40.0000 mg | ORAL_TABLET | Freq: Every day | ORAL | Status: DC

## 2019-09-24 MED ORDER — FELODIPINE ER 5 MG PO TB24
5.0000 mg | ORAL_TABLET | Freq: Two times a day (BID) | ORAL | Status: DC
  Administered 2019-09-25: 5 mg via ORAL
  Filled 2019-09-24 (×6): qty 1

## 2019-09-24 MED ORDER — SODIUM CHLORIDE 0.9 % IV BOLUS
500.0000 mL | Freq: Once | INTRAVENOUS | Status: AC
  Administered 2019-09-24: 500 mL via INTRAVENOUS

## 2019-09-24 MED ORDER — HYDRALAZINE HCL 20 MG/ML IJ SOLN
5.0000 mg | INTRAMUSCULAR | Status: DC | PRN
  Administered 2019-09-25: 5 mg via INTRAVENOUS
  Filled 2019-09-24: qty 1

## 2019-09-24 MED ORDER — FAMOTIDINE 20 MG PO TABS
20.0000 mg | ORAL_TABLET | Freq: Every day | ORAL | Status: DC
  Administered 2019-09-25: 20 mg via ORAL
  Filled 2019-09-24: qty 1

## 2019-09-24 MED ORDER — ENSURE ENLIVE PO LIQD
237.0000 mL | Freq: Every day | ORAL | Status: DC
  Filled 2019-09-24 (×3): qty 237

## 2019-09-24 MED ORDER — SODIUM CHLORIDE 0.9% FLUSH
3.0000 mL | Freq: Two times a day (BID) | INTRAVENOUS | Status: DC
  Administered 2019-09-25: 3 mL via INTRAVENOUS

## 2019-09-24 MED ORDER — HEPARIN SODIUM (PORCINE) 5000 UNIT/ML IJ SOLN
5000.0000 [IU] | Freq: Three times a day (TID) | INTRAMUSCULAR | Status: DC
  Administered 2019-09-25: 5000 [IU] via SUBCUTANEOUS
  Filled 2019-09-24: qty 1

## 2019-09-24 MED ORDER — DONEPEZIL HCL 5 MG PO TABS
5.0000 mg | ORAL_TABLET | Freq: Two times a day (BID) | ORAL | Status: DC
  Administered 2019-09-25: 5 mg via ORAL
  Filled 2019-09-24 (×5): qty 1

## 2019-09-24 MED ORDER — SODIUM CHLORIDE 0.9 % IV SOLN: INTRAVENOUS | Status: AC

## 2019-09-24 MED ORDER — CALCIUM CARBONATE ANTACID 500 MG PO CHEW: 2.0000 | CHEWABLE_TABLET | Freq: Every day | ORAL | Status: DC | PRN

## 2019-09-24 NOTE — ED Notes (Signed)
Pt asleep.

## 2019-09-24 NOTE — ED Notes (Signed)
Pt placed on bedpan and back off, pt stating she needs to have a BM but no results; pt gone over to MRI

## 2019-09-24 NOTE — ED Notes (Signed)
Dr. Johnette Abraham, hospitalist in with pt at this time

## 2019-09-24 NOTE — ED Triage Notes (Signed)
Son states that he was called because pt passed out in her car, states that she might be dehydrated, states that she has been seen several times in the past with not findings/

## 2019-09-24 NOTE — ED Notes (Signed)
Pt assisted on bedpan and off, CT notified pt can come over

## 2019-09-24 NOTE — ED Notes (Signed)
Pt assisted to bedpan and off without complications

## 2019-09-24 NOTE — ED Notes (Signed)
Pt gown and bed linens changed due to faulty purewick catheter

## 2019-09-24 NOTE — ED Provider Notes (Signed)
[facility] EMERGENCY DEPARTMENT Provider Note   CSN: [id number] Arrival date & time: 09/24/19  1328     History Chief Complaint  Patient presents with   Weakness    [name] is a 86 y.o. female.  Patient supposedly passed out in the car.  She is with her sister.  When the paramedics arrived she a little bit confused and refused transport.  Patient has no complaints now  The history is provided by the patient and medical records. No language interpreter was used.  Weakness Severity:  Moderate Onset quality:  Sudden Timing:  Intermittent Progression:  Waxing and waning Chronicity:  New Context: not alcohol use   Relieved by:  Nothing Worsened by:  Nothing Ineffective treatments:  None tried Associated symptoms: no abdominal pain, no chest pain, no cough, no diarrhea, no frequency, no headaches and no seizures   Risk factors: no anemia        Past Medical History:  Diagnosis Date   Adult BMI 19-24 kg/sq m 2008 125 lbs   Anemia    Pernicious 2008-HB 12 MCV 119.5; JAN 2012 HB 13.2 MCV 97.6   Carcinoid tumor of stomach 2008   Diverticulosis    GERD (gastroesophageal reflux disease)    Hiatal hernia    HTN (hypertension)    Hyperlipidemia    Mild memory loss 02/15/2011   Pernicious anemia    Pernicious anemia 06/14/2010   Wrist fracture, left 2008    Patient Active Problem List   Diagnosis Date Noted   Carotid disease, bilateral ([place]) 04/19/2014   Syncope 04/18/2014   AKI (acute kidney injury) ([place]) 04/18/2014   Mild memory loss 02/15/2011   Pernicious anemia 06/14/2010   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     Family History  Problem Relation Age of Onset   Colon cancer Neg Hx    Colon polyps Neg Hx     Social History   Tobacco Use   Smoking status: Never Smoker   Smokeless tobacco: Never Used  Vaping Use   Vaping Use: Never used  Substance Use Topics   Alcohol use: No   Drug use: No    Home Medications Prior to Admission medications   Medication Sig Start Date End Date Taking? Authorizing Provider  acetaminophen (TYLENOL) 500 MG tablet Take 500 mg by mouth every 6 (six) hours as needed. For pain    [provider]  calcium carbonate (TUMS - DOSED IN MG ELEMENTAL CALCIUM) 500 MG chewable tablet Chew 2 tablets by mouth daily as needed for indigestion.     [provider]  donepezil (ARICEPT) 5 MG tablet Take 5 mg by mouth 2 (two) times daily.  01/05/19   [provider]  ENSURE PLUS (ENSURE PLUS) LIQD Take 237 mLs by mouth daily.    [provider]  felodipine (PLENDIL) 5 MG 24 hr tablet Take 5 mg by mouth 2 (two) times daily.     [provider]  simvastatin (ZOCOR) 40 MG tablet Take 40 mg by mouth at bedtime.      [provider]    Allergies    Codeine  Review of Systems   Review of Systems  Constitutional: Negative for appetite change and fatigue.  HENT: Negative for congestion, ear discharge and sinus pressure.   Eyes: Negative for discharge.  Respiratory: Negative for cough.   Cardiovascular: Negative for chest pain.  Gastrointestinal: Negative for abdominal pain and diarrhea.  Genitourinary: Negative for frequency and hematuria.  Musculoskeletal: Negative for back pain.  Skin: Negative for rash.  Neurological: Positive for weakness. Negative for seizures and headaches.    Psychiatric/Behavioral: Negative for hallucinations.    Physical Exam Updated Vital Signs BP (!) 142/63 (BP Location: Right Arm)    Pulse 75    Temp 97.6 F (36.4 C) (Oral)    Resp 18    Ht 5\' 2"  (1.575 m)    Wt 45.4 kg    SpO2 100%    BMI 18.29 kg/m   Physical Exam Vitals and nursing note reviewed.  Constitutional:      Appearance: She is well-developed.  HENT:     Head: Normocephalic.     Nose: Nose normal.  Eyes:     General: No scleral icterus.    Conjunctiva/sclera: Conjunctivae normal.  Neck:     Thyroid: No thyromegaly.  Cardiovascular:     Rate and Rhythm: Normal rate and regular rhythm.     Heart sounds: No murmur heard.  No friction rub. No gallop.   Pulmonary:     Breath sounds: No stridor. No wheezing or rales.  Chest:     Chest wall: No tenderness.  Abdominal:     General: There is no distension.     Tenderness: There is no abdominal tenderness. There is no rebound.  Musculoskeletal:        General: Normal range of motion.     Cervical back: Neck supple.  Lymphadenopathy:     Cervical: No cervical adenopathy.  Skin:    Findings: No erythema or rash.  Neurological:     Mental Status: She is alert and oriented to person, place, and time.     Motor: No abnormal muscle tone.     Coordination: Coordination normal.  Psychiatric:        Behavior: Behavior normal.     ED Results / Procedures / Treatments   Labs (all labs ordered are listed, but only abnormal results are displayed) Labs Reviewed  CBC - Abnormal; Notable for the following components:      Result Value   RBC 3.53 (*)    Hemoglobin 10.2 (*)    HCT 34.2 (*)    MCHC 29.8 (*)    All other components within normal limits  CBG MONITORING, ED - Abnormal; Notable for the following components:   Glucose-Capillary 105 (*)    All other components within normal limits  SARS CORONAVIRUS 2 BY RT PCR (HOSPITAL ORDER, [place] [facility])  BASIC METABOLIC PANEL  URINALYSIS,  ROUTINE W REFLEX MICROSCOPIC  HEPATIC FUNCTION PANEL  TROPONIN I (HIGH SENSITIVITY)    EKG None  Radiology No results found.  Procedures Procedures (including critical care time)  Medications Ordered in ED Medications  sodium chloride 0.9 % bolus 500 mL (has no administration in time range)  ED Course  I have reviewed the triage vital signs and the nursing notes.  Pertinent labs & imaging results that were available during my care of the patient were reviewed by me and considered in my medical decision making (see chart for details).    MDM Rules/Calculators/A&P                          Patient with syncope and weakness.  Work-up still pending Final Clinical Impression(s) / ED Diagnoses Final diagnoses:  None    Rx / DC Orders ED Discharge Orders    None       Milton Ferguson, MD 09/28/19 1052

## 2019-09-24 NOTE — H&P (Signed)
TRH H&P   Patient Demographics:    [name], is a 86 y.o. female  MRN: [id number]   DOB - [birth year]  Admit Date - 09/24/2019  Outpatient Primary MD for the patient is Iona Beard, MD  Referring MD/NP/PA: Dr Rogene Houston  Patient coming from: Home  Chief Complaint  Patient presents with  . Weakness      HPI:    [name]  is a 86 y.o. female, with past medical history of of hypertension, she presents to ED secondary to syncope, history was obtained from patient, who is awake alert x3, as well was obtained with son and sister via phone, apparently patient went shopping with her sister, to the grocery store, and when she was sitting behind steering well, her sister noted her to be low to respond, lethargic, and slumped into her chair, sister did not let her to drive, and called the son, who came and found his mom unresponsive, so he brought her to ED, patient is currently awake alert x3, appropriate, not recall these events, was no seizure activities, patient herself denies any fever, chills, chest pain or shortness of breath, she had similar presentation in the past for syncope in 2017, and 2020, she denies dizziness, lightheadedness, any focal deficits or slurred speech. - in ED no significant labs abnormalities, UA was positive, but she denies any urinary symptoms, CT head with no acute findings,abnormal UA, but she denies any symptoms, [facility] consulted for further work-up.    Review of systems:    In addition to the HPI above,  No Fever-chills, No Headache, No changes with Vision or hearing, she had an episode of syncope No problems swallowing food or Liquids, No Chest pain, Cough or Shortness of Breath, No Abdominal pain, No Nausea or Vommitting, Bowel movements are regular, No Blood in stool or Urine, No dysuria, No new skin rashes or bruises, No new joints  pains-aches,  No new weakness, tingling, numbness in any extremity, No recent weight gain or loss, No polyuria, polydypsia or polyphagia, No significant Mental Stressors.  A full 10 point Review of Systems was done, except as stated above, all other Review of Systems were negative.   With Past History of the following :    Past Medical History:  Diagnosis Date  . Adult BMI 19-24 kg/sq m 2008 125 lbs  . Anemia    Pernicious 2008-HB 12 MCV 119.5; JAN 2012 HB 13.2 MCV 97.6  . Carcinoid tumor of stomach 2008  . Diverticulosis   . GERD (gastroesophageal reflux disease)   . Hiatal hernia   . HTN (hypertension)   . Hyperlipidemia   . 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 Pernicious anemia   . Pernicious anemia 06/14/2010  . Wrist fracture, left 2008      Past Surgical History:  Procedure Laterality Date  . COLONOSCOPY  2003 LS   DC/[place] Diverticulosis  .  EUS  2008 DJ   FNA NO CARCINOID  . EUS  JAN 2009   NL EXAM, NO Bx  . EUS  MAR 2011 WO   CARCINOID  . UPPER GASTROINTESTINAL ENDOSCOPY  APR 2008 [facility]   CARCIONOID  . UPPER GASTROINTESTINAL ENDOSCOPY  FEB 2011 [facility]   CARCINOID/mild anemia/large hiatal hernia      Social History:     Social History   Tobacco Use  . Smoking status: Never Smoker  . Smokeless tobacco: Never Used  Substance Use Topics  . Alcohol use: No      Family History :     Family History  Problem Relation Age of Onset  . Colon cancer Neg Hx   . Colon polyps Neg Hx      Home Medications:   Prior to Admission medications   Medication Sig Start Date End Date Taking? Authorizing Provider  calcium carbonate (TUMS - DOSED IN MG ELEMENTAL CALCIUM) 500 MG chewable tablet Chew 2 tablets by mouth daily as needed for indigestion.    Yes [provider]  donepezil (ARICEPT) 5 MG tablet Take 5 mg by mouth 2 (two) times daily.  01/05/19  Yes [provider]  famotidine (PEPCID) 20 MG tablet Take 20 mg by mouth daily. 07/12/19  Yes [provider]  felodipine (PLENDIL) 5 MG 24 hr tablet Take 5 mg by mouth 2 (two) times daily.    Yes [provider]  simvastatin (ZOCOR) 40 MG tablet Take 40 mg by mouth at bedtime.     Yes [provider]  acetaminophen (TYLENOL) 500 MG tablet Take 500 mg by mouth every 6 (six) hours as needed. For pain    [provider]  ENSURE PLUS (ENSURE PLUS) LIQD Take 237 mLs by mouth daily.    [provider]     Allergies:     Allergies  Allergen Reactions  . Codeine      Physical Exam:   Vitals  Blood pressure (!) 142/63, pulse 75, temperature 97.6 F (36.4 C), temperature source Oral, resp. rate 18, height 5\' 2"  (1.575 m), weight 45.4 kg, SpO2 100 %.   1. General frail elderly female, laying in bed, in no apparent distress  2. Normal affect and insight, Not Suicidal or Homicidal, she is slow to respond, but she is appropriate, awake Alert, Oriented X 3.  3. No F.N deficits, ALL C.Nerves Intact, Strength 5/5 all 4 extremities, Sensation intact all 4 extremities, Plantars down going.  4. Ears and Eyes appear Normal, Conjunctivae clear, PERRLA. Moist Oral Mucosa.  5. Supple Neck, No JVD, No cervical lymphadenopathy appriciated, No Carotid Bruits.  6. Symmetrical Chest wall movement, Good air movement bilaterally, CTAB.  7. RRR, No Gallops, Rubs or Murmurs, No Parasternal Heave.  8. Positive Bowel Sounds, Abdomen Soft, No tenderness, No organomegaly appriciated,No rebound -guarding or rigidity.  9.  No Cyanosis, Normal Skin Turgor, No Skin Rash or Bruise.  10. Good muscle tone,  joints appear normal , no effusions, Normal ROM.  11. No Palpable Lymph Nodes in Neck or Axillae     Data Review:    CBC Recent Labs  Lab 09/24/19 1418  WBC 6.3  HGB 10.2*  HCT 34.2*  PLT 179  MCV 96.9  MCH 28.9  MCHC 29.8*  RDW 13.2    ------------------------------------------------------------------------------------------------------------------  Chemistries  Recent Labs  Lab 09/24/19 1418  NA 140  K 3.7  CL 106  CO2 22  GLUCOSE 132*  BUN 18  CREATININE 1.00  CALCIUM 8.8*  AST 25  ALT 11  ALKPHOS 83  BILITOT 0.6   ------------------------------------------------------------------------------------------------------------------ estimated creatinine clearance is 24.1 mL/min (by C-G formula based on SCr of 1 mg/dL). ------------------------------------------------------------------------------------------------------------------ No results for input(s): TSH, T4TOTAL, T3FREE, THYROIDAB in the last 72 hours.  Invalid input(s): FREET3  Coagulation profile No results for input(s): INR, PROTIME in the last 168 hours. ------------------------------------------------------------------------------------------------------------------- No results for input(s): DDIMER in the last 72 hours. -------------------------------------------------------------------------------------------------------------------  Cardiac Enzymes No results for input(s): CKMB, TROPONINI, MYOGLOBIN in the last 168 hours.  Invalid input(s): CK ------------------------------------------------------------------------------------------------------------------ No results found for: BNP   ---------------------------------------------------------------------------------------------------------------  Urinalysis    Component Value Date/Time   COLORURINE YELLOW 09/24/2019 1530   APPEARANCEUR HAZY (A) 09/24/2019 1530   LABSPEC 1.008 09/24/2019 1530   PHURINE 7.0 09/24/2019 1530   GLUCOSEU NEGATIVE 09/24/2019 1530   HGBUR SMALL (A) 09/24/2019 1530   BILIRUBINUR NEGATIVE 09/24/2019 1530   KETONESUR NEGATIVE 09/24/2019 1530   PROTEINUR NEGATIVE 09/24/2019 1530   UROBILINOGEN 0.2 04/18/2014 1420   NITRITE NEGATIVE 09/24/2019 1530    LEUKOCYTESUR MODERATE (A) 09/24/2019 1530    ----------------------------------------------------------------------------------------------------------------   Imaging Results:    CT Head Wo Contrast  Result Date: 09/24/2019 CLINICAL DATA:  Syncopal episode, weakness for several days EXAM: CT HEAD WITHOUT CONTRAST TECHNIQUE: Contiguous axial images were obtained from the base of the skull through the vertex without intravenous contrast. COMPARISON:  CT 01/09/2019 FINDINGS: Brain: No evidence of acute infarction, hemorrhage, hydrocephalus, extra-axial collection or mass lesion/mass effect. Symmetric prominence of the ventricles, cisterns and sulci compatible with parenchymal volume loss. Patchy areas of white matter hypoattenuation are most compatible with chronic microvascular angiopathy. Vascular: Atherosclerotic calcification of the carotid siphons and intradural vertebral arteries. No hyperdense vessel. Skull: No calvarial fracture or suspicious osseous lesion. No scalp swelling or hematoma. Sinuses/Orbits: Paranasal sinuses and mastoid air cells are predominantly clear. Included orbital structures are unremarkable. Other: None. IMPRESSION: 1. No acute intracranial findings. 2. Chronic microvascular angiopathy and parenchymal volume loss. 3. Intracranial atherosclerosis. Electronically Signed   By: Lovena Le M.D.   On: 09/24/2019 15:56   DG Chest Port 1 View  Result Date: 09/24/2019 CLINICAL DATA:  Weakness, syncope EXAM: PORTABLE CHEST 1 VIEW COMPARISON:  01/09/2019 FINDINGS: The heart size and mediastinal contours are stable with large hiatal hernia. Hyperinflated lungs with chronic emphysematous changes. No focal airspace consolidation, pleural effusion, or pneumothorax. Degenerative changes of the shoulders. IMPRESSION: 1. No acute cardiopulmonary findings. 2. COPD. 3. Large hiatal hernia. Electronically Signed   By: Davina Poke D.O.   On: 09/24/2019 14:44    My personal review of EKG:  Rhythm NSR, Rate  75 /min, QTc 489 , no Acute ST changes   Assessment & Plan:    Active Problems:   HLD (hyperlipidemia)   Essential hypertension   GERD   Syncope   Carotid disease, bilateral ([place])    Syncope -Patient presents with syncope, she had similar admission in 2017 for syncope, another ED visit in 2020 for syncope as well work-up then was uneventful. -She will be admitted for syncope work-up, she will be kept on gentle hydration, I will obtain 2D echo as well, monitor her on telemetry, I will obtain MRI of the brain, will check Q76, TSH and folic acid. -We will consult PT/OT.  Hyperlipidemia -Continue with Zocor  GERD -Continue with PPI  Hypertension -Continue with felodipine, blood pressure is mildly elevated, will add as needed hydralazine as well  Abnormal UA, -She denies  any urinary symptoms, will hold on antibiotics, will follow on urine cultures culture  DVT Prophylaxis Heparin   AM Labs Ordered, also please review Full Orders  Family Communication: Admission, patients condition and plan of care including tests being ordered have been discussed with the patient and son by phone who indicate understanding and agree with the plan and Code Status.  Code Status full code, confirmed by son  Likely DC to home  Condition GUARDED    Consults called: None  Admission status: Observation  Time spent in minutes : 55 minutes   Phillips Climes M.D on 09/24/2019 at [place] PM   [facility] - Office  [phone]

## 2019-09-24 NOTE — ED Triage Notes (Signed)
Pt to er, pt states that she feels generally weak.  Pt denies pain.  Pt awake and oriented pt states that she has had the weakness for the past several days.

## 2019-09-24 NOTE — ED Provider Notes (Signed)
Patient's son is here with her.  Work-up for the syncopal episode that occurred while she was driving but she was in the car was stopped at the time.  Without any significant findings.  Other than borderline urinalysis or urinary tract infection.  The patient has no dysuria or urinary tract symptoms.  Chart review shows that in 2017 she was admitted for syncope.  And then also was evaluated in December 2020 for a fall.  With a stated there was no loss of consciousness at that time.  Patient was not admitted at that time.  I feel that she probably needs admission with cardiac monitoring.  Will contact hospitalist for admission.  Covid testing was negative.   Fredia Sorrow, MD 09/24/19 [phone]

## 2019-09-25 ENCOUNTER — Encounter (HOSPITAL_COMMUNITY): Payer: Self-pay | Admitting: *Deleted

## 2019-09-25 ENCOUNTER — Emergency Department (HOSPITAL_COMMUNITY): Payer: PPO

## 2019-09-25 ENCOUNTER — Observation Stay (HOSPITAL_BASED_OUTPATIENT_CLINIC_OR_DEPARTMENT_OTHER): Payer: PPO

## 2019-09-25 ENCOUNTER — Emergency Department (HOSPITAL_COMMUNITY): Admission: EM | Admit: 2019-09-25 | Discharge: 2019-09-25 | Discharge status: Absent without leave | Payer: PPO

## 2019-09-25 ENCOUNTER — Emergency Department (HOSPITAL_COMMUNITY)
Admission: EM | Admit: 2019-09-25 | Discharge: 2019-09-26 | Disposition: A | Discharge status: Home / Self Care | Payer: PPO | Source: Home / Self Care | Attending: Emergency Medicine | Admitting: Emergency Medicine

## 2019-09-25 ENCOUNTER — Other Ambulatory Visit: Payer: Self-pay

## 2019-09-25 DIAGNOSIS — I779 Disorder of arteries and arterioles, unspecified: Secondary | ICD-10-CM | POA: Diagnosis not present

## 2019-09-25 DIAGNOSIS — R55 Syncope and collapse: Secondary | ICD-10-CM | POA: Insufficient documentation

## 2019-09-25 DIAGNOSIS — I361 Nonrheumatic tricuspid (valve) insufficiency: Secondary | ICD-10-CM

## 2019-09-25 DIAGNOSIS — R918 Other nonspecific abnormal finding of lung field: Secondary | ICD-10-CM | POA: Insufficient documentation

## 2019-09-25 DIAGNOSIS — K219 Gastro-esophageal reflux disease without esophagitis: Secondary | ICD-10-CM

## 2019-09-25 DIAGNOSIS — I10 Essential (primary) hypertension: Secondary | ICD-10-CM | POA: Insufficient documentation

## 2019-09-25 LAB — CBC
HCT: 32.8 % — ABNORMAL LOW (ref 36.0–46.0)
HCT: 35.5 % — ABNORMAL LOW (ref 36.0–46.0)
Hemoglobin: 9.9 g/dL — ABNORMAL LOW (ref 12.0–15.0)
Hemoglobin: 10.5 g/dL — ABNORMAL LOW (ref 12.0–15.0)
MCH: 28.7 pg (ref 26.0–34.0)
MCH: 28.2 pg (ref 26.0–34.0)
MCHC: 30.2 g/dL (ref 30.0–36.0)
MCHC: 29.6 g/dL — ABNORMAL LOW (ref 30.0–36.0)
MCV: 95.1 fL (ref 80.0–100.0)
MCV: 95.4 fL (ref 80.0–100.0)
Platelets: 223 10*3/uL (ref 150–400)
Platelets: 248 10*3/uL (ref 150–400)
RBC: 3.45 MIL/uL — ABNORMAL LOW (ref 3.87–5.11)
RBC: 3.72 MIL/uL — ABNORMAL LOW (ref 3.87–5.11)
RDW: 13.3 % (ref 11.5–15.5)
RDW: 13.3 % (ref 11.5–15.5)
WBC: 6.8 10*3/uL (ref 4.0–10.5)
WBC: 7.4 10*3/uL (ref 4.0–10.5)
nRBC: 0 % (ref 0.0–0.2)
nRBC: 0 % (ref 0.0–0.2)

## 2019-09-25 LAB — COMPREHENSIVE METABOLIC PANEL
ALT: 11 U/L (ref 0–44)
AST: 22 U/L (ref 15–41)
Albumin: 3.7 g/dL (ref 3.5–5.0)
Alkaline Phosphatase: 71 U/L (ref 38–126)
Anion gap: 11 (ref 5–15)
BUN: 15 mg/dL (ref 8–23)
CO2: 24 mmol/L (ref 22–32)
Calcium: 8.9 mg/dL (ref 8.9–10.3)
Chloride: 105 mmol/L (ref 98–111)
Creatinine, Ser: 0.9 mg/dL (ref 0.44–1.00)
GFR calc Af Amer: >60 mL/min (ref >60)
GFR calc non Af Amer: 54 mL/min — ABNORMAL LOW (ref >60)
Glucose, Bld: 117 mg/dL — ABNORMAL HIGH (ref 70–99)
Potassium: 3.8 mmol/L (ref 3.5–5.1)
Sodium: 140 mmol/L (ref 135–145)
Total Bilirubin: 0.8 mg/dL (ref 0.3–1.2)
Total Protein: 7.1 g/dL (ref 6.5–8.1)

## 2019-09-25 LAB — ECHOCARDIOGRAM COMPLETE
Area-P 1/2: 6.07 cm2
Height: 62 in
S' Lateral: 2.17 cm
Weight: 1600 oz

## 2019-09-25 LAB — BASIC METABOLIC PANEL
Anion gap: 13 (ref 5–15)
BUN: 20 mg/dL (ref 8–23)
CO2: 23 mmol/L (ref 22–32)
Calcium: 9.7 mg/dL (ref 8.9–10.3)
Chloride: 104 mmol/L (ref 98–111)
Creatinine, Ser: 1.27 mg/dL — ABNORMAL HIGH (ref 0.44–1.00)
GFR calc Af Amer: 42 mL/min — ABNORMAL LOW (ref >60)
GFR calc non Af Amer: 36 mL/min — ABNORMAL LOW (ref >60)
Glucose, Bld: 104 mg/dL — ABNORMAL HIGH (ref 70–99)
Potassium: 4.9 mmol/L (ref 3.5–5.1)
Sodium: 140 mmol/L (ref 135–145)

## 2019-09-25 LAB — TROPONIN I (HIGH SENSITIVITY)
Troponin I (High Sensitivity): 17 ng/L (ref <18)
Troponin I (High Sensitivity): 18 ng/L — ABNORMAL HIGH (ref <18)

## 2019-09-25 LAB — CBG MONITORING, ED: Glucose-Capillary: 112 mg/dL — ABNORMAL HIGH (ref 70–99)

## 2019-09-25 LAB — D-DIMER, QUANTITATIVE: D-Dimer, Quant: 1.97 ug/mL-FEU — ABNORMAL HIGH (ref 0.00–0.50)

## 2019-09-25 MED ORDER — CYANOCOBALAMIN 1000 MCG/ML IJ SOLN
1000.0000 ug | Freq: Once | INTRAMUSCULAR | Status: AC
  Administered 2019-09-25: 1000 ug via INTRAMUSCULAR
  Filled 2019-09-25: qty 1

## 2019-09-25 MED ORDER — LEVOTHYROXINE SODIUM 50 MCG PO TABS
25.0000 ug | ORAL_TABLET | Freq: Every day | ORAL | Status: DC
  Administered 2019-09-25: 25 ug via ORAL
  Filled 2019-09-25: qty 1

## 2019-09-25 MED ORDER — VITAMIN B-12 1000 MCG PO TABS: 1000.0000 ug | ORAL_TABLET | Freq: Every day | ORAL | 1 refills | Status: DC

## 2019-09-25 MED ORDER — IOHEXOL 350 MG/ML SOLN
75.0000 mL | Freq: Once | INTRAVENOUS | Status: AC | PRN
  Administered 2019-09-25: 75 mL via INTRAVENOUS

## 2019-09-25 MED ORDER — LEVOTHYROXINE SODIUM 25 MCG PO TABS: 25.0000 ug | ORAL_TABLET | Freq: Every day | ORAL | 0 refills | Status: DC

## 2019-09-25 NOTE — Discharge Summary (Signed)
Physician Discharge Summary  [name] WER:[id number] DOB: November 08, 1922 DOA: 09/24/2019  PCP: Jessica Beard, MD  Admit date: 09/24/2019 Discharge date: 09/25/2019  Admitted From: Home Disposition: Home  Recommendations for Outpatient Follow-up:  1. Follow up with PCP in 1-2 weeks 2. Please obtain BMP/CBC in one week  Home Health:  Equipment/Devices:  Discharge Condition: Stable CODE STATUS: Full code Diet recommendation: Regular diet  Brief/Interim Summary:  [name]  is a 86 y.o. female, with past medical history of of hypertension, she presents to ED secondary to syncope, history was obtained from patient, who is awake alert x3, as well was obtained with son and sister via phone, apparently patient went shopping with her sister, to the grocery store, and when she was sitting behind steering well, her sister noted her to be low to respond, lethargic, and slumped into her chair, sister did not let her to drive, and called the son, who came and found his mom unresponsive, so he brought her to ED, patient is currently awake alert x3, appropriate, not recall these events, was no seizure activities, patient herself denies any fever, chills, chest pain or shortness of breath, she had similar presentation in the past for syncope in 2017, and 2020, she denies dizziness, lightheadedness, any focal deficits or slurred speech. - in ED no significant labs abnormalities, UA was positive, but she denies any urinary symptoms, CT head with no acute findings,abnormal UA, but she denies any symptoms, [facility] consulted for further work-up.  Discharge Diagnoses:  Active Problems:   HLD (hyperlipidemia)   Essential hypertension   GERD   Syncope   Carotid disease, bilateral ([place])  Syncope -Patient presents with syncope, she had similar admission in 2017 for syncope, another ED visit in 2020 for syncope as well work-up then was uneventful. -She was admitted for syncope work-up, and was kept on  gentle hydration, -Echocardiogram was done that was unrevealing -MRI brain did not show any acute infarcts -Once patient was hydrated, she was ambulated did not have any recurrent symptoms. -Since patient felt back to baseline, no further work-up pursued. -Suspect that her syncope may have been related to dehydration.  Hyperlipidemia -Continue with Zocor  GERD -Continue with PPI  Hypertension -Continue with felodipine, blood pressure stable  Abnormal TSH, hypothyroidism -Found to have TSH of almost 12. -Started on levothyroxine -We will need repeat thyroid studies in 4 weeks  B12 deficiency -Found to have B12 level at lower levels are normal at 194 -Started on B12 supplementation orally and received 1 intramuscular dose in the ER.  Abnormal UA, -She denies any urinary symptoms, will hold on antibiotics  Discharge Instructions  Discharge Instructions    Diet - low sodium heart healthy   Complete by: As directed    Increase activity slowly   Complete by: As directed      Allergies as of 09/25/2019      Reactions   Codeine       Medication List    TAKE these medications   acetaminophen 500 MG tablet Commonly known as: TYLENOL Take 500 mg by mouth every 6 (six) hours as needed. For pain   calcium carbonate 500 MG chewable tablet Commonly known as: TUMS - dosed in mg elemental calcium Chew 2 tablets by mouth daily as needed for indigestion.   donepezil 5 MG tablet Commonly known as: ARICEPT Take 5 mg by mouth 2 (two) times daily.   Ensure Plus Liqd Take 237 mLs by mouth daily.   famotidine  20 MG tablet Commonly known as: PEPCID Take 20 mg by mouth daily.   felodipine 5 MG 24 hr tablet Commonly known as: PLENDIL Take 5 mg by mouth 2 (two) times daily.   levothyroxine 25 MCG tablet Commonly known as: SYNTHROID Take 1 tablet (25 mcg total) by mouth daily at 6 (six) AM. Start taking on: September 26, 2019   simvastatin 40 MG tablet Commonly known as:  ZOCOR Take 40 mg by mouth at bedtime.   vitamin B-12 1000 MCG tablet Commonly known as: CYANOCOBALAMIN Take 1 tablet (1,000 mcg total) by mouth daily.       Allergies  Allergen Reactions  . Codeine     Consultations:     Procedures/Studies: CT Head Wo Contrast  Result Date: 09/24/2019 CLINICAL DATA:  Syncopal episode, weakness for several days EXAM: CT HEAD WITHOUT CONTRAST TECHNIQUE: Contiguous axial images were obtained from the base of the skull through the vertex without intravenous contrast. COMPARISON:  CT 01/09/2019 FINDINGS: Brain: No evidence of acute infarction, hemorrhage, hydrocephalus, extra-axial collection or mass lesion/mass effect. Symmetric prominence of the ventricles, cisterns and sulci compatible with parenchymal volume loss. Patchy areas of white matter hypoattenuation are most compatible with chronic microvascular angiopathy. Vascular: Atherosclerotic calcification of the carotid siphons and intradural vertebral arteries. No hyperdense vessel. Skull: No calvarial fracture or suspicious osseous lesion. No scalp swelling or hematoma. Sinuses/Orbits: Paranasal sinuses and mastoid air cells are predominantly clear. Included orbital structures are unremarkable. Other: None. IMPRESSION: 1. No acute intracranial findings. 2. Chronic microvascular angiopathy and parenchymal volume loss. 3. Intracranial atherosclerosis. Electronically Signed   By: Lovena Le M.D.   On: 09/24/2019 15:56   MR BRAIN WO CONTRAST  Result Date: 09/24/2019 CLINICAL DATA:  Syncope. EXAM: MRI HEAD WITHOUT CONTRAST TECHNIQUE: Multiplanar, multiecho pulse sequences of the brain and surrounding structures were obtained without intravenous contrast. COMPARISON:  Head CT 09/24/2019 FINDINGS: Brain: There is no evidence of an acute infarct, intracranial hemorrhage, mass, midline shift, or extra-axial fluid collection. T2 hyperintensities in the cerebral white matter bilaterally are nonspecific but  compatible with mild chronic small vessel ischemic disease. Cerebral atrophy is within normal limits for age. There are punctate chronic infarcts in the cerebellum bilaterally. Vascular: Major intracranial vascular flow voids are preserved. Skull and upper cervical spine: No suspicious marrow lesion. Sinuses/Orbits: Unremarkable orbits. Paranasal sinuses and mastoid air cells are clear. Other: None. IMPRESSION: 1. No acute intracranial abnormality. 2. Mild chronic small vessel ischemic disease. 3. Tiny chronic cerebellar infarcts. Electronically Signed   By: Logan Bores M.D.   On: 09/24/2019 19:40   DG Chest Port 1 View  Result Date: 09/24/2019 CLINICAL DATA:  Weakness, syncope EXAM: PORTABLE CHEST 1 VIEW COMPARISON:  01/09/2019 FINDINGS: The heart size and mediastinal contours are stable with large hiatal hernia. Hyperinflated lungs with chronic emphysematous changes. No focal airspace consolidation, pleural effusion, or pneumothorax. Degenerative changes of the shoulders. IMPRESSION: 1. No acute cardiopulmonary findings. 2. COPD. 3. Large hiatal hernia. Electronically Signed   By: Davina Poke D.O.   On: 09/24/2019 14:44   ECHOCARDIOGRAM COMPLETE  Result Date: 09/25/2019    ECHOCARDIOGRAM REPORT   Patient Name:   DAINELLE HUN Date of Exam: 09/25/2019 Medical Rec #:  [id number]      Height:       62.0 in Accession #:    [id number]     Weight:       100.0 lb Date of Birth:  1922/08/06      BSA:  1.424 m Patient Age:    89 years       BP:           139/63 mmHg Patient Gender: F              HR:           90 bpm. Exam Location:  [facility] Procedure: 2D Echo, Cardiac Doppler and Color Doppler Indications:    Syncope 780.2 / R55  History:        Patient has prior history of Echocardiogram examinations, most                 recent 04/20/2015. Risk Factors:Hypertension, Diabetes and                 Dyslipidemia. GERD, Carcinoid tumor of stomach (From Hx).  Sonographer:    Alvino Chapel RCS Referring  Phys: [place]  1. Left ventricular ejection fraction, by estimation, is 65 to 70%. The left ventricle has hyperdynamic function. The left ventricle has no regional wall motion abnormalities. There is mild concentric left ventricular hypertrophy. Left ventricular diastolic function could not be evaluated.  2. Right ventricular systolic function is normal. The right ventricular size is normal. There is normal pulmonary artery systolic pressure.  3. The mitral valve is grossly normal. Trivial mitral valve regurgitation. No evidence of mitral stenosis.  4. The aortic valve is tricuspid. Aortic valve regurgitation is not visualized. Mild aortic valve sclerosis is present, with no evidence of aortic valve stenosis.  5. The inferior vena cava is normal in size with greater than 50% respiratory variability, suggesting right atrial pressure of 3 mmHg. Comparison(s): No significant change from prior study. Conclusion(s)/Recommendation(s): Otherwise normal echocardiogram, with minor abnormalities described in the report. FINDINGS  Left Ventricle: Hyperdynamic LV, Peak LVOT gradient 16 mmHg but cannot exclude intracavitary gradient. There is a portion of the papillary muscle that is highly mobile--may be ruptured chordae, not well visualized, would not be an etiology for syncope. Left ventricular ejection fraction, by estimation, is 65 to 70%. The left ventricle has hyperdynamic function. The left ventricle has no regional wall motion abnormalities. The left ventricular internal cavity size was normal in size. There is mild concentric left ventricular hypertrophy. Left ventricular diastolic function could not be evaluated. Right Ventricle: The right ventricular size is normal. No increase in right ventricular wall thickness. Right ventricular systolic function is normal. There is normal pulmonary artery systolic pressure. The tricuspid regurgitant velocity is 2.18 m/s, and  with an assumed right  atrial pressure of 3 mmHg, the estimated right ventricular systolic pressure is 62.7 mmHg. Left Atrium: Left atrial size was normal in size. Right Atrium: Right atrial size was normal in size. Pericardium: Trivial pericardial effusion is present. Mitral Valve: The mitral valve is grossly normal. There is mild thickening of the mitral valve leaflet(s). Trivial mitral valve regurgitation. No evidence of mitral valve stenosis. Tricuspid Valve: The tricuspid valve is normal in structure. Tricuspid valve regurgitation is mild . No evidence of tricuspid stenosis. Aortic Valve: The aortic valve is tricuspid. Aortic valve regurgitation is not visualized. Mild aortic valve sclerosis is present, with no evidence of aortic valve stenosis. Moderate aortic valve annular calcification. There is mild calcification of the aortic valve. Pulmonic Valve: The pulmonic valve was not well visualized. Pulmonic valve regurgitation is not visualized. No evidence of pulmonic stenosis. Aorta: The aortic root, ascending aorta and aortic arch are all structurally normal, with no evidence of dilitation or  obstruction. Venous: The inferior vena cava is normal in size with greater than 50% respiratory variability, suggesting right atrial pressure of 3 mmHg. IAS/Shunts: The atrial septum is grossly normal.  LEFT VENTRICLE PLAX 2D LVIDd:         3.46 cm LVIDs:         2.17 cm LV PW:         1.15 cm LV IVS:        1.13 cm LVOT diam:     1.90 cm LV SV:         75 LV SV Index:   52 LVOT Area:     2.84 cm  RIGHT VENTRICLE RV S prime:     14.70 cm/s TAPSE (M-mode): 2.3 cm LEFT ATRIUM             Index       RIGHT ATRIUM           Index LA diam:        2.10 cm 1.47 cm/m  RA Area:     10.80 cm LA Vol (A2C):   48.8 ml 34.27 ml/m RA Volume:   20.40 ml  14.33 ml/m LA Vol (A4C):   30.9 ml 21.70 ml/m LA Biplane Vol: 39.0 ml 27.39 ml/m  AORTIC VALVE LVOT Vmax:   140.00 cm/s LVOT Vmean:  92.500 cm/s LVOT VTI:    0.263 m  AORTA Ao Root diam: 2.70 cm MITRAL  VALVE                TRICUSPID VALVE MV Area (PHT): 6.07 cm     TR Peak grad:   19.0 mmHg MV Decel Time: 125 msec     TR Vmax:        218.00 cm/s MV E velocity: 78.10 cm/s MV A velocity: 134.00 cm/s  SHUNTS MV E/A ratio:  0.58         Systemic VTI:  0.26 m                             Systemic Diam: 1.90 cm Buford Dresser MD Electronically signed by Buford Dresser MD Signature Date/Time: 09/25/2019/1:27:24 PM    Final        Subjective: Feeling better this morning.  Did not have any recurrence of dizziness on ambulation.  No further episodes of syncope.  No nausea or vomiting.  Discharge Exam: Vitals:   09/25/19 1230 09/25/19 1300 09/25/19 1330 09/25/19 1403  BP: (!) 163/58 (!) 131/56 (!) 112/55 (!) 146/65  Pulse:    87  Resp:      Temp:    (!) 97.4 F (36.3 C)  TempSrc:    Oral  SpO2:    98%  Weight:      Height:        General: Pt is alert, awake, not in acute distress Cardiovascular: RRR, S1/S2 +, no rubs, no gallops Respiratory: CTA bilaterally, no wheezing, no rhonchi Abdominal: Soft, NT, ND, bowel sounds + Extremities: no edema, no cyanosis    The results of significant diagnostics from this hospitalization (including imaging, microbiology, ancillary and laboratory) are listed below for reference.     Microbiology: Recent Results (from the past 240 hour(s))  SARS Coronavirus 2 by RT PCR (hospital order, performed in [facility] hospital lab) Nasopharyngeal Nasopharyngeal Swab     Status: None   Collection Time: 09/24/19  3:30 PM   Specimen: Nasopharyngeal Swab  Result Value Ref Range  Status   SARS Coronavirus 2 NEGATIVE NEGATIVE Final    Comment: (NOTE) SARS-CoV-2 target nucleic acids are NOT DETECTED.  The SARS-CoV-2 RNA is generally detectable in upper and lower respiratory specimens during the acute phase of infection. The lowest concentration of SARS-CoV-2 viral copies this assay can detect is 250 copies / mL. A negative result does not preclude  SARS-CoV-2 infection and should not be used as the sole basis for treatment or other patient management decisions.  A negative result may occur with improper specimen collection / handling, submission of specimen other than nasopharyngeal swab, presence of viral mutation(s) within the areas targeted by this assay, and inadequate number of viral copies (<250 copies / mL). A negative result must be combined with clinical observations, patient history, and epidemiological information.  Fact Sheet for Patients:   StrictlyIdeas.no  Fact Sheet for Healthcare Providers: BankingDealers.co.za  This test is not yet approved or  cleared by the [place] FDA and has been authorized for detection and/or diagnosis of SARS-CoV-2 by FDA under an Emergency Use Authorization (EUA).  This EUA will remain in effect (meaning this test can be used) for the duration of the COVID-19 declaration under Section 564(b)(1) of the Act, 21 U.S.C. section 360bbb-3(b)(1), unless the authorization is terminated or revoked sooner.  Performed at [facility], [address] [place], [place] [address]      Labs: BNP (last 3 results) No results for input(s): BNP in the last 8760 hours. Basic Metabolic Panel: Recent Labs  Lab 09/24/19 1418 09/25/19 0819 09/25/19 1735  NA 140 140 140  K 3.7 3.8 4.9  CL 106 105 104  CO2 22 24 23   GLUCOSE 132* 117* 104*  BUN 18 15 20   CREATININE 1.00 0.90 1.27*  CALCIUM 8.8* 8.9 9.7   Liver Function Tests: Recent Labs  Lab 09/24/19 1418 09/25/19 0819  AST 25 22  ALT 11 11  ALKPHOS 83 71  BILITOT 0.6 0.8  PROT 7.9 7.1  ALBUMIN 4.2 3.7   No results for input(s): LIPASE, AMYLASE in the last 168 hours. No results for input(s): AMMONIA in the last 168 hours. CBC: Recent Labs  Lab 09/24/19 1418 09/25/19 0819 09/25/19 1735  WBC 6.3 6.8 7.4  HGB 10.2* 9.9* 10.5*  HCT 34.2* 32.8* 35.5*  MCV 96.9 95.1 95.4  PLT 179  223 248   Cardiac Enzymes: No results for input(s): CKTOTAL, CKMB, CKMBINDEX, TROPONINI in the last 168 hours. BNP: Invalid input(s): POCBNP CBG: Recent Labs  Lab 09/24/19 1351 09/25/19 0759  GLUCAP 105* 112*   D-Dimer Recent Labs    09/25/19 1735  DDIMER 1.97*   Hgb A1c No results for input(s): HGBA1C in the last 72 hours. Lipid Profile No results for input(s): CHOL, HDL, LDLCALC, TRIG, CHOLHDL, LDLDIRECT in the last 72 hours. Thyroid function studies Recent Labs    09/24/19 1418  TSH 11.872*   Anemia work up Recent Labs    09/24/19 1418  VITAMINB12 194  FOLATE 12.2   Urinalysis    Component Value Date/Time   COLORURINE YELLOW 09/24/2019 1530   APPEARANCEUR HAZY (A) 09/24/2019 1530   LABSPEC 1.008 09/24/2019 1530   PHURINE 7.0 09/24/2019 1530   GLUCOSEU NEGATIVE 09/24/2019 1530   HGBUR SMALL (A) 09/24/2019 1530   BILIRUBINUR NEGATIVE 09/24/2019 1530   KETONESUR NEGATIVE 09/24/2019 1530   PROTEINUR NEGATIVE 09/24/2019 1530   UROBILINOGEN 0.2 04/18/2014 1420   NITRITE NEGATIVE 09/24/2019 1530   LEUKOCYTESUR MODERATE (A) 09/24/2019 1530   Sepsis Labs  Invalid input(s): PROCALCITONIN,  WBC,  LACTICIDVEN Microbiology Recent Results (from the past 240 hour(s))  SARS Coronavirus 2 by RT PCR (hospital order, performed in [facility] hospital lab) Nasopharyngeal Nasopharyngeal Swab     Status: None   Collection Time: 09/24/19  3:30 PM   Specimen: Nasopharyngeal Swab  Result Value Ref Range Status   SARS Coronavirus 2 NEGATIVE NEGATIVE Final    Comment: (NOTE) SARS-CoV-2 target nucleic acids are NOT DETECTED.  The SARS-CoV-2 RNA is generally detectable in upper and lower respiratory specimens during the acute phase of infection. The lowest concentration of SARS-CoV-2 viral copies this assay can detect is 250 copies / mL. A negative result does not preclude SARS-CoV-2 infection and should not be used as the sole basis for treatment or other patient management  decisions.  A negative result may occur with improper specimen collection / handling, submission of specimen other than nasopharyngeal swab, presence of viral mutation(s) within the areas targeted by this assay, and inadequate number of viral copies (<250 copies / mL). A negative result must be combined with clinical observations, patient history, and epidemiological information.  Fact Sheet for Patients:   StrictlyIdeas.no  Fact Sheet for Healthcare Providers: BankingDealers.co.za  This test is not yet approved or  cleared by the [place] FDA and has been authorized for detection and/or diagnosis of SARS-CoV-2 by FDA under an Emergency Use Authorization (EUA).  This EUA will remain in effect (meaning this test can be used) for the duration of the COVID-19 declaration under Section 564(b)(1) of the Act, 21 U.S.C. section 360bbb-3(b)(1), unless the authorization is terminated or revoked sooner.  Performed at [facility], [address] [place], [place] [address]      Time coordinating discharge: 58mins  SIGNED:   Kathie Dike, MD  [facility] 09/25/2019, 9:25 PM   If 7PM-7AM, please contact night-coverage [email or web address]

## 2019-09-25 NOTE — ED Provider Notes (Signed)
[facility] Emergency Department Provider Note MRN:  [id number]  Arrival date & time: 09/25/19     Chief Complaint   Loss of Consciousness   History of Present Illness   [name] is a 86 y.o. year-old female with a history of hypertension presenting to the ED with chief complaint of loss of consciousness.  Patient was admitted to the hospital yesterday for syncopal episode.  Discharged today but on the way out of the hospital she had recurrent syncopal episode.  She denies any chest pain or shortness of breath.  She does not know why she passes out.  Denies any leg pain or swelling, no abdominal pain, no dizziness, no diaphoresis.  Review of Systems  A complete 10 system review of systems was obtained and all systems are negative except as noted in the HPI and PMH.   Patient's Health History    Past Medical History:  Diagnosis Date  . Adult BMI 19-24 kg/sq m 2008 125 lbs  . Anemia    Pernicious 2008-HB 12 MCV 119.5; JAN 2012 HB 13.2 MCV 97.6  . Carcinoid tumor of stomach 2008  . Diverticulosis   . GERD (gastroesophageal reflux disease)   . Hiatal hernia   . HTN (hypertension)   . Hyperlipidemia   . 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 Pernicious anemia   . Pernicious anemia 06/14/2010  . Wrist fracture, left 2008    Past Surgical History:  Procedure Laterality Date  . COLONOSCOPY  2003 LS   DC/Biggers Diverticulosis  . EUS  2008 DJ   FNA NO CARCINOID  . EUS  JAN 2009   NL EXAM, NO Bx  . EUS  MAR 2011 WO   CARCINOID  . UPPER GASTROINTESTINAL ENDOSCOPY  APR 2008 SLF   CARCIONOID  . UPPER GASTROINTESTINAL ENDOSCOPY  FEB 2011 SL[facility]   CARCINOID/mild anemia/large hiatal hernia    Family History  Problem Relation Age of Onset  . Colon cancer Neg Hx   . Colon polyps Neg Hx     Social History   Socioeconomic History  . Marital status: Widowed    Spouse name: Not on file  . Number of children: Not on file  . Years of education: Not on file  .  Highest education level: Not on file  Occupational History  . Not on file  Tobacco Use  . Smoking status: Never Smoker  . Smokeless tobacco: Never Used  Vaping Use  . Vaping Use: Never used  Substance and Sexual Activity  . Alcohol use: No  . Drug use: No  . Sexual activity: Not Currently    Birth control/protection: Post-menopausal  Other Topics Concern  . Not on file  Social History Narrative  . Not on file   Social Determinants of Health   Financial Resource Strain:   . Difficulty of Paying Living Expenses: Not on file  Food Insecurity:   . Worried About Charity fundraiser in the Last Year: Not on file  . Ran Out of Food in the Last Year: Not on file  Transportation Needs:   . Lack of Transportation (Medical): Not on file  . Lack of Transportation (Non-Medical): Not on file  Physical Activity:   . Days of Exercise per Week: Not on file  . Minutes of Exercise per Session: Not on file  Stress:   . Feeling of Stress : Not on file  Social Connections:   . Frequency of Communication with Friends and Family: Not on file  .  Frequency of Social Gatherings with Friends and Family: Not on file  . Attends Religious Services: Not on file  . Active Member of Clubs or Organizations: Not on file  . Attends Archivist Meetings: Not on file  . Marital Status: Not on file  Intimate Partner Violence:   . Fear of Current or Ex-Partner: Not on file  . Emotionally Abused: Not on file  . Physically Abused: Not on file  . Sexually Abused: Not on file     Physical Exam   Vitals:   09/25/19 1632  BP: 114/82  Pulse: 92  Resp: 18  Temp: 98.4 F (36.9 C)  SpO2: 100%    CONSTITUTIONAL: Well-appearing, NAD NEURO:  Alert and oriented x 3, no focal deficits EYES:  eyes equal and reactive ENT/NECK:  no LAD, no JVD CARDIO: Regular rate, well-perfused, normal S1 and S2 PULM:  CTAB no wheezing or rhonchi GI/GU:  normal bowel sounds, non-distended, non-tender MSK/SPINE:  No  gross deformities, no edema SKIN:  no rash, atraumatic PSYCH:  Appropriate speech and behavior  *Additional and/or pertinent findings included in MDM below  Diagnostic and Interventional Summary    EKG Interpretation  Date/Time:  Saturday September 25 2019 17:29:09 EDT Ventricular Rate:  93 PR Interval:  240 QRS Duration: 60 QT Interval:  394 QTC Calculation: 489 R Axis:   66 Text Interpretation: Sinus rhythm with 1st degree A-V block Right atrial enlargement Minimal voltage criteria for LVH, may be normal variant ( Sokolow-Lyon ) Anteroseptal infarct , age undetermined Abnormal ECG Confirmed by Gerlene Fee [phone]) on 09/25/2019 6:24:09 PM      Labs Reviewed  D-DIMER, QUANTITATIVE (NOT AT [facility]) - Abnormal; Notable for the following components:      Result Value   D-Dimer, Quant 1.97 (*)    All other components within normal limits  CBC - Abnormal; Notable for the following components:   RBC 3.72 (*)    Hemoglobin 10.5 (*)    HCT 35.5 (*)    MCHC 29.6 (*)    All other components within normal limits  BASIC METABOLIC PANEL - Abnormal; Notable for the following components:   Glucose, Bld 104 (*)    Creatinine, Ser 1.27 (*)    GFR calc non Af Amer 36 (*)    GFR calc Af Amer 42 (*)    All other components within normal limits  TROPONIN I (HIGH SENSITIVITY) - Abnormal; Notable for the following components:   Troponin I (High Sensitivity) 18 (*)    All other components within normal limits  TROPONIN I (HIGH SENSITIVITY)    CT ANGIO CHEST PE W OR WO CONTRAST        Medications  iohexol (OMNIPAQUE) 350 MG/ML injection 75 mL (75 mLs Intravenous Contrast Given 09/25/19 2043)     Procedures  /  Critical Care Procedures  ED Course and Medical Decision Making  I have reviewed the triage vital signs, the nursing notes, and pertinent available records from the EMR.  Listed above are laboratory and imaging tests that I personally ordered, reviewed, and interpreted and then  considered in my medical decision making (see below for details).  Recurrent syncope, had an extensive work-up including brain MRI, echocardiogram.  Has not had recent evaluation for pulmonary embolism, will screen with D-dimer.     Ddimer is positive, awaiting CTPA.  Patient found to have bed bugs and CT requiring debugging prior to scan.  Without PE on CT I feel patient would be appropriate for  discharge and PCP follow up.  Signed out to oncoming provider at shift change.  Barth Kirks. Sedonia Small, [place] [email or web address]  Final Clinical Impressions(s) / ED Diagnoses     ICD-10-CM   1. Syncope, unspecified syncope type  R55     ED Discharge Orders    None       Discharge Instructions Discussed with and Provided to Patient:   Discharge Instructions   None       Maudie Flakes, MD 09/25/19 2302

## 2019-09-25 NOTE — ED Notes (Signed)
Pt clothes removed and placed in pt bag- gown placed on pt- Larry in cT made aware.

## 2019-09-25 NOTE — Evaluation (Signed)
Physical Therapy Evaluation Patient Details Name: [name] MRN: [id number] DOB: Aug 02, 1922 Today's Date: 09/25/2019   History of Present Illness  [name]  is a 86 y.o. female, with past medical history of of hypertension, she presents to ED secondary to syncope, history was obtained from patient, who is awake alert x3, as well was obtained with son and sister via phone, apparently patient went shopping with her sister, to the grocery store, and when she was sitting behind steering well, her sister noted her to be low to respond, lethargic, and slumped into her chair, sister did not let her to drive, and called the son, who came and found his mom unresponsive, so he brought her to ED, patient is currently awake alert x3, appropriate, not recall these events, was no seizure activities, patient herself denies any fever, chills, chest pain or shortness of breath, she had similar presentation in the past for syncope in 2017, and 2020, she denies dizziness, lightheadedness, any focal deficits or slurred speech.   Clinical Impression    Patient in bed and agreeable to therapy evaluation. Patient able to perform all bed mobilities with modified independence and able to stand edge of bed for 3 minutes while linens on bed were changed with no difficulties or upper extremity assist. Patient able to ambulate with no AD in hospital with supervision, slow gait speed noted but no other gait abnormalities noted at this time. Patient presents with no need for physical therapy at this time as she is at baseline. Patient discharged from physical therapy to nursing staff for ambulation as tolerated daily for length of stay.  Follow Up Recommendations No PT follow up    Equipment Recommendations  None recommended by PT    Recommendations for Other Services       Precautions / Restrictions Precautions Precautions: None Restrictions Weight Bearing Restrictions: No      Mobility  Bed Mobility Overal  bed mobility: Modified Independent             General bed mobility comments: slight increase in time to perform bed mobilities  Transfers Overall transfer level: Modified independent Equipment used: None                Ambulation/Gait Ambulation/Gait assistance: Supervision Gait Distance (Feet): 100 Feet Assistive device: None Gait Pattern/deviations: Decreased stride length     General Gait Details: decreased gait speed  Stairs            Wheelchair Mobility    Modified Rankin (Stroke Patients Only)       Balance Overall balance assessment: Modified Independent (able to sit edge of bed and stand with supervision, holds on)                                           Pertinent Vitals/Pain      Home Living Family/patient expects to be discharged to:: Private residence Living Arrangements: Children   Type of Home: House Home Access: Level entry     Home Layout: Able to live on main level with bedroom/bathroom Home Equipment: None      Prior Function Level of Independence: Independent         Comments: able to drive, walk without AD     Hand Dominance        Extremity/Trunk Assessment        Lower Extremity Assessment Lower Extremity  Assessment: Overall WFL for tasks assessed       Communication   Communication: No difficulties  Cognition Arousal/Alertness: Awake/alert Behavior During Therapy: WFL for tasks assessed/performed Overall Cognitive Status: Within Functional Limits for tasks assessed                                        General Comments      Exercises General Exercises - Lower Extremity Ankle Circles/Pumps: AROM;Left;Right;10 reps;Seated Long Arc Quad: AROM;Right;Left;10 reps;Seated Hip Flexion/Marching: AROM;Right;Left;10 reps;Seated   Assessment/Plan    PT Assessment Patent does not need any further PT services  PT Problem List         PT Treatment Interventions       PT Goals (Current goals can be found in the Care Plan section)  Acute Rehab PT Goals Patient Stated Goal: to go home PT Goal Formulation: With patient Time For Goal Achievement: 10/09/19 Potential to Achieve Goals: Good    Frequency     Barriers to discharge  none      Co-evaluation               AM-PAC PT "6 Clicks" Mobility  Outcome Measure Help needed turning from your back to your side while in a flat bed without using bedrails?: None Help needed moving from lying on your back to sitting on the side of a flat bed without using bedrails?: None Help needed moving to and from a bed to a chair (including a wheelchair)?: None Help needed standing up from a chair using your arms (e.g., wheelchair or bedside chair)?: None Help needed to walk in hospital room?: None Help needed climbing 3-5 steps with a railing? : A Little 6 Click Score: 23    End of Session Equipment Utilized During Treatment: Gait belt Activity Tolerance: Patient tolerated treatment well Patient left: in bed;with call bell/phone within reach Nurse Communication: Mobility status PT Visit Diagnosis: Muscle weakness (generalized) (M62.81);Unsteadiness on feet (R26.81)    Time: 6213-0865 PT Time Calculation (min) (ACUTE ONLY): 24 min   Charges:   PT Evaluation $PT Eval Low Complexity: 1 Low PT Treatments $Therapeutic Exercise: 8-22 mins      12:47 PM, 09/25/19 Jerene Pitch, DPT Physical Therapy with [facility]  [phone] office

## 2019-09-25 NOTE — Progress Notes (Signed)
*  PRELIMINARY RESULTS* Echocardiogram 2D Echocardiogram has been performed.  Samuel Germany 09/25/2019, 11:12 AM

## 2019-09-25 NOTE — ED Notes (Signed)
Ambulated with therapy

## 2019-09-25 NOTE — ED Notes (Signed)
Pt changed out of wet pants and into a brief. Wet clothing put into belongings bag on pt's bed.

## 2019-09-25 NOTE — ED Notes (Signed)
Pt took [place] off and got out of bed and walked to restroom. Pt bed linens changed and gown changed. New purewick placed on pt.

## 2019-09-25 NOTE — ED Triage Notes (Signed)
Passed out after discharge

## 2019-09-26 DIAGNOSIS — R55 Syncope and collapse: Secondary | ICD-10-CM | POA: Diagnosis not present

## 2019-09-26 DIAGNOSIS — K449 Diaphragmatic hernia without obstruction or gangrene: Secondary | ICD-10-CM | POA: Diagnosis not present

## 2019-09-26 DIAGNOSIS — R7989 Other specified abnormal findings of blood chemistry: Secondary | ICD-10-CM | POA: Diagnosis not present

## 2019-09-26 DIAGNOSIS — I700 Atherosclerosis of aorta: Secondary | ICD-10-CM | POA: Diagnosis not present

## 2019-09-26 NOTE — Discharge Instructions (Addendum)
Your test tonight, the CT of your lungs did not show evidence of any blood clots.  You did have some minor problems that your primary care doctor can follow-up on in the office.  No driving until you are cleared to drive by your primary care doctor.  Call your primary care doctor get to get a follow-up appointment in the next 1 to 2 weeks.  You might consider having a cardiologist evaluate you again for your passing out spells.

## 2019-09-26 NOTE — ED Provider Notes (Signed)
Patient left at change of shift to get results of a CTA chest.  Patient was admitted September 3 through September 4 for evaluation for syncope.  She had a echocardiogram which was unrevealing for etiology of syncope, MRI of the brain that did not show an acute infarct.  She was felt to be dehydrated and was rehydrated.  They also reports she had an admission in 2017 for syncope and a ED visit in 2020 for syncope.    12:40 AM patient is resting in no distress.  I let her know she did not have a blood clot in her lungs.  She can be discharged home.  She did have some abnormalities on her CT that her primary care doctor needs to follow-up on.  When I review her chart she was seen by cardiology in 2016.  She will be given the number to be evaluated again.   CT ANGIO CHEST PE W OR WO CONTRAST  Result Date: 09/26/2019 CLINICAL DATA:  Positive D-dimer, syncope, carcinoid tumor of the stomach EXAM: CT ANGIOGRAPHY CHEST WITH CONTRAST TECHNIQUE: Multidetector CT imaging of the chest was performed using the standard protocol during bolus administration of intravenous contrast. Multiplanar CT image reconstructions and MIPs were obtained to evaluate the vascular anatomy. CONTRAST:  69mL OMNIPAQUE IOHEXOL 350 MG/ML SOLN COMPARISON:  None. FINDINGS: Cardiovascular: There is adequate opacification of the pulmonary arterial tree through the segmental level. There is no intraluminal filling defect to suggest acute pulmonary embolism. The central pulmonary arteries are of normal caliber. Mild coronary artery calcification. Global cardiac size within normal limits. No pericardial effusion. The thoracic aorta is age-appropriate with moderate atherosclerotic calcification noted. Mediastinum/Nodes: No pathologic thoracic adenopathy. Large hiatal hernia which much of the stomach within the intrathoracic compartment. Lungs/Pleura: 6 mm nodule within the left upper lobe, axial image # 40/6. 4 mm nodule within the subpleural inferior  lingula, axial image # 74/6 indeterminate. Adjacent nodular infiltrate may be infectious or inflammatory in nature. The lungs are otherwise clear. No pneumothorax or pleural effusion. Central airways are widely patent. Upper Abdomen: Cholelithiasis. Multiple simple cortical cysts are seen within the visualized upper poles of the kidneys bilaterally. However, a heterogeneous likely solid mass is seen within the upper pole the left kidney, incompletely evaluated on this examination. This measures roughly 2.8 cm on axial image # 92/4. Musculoskeletal: The osseous structures are age-appropriate. Review of the MIP images confirms the above findings. IMPRESSION: 1. No evidence of acute pulmonary embolism. 2. Indeterminate left upper lobe and lingular nodules, measuring up to 6 mm. Follow-up CT in 3-6 months is recommended. 3. Heterogeneous likely solid mass within the upper pole of the left kidney, incompletely evaluated on this examination. Further evaluation with renal ultrasound is recommended. 4. Large hiatal hernia. 5. Cholelithiasis. Aortic Atherosclerosis (ICD10-I70.0). Electronically Signed   By: Fidela Salisbury MD   On: 09/26/2019 00:29    CT Head Wo Contrast  Result Date: 09/24/2019 CLINICAL DATA:  Syncopal episode, weakness for several days  IMPRESSION: 1. No acute intracranial findings. 2. Chronic microvascular angiopathy and parenchymal volume loss. 3. Intracranial atherosclerosis. Electronically Signed   By: Lovena Le M.D.   On: 09/24/2019 15:56   MR BRAIN WO CONTRAST  Result Date: 09/24/2019 CLINICAL DATA:  Syncope.  IMPRESSION: 1. No acute intracranial abnormality. 2. Mild chronic small vessel ischemic disease. 3. Tiny chronic cerebellar infarcts. Electronically Signed   By: Logan Bores M.D.   On: 09/24/2019 19:40   DG Chest [facility] 1 View  Result  Date: 09/24/2019 CLINICAL DATA:  Weakness, syncope  IMPRESSION: 1. No acute cardiopulmonary findings. 2. COPD. 3. Large hiatal hernia. Electronically  Signed   By: Davina Poke D.O.   On: 09/24/2019 14:44   ECHOCARDIOGRAM COMPLETE  Result Date: 09/25/2019 IMPRESSIONS  1. Left ventricular ejection fraction, by estimation, is 65 to 70%. The left ventricle has hyperdynamic function. The left ventricle has no regional wall motion abnormalities. There is mild concentric left ventricular hypertrophy. Left ventricular diastolic function could not be evaluated.  2. Right ventricular systolic function is normal. The right ventricular size is normal. There is normal pulmonary artery systolic pressure.  3. The mitral valve is grossly normal. Trivial mitral valve regurgitation. No evidence of mitral stenosis.  4. The aortic valve is tricuspid. Aortic valve regurgitation is not visualized. Mild aortic valve sclerosis is present, with no evidence of aortic valve stenosis.  5. The inferior vena cava is normal in size with greater than 50% respiratory variability, suggesting right atrial pressure of 3 mmHg. Comparison(s): No significant change from prior study. Conclusion(s)/Recommendation(s): Otherwise normal echocardiogram, with minor abnormalities described in the report  Diagnoses that have been ruled out:  None  Diagnoses that are still under consideration:  None  Final diagnoses:  Syncope, unspecified syncope type  Syncope and collapse   ED Discharge Orders    None     Plan discharge  Rolland Porter, MD, Barbette Or, MD 09/26/19 [phone]

## 2019-09-26 NOTE — ED Notes (Signed)
Patient transported to CT 

## 2019-09-27 LAB — URINE CULTURE: Culture: >=100000 — AB

## 2019-11-08 ENCOUNTER — Ambulatory Visit: Payer: PPO | Admitting: Cardiology

## 2019-11-08 NOTE — Progress Notes (Signed)
Cardiology Office Note:   Date:  11/11/2019  NAME:  [name]    MRN: [id number] DOB:  03-29-1922   PCP:  Iona Beard, MD  Cardiologist:  No primary care provider on file.   Referring MD: Iona Beard, MD   Chief Complaint  Patient presents with  . Near Syncope   History of Present Illness:   [name] is a 86 y.o. 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 Evaluated for recurrent syncope in September. Troponin negative on several occasions. CT PE study negative.   She presents with her son.  Apparently roughly 1 month ago she had an episode where she was unresponsive.  He reports she did not lose consciousness but just not responding to questions appropriately.  Apparently she was behind the wheel of her car but had not driven anywhere.  She was taken to the emergency room.  Troponins were negative for MI.  Echo was unremarkable.  Her thyroid studies were abnormal.  She is not followed up with her primary care on this.  Her B12 level was also low.  She is not seen her oncologist in several years per her neuroendocrine tumor.  She apparently had no further episodes.  She did have an immediate episode after leaving the hospital and went right back to the emergency room but the work-up again was unremarkable.  She was then referred to cardiology.  She reports she does not exercise routinely but does activities around the house.  She denies any chest pain or shortness of breath.  She has no lower extremity edema.  It appears she has been quite stable but over the past 1 to 2 years has not seen her primary care doctor that often or her oncologist.  Her EKG today demonstrates normal sinus rhythm with an old anteroseptal infarct and single PVC.  She denies any rapid heartbeat sensation or palpitation.  She does not remember the event.   There is some memory issues and she is on Aricept.  Blood pressure today 174/87.  She is on felodipine.  He reports it is unclear if she is taking her thyroid medicine.  Apparently she was started on this in the hospital.  She does need follow-up on this.  Her B12 was also marginal and she needs to get started back on medications for this.  We did discuss that her oncologist needs to reevaluate her labs as well.  I do not suspect she has carcinoid syndrome but we do need to make sure this is not an issue.  It also appears that eating and drinking are a big issue.  She is not drinking nearly enough water.  Her BMI 17.4.  She is not eating enough either.  She has moved in with her son recently.  Problem List 1. Neuroendocrine carcinoid tumor of stomach -no symptoms as of 03/2018 per heme/onc 2. B12 deficiency 3. Pernicious anemia  4. HTN  Past Medical History: Past Medical History:  Diagnosis Date  . Adult BMI 19-24 kg/sq m 2008 125 lbs  . Anemia    Pernicious 2008-HB 12 MCV 119.5; JAN 2012 HB 13.2 MCV 97.6  . Carcinoid tumor of stomach 2008  . Diverticulosis   . GERD (gastroesophageal reflux disease)   . Hiatal hernia   . HTN (hypertension)   . Hyperlipidemia   . Mild memory  loss 02/15/2011  . Pernicious anemia   . Pernicious anemia 06/14/2010  . Wrist fracture, left 2008    Past Surgical History: Past Surgical History:  Procedure Laterality Date  . COLONOSCOPY  2003 LS   DC/[place] Diverticulosis  . EUS  2008 DJ   FNA NO CARCINOID  . EUS  JAN 2009   NL EXAM, NO Bx  . EUS  MAR 2011 WO   CARCINOID  . UPPER GASTROINTESTINAL ENDOSCOPY  APR 2008 [facility]   CARCIONOID  . UPPER GASTROINTESTINAL ENDOSCOPY  FEB 2011 [facility]   CARCINOID/mild anemia/large hiatal hernia    Current Medications: Current Meds  Medication Sig  . acetaminophen (TYLENOL) 500 MG tablet Take 500 mg by mouth every 6 (six) hours as needed. For pain  . calcium carbonate (TUMS - DOSED IN MG ELEMENTAL CALCIUM) 500 MG chewable  tablet Chew 2 tablets by mouth daily as needed for indigestion.   Marland Kitchen donepezil (ARICEPT) 5 MG tablet Take 5 mg by mouth 2 (two) times daily.   Marland Kitchen ENSURE PLUS (ENSURE PLUS) LIQD Take 237 mLs by mouth daily.  . famotidine (PEPCID) 20 MG tablet Take 20 mg by mouth daily.  . felodipine (PLENDIL) 5 MG 24 hr tablet Take 5 mg by mouth 2 (two) times daily.   Marland Kitchen levothyroxine (SYNTHROID) 25 MCG tablet Take 1 tablet (25 mcg total) by mouth daily at 6 (six) AM.  . simvastatin (ZOCOR) 40 MG tablet Take 40 mg by mouth at bedtime.    . vitamin B-12 (CYANOCOBALAMIN) 1000 MCG tablet Take 1 tablet (1,000 mcg total) by mouth daily.     Allergies:    Codeine   Social History: Social History   Socioeconomic History  . Marital status: Widowed    Spouse name: Not on file  . Number of children: 1  . Years of education: Not on file  . Highest education level: Not on file  Occupational History  . Not on file  Tobacco Use  . Smoking status: Never Smoker  . Smokeless tobacco: Never Used  Vaping Use  . Vaping Use: Never used  Substance and Sexual Activity  . Alcohol use: No  . Drug use: No  . Sexual activity: Not Currently    Birth control/protection: Post-menopausal  Other Topics Concern  . Not on file  Social History Narrative  . Not on file   Social Determinants of Health   Financial Resource Strain:   . Difficulty of Paying Living Expenses: Not on file  Food Insecurity:   . Worried About Charity fundraiser in the Last Year: Not on file  . Ran Out of Food in the Last Year: Not on file  Transportation Needs:   . Lack of Transportation (Medical): Not on file  . Lack of Transportation (Non-Medical): Not on file  Physical Activity:   . Days of Exercise per Week: Not on file  . Minutes of Exercise per Session: Not on file  Stress:   . Feeling of Stress : Not on file  Social Connections:   . Frequency of Communication with Friends and Family: Not on file  . Frequency of Social Gatherings with  Friends and Family: Not on file  . Attends Religious Services: Not on file  . Active Member of Clubs or Organizations: Not on file  . Attends Archivist Meetings: Not on file  . Marital Status: Not on file     Family History: The patient's family history includes Cancer in her mother; Heart disease in  her brother. There is no history of Colon cancer or Colon polyps.  ROS:   All other ROS reviewed and negative. Pertinent positives noted in the HPI.     EKGs/Labs/Other Studies Reviewed:   The following studies were personally reviewed by me today:  EKG:  EKG is ordered today.  The ekg ordered today demonstrates normal sinus rhythm, heart rate 82, old anteroseptal infarct, LVH by voltage, single PVC, and was personally reviewed by me.   TTE 09/25/2019 1. Left ventricular ejection fraction, by estimation, is 65 to 70%. The  left ventricle has hyperdynamic function. The left ventricle has no  regional wall motion abnormalities. There is mild concentric left  ventricular hypertrophy. Left ventricular  diastolic function could not be evaluated.  2. Right ventricular systolic function is normal. The right ventricular  size is normal. There is normal pulmonary artery systolic pressure.  3. The mitral valve is grossly normal. Trivial mitral valve  regurgitation. No evidence of mitral stenosis.  4. The aortic valve is tricuspid. Aortic valve regurgitation is not  visualized. Mild aortic valve sclerosis is present, with no evidence of  aortic valve stenosis.  5. The inferior vena cava is normal in size with greater than 50%  respiratory variability, suggesting right atrial pressure of 3 mmHg.   Recent Labs: 09/24/2019: TSH 11.872 09/25/2019: ALT 11; BUN 20; Creatinine, Ser 1.27; Hemoglobin 10.5; Platelets 248; Potassium 4.9; Sodium 140   Recent Lipid Panel No results found for: CHOL, TRIG, HDL, CHOLHDL, VLDL, LDLCALC, LDLDIRECT  Physical Exam:   VS:  BP (!) 174/87   Pulse 82    Wt 94 lb 12.8 oz (43 kg)   SpO2 99%   BMI 17.34 kg/m    Wt Readings from Last 3 Encounters:  11/11/19 94 lb 12.8 oz (43 kg)  09/24/19 100 lb (45.4 kg)  01/09/19 111 lb 1.8 oz (50.4 kg)    General: Well nourished, well developed, in no acute distress Heart: Atraumatic, normal size  Eyes: PEERLA, EOMI  Neck: Supple, no JVD Endocrine: No thryomegaly Cardiac: Normal S1, S2; RRR; no murmurs, rubs, or gallops Lungs: Clear to auscultation bilaterally, no wheezing, rhonchi or rales  Abd: Soft, nontender, no hepatomegaly  Ext: No edema, pulses 2+ Musculoskeletal: No deformities, BUE and BLE strength normal and equal Skin: Warm and dry, no rashes   Neuro: Alert and oriented to person, place, time, and situation, CNII-XII grossly intact, no focal deficits  Psych: Normal mood and affect   ASSESSMENT:   [name] is a 86 y.o. female who presents for the following: 1. Syncope and collapse   2. Primary hypertension     PLAN:   1. Syncope and collapse -EKG without acute ischemic changes.  Echocardiogram with normal LV function.  No significant murmurs on examination. -She did not actually lose consciousness per the report of the son.  Just became unresponsive. -She has several metabolic derangement including elevated TSH and low B12.  She also has known neuroendocrine tumor is I have repeat lab work for possible carcinoid syndrome. -She denies any chest pain or shortness of breath prior to the event.  She has no recollection of this. -She does not eat or drink well.  She does not consume Of water.  Her BMI is 17.3. -Overall I suspect this is a metabolic issue.  She probably was dehydrated or had poor p.o. intake.  She ruled out for an acute coronary syndrome.  Her EKG has no ischemic change and he has no symptoms  to suggest obstructive CAD.  She has no symptoms concerning for arrhythmia.  She did not have frank syncope.  I think she needs to have her thyroid studies reevaluated and likely  treated.  She also needs to have her B12 likely treated as well.  She is mildly anemic as well which can also contribute to her symptoms.  I also want her to have carcinoid syndrome excluded by her oncologist.  Overall I feel this is more metabolic and not a cardiac issue. -Blood pressure also elevated today.  She is on felodipine.  She did have some high blood pressure when in the hospital.  Possibly this contributed to her symptoms.  They report they will start to check it at home because values have been within normal range on most visits. -We will plan to see her as needed.  I have encouraged him to follow-up with primary care physician and oncologist moving forward.  2. Primary hypertension -Blood pressure elevated.  On felodipine.  They will start to keep a log.  This could have contributed to her symptoms.  We will defer management to primary care physician.  Disposition: Return if symptoms worsen or fail to improve.  Medication Adjustments/Labs and Tests Ordered: Current medicines are reviewed at length with the patient today.  Concerns regarding medicines are outlined above.  No orders of the defined types were placed in this encounter.  No orders of the defined types were placed in this encounter.   Patient Instructions  Medication Instructions:  Your physician recommends that you continue on your current medications as directed. Please refer to the Current Medication list given to you today.  Follow-Up: At [facility], you and your health needs are our priority.  As part of our continuing mission to provide you with exceptional heart care, we have created designated Provider Care Teams.  These Care Teams include your primary Cardiologist (physician) and Advanced Practice Providers (APPs -  Physician Assistants and Nurse Practitioners) who all work together to provide you with the care you need, when you need it.  We recommend signing up for the patient portal called "MyChart".   Sign up information is provided on this After Visit Summary.  MyChart is used to connect with patients for Virtual Visits (Telemedicine).  Patients are able to view lab/test results, encounter notes, upcoming appointments, etc.  Non-urgent messages can be sent to your provider as well.   To learn more about what you can do with MyChart, go to [email or web address].    Your next appointment:   AS NEEDED with Dr. Audie Box       Signed, Addison Naegeli. Audie Box, Iva  [address], [place], [place] [address] [phone]  11/11/2019 11:27 AM

## 2019-11-11 ENCOUNTER — Other Ambulatory Visit: Payer: Self-pay

## 2019-11-11 ENCOUNTER — Encounter: Payer: Self-pay | Admitting: Cardiovascular Disease

## 2019-11-11 ENCOUNTER — Ambulatory Visit: Payer: PPO | Admitting: Cardiovascular Disease

## 2019-11-11 VITALS — BP 174/87 | HR 82 | Ht 62.0 in | Wt 94.8 lb

## 2019-11-11 DIAGNOSIS — I10 Essential (primary) hypertension: Secondary | ICD-10-CM | POA: Diagnosis not present

## 2019-11-11 DIAGNOSIS — R55 Syncope and collapse: Secondary | ICD-10-CM

## 2019-11-11 NOTE — Patient Instructions (Signed)
Medication Instructions:  Your physician recommends that you continue on your current medications as directed. Please refer to the Current Medication list given to you today.  Follow-Up: At [facility], you and your health needs are our priority.  As part of our continuing mission to provide you with exceptional heart care, we have created designated Provider Care Teams.  These Care Teams include your primary Cardiologist (physician) and Advanced Practice Providers (APPs -  Physician Assistants and Nurse Practitioners) who all work together to provide you with the care you need, when you need it.  We recommend signing up for the patient portal called "MyChart".  Sign up information is provided on this After Visit Summary.  MyChart is used to connect with patients for Virtual Visits (Telemedicine).  Patients are able to view lab/test results, encounter notes, upcoming appointments, etc.  Non-urgent messages can be sent to your provider as well.   To learn more about what you can do with MyChart, go to [email or web address].    Your next appointment:   AS NEEDED with Dr. Audie Box

## 2019-11-11 NOTE — Addendum Note (Signed)
Addended by: Wonda Horner on: 11/11/2019 04:06 PM   Modules accepted: Orders

## 2019-12-14 DIAGNOSIS — C510 Malignant neoplasm of labium majus: Secondary | ICD-10-CM | POA: Diagnosis not present

## 2019-12-14 DIAGNOSIS — I10 Essential (primary) hypertension: Secondary | ICD-10-CM | POA: Diagnosis not present

## 2019-12-14 DIAGNOSIS — D3A092 Benign carcinoid tumor of the stomach: Secondary | ICD-10-CM | POA: Diagnosis not present

## 2019-12-14 DIAGNOSIS — E785 Hyperlipidemia, unspecified: Secondary | ICD-10-CM | POA: Diagnosis not present

## 2020-01-04 ENCOUNTER — Encounter (HOSPITAL_COMMUNITY): Payer: Self-pay | Admitting: Emergency Medicine

## 2020-01-04 ENCOUNTER — Emergency Department (HOSPITAL_COMMUNITY)
Admission: EM | Admit: 2020-01-04 | Discharge: 2020-01-04 | Disposition: A | Discharge status: Home / Self Care | Payer: PPO | Attending: Emergency Medicine | Admitting: Emergency Medicine

## 2020-01-04 ENCOUNTER — Other Ambulatory Visit: Payer: Self-pay

## 2020-01-04 DIAGNOSIS — Z79899 Other long term (current) drug therapy: Secondary | ICD-10-CM | POA: Insufficient documentation

## 2020-01-04 DIAGNOSIS — R946 Abnormal results of thyroid function studies: Secondary | ICD-10-CM | POA: Insufficient documentation

## 2020-01-04 DIAGNOSIS — E119 Type 2 diabetes mellitus without complications: Secondary | ICD-10-CM | POA: Insufficient documentation

## 2020-01-04 DIAGNOSIS — R7989 Other specified abnormal findings of blood chemistry: Secondary | ICD-10-CM

## 2020-01-04 DIAGNOSIS — I10 Essential (primary) hypertension: Secondary | ICD-10-CM | POA: Insufficient documentation

## 2020-01-04 DIAGNOSIS — R55 Syncope and collapse: Secondary | ICD-10-CM | POA: Diagnosis not present

## 2020-01-04 LAB — CBC
HCT: 36.7 % (ref 36.0–46.0)
Hemoglobin: 10.5 g/dL — ABNORMAL LOW (ref 12.0–15.0)
MCH: 25.1 pg — ABNORMAL LOW (ref 26.0–34.0)
MCHC: 28.6 g/dL — ABNORMAL LOW (ref 30.0–36.0)
MCV: 87.8 fL (ref 80.0–100.0)
Platelets: 180 10*3/uL (ref 150–400)
RBC: 4.18 MIL/uL (ref 3.87–5.11)
RDW: 15.6 % — ABNORMAL HIGH (ref 11.5–15.5)
WBC: 9.7 10*3/uL (ref 4.0–10.5)
nRBC: 0 % (ref 0.0–0.2)

## 2020-01-04 LAB — BASIC METABOLIC PANEL
Anion gap: 12 (ref 5–15)
BUN: 22 mg/dL (ref 8–23)
CO2: 23 mmol/L (ref 22–32)
Calcium: 9.3 mg/dL (ref 8.9–10.3)
Chloride: 100 mmol/L (ref 98–111)
Creatinine, Ser: 1.24 mg/dL — ABNORMAL HIGH (ref 0.44–1.00)
GFR, Estimated: 40 mL/min — ABNORMAL LOW (ref >60)
Glucose, Bld: 195 mg/dL — ABNORMAL HIGH (ref 70–99)
Potassium: 3.6 mmol/L (ref 3.5–5.1)
Sodium: 135 mmol/L (ref 135–145)

## 2020-01-04 LAB — TSH: TSH: 7.204 u[IU]/mL — ABNORMAL HIGH (ref 0.350–4.500)

## 2020-01-04 LAB — CBG MONITORING, ED: Glucose-Capillary: 195 mg/dL — ABNORMAL HIGH (ref 70–99)

## 2020-01-04 MED ORDER — SODIUM CHLORIDE 0.9 % IV BOLUS
500.0000 mL | Freq: Once | INTRAVENOUS | Status: AC
  Administered 2020-01-04: 17:00:00 500 mL via INTRAVENOUS

## 2020-01-04 NOTE — Discharge Instructions (Signed)
You were seen in the emergency department today for nearly passing out twice. Your blood work looks similar to prior labs you have done. Your TSH was also your thyroid was somewhat elevated, please have this rechecked by your primary care provider, as you may need to be started on thyroid medicine.  Please be sure to stay well-hydrated. Please follow-up with your primary care provider within 48 hours for reevaluation. Return to the ER for any new or worsening symptoms including but not limited to passing out, chest pain, trouble breathing, fever, or any other concerns.

## 2020-01-04 NOTE — ED Triage Notes (Addendum)
Pt's son states that his mother had a near syncopal episode this morning. Son states she perked up when she was given soda. CBG 195 in triage

## 2020-01-04 NOTE — ED Provider Notes (Signed)
[facility] EMERGENCY DEPARTMENT Provider Note   CSN: [id number] Arrival date & time: 01/04/20  1439     History Chief Complaint  Patient presents with  . Near Syncope    [name] is a 86 y.o. female with a hx of hypertension, hyperlipidemia, pernicious anemia, diabetes mellitus, and memory loss who presents to the ED for evaluation of near syncopal episode x 2 this AM. History is primarily provided by patient's son who states that today patient was riding in the car with their cousin when she started to nod off some, did not seem to fully pass out, was given a soda and seemed to perk up by patient's sons arrival. The patient did not want to come to the ED and seemed to be doing okay therefore her son took her to the store, however she had a similar occurrence in the car with him where she seemed to nod off/drool, but did not fully pass out prompting ED visit. Patient's son states hx of same with admission in September of this year. Followed up with cardiologist- no further recommendations that he can recall. No recent med changes. She does not drink much, he frequently has to remind her, she is unsure if she had breakfast today. Level 5 caveat secondary to patient's memory loss which her son states is baseline. She states she feels fine when I ask her how she is doing, she denies lightheadedness, dizziness, chest pain, or dyspnea.   HPI     Past Medical History:  Diagnosis Date  . Adult BMI 19-24 kg/sq m 2008 125 lbs  . Anemia    Pernicious 2008-HB 12 MCV 119.5; JAN 2012 HB 13.2 MCV 97.6  . Carcinoid tumor of stomach 2008  . Diverticulosis   . GERD (gastroesophageal reflux disease)   . Hiatal hernia   . HTN (hypertension)   . Hyperlipidemia   . 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 Pernicious anemia   . Pernicious anemia 06/14/2010  . Wrist fracture, left 2008    Patient Active Problem List   Diagnosis Date Noted  . Carotid disease, bilateral ([place]) 04/19/2014  . Syncope  04/18/2014  . AKI (acute kidney injury) ([place]) 04/18/2014  . 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 Pernicious anemia 06/14/2010  . 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 DM 04/03/2009  . HLD (hyperlipidemia) 04/03/2009  . Essential hypertension 04/03/2009  . GERD 04/03/2009  . WEAKNESS 04/03/2009    Past Surgical History:  Procedure Laterality Date  . COLONOSCOPY  2003 LS   DC/[place] Diverticulosis  . EUS  2008 DJ   FNA NO CARCINOID  . EUS  JAN 2009   NL EXAM, NO Bx  . EUS  MAR 2011 WO   CARCINOID  . UPPER GASTROINTESTINAL ENDOSCOPY  APR 2008 [facility]   CARCIONOID  . UPPER GASTROINTESTINAL ENDOSCOPY  FEB 2011 [facility]   CARCINOID/mild anemia/large hiatal hernia     OB History   No obstetric history on file.     Family History  Problem Relation Age of Onset  . Cancer Mother   . Heart disease Brother   . Colon cancer Neg Hx   . Colon polyps Neg Hx     Social History   Tobacco Use  . Smoking status: Never Smoker  . Smokeless tobacco: Never Used  Vaping Use  . Vaping Use: Never used  Substance Use Topics  . Alcohol use: No  . Drug use: No    Home Medications Prior to Admission medications  Medication Sig Start Date End Date Taking? Authorizing Provider  acetaminophen (TYLENOL) 500 MG tablet Take 500 mg by mouth every 6 (six) hours as needed. For pain    [provider]  calcium carbonate (TUMS - DOSED IN MG ELEMENTAL CALCIUM) 500 MG chewable tablet Chew 2 tablets by mouth daily as needed for indigestion.     [provider]  donepezil (ARICEPT) 5 MG tablet Take 5 mg by mouth 2 (two) times daily.  01/05/19   [provider]  ENSURE PLUS (ENSURE PLUS) LIQD Take 237 mLs by mouth daily.    [provider]  famotidine (PEPCID) 20 MG tablet Take 20 mg by mouth daily. 07/12/19   [provider]  felodipine (PLENDIL) 5 MG 24 hr tablet Take 5 mg by mouth 2 (two) times daily.     [provider]  levothyroxine  (SYNTHROID) 25 MCG tablet Take 1 tablet (25 mcg total) by mouth daily at 6 (six) AM. 09/26/19   Kathie Dike, MD  simvastatin (ZOCOR) 40 MG tablet Take 40 mg by mouth at bedtime.      [provider]  vitamin B-12 (CYANOCOBALAMIN) 1000 MCG tablet Take 1 tablet (1,000 mcg total) by mouth daily. 09/25/19   Kathie Dike, MD    Allergies    Codeine  Review of Systems   Review of Systems  Unable to perform ROS: Age (memory loss)    Physical Exam Vital Signs BP (!) 149/89 (BP Location: Right Arm)   Pulse 88   Temp 98.4 F (36.9 C) (Oral)   Resp 16   Ht 5\' 2"  (1.575 m)   Wt 42.6 kg   SpO2 97%   BMI 17.19 kg/m   Physical Exam Vitals and nursing note reviewed.  Constitutional:      General: She is not in acute distress.    Appearance: She is not toxic-appearing.     Comments: Thin appearing.  HENT:     Head: Normocephalic and atraumatic.     Mouth/Throat:     Mouth: Mucous membranes are dry.  Eyes:     Pupils: Pupils are equal, round, and reactive to light.  Cardiovascular:     Rate and Rhythm: Normal rate and regular rhythm.     Heart sounds: No murmur heard.   Pulmonary:     Effort: Pulmonary effort is normal. No respiratory distress.     Breath sounds: Normal breath sounds.  Abdominal:     General: There is no distension.     Tenderness: There is no abdominal tenderness. There is no guarding or rebound.  Skin:    General: Skin is warm and dry.  Neurological:     Mental Status: She is alert.     Comments: Alert. Clear speech. Sensation grossly intact to bilateral upper/lower extremities.. 5/5 symmetric grip strength & strength with plantar/dorsiflexion bilaterally. Able to lift bilateral LEs off of the stretcher without difficulty. Intact finger to nose. Negative pronator drift.   Psychiatric:        Mood and Affect: Mood normal.        Behavior: Behavior normal.    ED Results / Procedures / Treatments   Labs (all labs ordered are listed, but only  abnormal results are displayed) Labs Reviewed  BASIC METABOLIC PANEL - Abnormal; Notable for the following components:      Result Value   Glucose, Bld 195 (*)    Creatinine, Ser 1.24 (*)    GFR, Estimated 40 (*)    All  other components within normal limits  CBC - Abnormal; Notable for the following components:   Hemoglobin 10.5 (*)    MCH 25.1 (*)    MCHC 28.6 (*)    RDW 15.6 (*)    All other components within normal limits  TSH - Abnormal; Notable for the following components:   TSH 7.204 (*)    All other components within normal limits  CBG MONITORING, ED - Abnormal; Notable for the following components:   Glucose-Capillary 195 (*)    All other components within normal limits    EKG None   Radiology No results found.  Procedures Procedures (including critical care time)  Medications Ordered in ED Medications - No data to display  ED Course  I have reviewed the triage vital signs and the nursing notes.  Pertinent labs & imaging results that were available during my care of the patient were reviewed by me and considered in my medical decision making (see chart for details).    MDM Rules/Calculators/A&P                          Patient presents to the ED for evaluation of near syncope today.  Nontoxic, BP elevated some, vitals otherwise unremarkable.  No focal deficits noted. Heart RRR.   Additional history obtained:  Additional history obtained from chart review & discussing with patient's son. . Previous records obtained and reviewed .  Hospital admission 09/24/19-09/25/19 for syncope- had echocardiogram that was unrevealing for syncope & MRI brain that did not show an acute infract, discharged home, returned to ED same day of discharge and had CTA that was negative for PE.   Lab Tests:  I reviewed and interpreted labs, which included:  CBC, BMP, CBG- similar to prior w/ anemia & renal function.  TSH: elevated, improved from prior.   ED Course:  Orthostatic  VS for the past 24 hrs:  BP- Lying Pulse- Lying BP- Sitting Pulse- Sitting BP- Standing at 0 minutes Pulse- Standing at 0 minutes  01/04/20 1526 175/88 84 168/88 92 167/78 101   Orthostatic VS without significant abnormality. VS fairly baseline for patient- no acute significant change in anemia or renal function. Electrolytes without significant derangement. TSH is elevated, improved from prior, not currently on levothyroxine, will have patient follow up with PCP for this for recheck and possible further management. EKG without significant change compared to prior, cardiac monitoring reassuring. Patient ambulatory int he ED. Recent admission with reassuring syncope evaluation. I discussed results, treatment plan, need for follow-up, and return precautions with the patient & her son @ bedside. Provided opportunity for questions, patient's confirmed understanding and is in agreement with plan.   This is a shared visit with supervising physician Dr. Sedonia Small who has independently evaluated patient & provided guidance in evaluation/management/disposition, in agreement with care    Portions of this note were generated with Dragon dictation software. Dictation errors may occur despite best attempts at proofreading.  Final Clinical Impression(s) / ED Diagnoses Final diagnoses:  Near syncope  Elevated TSH    Rx / DC Orders ED Discharge Orders    None       Leafy Kindle 01/04/20 2103    Maudie Flakes, MD 01/04/20 [phone]

## 2020-01-04 NOTE — ED Notes (Signed)
Pt resting quietly in room with son, on monitor, NAD noted.

## 2020-01-17 ENCOUNTER — Inpatient Hospital Stay (HOSPITAL_COMMUNITY): Payer: PPO | Attending: Hematology

## 2020-01-17 ENCOUNTER — Other Ambulatory Visit: Payer: Self-pay

## 2020-01-17 DIAGNOSIS — D510 Vitamin B12 deficiency anemia due to intrinsic factor deficiency: Secondary | ICD-10-CM

## 2020-01-17 DIAGNOSIS — C7A092 Malignant carcinoid tumor of the stomach: Secondary | ICD-10-CM | POA: Diagnosis not present

## 2020-01-17 DIAGNOSIS — D519 Vitamin B12 deficiency anemia, unspecified: Secondary | ICD-10-CM

## 2020-01-17 DIAGNOSIS — D3A092 Benign carcinoid tumor of the stomach: Secondary | ICD-10-CM

## 2020-01-17 DIAGNOSIS — D509 Iron deficiency anemia, unspecified: Secondary | ICD-10-CM | POA: Insufficient documentation

## 2020-01-17 DIAGNOSIS — E538 Deficiency of other specified B group vitamins: Secondary | ICD-10-CM | POA: Insufficient documentation

## 2020-01-17 LAB — COMPREHENSIVE METABOLIC PANEL
ALT: 10 U/L (ref 0–44)
AST: 20 U/L (ref 15–41)
Albumin: 3.8 g/dL (ref 3.5–5.0)
Alkaline Phosphatase: 74 U/L (ref 38–126)
Anion gap: 9 (ref 5–15)
BUN: 21 mg/dL (ref 8–23)
CO2: 24 mmol/L (ref 22–32)
Calcium: 8.5 mg/dL — ABNORMAL LOW (ref 8.9–10.3)
Chloride: 107 mmol/L (ref 98–111)
Creatinine, Ser: 1.11 mg/dL — ABNORMAL HIGH (ref 0.44–1.00)
GFR, Estimated: 46 mL/min — ABNORMAL LOW (ref >60)
Glucose, Bld: 90 mg/dL (ref 70–99)
Potassium: 3.8 mmol/L (ref 3.5–5.1)
Sodium: 140 mmol/L (ref 135–145)
Total Bilirubin: 0.4 mg/dL (ref 0.3–1.2)
Total Protein: 7.1 g/dL (ref 6.5–8.1)

## 2020-01-17 LAB — IRON AND TIBC
Iron: 12 ug/dL — ABNORMAL LOW (ref 28–170)
Saturation Ratios: 3 % — ABNORMAL LOW (ref 10.4–31.8)
TIBC: 415 ug/dL (ref 250–450)
UIBC: 403 ug/dL

## 2020-01-17 LAB — VITAMIN B12: Vitamin B-12: 240 pg/mL (ref 180–914)

## 2020-01-17 LAB — CBC WITH DIFFERENTIAL/PLATELET
Abs Immature Granulocytes: 0.01 10*3/uL (ref 0.00–0.07)
Basophils Absolute: 0 10*3/uL (ref 0.0–0.1)
Basophils Relative: 0 %
Eosinophils Absolute: 0.1 10*3/uL (ref 0.0–0.5)
Eosinophils Relative: 2 %
HCT: 27.5 % — ABNORMAL LOW (ref 36.0–46.0)
Hemoglobin: 8.1 g/dL — ABNORMAL LOW (ref 12.0–15.0)
Immature Granulocytes: 0 %
Lymphocytes Relative: 16 %
Lymphs Abs: 0.8 10*3/uL (ref 0.7–4.0)
MCH: 25.6 pg — ABNORMAL LOW (ref 26.0–34.0)
MCHC: 29.5 g/dL — ABNORMAL LOW (ref 30.0–36.0)
MCV: 86.8 fL (ref 80.0–100.0)
Monocytes Absolute: 0.5 10*3/uL (ref 0.1–1.0)
Monocytes Relative: 11 %
Neutro Abs: 3.3 10*3/uL (ref 1.7–7.7)
Neutrophils Relative %: 71 %
Platelets: 265 10*3/uL (ref 150–400)
RBC: 3.17 MIL/uL — ABNORMAL LOW (ref 3.87–5.11)
RDW: 15.7 % — ABNORMAL HIGH (ref 11.5–15.5)
WBC: 4.6 10*3/uL (ref 4.0–10.5)
nRBC: 0 % (ref 0.0–0.2)

## 2020-01-17 LAB — FERRITIN: Ferritin: 8 ng/mL — ABNORMAL LOW (ref 11–307)

## 2020-01-18 LAB — CHROMOGRANIN A: Chromogranin A (ng/mL): 813.9 ng/mL — ABNORMAL HIGH (ref 0.0–101.8)

## 2020-01-20 LAB — SEROTONIN SERUM: Serotonin, Serum: 85 ng/mL (ref 0–420)

## 2020-01-25 ENCOUNTER — Inpatient Hospital Stay (HOSPITAL_COMMUNITY): Payer: PPO | Attending: Oncology | Admitting: Oncology

## 2020-01-25 ENCOUNTER — Other Ambulatory Visit: Payer: Self-pay

## 2020-01-25 VITALS — BP 171/74 | HR 84 | Temp 97.2°F | Resp 16 | Wt 94.8 lb

## 2020-01-25 DIAGNOSIS — E538 Deficiency of other specified B group vitamins: Secondary | ICD-10-CM | POA: Diagnosis not present

## 2020-01-25 DIAGNOSIS — D3A092 Benign carcinoid tumor of the stomach: Secondary | ICD-10-CM | POA: Diagnosis not present

## 2020-01-25 DIAGNOSIS — D509 Iron deficiency anemia, unspecified: Secondary | ICD-10-CM | POA: Insufficient documentation

## 2020-01-25 DIAGNOSIS — N2889 Other specified disorders of kidney and ureter: Secondary | ICD-10-CM | POA: Diagnosis not present

## 2020-01-25 DIAGNOSIS — C7A092 Malignant carcinoid tumor of the stomach: Secondary | ICD-10-CM | POA: Diagnosis not present

## 2020-01-25 DIAGNOSIS — D510 Vitamin B12 deficiency anemia due to intrinsic factor deficiency: Secondary | ICD-10-CM | POA: Diagnosis not present

## 2020-01-25 NOTE — Progress Notes (Signed)
[facility] [address], [place] [address]   CLINIC:  Medical Oncology/Hematology  PCP:  Mirna Mires, MD 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 87 y.o. female returns for routine follow-up. She was last seen in clinic on 04/13/2018.  In the interim, she has been seen on several occasions for syncopal episodes.  She was last evaluated at [facility] on 01/04/2020.  Work-up essentially negative.  TSH was elevated and she is currently not taking Synthroid.  She was evaluated by cardiology with no significant findings per son.   Today, she presents with her son.  She reports doing well.  Son states her memory has worsened over the past few months but overall she is doing well.  She has not had any additional syncopal episodes since mid December 2021.  She continues to drive.  She did recently lose her sister whom she spends every day with.  Son thinks she may be depressed.  Denies any nausea, vomiting, or diarrhea. Denies any new pains. Had not noticed any recent bleeding such as epistaxis, hematuria or hematochezia. Denies recent chest pain on exertion, shortness of breath on minimal exertion, pre-syncopal episodes, or palpitations. Denies any numbness or tingling in hands or feet. Denies any recent fevers, infections, or recent hospitalizations. Patient reports appetite at 75% and energy level at 75%.   REVIEW OF SYSTEMS:  Review of Systems  Psychiatric/Behavioral: Positive for depression.       Decline in memory  All other systems reviewed and are negative.    PAST MEDICAL/SURGICAL HISTORY:  Past Medical History:  Diagnosis Date  . Adult BMI 19-24 kg/sq m 2008 125 lbs  . Anemia    Pernicious 2008-HB 12 MCV 119.5; JAN 2012 HB 13.2 MCV 97.6  . Carcinoid tumor of stomach 2008  . Diverticulosis   . GERD  (gastroesophageal reflux disease)   . Hiatal hernia   . HTN (hypertension)   . Hyperlipidemia   . 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 Pernicious anemia   . Pernicious anemia 06/14/2010  . Wrist fracture, left 2008   Past Surgical History:  Procedure Laterality Date  . COLONOSCOPY  2003 LS   DC/[place] Diverticulosis  . EUS  2008 DJ   FNA NO CARCINOID  . EUS  JAN 2009   NL EXAM, NO Bx  . EUS  MAR 2011 WO   CARCINOID  . UPPER GASTROINTESTINAL ENDOSCOPY  APR 2008 [facility]   CARCIONOID  . UPPER GASTROINTESTINAL ENDOSCOPY  FEB 2011 [facility]   CARCINOID/mild anemia/large hiatal hernia     SOCIAL HISTORY:  Social History   Socioeconomic History  . Marital status: Widowed    Spouse name: Not on file  . Number of children: 1  . Years of education: Not on file  . Highest education level: Not on file  Occupational History  . Not on file  Tobacco Use  . Smoking status: Never Smoker  . Smokeless tobacco: Never Used  Vaping Use  . Vaping Use: Never used  Substance and Sexual Activity  . Alcohol use: No  . Drug use: No  . Sexual activity: Not Currently    Birth control/protection: Post-menopausal  Other Topics Concern  . Not on file  Social History Narrative  . Not on file   Social Determinants of Health   Financial Resource Strain: Not  on file  Food Insecurity: Not on file  Transportation Needs: Not on file  Physical Activity: Not on file  Stress: Not on file  Social Connections: Not on file  Intimate Partner Violence: Not on file    FAMILY HISTORY:  Family History  Problem Relation Age of Onset  . Cancer Mother   . Heart disease Brother   . Colon cancer Neg Hx   . Colon polyps Neg Hx     CURRENT MEDICATIONS:  Outpatient Encounter Medications as of 01/25/2020  Medication Sig  . acetaminophen (TYLENOL) 500 MG tablet Take 500 mg by mouth every 6 (six) hours as needed. For pain  . calcium carbonate (TUMS - DOSED IN MG ELEMENTAL CALCIUM) 500 MG chewable tablet Chew 2 tablets by  mouth daily as needed for indigestion.   . famotidine (PEPCID) 20 MG tablet Take 20 mg by mouth daily.  . felodipine (PLENDIL) 5 MG 24 hr tablet Take 5 mg by mouth daily.  Marland Kitchen levothyroxine (SYNTHROID) 25 MCG tablet Take 1 tablet (25 mcg total) by mouth daily at 6 (six) AM. (Patient not taking: Reported on 01/04/2020)  . simvastatin (ZOCOR) 40 MG tablet Take 40 mg by mouth daily.  . vitamin B-12 (CYANOCOBALAMIN) 1000 MCG tablet Take 1 tablet (1,000 mcg total) by mouth daily. (Patient not taking: Reported on 01/04/2020)   No facility-administered encounter medications on file as of 01/25/2020.    ALLERGIES:  Allergies  Allergen Reactions  . Codeine      PHYSICAL EXAM:  ECOG Performance status: 1  There were no vitals filed for this visit. There were no vitals filed for this visit.  Physical Exam Constitutional:      Appearance: Normal appearance.  Cardiovascular:     Rate and Rhythm: Normal rate and regular rhythm.     Heart sounds: Normal heart sounds.  Pulmonary:     Effort: Pulmonary effort is normal.     Breath sounds: Normal breath sounds.  Abdominal:     Palpations: Abdomen is soft. There is no mass.     Tenderness: There is no abdominal tenderness.  Musculoskeletal:        General: No swelling.  Skin:    General: Skin is warm.  Neurological:     General: No focal deficit present.     Mental Status: She is alert and oriented to person, place, and time.  Psychiatric:        Mood and Affect: Mood normal.        Behavior: Behavior normal.      LABORATORY DATA:  I have reviewed the labs as listed.  CBC    Component Value Date/Time   WBC 4.6 01/17/2020 1426   RBC 3.17 (L) 01/17/2020 1426   HGB 8.1 (L) 01/17/2020 1426   HCT 27.5 (L) 01/17/2020 1426   PLT 265 01/17/2020 1426   MCV 86.8 01/17/2020 1426   MCH 25.6 (L) 01/17/2020 1426   MCHC 29.5 (L) 01/17/2020 1426   RDW 15.7 (H) 01/17/2020 1426   LYMPHSABS 0.8 01/17/2020 1426   MONOABS 0.5 01/17/2020 1426    EOSABS 0.1 01/17/2020 1426   BASOSABS 0.0 01/17/2020 1426   CMP Latest Ref Rng & Units 01/17/2020 01/04/2020 09/25/2019  Glucose 70 - 99 mg/dL 90 195(H) 104(H)  BUN 8 - 23 mg/dL 21 22 20   Creatinine 0.44 - 1.00 mg/dL 1.11(H) 1.24(H) 1.27(H)  Sodium 135 - 145 mmol/L 140 135 140  Potassium 3.5 - 5.1 mmol/L 3.8 3.6 4.9  Chloride 98 -  111 mmol/L 107 100 104  CO2 22 - 32 mmol/L 24 23 23   Calcium 8.9 - 10.3 mg/dL 8.5(L) 9.3 9.7  Total Protein 6.5 - 8.1 g/dL 7.1 - -  Total Bilirubin 0.3 - 1.2 mg/dL 0.4 - -  Alkaline Phos 38 - 126 U/L 74 - -  AST 15 - 41 U/L 20 - -  ALT 0 - 44 U/L 10 - -       DIAGNOSTIC IMAGING:  I have independently reviewed the scans and discussed with the patient.   ASSESSMENT & PLAN:  1.  Well-differentiated carcinoid of the stomach: - Biopsy of the stomach in 2011 consistent with carcinoid tumor. - She is being followed with labs every 6 months. - She does not have any clinical signs or symptoms of carcinoid syndrome. - Physical exam was benign.   -Serum chromogranin continues to rise.  -Labs from 01/17/2020 show a chromogranin A level of 813.9 (694.4).  Serum serotonin was normal. -We will get CT abdomen given the rising levels.  -We will see her back in 12 weeks.   2.  B12 deficiency: -She was on monthly B12 shots on 2 08/04/2017. -B12 today was 240.  -Restart monthly B12 injections  3.  Iron deficiency state: -Her hemoglobin has dropped fairly significantly since her visit on 01/04/2020 to the emergency room. -She denies any GI bleeding. -Proceed with 2 doses of IV Feraheme. -RTC in 12 weeks with repeat labs (CBC, CMP, iron, ferritin)  4.  Abnormal imaging: -Work-up for syncope in [facility] discovered several abnormalities including a 6 mm left upper lobe lingular nodule and a heterogeneous solid mass within the left kidney. -It was recommended she have a repeat CT chest in 3 to 6 months and a dedicated ultrasound of her kidneys. -Given her history of  carcinoid tumor of the stomach with rising chromogranin A levels will get CT CAP in the next couple weeks.  -We will also get a renal ultrasound. -Orders placed.  Disposition: -IV Feraheme x2 -B12 injections monthly -CT CAP  -Renal ultrasound -RTC in 12 weeks with repeat lab work (CBC, CMP, ferritin, iron) and assessment.  No problem-specific Assessment & Plan notes found for this encounter.  Orders placed this encounter:  No orders of the defined types were placed in this encounter.  Faythe Casa, NP 01/26/2020 8:55 AM\  [place] [phone]

## 2020-01-26 DIAGNOSIS — D509 Iron deficiency anemia, unspecified: Secondary | ICD-10-CM | POA: Insufficient documentation

## 2020-01-28 ENCOUNTER — Encounter (HOSPITAL_COMMUNITY): Payer: Self-pay

## 2020-01-28 ENCOUNTER — Other Ambulatory Visit: Payer: Self-pay

## 2020-01-28 ENCOUNTER — Inpatient Hospital Stay (HOSPITAL_COMMUNITY): Payer: PPO

## 2020-01-28 VITALS — BP 147/75 | HR 80 | Temp 97.2°F | Resp 16

## 2020-01-28 DIAGNOSIS — D510 Vitamin B12 deficiency anemia due to intrinsic factor deficiency: Secondary | ICD-10-CM

## 2020-01-28 DIAGNOSIS — D509 Iron deficiency anemia, unspecified: Secondary | ICD-10-CM | POA: Diagnosis not present

## 2020-01-28 MED ORDER — SODIUM CHLORIDE 0.9 % IV SOLN
510.0000 mg | Freq: Once | INTRAVENOUS | Status: AC
  Administered 2020-01-28: 510 mg via INTRAVENOUS
  Filled 2020-01-28: qty 510

## 2020-01-28 MED ORDER — SODIUM CHLORIDE 0.9 % IV SOLN: Freq: Once | INTRAVENOUS | Status: AC

## 2020-01-28 NOTE — Progress Notes (Signed)
Tolerated Feraheme infusion w/o adverse reaction.  Alert, in no distress.  VSS.  Discharged ambulatory in stable condition.  

## 2020-02-01 ENCOUNTER — Ambulatory Visit (HOSPITAL_COMMUNITY)
Admission: RE | Admit: 2020-02-01 | Discharge: 2020-02-01 | Disposition: A | Discharge status: Home / Self Care | Payer: PPO | Source: Ambulatory Visit | Attending: Oncology | Admitting: Oncology

## 2020-02-01 ENCOUNTER — Other Ambulatory Visit: Payer: Self-pay

## 2020-02-01 DIAGNOSIS — N2889 Other specified disorders of kidney and ureter: Secondary | ICD-10-CM | POA: Diagnosis not present

## 2020-02-01 DIAGNOSIS — N281 Cyst of kidney, acquired: Secondary | ICD-10-CM | POA: Diagnosis not present

## 2020-02-04 ENCOUNTER — Other Ambulatory Visit: Payer: Self-pay

## 2020-02-04 ENCOUNTER — Inpatient Hospital Stay (HOSPITAL_COMMUNITY): Payer: PPO

## 2020-02-04 VITALS — BP 174/64 | HR 83 | Temp 97.0°F | Resp 16

## 2020-02-04 DIAGNOSIS — D509 Iron deficiency anemia, unspecified: Secondary | ICD-10-CM

## 2020-02-04 DIAGNOSIS — D510 Vitamin B12 deficiency anemia due to intrinsic factor deficiency: Secondary | ICD-10-CM

## 2020-02-04 MED ORDER — SODIUM CHLORIDE 0.9 % IV SOLN
510.0000 mg | Freq: Once | INTRAVENOUS | Status: AC
  Administered 2020-02-04: 510 mg via INTRAVENOUS
  Filled 2020-02-04: qty 510

## 2020-02-04 MED ORDER — SODIUM CHLORIDE 0.9 % IV SOLN: Freq: Once | INTRAVENOUS | Status: AC

## 2020-02-08 ENCOUNTER — Ambulatory Visit (HOSPITAL_COMMUNITY)
Admission: RE | Admit: 2020-02-08 | Discharge: 2020-02-08 | Disposition: A | Discharge status: Home / Self Care | Payer: PPO | Source: Ambulatory Visit | Attending: Oncology | Admitting: Oncology

## 2020-02-08 ENCOUNTER — Other Ambulatory Visit: Payer: Self-pay

## 2020-02-08 DIAGNOSIS — K449 Diaphragmatic hernia without obstruction or gangrene: Secondary | ICD-10-CM | POA: Diagnosis not present

## 2020-02-08 DIAGNOSIS — D259 Leiomyoma of uterus, unspecified: Secondary | ICD-10-CM | POA: Diagnosis not present

## 2020-02-08 DIAGNOSIS — I2510 Atherosclerotic heart disease of native coronary artery without angina pectoris: Secondary | ICD-10-CM | POA: Diagnosis not present

## 2020-02-08 DIAGNOSIS — K8020 Calculus of gallbladder without cholecystitis without obstruction: Secondary | ICD-10-CM | POA: Diagnosis not present

## 2020-02-08 DIAGNOSIS — D3A092 Benign carcinoid tumor of the stomach: Secondary | ICD-10-CM | POA: Diagnosis not present

## 2020-02-08 DIAGNOSIS — Q279 Congenital malformation of peripheral vascular system, unspecified: Secondary | ICD-10-CM | POA: Diagnosis not present

## 2020-02-08 DIAGNOSIS — K5730 Diverticulosis of large intestine without perforation or abscess without bleeding: Secondary | ICD-10-CM | POA: Diagnosis not present

## 2020-02-08 DIAGNOSIS — M47816 Spondylosis without myelopathy or radiculopathy, lumbar region: Secondary | ICD-10-CM | POA: Diagnosis not present

## 2020-02-08 MED ORDER — IOHEXOL 300 MG/ML  SOLN
75.0000 mL | Freq: Once | INTRAMUSCULAR | Status: AC | PRN
  Administered 2020-02-08: 75 mL via INTRAVENOUS

## 2020-02-10 ENCOUNTER — Encounter: Payer: Self-pay | Admitting: Internal Medicine

## 2020-02-10 ENCOUNTER — Other Ambulatory Visit: Payer: Self-pay

## 2020-02-10 ENCOUNTER — Ambulatory Visit (INDEPENDENT_AMBULATORY_CARE_PROVIDER_SITE_OTHER): Payer: PPO | Admitting: Internal Medicine

## 2020-02-10 VITALS — BP 150/88 | HR 68 | Temp 97.9°F | Ht 62.0 in | Wt 95.0 lb

## 2020-02-10 DIAGNOSIS — Z7689 Persons encountering health services in other specified circumstances: Secondary | ICD-10-CM

## 2020-02-10 DIAGNOSIS — E039 Hypothyroidism, unspecified: Secondary | ICD-10-CM

## 2020-02-10 DIAGNOSIS — D3A092 Benign carcinoid tumor of the stomach: Secondary | ICD-10-CM | POA: Diagnosis not present

## 2020-02-10 DIAGNOSIS — I10 Essential (primary) hypertension: Secondary | ICD-10-CM

## 2020-02-10 DIAGNOSIS — K219 Gastro-esophageal reflux disease without esophagitis: Secondary | ICD-10-CM | POA: Diagnosis not present

## 2020-02-10 DIAGNOSIS — E785 Hyperlipidemia, unspecified: Secondary | ICD-10-CM | POA: Diagnosis not present

## 2020-02-10 DIAGNOSIS — D509 Iron deficiency anemia, unspecified: Secondary | ICD-10-CM | POA: Diagnosis not present

## 2020-02-10 DIAGNOSIS — R55 Syncope and collapse: Secondary | ICD-10-CM

## 2020-02-10 DIAGNOSIS — R911 Solitary pulmonary nodule: Secondary | ICD-10-CM

## 2020-02-10 DIAGNOSIS — N2889 Other specified disorders of kidney and ureter: Secondary | ICD-10-CM | POA: Diagnosis not present

## 2020-02-10 MED ORDER — LEVOTHYROXINE SODIUM 25 MCG PO TABS: 25.0000 ug | ORAL_TABLET | Freq: Every day | ORAL | 3 refills | Status: DC

## 2020-02-10 MED ORDER — FELODIPINE ER 5 MG PO TB24
5.0000 mg | ORAL_TABLET | Freq: Every day | ORAL | 1 refills | Status: DC
Start: 2020-02-10 — End: 2020-04-06

## 2020-02-10 NOTE — Assessment & Plan Note (Signed)
With B12 deficiency On iron infusions and Vitamin B12 monthly injections Check CBC before the next visit

## 2020-02-10 NOTE — Assessment & Plan Note (Signed)
Likely due to dehydration due to poor PO intake Advised to stay hydrated and take small, frequent meals to avoid hypoglycemia EKG and Echo unremarkable, had Cardiology evaluation as well Started Levothyroxine for hypothyroidism On iron and Vitamin B12 injections

## 2020-02-10 NOTE — Assessment & Plan Note (Signed)
Lab Results  Component Value Date   TSH 7.204 (H) 01/04/2020   Recent episodes of near-syncope, could be due to dehydration as well Start Levothyroxine 25 mcg QD Check TSH before the next visit

## 2020-02-10 NOTE — Assessment & Plan Note (Signed)
Benign, rising chromogranin - 813.9 Serum serotonin normal CT abdomen recently did not show any new gastric mass.

## 2020-02-10 NOTE — Assessment & Plan Note (Signed)
Care established Previous chart reviewed History and medications reviewed with the patient 

## 2020-02-10 NOTE — Assessment & Plan Note (Signed)
On Pepcid 

## 2020-02-10 NOTE — Assessment & Plan Note (Signed)
Multiple renal mass, cystic - could be RCC according to CT abdomen Follows up with Oncology

## 2020-02-10 NOTE — Progress Notes (Signed)
New Patient Office Visit  Subjective:  Patient ID: [name], female    DOB: 12/03/1922  Age: 87 y.o. MRN: [id number]  CC:  Chief Complaint  Patient presents with  . New Patient (Initial Visit)    Here to establish care. No complaints today.    HPI [name] is a 87 year old female with PMH of HTN, benign neuroendocrine carcinoid tumor of stomach, iron deficiency anemia, vitamin B12 deficiency and malnutrition who presents for establishing care. She is a former patient of Dr Berdine Addison. She presents with her son, who helped with the history.  She recently went to ER for evaluation of syncope. Son reports h/o multiple near-syncope events in the last few months.  She denies any dizziness, chest pain, or palpitations before the episodes.  Her EKG and Echo were unremarkable.  Of note, son mentions that she has very poor p.o. intake and does not take water much.  She follows up with oncology at [facility] for history of neuroendocrine carcinoid tumor of stomach and iron deficiency anemia.  Chart review suggests rising chromogranin levels, for which she had CT chest abdomen done recently.  CT abdomen showed multiple cystic renal lesions, which could be due to [place].  She has a follow-up appointment with Oncology. She has been getting iron infusions and Vitamin B12 injections as well.  Her blood pressure was 150/88 in the office today.  Of note, she recently ran out of her felodipine.  She denies any headache, dizziness, chest pain, dyspnea or palpitations.  She is independent with her ADLs and IADLs.  She is up-to-date with COVID and flu vaccine.  Past Medical History:  Diagnosis Date  . Adult BMI 19-24 kg/sq m 2008 125 lbs  . Anemia    Pernicious 2008-HB 12 MCV 119.5; JAN 2012 HB 13.2 MCV 97.6  . Carcinoid tumor of stomach 2008  . Diverticulosis   . GERD (gastroesophageal reflux disease)   . Hiatal hernia   . HTN (hypertension)   . Hyperlipidemia   . Hypertension     Phreesia 02/07/2020  . 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 Pernicious anemia   . Pernicious anemia 06/14/2010  . Wrist fracture, left 2008    Past Surgical History:  Procedure Laterality Date  . COLONOSCOPY  2003 LS   DC/[place] Diverticulosis  . EUS  2008 DJ   FNA NO CARCINOID  . EUS  JAN 2009   NL EXAM, NO Bx  . EUS  MAR 2011 WO   CARCINOID  . UPPER GASTROINTESTINAL ENDOSCOPY  APR 2008 [facility]   CARCIONOID  . UPPER GASTROINTESTINAL ENDOSCOPY  FEB 2011 [facility]   CARCINOID/mild anemia/large hiatal hernia    Family History  Problem Relation Age of Onset  . Cancer Mother   . Heart disease Brother   . Colon cancer Neg Hx   . Colon polyps Neg Hx     Social History   Socioeconomic History  . Marital status: Widowed    Spouse name: Not on file  . Number of children: 1  . Years of education: Not on file  . Highest education level: Not on file  Occupational History  . Not on file  Tobacco Use  . Smoking status: Never Smoker  . Smokeless tobacco: Never Used  Vaping Use  . Vaping Use: Never used  Substance and Sexual Activity  . Alcohol use: No  . Drug use: No  . Sexual activity: Not Currently    Birth control/protection: Post-menopausal  Other Topics  Concern  . Not on file  Social History Narrative  . Not on file   Social Determinants of Health   Financial Resource Strain: Not on file  Food Insecurity: Not on file  Transportation Needs: Not on file  Physical Activity: Not on file  Stress: Not on file  Social Connections: Not on file  Intimate Partner Violence: Not on file    ROS Review of Systems  Constitutional: Negative for chills and fever.  HENT: Negative for congestion, sinus pressure, sinus pain and sore throat.   Eyes: Negative for pain and discharge.  Respiratory: Negative for cough and shortness of breath.   Cardiovascular: Negative for chest pain and palpitations.  Gastrointestinal: Negative for abdominal pain, constipation, diarrhea, nausea and vomiting.   Endocrine: Negative for polydipsia and polyuria.  Genitourinary: Negative for dysuria and hematuria.  Musculoskeletal: Negative for neck pain and neck stiffness.  Skin: Negative for rash.  Neurological: Positive for syncope. Negative for dizziness, weakness and headaches.  Psychiatric/Behavioral: Negative for agitation and behavioral problems.    Objective:   Today's Vitals: BP (!) 150/88 (BP Location: Left Arm, Patient Position: Sitting, Cuff Size: Normal)   Pulse 68   Temp 97.9 F (36.6 C) (Temporal)   Ht 5' 2"  (1.575 m)   Wt 95 lb (43.1 kg)   SpO2 99%   BMI 17.38 kg/m   Physical Exam Vitals reviewed.  Constitutional:      General: She is not in acute distress.    Appearance: She is not diaphoretic.  HENT:     Head: Normocephalic and atraumatic.     Nose: Nose normal.     Mouth/Throat:     Mouth: Mucous membranes are moist.  Eyes:     General: No scleral icterus.    Extraocular Movements: Extraocular movements intact.     Pupils: Pupils are equal, round, and reactive to light.  Cardiovascular:     Rate and Rhythm: Normal rate and regular rhythm.     Pulses: Normal pulses.     Heart sounds: Normal heart sounds. No murmur heard.   Pulmonary:     Breath sounds: Normal breath sounds. No wheezing or rales.  Abdominal:     Palpations: Abdomen is soft.     Tenderness: There is no abdominal tenderness.  Musculoskeletal:     Cervical back: Neck supple. No tenderness.     Right lower leg: No edema.     Left lower leg: No edema.  Skin:    General: Skin is warm.     Findings: No rash.  Neurological:     General: No focal deficit present.     Mental Status: She is alert and oriented to person, place, and time.     Sensory: No sensory deficit.     Motor: No weakness.  Psychiatric:        Mood and Affect: Mood normal.        Behavior: Behavior normal.     Assessment & Plan:   Problem List Items Addressed This Visit      Encounter to establish care - Primary    Care established Previous chart reviewed History and medications reviewed with the patient     Relevant Orders  CBC with Differential  CMP14+EGFR  Hemoglobin A1c  TSH + free T4  Vitamin D (25 hydroxy)    Cardiovascular and Mediastinum   Essential hypertension    BP Readings from Last 1 Encounters:  02/10/20 (!) 150/88   Elevated as she has run out  of her Felodipine Refilled Felodipine Counseled for compliance with the medications      Relevant Medications   felodipine (PLENDIL) 5 MG 24 hr tablet   Near syncope    Likely due to dehydration due to poor PO intake Advised to stay hydrated and take small, frequent meals to avoid hypoglycemia EKG and Echo unremarkable, had Cardiology evaluation as well Started Levothyroxine for hypothyroidism On iron and Vitamin B12 injections      Relevant Medications   felodipine (PLENDIL) 5 MG 24 hr tablet     Digestive   Neuroendocrine carcinoid tumor of stomach    Benign, rising chromogranin - 813.9 Serum serotonin normal CT abdomen recently did not show any new gastric mass.      GERD    On Pepcid        Endocrine   Hypothyroidism    Lab Results  Component Value Date   TSH 7.204 (H) 01/04/2020   Recent episodes of near-syncope, could be due to dehydration as well Start Levothyroxine 25 mcg QD Check TSH before the next visit      Relevant Medications   levothyroxine (SYNTHROID) 25 MCG tablet     Other   HLD (hyperlipidemia)    On Simvastatin      Relevant Medications   felodipine (PLENDIL) 5 MG 24 hr tablet   Iron deficiency anemia    With B12 deficiency On iron infusions and Vitamin B12 monthly injections Check CBC before the next visit      Renal mass    Multiple renal mass, cystic - could be RCC according to CT abdomen Follows up with Oncology      Pulmonary nodule    Less than 1 cm seen on CT chest (01/2020) Has h/o neuroendocrine tumor Check CT chest after 6 months       Outpatient Encounter  Medications as of 02/10/2020  Medication Sig  . acetaminophen (TYLENOL) 500 MG tablet Take 500 mg by mouth every 6 (six) hours as needed. For pain  . famotidine (PEPCID) 20 MG tablet Take 20 mg by mouth daily.  Marland Kitchen levothyroxine (SYNTHROID) 25 MCG tablet Take 1 tablet (25 mcg total) by mouth daily before breakfast.  . simvastatin (ZOCOR) 40 MG tablet Take 40 mg by mouth daily.  . [DISCONTINUED] felodipine (PLENDIL) 5 MG 24 hr tablet Take 5 mg by mouth daily.  . felodipine (PLENDIL) 5 MG 24 hr tablet Take 1 tablet (5 mg total) by mouth daily.  . [DISCONTINUED] calcium carbonate (TUMS - DOSED IN MG ELEMENTAL CALCIUM) 500 MG chewable tablet Chew 2 tablets by mouth daily as needed for indigestion.  (Patient not taking: No sig reported)   No facility-administered encounter medications on file as of 02/10/2020.    Follow-up: Return in about 8 weeks (around 04/06/2020).   [name], MD

## 2020-02-10 NOTE — Assessment & Plan Note (Signed)
BP Readings from Last 1 Encounters:  02/10/20 (!) 150/88   Elevated as she has run out of her Felodipine Refilled Felodipine Counseled for compliance with the medications

## 2020-02-10 NOTE — Assessment & Plan Note (Signed)
On Simvastatin 

## 2020-02-10 NOTE — Assessment & Plan Note (Signed)
Less than 1 cm seen on CT chest (01/2020) Has h/o neuroendocrine tumor Check CT chest after 6 months

## 2020-02-10 NOTE — Patient Instructions (Signed)
Please start taking Levothyroxine 25 mcg once daily.  Please continue to take medications as prescribed.  Please continue to follow up with Oncology.  Please try to take at least 1.5 liters of fluid in a day to avoid dehydration. Please take small, frequent meals to avoid risk of hypoglycemia.

## 2020-02-18 ENCOUNTER — Other Ambulatory Visit: Payer: Self-pay

## 2020-02-18 ENCOUNTER — Telehealth (INDEPENDENT_AMBULATORY_CARE_PROVIDER_SITE_OTHER): Payer: PPO

## 2020-02-18 VITALS — Ht 62.0 in | Wt 95.0 lb

## 2020-02-18 DIAGNOSIS — Z0000 Encounter for general adult medical examination without abnormal findings: Secondary | ICD-10-CM | POA: Diagnosis not present

## 2020-02-18 NOTE — Patient Instructions (Addendum)
[name] , Thank you for taking time to come for your Medicare Wellness Visit. I appreciate your ongoing commitment to your health goals. Please review the following plan we discussed and let me know if I can assist you in the future.   Screening recommendations/referrals: Colonoscopy: Complete Mammogram: Complete Bone Density: Complete Recommended yearly ophthalmology/optometry visit for glaucoma screening and checkup Recommended yearly dental visit for hygiene and checkup  Vaccinations: Influenza vaccine: Fall 2022 Pneumococcal vaccine: Complete Tdap vaccine: Complete Shingles vaccine: Declined  Advanced directives: No  Conditions/risks identified: None  Next appointment: 04/06/20 @ 10:40 am   Preventive Care 65 Years and Older, Female Preventive care refers to lifestyle choices and visits with your health care provider that can promote health and wellness. What does preventive care include?  A yearly physical exam. This is also called an annual well check.  Dental exams once or twice a year.  Routine eye exams. Ask your health care provider how often you should have your eyes checked.  Personal lifestyle choices, including:  Daily care of your teeth and gums.  Regular physical activity.  Eating a healthy diet.  Avoiding tobacco and drug use.  Limiting alcohol use.  Practicing safe sex.  Taking low-dose aspirin every day.  Taking vitamin and mineral supplements as recommended by your health care provider. What happens during an annual well check? The services and screenings done by your health care provider during your annual well check will depend on your age, overall health, lifestyle risk factors, and family history of disease. Counseling  Your health care provider may ask you questions about your:  Alcohol use.  Tobacco use.  Drug use.  Emotional well-being.  Home and relationship well-being.  Sexual activity.  Eating habits.  History of  falls.  Memory and ability to understand (cognition).  Work and work Statistician.  Reproductive health. Screening  You may have the following tests or measurements:  Height, weight, and BMI.  Blood pressure.  Lipid and cholesterol levels. These may be checked every 5 years, or more frequently if you are over 40 years old.  Skin check.  Lung cancer screening. You may have this screening every year starting at age 56 if you have a 30-pack-year history of smoking and currently smoke or have quit within the past 15 years.  Fecal occult blood test (FOBT) of the stool. You may have this test every year starting at age 77.  Flexible sigmoidoscopy or colonoscopy. You may have a sigmoidoscopy every 5 years or a colonoscopy every 10 years starting at age 49.  Hepatitis C blood test.  Hepatitis B blood test.  Sexually transmitted disease (STD) testing.  Diabetes screening. This is done by checking your blood sugar (glucose) after you have not eaten for a while (fasting). You may have this done every 1-3 years.  Bone density scan. This is done to screen for osteoporosis. You may have this done starting at age 18.  Mammogram. This may be done every 1-2 years. Talk to your health care provider about how often you should have regular mammograms. Talk with your health care provider about your test results, treatment options, and if necessary, the need for more tests. Vaccines  Your health care provider may recommend certain vaccines, such as:  Influenza vaccine. This is recommended every year.  Tetanus, diphtheria, and acellular pertussis (Tdap, Td) vaccine. You may need a Td booster every 10 years.  Zoster vaccine. You may need this after age 30.  Pneumococcal 13-valent conjugate (PCV13)  vaccine. One dose is recommended after age 86.  Pneumococcal polysaccharide (PPSV23) vaccine. One dose is recommended after age 63. Talk to your health care provider about which screenings and  vaccines you need and how often you need them. This information is not intended to replace advice given to you by your health care provider. Make sure you discuss any questions you have with your health care provider. Document Released: 02/03/2015 Document Revised: 09/27/2015 Document Reviewed: 11/08/2014 Elsevier Interactive Patient Education  2017 [place] Prevention in the Home Falls can cause injuries. They can happen to people of all ages. There are many things you can do to make your home safe and to help prevent falls. What can I do on the outside of my home?  Regularly fix the edges of walkways and driveways and fix any cracks.  Remove anything that might make you trip as you walk through a door, such as a raised step or threshold.  Trim any bushes or trees on the path to your home.  Use bright outdoor lighting.  Clear any walking paths of anything that might make someone trip, such as rocks or tools.  Regularly check to see if handrails are loose or broken. Make sure that both sides of any steps have handrails.  Any raised decks and porches should have guardrails on the edges.  Have any leaves, snow, or ice cleared regularly.  Use sand or salt on walking paths during winter.  Clean up any spills in your garage right away. This includes oil or grease spills. What can I do in the bathroom?  Use night lights.  Install grab bars by the toilet and in the tub and shower. Do not use towel bars as grab bars.  Use non-skid mats or decals in the tub or shower.  If you need to sit down in the shower, use a plastic, non-slip stool.  Keep the floor dry. Clean up any water that spills on the floor as soon as it happens.  Remove soap buildup in the tub or shower regularly.  Attach bath mats securely with double-sided non-slip rug tape.  Do not have throw rugs and other things on the floor that can make you trip. What can I do in the bedroom?  Use night  lights.  Make sure that you have a light by your bed that is easy to reach.  Do not use any sheets or blankets that are too big for your bed. They should not hang down onto the floor.  Have a firm chair that has side arms. You can use this for support while you get dressed.  Do not have throw rugs and other things on the floor that can make you trip. What can I do in the kitchen?  Clean up any spills right away.  Avoid walking on wet floors.  Keep items that you use a lot in easy-to-reach places.  If you need to reach something above you, use a strong step stool that has a grab bar.  Keep electrical cords out of the way.  Do not use floor polish or wax that makes floors slippery. If you must use wax, use non-skid floor wax.  Do not have throw rugs and other things on the floor that can make you trip. What can I do with my stairs?  Do not leave any items on the stairs.  Make sure that there are handrails on both sides of the stairs and use them. Fix handrails that are  broken or loose. Make sure that handrails are as long as the stairways.  Check any carpeting to make sure that it is firmly attached to the stairs. Fix any carpet that is loose or worn.  Avoid having throw rugs at the top or bottom of the stairs. If you do have throw rugs, attach them to the floor with carpet tape.  Make sure that you have a light switch at the top of the stairs and the bottom of the stairs. If you do not have them, ask someone to add them for you. What else can I do to help prevent falls?  Wear shoes that:  Do not have high heels.  Have rubber bottoms.  Are comfortable and fit you well.  Are closed at the toe. Do not wear sandals.  If you use a stepladder:  Make sure that it is fully opened. Do not climb a closed stepladder.  Make sure that both sides of the stepladder are locked into place.  Ask someone to hold it for you, if possible.  Clearly mark and make sure that you can  see:  Any grab bars or handrails.  First and last steps.  Where the edge of each step is.  Use tools that help you move around (mobility aids) if they are needed. These include:  Canes.  Walkers.  Scooters.  Crutches.  Turn on the lights when you go into a dark area. Replace any light bulbs as soon as they burn out.  Set up your furniture so you have a clear path. Avoid moving your furniture around.  If any of your floors are uneven, fix them.  If there are any pets around you, be aware of where they are.  Review your medicines with your doctor. Some medicines can make you feel dizzy. This can increase your chance of falling. Ask your doctor what other things that you can do to help prevent falls. This information is not intended to replace advice given to you by your health care provider. Make sure you discuss any questions you have with your health care provider. Document Released: 11/03/2008 Document Revised: 06/15/2015 Document Reviewed: 02/11/2014 Elsevier Interactive Patient Education  2017 [employer].

## 2020-02-18 NOTE — Progress Notes (Signed)
Subjective:   [name] is a 87 y.o. female who presents for Medicare Annual (Subsequent) preventive examination.  Review of Systems       Objective:    There were no vitals filed for this visit. There is no height or weight on file to calculate BMI.  Advanced Directives 01/28/2020 01/25/2020 01/04/2020 09/25/2019 09/24/2019 09/24/2019 01/09/2019  Does Patient Have a Medical Advance Directive? Yes Yes No No No No Unable to assess, patient is non-responsive or altered mental status  Type of Scientist, forensic Power of [place];Living will Living will;Healthcare Power of Attorney - - - - -  Does patient want to make changes to medical advance directive? - No - Patient declined - - - - -  Copy of [place] in Chart? No - copy requested No - copy requested - - - - -  Would patient like information on creating a medical advance directive? No - Patient declined No - Patient declined No - Patient declined - No - Patient declined No - Patient declined -    Current Medications (verified) Outpatient Encounter Medications as of 02/18/2020  Medication Sig  . acetaminophen (TYLENOL) 500 MG tablet Take 500 mg by mouth every 6 (six) hours as needed. For pain  . famotidine (PEPCID) 20 MG tablet Take 20 mg by mouth daily.  . felodipine (PLENDIL) 5 MG 24 hr tablet Take 1 tablet (5 mg total) by mouth daily.  Marland Kitchen levothyroxine (SYNTHROID) 25 MCG tablet Take 1 tablet (25 mcg total) by mouth daily before breakfast.  . simvastatin (ZOCOR) 40 MG tablet Take 40 mg by mouth daily.   No facility-administered encounter medications on file as of 02/18/2020.    Allergies (verified) Codeine   History: Past Medical History:  Diagnosis Date  . Adult BMI 19-24 kg/sq m 2008 125 lbs  . Anemia    Pernicious 2008-HB 12 MCV 119.5; JAN 2012 HB 13.2 MCV 97.6  . Carcinoid tumor of stomach 2008  . Diverticulosis   . GERD (gastroesophageal reflux disease)   . Hiatal hernia   . HTN  (hypertension)   . Hyperlipidemia   . Hypertension    Phreesia 02/07/2020  . 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 Pernicious anemia   . Pernicious anemia 06/14/2010  . Wrist fracture, left 2008   Past Surgical History:  Procedure Laterality Date  . COLONOSCOPY  2003 LS   DC/[place] Diverticulosis  . EUS  2008 DJ   FNA NO CARCINOID  . EUS  JAN 2009   NL EXAM, NO Bx  . EUS  MAR 2011 WO   CARCINOID  . UPPER GASTROINTESTINAL ENDOSCOPY  APR 2008 [facility]   CARCIONOID  . UPPER GASTROINTESTINAL ENDOSCOPY  FEB 2011 [facility]   CARCINOID/mild anemia/large hiatal hernia   Family History  Problem Relation Age of Onset  . Cancer Mother   . Heart disease Brother   . Colon cancer Neg Hx   . Colon polyps Neg Hx    Social History   Socioeconomic History  . Marital status: Widowed    Spouse name: Not on file  . Number of children: 1  . Years of education: Not on file  . Highest education level: Not on file  Occupational History  . Not on file  Tobacco Use  . Smoking status: Never Smoker  . Smokeless tobacco: Never Used  Vaping Use  . Vaping Use: Never used  Substance and Sexual Activity  . Alcohol use: No  . Drug use:  No  . Sexual activity: Not Currently    Birth control/protection: Post-menopausal  Other Topics Concern  . Not on file  Social History Narrative  . Not on file   Social Determinants of Health   Financial Resource Strain: Not on file  Food Insecurity: Not on file  Transportation Needs: Not on file  Physical Activity: Not on file  Stress: Not on file  Social Connections: Not on file    Tobacco Counseling Counseling given: Not Answered   Clinical Intake:                 Diabetic? no         Activities of Daily Living In your present state of health, do you have any difficulty performing the following activities: 09/24/2019  Hearing? N  Vision? N  Difficulty concentrating or making decisions? N  Walking or climbing stairs? N  Dressing or bathing? N   Doing errands, shopping? N  Some recent data might be hidden    Patient Care Team: [name], MD as PCP - General (Internal Medicine) Danie Binder, MD (Inactive) (Gastroenterology)  Indicate any recent Medical Services you may have received from other than [facility] providers in the past year (date may be approximate).     Assessment:   This is a routine wellness examination for [place].  Hearing/Vision screen No exam data present  Dietary issues and exercise activities discussed:    Goals   None    Depression Screen PHQ 2/9 Scores 02/10/2020 12/02/2013 10/07/2013  PHQ - 2 Score 0 0 0    Fall Risk Fall Risk  02/10/2020 08/17/2019 12/16/2018 01/27/2014 12/29/2013  Falls in the past year? 0 0 0 No No  Comment - [facility] Telephone Survey: data to providers prior to load [employer] Telephone Survey: data to providers prior to load - -  Number falls in past yr: 0 - - - -  Injury with Fall? 0 - - - -  Risk for fall due to : No Fall Risks - - - -  Follow up Falls evaluation completed - - - -    FALL RISK PREVENTION PERTAINING TO THE HOME:  Any stairs in or around the home? No  If so, are there any without handrails? No  Home free of loose throw rugs in walkways, pet beds, electrical cords, etc? Yes  Adequate lighting in your home to reduce risk of falls? Yes   ASSISTIVE DEVICES UTILIZED TO PREVENT FALLS:  Life alert? No  Use of a cane, walker or w/c? No  Grab bars in the bathroom? Yes  Shower chair or bench in shower? Yes  Elevated toilet seat or a handicapped toilet? Yes   TIMED UP AND GO:  Was the test performed? No .  Length of time to ambulate n/a    Cognitive Function:        Immunizations Immunization History  Administered Date(s) Administered  . Influenza-Unspecified 10/23/2016, 10/21/2017  . PFIZER(Purple Top)SARS-COV-2 Vaccination 03/18/2019, 04/14/2019     TDAP status: Due, Education has been provided regarding the importance of this vaccine. Advised may  receive this vaccine at local pharmacy or Health Dept. Aware to provide a copy of the vaccination record if obtained from local pharmacy or Health Dept. Verbalized acceptance and understanding.  Flu: Complete  Pneumococcal vaccine status: Declined,  Education has been provided regarding the importance of this vaccine but patient still declined. Advised may receive this vaccine at local pharmacy or Health Dept. Aware to provide a  copy of the vaccination record if obtained from local pharmacy or Health Dept. Verbalized acceptance and understanding.   Covid-19 vaccine status: Completed vaccines  Qualifies for Shingles Vaccine? Yes   Zostavax completed No   Shingrix Completed?: No.    Education has been provided regarding the importance of this vaccine. Patient has been advised to call insurance company to determine out of pocket expense if they have not yet received this vaccine. Advised may also receive vaccine at local pharmacy or Health Dept. Verbalized acceptance and understanding.  Screening Tests Health Maintenance  Topic Date Due  . HEMOGLOBIN A1C  Never done  . TETANUS/TDAP  Never done  . DEXA SCAN  Never done  . PNA vac Low Risk Adult (1 of 2 - PCV13) Never done  . COVID-19 Vaccine (3 - Booster for Pfizer series) 10/15/2019  . URINE MICROALBUMIN  03/12/2020 (Originally 02/08/1932)  . FOOT EXAM  Discontinued  . OPHTHALMOLOGY EXAM  Discontinued    Health Maintenance  Health Maintenance Due  Topic Date Due  . HEMOGLOBIN A1C  Never done  . TETANUS/TDAP  Never done  . DEXA SCAN  Never done  . PNA vac Low Risk Adult (1 of 2 - PCV13) Never done  . COVID-19 Vaccine (3 - Booster for Pfizer series) 10/15/2019    Colorectal cancer screening: No longer required.   Mammogram status: No longer required due to over age 61.  Bone Dexa: Compelte  Lung Cancer Screening: (Low Dose CT Chest recommended if Age 24-80 years, 30 pack-year currently smoking OR have quit w/in 15years.) does not  qualify.   Lung Cancer Screening Referral: n/a  Additional Screening:  Hepatitis C Screening: does not qualify; Completed  Vision Screening: Recommended annual ophthalmology exams for early detection of glaucoma and other disorders of the eye. Is the patient up to date with their annual eye exam?  No  Who is the provider or what is the name of the office in which the patient attends annual eye exams? n/a If pt is not established with a provider, would they like to be referred to a provider to establish care? No .   Dental Screening: Recommended annual dental exams for proper oral hygiene  Community Resource Referral / Chronic Care Management: CRR required this visit?  No   CCM required this visit?  No      Plan:     I have personally reviewed and noted the following in the patient's chart:   . Medical and social history . Use of alcohol, tobacco or illicit drugs  . Current medications and supplements . Functional ability and status . Nutritional status . Physical activity . Advanced directives . List of other physicians . Hospitalizations, surgeries, and ER visits in previous 12 months . Vitals . Screenings to include cognitive, depression, and falls . Referrals and appointments  In addition, I have reviewed and discussed with patient certain preventive protocols, quality metrics, and best practice recommendations. A written personalized care plan for preventive services as well as general preventive health recommendations were provided to patient.     Laretta Bolster, [place]   [id number]   Nurse Notes: AWV conducted by nurse in office by phone. Patient gave consent to telehealth visit via audio. Patient at home at the time of this visit. Provider in the office at the time of this visit. Visit took 30 minutes to complete.

## 2020-02-19 DIAGNOSIS — E785 Hyperlipidemia, unspecified: Secondary | ICD-10-CM | POA: Diagnosis not present

## 2020-02-19 DIAGNOSIS — I10 Essential (primary) hypertension: Secondary | ICD-10-CM | POA: Diagnosis not present

## 2020-02-28 ENCOUNTER — Inpatient Hospital Stay (HOSPITAL_COMMUNITY): Payer: PPO | Attending: Oncology

## 2020-02-28 ENCOUNTER — Other Ambulatory Visit: Payer: Self-pay

## 2020-02-28 ENCOUNTER — Encounter (HOSPITAL_COMMUNITY): Payer: Self-pay

## 2020-02-28 VITALS — BP 176/81 | HR 68 | Temp 97.1°F | Resp 18

## 2020-02-28 DIAGNOSIS — D510 Vitamin B12 deficiency anemia due to intrinsic factor deficiency: Secondary | ICD-10-CM

## 2020-02-28 DIAGNOSIS — E538 Deficiency of other specified B group vitamins: Secondary | ICD-10-CM | POA: Insufficient documentation

## 2020-02-28 DIAGNOSIS — D509 Iron deficiency anemia, unspecified: Secondary | ICD-10-CM | POA: Insufficient documentation

## 2020-02-28 DIAGNOSIS — C7A092 Malignant carcinoid tumor of the stomach: Secondary | ICD-10-CM | POA: Diagnosis not present

## 2020-02-28 MED ORDER — CYANOCOBALAMIN 1000 MCG/ML IJ SOLN
1000.0000 ug | Freq: Once | INTRAMUSCULAR | Status: AC
  Administered 2020-02-28: 1000 ug via INTRAMUSCULAR
  Filled 2020-02-28: qty 1

## 2020-02-28 NOTE — Progress Notes (Signed)
Patient tolerated injection with no complaints voiced.  Site clean and dry with no bruising or swelling noted at site.  Band aid applied.  Vss with discharge and left in satisfactory condition with no s/s of distress noted.  

## 2020-03-27 ENCOUNTER — Encounter (HOSPITAL_COMMUNITY): Payer: Self-pay

## 2020-03-27 ENCOUNTER — Inpatient Hospital Stay (HOSPITAL_COMMUNITY): Payer: PPO | Attending: Oncology

## 2020-03-27 ENCOUNTER — Other Ambulatory Visit: Payer: Self-pay

## 2020-03-27 VITALS — BP 177/72 | HR 91 | Temp 96.6°F | Resp 16

## 2020-03-27 DIAGNOSIS — D509 Iron deficiency anemia, unspecified: Secondary | ICD-10-CM | POA: Insufficient documentation

## 2020-03-27 DIAGNOSIS — E538 Deficiency of other specified B group vitamins: Secondary | ICD-10-CM | POA: Diagnosis not present

## 2020-03-27 DIAGNOSIS — D510 Vitamin B12 deficiency anemia due to intrinsic factor deficiency: Secondary | ICD-10-CM

## 2020-03-27 DIAGNOSIS — C7A092 Malignant carcinoid tumor of the stomach: Secondary | ICD-10-CM | POA: Diagnosis not present

## 2020-03-27 MED ORDER — CYANOCOBALAMIN 1000 MCG/ML IJ SOLN
1000.0000 ug | Freq: Once | INTRAMUSCULAR | Status: AC
  Administered 2020-03-27: 1000 ug via INTRAMUSCULAR
  Filled 2020-03-27: qty 1

## 2020-03-27 NOTE — Progress Notes (Signed)
Patient tolerated Vitamin B12 injection with no complaints voiced.  Site clean and dry with no bruising or swelling noted at site.  Patient stable during and after injection.  Band aid applied.  Vss with discharge and left in satisfactory condition with no s/s of distress noted.

## 2020-04-06 ENCOUNTER — Ambulatory Visit (INDEPENDENT_AMBULATORY_CARE_PROVIDER_SITE_OTHER): Payer: PPO | Admitting: Internal Medicine

## 2020-04-06 ENCOUNTER — Other Ambulatory Visit: Payer: Self-pay | Admitting: Internal Medicine

## 2020-04-06 ENCOUNTER — Encounter: Payer: Self-pay | Admitting: Internal Medicine

## 2020-04-06 ENCOUNTER — Telehealth: Payer: Self-pay | Admitting: *Deleted

## 2020-04-06 ENCOUNTER — Other Ambulatory Visit: Payer: Self-pay

## 2020-04-06 VITALS — BP 167/82 | HR 86 | Resp 18 | Ht 62.0 in | Wt 97.4 lb

## 2020-04-06 DIAGNOSIS — G301 Alzheimer's disease with late onset: Secondary | ICD-10-CM | POA: Diagnosis not present

## 2020-04-06 DIAGNOSIS — F0280 Dementia in other diseases classified elsewhere without behavioral disturbance: Secondary | ICD-10-CM | POA: Diagnosis not present

## 2020-04-06 DIAGNOSIS — I10 Essential (primary) hypertension: Secondary | ICD-10-CM | POA: Diagnosis not present

## 2020-04-06 DIAGNOSIS — F0390 Unspecified dementia without behavioral disturbance: Secondary | ICD-10-CM | POA: Insufficient documentation

## 2020-04-06 MED ORDER — DONEPEZIL HCL 5 MG PO TBDP: 5.0000 mg | ORAL_TABLET | Freq: Every day | ORAL | 3 refills | Status: DC

## 2020-04-06 MED ORDER — FELODIPINE ER 10 MG PO TB24: 10.0000 mg | ORAL_TABLET | Freq: Every day | ORAL | 0 refills | Status: DC

## 2020-04-06 MED ORDER — DONEPEZIL HCL 5 MG PO TABS: 5.0000 mg | ORAL_TABLET | Freq: Every day | ORAL | 3 refills | Status: DC

## 2020-04-06 NOTE — Telephone Encounter (Signed)
I had sent ODT as regular one was showing as not covered by her insurance. I have sent the regular one now. If it was covered by her insurance, they should fill the same.

## 2020-04-06 NOTE — Assessment & Plan Note (Signed)
On Donepezil 5 mg QD, refills provided

## 2020-04-06 NOTE — Progress Notes (Signed)
Established Patient Office Visit  Subjective:  Patient ID: [name], female    DOB: 05-16-1922  Age: 87 y.o. MRN: [id number]  CC:  Chief Complaint  Patient presents with  . Follow-up    8 week follow up     HPI [name] is a 87 year old female with PMH of HTN, benign neuroendocrine carcinoid tumor of stomach, iron deficiency anemia, vitamin B12 deficiency and malnutrition who presents for follow up of her HTN. Her son is present with her during the visit.  Her BP was elevated in the office today. She has had high BP readings around 170s/90s at home as well. She denies any headache, chest pain, dyspnea or palpitations. She does not have dizziness now. She has been getting iron infusions. She has been taking Levothyroxine as well.  She has been taking Donepezil for mild cognitive decline and requests a refill. Son denies any behavioral disturbance.  Past Medical History:  Diagnosis Date  . Adult BMI 19-24 kg/sq m 2008 125 lbs  . Anemia    Pernicious 2008-HB 12 MCV 119.5; JAN 2012 HB 13.2 MCV 97.6  . Carcinoid tumor of stomach 2008  . Diverticulosis   . GERD (gastroesophageal reflux disease)   . Hiatal hernia   . HTN (hypertension)   . Hyperlipidemia   . Hypertension    Phreesia 02/07/2020  . 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 Pernicious anemia   . Pernicious anemia 06/14/2010  . Wrist fracture, left 2008    Past Surgical History:  Procedure Laterality Date  . COLONOSCOPY  2003 LS   DC/[place] Diverticulosis  . EUS  2008 DJ   FNA NO CARCINOID  . EUS  JAN 2009   NL EXAM, NO Bx  . EUS  MAR 2011 WO   CARCINOID  . UPPER GASTROINTESTINAL ENDOSCOPY  APR 2008 SLF   CARCIONOID  . UPPER GASTROINTESTINAL ENDOSCOPY  FEB 2011 [facility]   CARCINOID/mild anemia/large hiatal hernia    Family History  Problem Relation Age of Onset  . Cancer Mother   . Heart disease Brother   . Colon cancer Neg Hx   . Colon polyps Neg Hx     Social History   Socioeconomic History  . Marital  status: Widowed    Spouse name: Not on file  . Number of children: 1  . Years of education: Not on file  . Highest education level: Not on file  Occupational History  . Not on file  Tobacco Use  . Smoking status: Never Smoker  . Smokeless tobacco: Never Used  Vaping Use  . Vaping Use: Never used  Substance and Sexual Activity  . Alcohol use: No  . Drug use: No  . Sexual activity: Not Currently    Birth control/protection: Post-menopausal  Other Topics Concern  . Not on file  Social History Narrative  . Not on file   Social Determinants of Health   Financial Resource Strain: Low Risk   . Difficulty of Paying Living Expenses: Not hard at all  Food Insecurity: No Food Insecurity  . Worried About Charity fundraiser in the Last Year: Never true  . Ran Out of Food in the Last Year: Never true  Transportation Needs: No Transportation Needs  . Lack of Transportation (Medical): No  . Lack of Transportation (Non-Medical): No  Physical Activity: Inactive  . Days of Exercise per Week: 0 days  . Minutes of Exercise per Session: 0 min  Stress: No Stress Concern Present  .  Feeling of Stress : Not at all  Social Connections: Socially Isolated  . Frequency of Communication with Friends and Family: More than three times a week  . Frequency of Social Gatherings with Friends and Family: More than three times a week  . Attends Religious Services: Never  . Active Member of Clubs or Organizations: No  . Attends Archivist Meetings: Never  . Marital Status: Widowed  Intimate Partner Violence: Not At Risk  . Fear of Current or Ex-Partner: No  . Emotionally Abused: No  . Physically Abused: No  . Sexually Abused: No    Outpatient Medications Prior to Visit  Medication Sig Dispense Refill  . acetaminophen (TYLENOL) 500 MG tablet Take 500 mg by mouth every 6 (six) hours as needed. For pain    . famotidine (PEPCID) 20 MG tablet Take 20 mg by mouth daily.    Marland Kitchen levothyroxine  (SYNTHROID) 25 MCG tablet Take 1 tablet (25 mcg total) by mouth daily before breakfast. 30 tablet 3  . simvastatin (ZOCOR) 40 MG tablet Take 40 mg by mouth daily.    . felodipine (PLENDIL) 5 MG 24 hr tablet Take 1 tablet (5 mg total) by mouth daily. 90 tablet 1   No facility-administered medications prior to visit.    Allergies  Allergen Reactions  . Codeine     ROS Review of Systems  Constitutional: Negative for chills and fever.  HENT: Negative for congestion, sinus pressure, sinus pain and sore throat.   Eyes: Negative for pain and discharge.  Respiratory: Negative for cough and shortness of breath.   Cardiovascular: Negative for chest pain and palpitations.  Gastrointestinal: Negative for abdominal pain, constipation, diarrhea, nausea and vomiting.  Endocrine: Negative for polydipsia and polyuria.  Genitourinary: Negative for dysuria and hematuria.  Musculoskeletal: Negative for neck pain and neck stiffness.  Skin: Negative for rash.  Neurological: Negative for dizziness, weakness and headaches.  Psychiatric/Behavioral: Negative for agitation and behavioral problems.      Objective:    Physical Exam Vitals reviewed.  Constitutional:      General: She is not in acute distress.    Appearance: She is not diaphoretic.  HENT:     Head: Normocephalic and atraumatic.     Nose: Nose normal. No congestion.     Mouth/Throat:     Mouth: Mucous membranes are moist.     Pharynx: No posterior oropharyngeal erythema.  Eyes:     General: No scleral icterus.    Extraocular Movements: Extraocular movements intact.     Pupils: Pupils are equal, round, and reactive to light.  Cardiovascular:     Rate and Rhythm: Normal rate and regular rhythm.     Pulses: Normal pulses.     Heart sounds: Normal heart sounds. No murmur heard.   Pulmonary:     Breath sounds: Normal breath sounds. No wheezing or rales.  Musculoskeletal:     Cervical back: Neck supple. No tenderness.     Right  lower leg: No edema.     Left lower leg: No edema.  Skin:    General: Skin is warm.     Findings: No rash.  Neurological:     General: No focal deficit present.     Mental Status: She is alert and oriented to person, place, and time.     Sensory: No sensory deficit.     Motor: No weakness.  Psychiatric:        Mood and Affect: Mood normal.  Behavior: Behavior normal.     BP (!) 167/82 (BP Location: Left Arm, Patient Position: Sitting, Cuff Size: Normal)   Pulse 86   Resp 18   Ht 5\' 2"  (1.575 m)   Wt 97 lb 6.4 oz (44.2 kg)   SpO2 98%   BMI 17.81 kg/m  Wt Readings from Last 3 Encounters:  04/06/20 97 lb 6.4 oz (44.2 kg)  02/18/20 95 lb (43.1 kg)  02/10/20 95 lb (43.1 kg)     Health Maintenance Due  Topic Date Due  . HEMOGLOBIN A1C  Never done  . URINE MICROALBUMIN  Never done  . TETANUS/TDAP  Never done  . DEXA SCAN  Never done  . PNA vac Low Risk Adult (1 of 2 - PCV13) Never done  . COVID-19 Vaccine (3 - Booster for Pfizer series) 10/15/2019    There are no preventive care reminders to display for this patient.  Lab Results  Component Value Date   TSH 7.204 (H) 01/04/2020   Lab Results  Component Value Date   WBC 4.6 01/17/2020   HGB 8.1 (L) 01/17/2020   HCT 27.5 (L) 01/17/2020   MCV 86.8 01/17/2020   PLT 265 01/17/2020   Lab Results  Component Value Date   NA 140 01/17/2020   K 3.8 01/17/2020   CO2 24 01/17/2020   GLUCOSE 90 01/17/2020   BUN 21 01/17/2020   CREATININE 1.11 (H) 01/17/2020   BILITOT 0.4 01/17/2020   ALKPHOS 74 01/17/2020   AST 20 01/17/2020   ALT 10 01/17/2020   PROT 7.1 01/17/2020   ALBUMIN 3.8 01/17/2020   CALCIUM 8.5 (L) 01/17/2020   ANIONGAP 9 01/17/2020   No results found for: CHOL No results found for: HDL No results found for: LDLCALC No results found for: TRIG No results found for: CHOLHDL No results found for: HGBA1C    Assessment & Plan:   Problem List Items Addressed This Visit      Cardiovascular and  Mediastinum   Essential hypertension - Primary    BP Readings from Last 1 Encounters:  04/06/20 (!) 167/82   Elevated despite restarting Felodipine 5 mg QD Increased dose to 10 mg QD Counseled for compliance with the medications      Relevant Medications   felodipine (PLENDIL) 10 MG 24 hr tablet     Nervous and Auditory   Late onset Alzheimer's dementia without behavioral disturbance (HCC)    On Donepezil 5 mg QD, refills provided      Relevant Medications   donepezil (ARICEPT ODT) 5 MG disintegrating tablet      Meds ordered this encounter  Medications  . felodipine (PLENDIL) 10 MG 24 hr tablet    Sig: Take 1 tablet (10 mg total) by mouth daily.    Dispense:  90 tablet    Refill:  0  . donepezil (ARICEPT ODT) 5 MG disintegrating tablet    Sig: Take 1 tablet (5 mg total) by mouth at bedtime.    Dispense:  90 tablet    Refill:  3    Follow-up: Return in about 3 months (around 07/07/2020) for HTN and hypothyroidism.    [name], MD

## 2020-04-06 NOTE — Patient Instructions (Signed)
Please start taking Felodipine 10 mg once daily instead of 5 mg.  Please get fasting blood tests done within a week.  Continue to stay hydrated by taking at least 50 ounces of fluid in a day and ambulate as tolerated.

## 2020-04-06 NOTE — Telephone Encounter (Signed)
[facility] called about the aricept. Said the oral disintegrating tablets was called in and the pt had gotten regular aricept in the past wanted to know if this was an error or if he should feel oral disintegrating? Please advise

## 2020-04-06 NOTE — Assessment & Plan Note (Signed)
BP Readings from Last 1 Encounters:  04/06/20 (!) 167/82   Elevated despite restarting Felodipine 5 mg QD Increased dose to 10 mg QD Counseled for compliance with the medications

## 2020-04-07 NOTE — Telephone Encounter (Signed)
[facility] pharmacy notified

## 2020-04-19 ENCOUNTER — Other Ambulatory Visit (HOSPITAL_COMMUNITY): Payer: Self-pay

## 2020-04-19 DIAGNOSIS — D509 Iron deficiency anemia, unspecified: Secondary | ICD-10-CM

## 2020-04-20 ENCOUNTER — Other Ambulatory Visit: Payer: Self-pay

## 2020-04-20 ENCOUNTER — Inpatient Hospital Stay (HOSPITAL_COMMUNITY): Payer: PPO

## 2020-04-20 DIAGNOSIS — D509 Iron deficiency anemia, unspecified: Secondary | ICD-10-CM

## 2020-04-20 DIAGNOSIS — C7A092 Malignant carcinoid tumor of the stomach: Secondary | ICD-10-CM | POA: Diagnosis not present

## 2020-04-20 LAB — IRON AND TIBC
Iron: 92 ug/dL (ref 28–170)
Saturation Ratios: 33 % — ABNORMAL HIGH (ref 10.4–31.8)
TIBC: 275 ug/dL (ref 250–450)
UIBC: 183 ug/dL

## 2020-04-20 LAB — COMPREHENSIVE METABOLIC PANEL
ALT: 10 U/L (ref 0–44)
AST: 22 U/L (ref 15–41)
Albumin: 3.9 g/dL (ref 3.5–5.0)
Alkaline Phosphatase: 89 U/L (ref 38–126)
Anion gap: 8 (ref 5–15)
BUN: 19 mg/dL (ref 8–23)
CO2: 25 mmol/L (ref 22–32)
Calcium: 9.3 mg/dL (ref 8.9–10.3)
Chloride: 109 mmol/L (ref 98–111)
Creatinine, Ser: 1.2 mg/dL — ABNORMAL HIGH (ref 0.44–1.00)
GFR, Estimated: 41 mL/min — ABNORMAL LOW (ref >60)
Glucose, Bld: 93 mg/dL (ref 70–99)
Potassium: 3.7 mmol/L (ref 3.5–5.1)
Sodium: 142 mmol/L (ref 135–145)
Total Bilirubin: 0.7 mg/dL (ref 0.3–1.2)
Total Protein: 7.5 g/dL (ref 6.5–8.1)

## 2020-04-20 LAB — CBC WITH DIFFERENTIAL/PLATELET
Abs Immature Granulocytes: 0.01 10*3/uL (ref 0.00–0.07)
Basophils Absolute: 0 10*3/uL (ref 0.0–0.1)
Basophils Relative: 0 %
Eosinophils Absolute: 0 10*3/uL (ref 0.0–0.5)
Eosinophils Relative: 1 %
HCT: 41.2 % (ref 36.0–46.0)
Hemoglobin: 12.6 g/dL (ref 12.0–15.0)
Immature Granulocytes: 0 %
Lymphocytes Relative: 16 %
Lymphs Abs: 0.6 10*3/uL — ABNORMAL LOW (ref 0.7–4.0)
MCH: 31 pg (ref 26.0–34.0)
MCHC: 30.6 g/dL (ref 30.0–36.0)
MCV: 101.5 fL — ABNORMAL HIGH (ref 80.0–100.0)
Monocytes Absolute: 0.3 10*3/uL (ref 0.1–1.0)
Monocytes Relative: 9 %
Neutro Abs: 2.7 10*3/uL (ref 1.7–7.7)
Neutrophils Relative %: 74 %
Platelets: 105 10*3/uL — ABNORMAL LOW (ref 150–400)
RBC: 4.06 MIL/uL (ref 3.87–5.11)
RDW: 13.8 % (ref 11.5–15.5)
WBC: 3.6 10*3/uL — ABNORMAL LOW (ref 4.0–10.5)
nRBC: 0 % (ref 0.0–0.2)

## 2020-04-20 LAB — FERRITIN: Ferritin: 145 ng/mL (ref 11–307)

## 2020-04-27 ENCOUNTER — Inpatient Hospital Stay (HOSPITAL_COMMUNITY): Payer: PPO | Attending: Oncology | Admitting: Hematology

## 2020-04-27 ENCOUNTER — Inpatient Hospital Stay (HOSPITAL_COMMUNITY): Payer: PPO

## 2020-04-27 ENCOUNTER — Other Ambulatory Visit: Payer: Self-pay

## 2020-04-27 VITALS — BP 130/57 | HR 87 | Temp 97.8°F | Resp 18 | Ht 62.0 in | Wt 101.0 lb

## 2020-04-27 DIAGNOSIS — E538 Deficiency of other specified B group vitamins: Secondary | ICD-10-CM | POA: Insufficient documentation

## 2020-04-27 DIAGNOSIS — D509 Iron deficiency anemia, unspecified: Secondary | ICD-10-CM | POA: Diagnosis not present

## 2020-04-27 DIAGNOSIS — D510 Vitamin B12 deficiency anemia due to intrinsic factor deficiency: Secondary | ICD-10-CM

## 2020-04-27 DIAGNOSIS — C7A092 Malignant carcinoid tumor of the stomach: Secondary | ICD-10-CM | POA: Insufficient documentation

## 2020-04-27 DIAGNOSIS — D3A092 Benign carcinoid tumor of the stomach: Secondary | ICD-10-CM

## 2020-04-27 MED ORDER — CYANOCOBALAMIN 1000 MCG/ML IJ SOLN
1000.0000 ug | Freq: Once | INTRAMUSCULAR | Status: AC
  Administered 2020-04-27: 1000 ug via INTRAMUSCULAR
  Filled 2020-04-27: qty 1

## 2020-04-27 NOTE — Progress Notes (Signed)
Patient was assessed by Dr. Delton Coombes and labs have been reviewed.  Patient is okay to proceed with Vitamin B12 injection today. Primary RN and pharmacy aware.

## 2020-04-27 NOTE — Progress Notes (Signed)
[name], [name]   CLINIC:  Medical Oncology/Hematology  PCP:  [name], MD [address] / [place] [address] [phone]   REASON FOR VISIT:  Follow-up for neuroendocrine carcinoid tumor of stomach and IDA  PRIOR THERAPY: Intermittent Feraheme last on 02/04/2020  NGS Results: Not done  CURRENT THERAPY: Surveillance  BRIEF ONCOLOGIC HISTORY:  Oncology History   No history exists.    CANCER STAGING: Cancer Staging No matching staging information was found for the patient.  INTERVAL HISTORY:  [name], a 87 y.o. female, returns for routine follow-up of her neuroendocrine carcinoid tumor of stomach and IDA. [name] was last seen by Faythe Casa, NP, on 01/25/2020.   Today she is accompanied by her son and she reports feeling well. She notes that her energy levels improved after getting Feraheme in January and she denies having hematochezia or hematuria. She denies having wheezing, abdominal pain or cramping, diarrhea, or skin flushing. Her appetite is excellent. Her felodipine was recently increased since her systolic BP was in the 417'E.   REVIEW OF SYSTEMS:  Review of Systems  Constitutional: Positive for fatigue (75%). Negative for appetite change.  Respiratory: Negative for wheezing.   Gastrointestinal: Negative for abdominal pain, blood in stool and diarrhea.  Genitourinary: Negative for hematuria.   Skin: Negative for rash.  All other systems reviewed and are negative.   PAST MEDICAL/SURGICAL HISTORY:  Past Medical History:  Diagnosis Date  . Adult BMI 19-24 kg/sq m 2008 125 lbs  . Anemia    Pernicious 2008-HB 12 MCV 119.5; JAN 2012 HB 13.2 MCV 97.6  . Carcinoid tumor of stomach 2008  . Diverticulosis   . GERD (gastroesophageal reflux disease)   . Hiatal hernia   . HTN (hypertension)   . Hyperlipidemia   . Hypertension    Phreesia 02/07/2020  . 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  Pernicious anemia   . Pernicious anemia 06/14/2010  . Wrist fracture, left 2008   Past Surgical History:  Procedure Laterality Date  . COLONOSCOPY  2003 LS   DC/[place] Diverticulosis  . EUS  2008 DJ   FNA NO CARCINOID  . EUS  JAN 2009   NL EXAM, NO Bx  . EUS  MAR 2011 WO   CARCINOID  . UPPER GASTROINTESTINAL ENDOSCOPY  APR 2008 SLF   CARCIONOID  . UPPER GASTROINTESTINAL ENDOSCOPY  FEB 2011 [facility]   CARCINOID/mild anemia/large hiatal hernia    SOCIAL HISTORY:  Social History   Socioeconomic History  . Marital status: Widowed    Spouse name: Not on file  . Number of children: 1  . Years of education: Not on file  . Highest education level: Not on file  Occupational History  . Not on file  Tobacco Use  . Smoking status: Never Smoker  . Smokeless tobacco: Never Used  Vaping Use  . Vaping Use: Never used  Substance and Sexual Activity  . Alcohol use: No  . Drug use: No  . Sexual activity: Not Currently    Birth control/protection: Post-menopausal  Other Topics Concern  . Not on file  Social History Narrative  . Not on file   Social Determinants of Health   Financial Resource Strain: Low Risk   . Difficulty of Paying Living Expenses: Not hard at all  Food Insecurity: No Food Insecurity  . Worried About Charity fundraiser in the Last Year: Never true  . Ran Out  of Food in the Last Year: Never true  Transportation Needs: No Transportation Needs  . Lack of Transportation (Medical): No  . Lack of Transportation (Non-Medical): No  Physical Activity: Inactive  . Days of Exercise per Week: 0 days  . Minutes of Exercise per Session: 0 min  Stress: No Stress Concern Present  . Feeling of Stress : Not at all  Social Connections: Socially Isolated  . Frequency of Communication with Friends and Family: More than three times a week  . Frequency of Social Gatherings with Friends and Family: More than three times a week  . Attends Religious Services: Never  . Active Member of  Clubs or Organizations: No  . Attends Archivist Meetings: Never  . Marital Status: Widowed  Intimate Partner Violence: Not At Risk  . Fear of Current or Ex-Partner: No  . Emotionally Abused: No  . Physically Abused: No  . Sexually Abused: No    FAMILY HISTORY:  Family History  Problem Relation Age of Onset  . Cancer Mother   . Heart disease Brother   . Colon cancer Neg Hx   . Colon polyps Neg Hx     CURRENT MEDICATIONS:  Current Outpatient Medications  Medication Sig Dispense Refill  . acetaminophen (TYLENOL) 500 MG tablet Take 500 mg by mouth every 6 (six) hours as needed. For pain    . donepezil (ARICEPT) 5 MG tablet Take 1 tablet (5 mg total) by mouth at bedtime. 90 tablet 3  . famotidine (PEPCID) 20 MG tablet Take 20 mg by mouth daily.    . felodipine (PLENDIL) 10 MG 24 hr tablet Take 1 tablet (10 mg total) by mouth daily. 90 tablet 0  . levothyroxine (SYNTHROID) 25 MCG tablet Take 1 tablet (25 mcg total) by mouth daily before breakfast. 30 tablet 3  . simvastatin (ZOCOR) 40 MG tablet Take 40 mg by mouth daily.     No current facility-administered medications for this visit.   Facility-Administered Medications Ordered in Other Visits  Medication Dose Route Frequency Provider Last Rate Last Admin  . cyanocobalamin ((VITAMIN B-12)) injection 1,000 mcg  1,000 mcg Intramuscular Once Jacquelin Hawking, NP        ALLERGIES:  Allergies  Allergen Reactions  . Codeine     PHYSICAL EXAM:  Performance status (ECOG): 1 - Symptomatic but completely ambulatory  Vitals:   04/27/20 1055  BP: (!) 130/57  Pulse: 87  Resp: 18  Temp: 97.8 F (36.6 C)  SpO2: 100%   Wt Readings from Last 3 Encounters:  04/27/20 101 lb (45.8 kg)  04/06/20 97 lb 6.4 oz (44.2 kg)  02/18/20 95 lb (43.1 kg)   Physical Exam Vitals reviewed.  Constitutional:      Appearance: Normal appearance.  Cardiovascular:     Rate and Rhythm: Normal rate and regular rhythm.     Pulses: Normal  pulses.     Heart sounds: Normal heart sounds.  Pulmonary:     Effort: Pulmonary effort is normal.     Breath sounds: Normal breath sounds.  Neurological:     General: No focal deficit present.     Mental Status: She is alert and oriented to person, place, and time.  Psychiatric:        Mood and Affect: Mood normal.        Behavior: Behavior normal.      LABORATORY DATA:  I have reviewed the labs as listed.  CBC Latest Ref Rng & Units 04/20/2020 01/17/2020 01/04/2020  WBC 4.0 - 10.5 K/uL 3.6(L) 4.6 9.7  Hemoglobin 12.0 - 15.0 g/dL 12.6 8.1(L) 10.5(L)  Hematocrit 36.0 - 46.0 % 41.2 27.5(L) 36.7  Platelets 150 - 400 K/uL 105(L) 265 180   CMP Latest Ref Rng & Units 04/20/2020 01/17/2020 01/04/2020  Glucose 70 - 99 mg/dL 93 90 195(H)  BUN 8 - 23 mg/dL 19 21 22   Creatinine 0.44 - 1.00 mg/dL 1.20(H) 1.11(H) 1.24(H)  Sodium 135 - 145 mmol/L 142 140 135  Potassium 3.5 - 5.1 mmol/L 3.7 3.8 3.6  Chloride 98 - 111 mmol/L 109 107 100  CO2 22 - 32 mmol/L 25 24 23   Calcium 8.9 - 10.3 mg/dL 9.3 8.5(L) 9.3  Total Protein 6.5 - 8.1 g/dL 7.5 7.1 -  Total Bilirubin 0.3 - 1.2 mg/dL 0.7 0.4 -  Alkaline Phos 38 - 126 U/L 89 74 -  AST 15 - 41 U/L 22 20 -  ALT 0 - 44 U/L 10 10 -   Lab Results  Component Value Date   TIBC 275 04/20/2020   TIBC 415 01/17/2020   TIBC 303 09/29/2017   FERRITIN 145 04/20/2020   FERRITIN 8 (L) 01/17/2020   FERRITIN 34 04/06/2018   IRONPCTSAT 33 (H) 04/20/2020   IRONPCTSAT 3 (L) 01/17/2020   IRONPCTSAT 16 09/29/2017    DIAGNOSTIC IMAGING:  I have independently reviewed the scans and discussed with the patient. No results found.   ASSESSMENT:  1.  Well-differentiated carcinoid of the stomach: - Biopsy of the stomach in 2011 consistent with carcinoid tumor. -She is on active surveillance since then.  2.  B12 deficiency: -She is on monthly B12 injections.  3.  Iron deficiency state: -She is receiving intermittent iron infusions.   PLAN:  1.   Well-differentiated carcinoid of the stomach: -SHe does not have any symptoms of carcinoid syndrome. -I will chromogranin level was 813 on 01/17/2020. -We reviewed CT CAP from 02/08/2020 which did not show any evidence of metastatic carcinoid.  Several small stable pulmonary nodules are present.  No obvious gastric mass or other indicator of active neuroendocrine tumor.  We have discussed about incidental finding of for complex left renal lesions, particularly the left upper pole lesion which could represent a cystic renal cell carcinoma. -However given her advanced age, we chose to follow-up at this time. -RTC 4 months with repeat labs including chromogranin level.  2.  B12 deficiency: -Continue B12 injections monthly.  3.  Iron deficiency state: -She received 2 infusions of Feraheme in January 2022. -Hemoglobin improved to 12.6 from 8.1 in December.  Ferritin is 145 up from 8. -No parenteral iron therapy needed.  We will repeat iron panel in 4 months.    Orders placed this encounter:  Orders Placed This Encounter  Procedures  . CBC with Differential/Platelet  . Comprehensive metabolic panel  . Ferritin  . Iron and TIBC  . Chromogranin A     Derek Jack, MD [facility] [phone]   I, Milinda Antis, am acting as a scribe for Dr. Sanda Linger.  I, Derek Jack MD, have reviewed the above documentation for accuracy and completeness, and I agree with the above.

## 2020-04-27 NOTE — Patient Instructions (Signed)
[place] at [facility] Discharge Instructions  You were seen today by Dr. Delton Coombes. He went over your recent results and scans. You received your vitamin B12 injection today; continue getting your injection every month. Drink up to 1 liter of water daily to keep your kidneys flushed. Your next appointment will be with the physician assistant in 4 months for labs and follow up.   Thank you for choosing [place] at [facility] to provide your oncology and hematology care.  To afford each patient quality time with our provider, please arrive at least 15 minutes before your scheduled appointment time.   If you have a lab appointment with the [facility] please come in thru the Main Entrance and check in at the main information desk  You need to re-schedule your appointment should you arrive 10 or more minutes late.  We strive to give you quality time with our providers, and arriving late affects you and other patients whose appointments are after yours.  Also, if you no show three or more times for appointments you may be dismissed from the clinic at the providers discretion.     Again, thank you for choosing [facility].  Our hope is that these requests will decrease the amount of time that you wait before being seen by our physicians.       _____________________________________________________________  Should you have questions after your visit to [facility], please contact our office at [phone] between the hours of 8:00 a.m. and 4:30 p.m.  Voicemails left after 4:00 p.m. will not be returned until the following business day.  For prescription refill requests, have your pharmacy contact our office and allow 72 hours.    Cancer Center Support Programs:   > Cancer Support Group  2nd Tuesday of the month 1pm-2pm, Journey Room

## 2020-04-27 NOTE — Progress Notes (Signed)
Patient tolerated B12 injection with no complaints voiced. Site clean and dry with no bruising or swelling noted at site. See MAR for details. Band aid applied.  Patient stable during and after injection. VSS with discharge and left in satisfactory condition with no s/s of distress noted. 

## 2020-04-27 NOTE — Patient Instructions (Signed)
[facility] at [facility]  Discharge Instructions:  You received your B12 injection today. Return as scheduled. _______________________________________________________________  Thank you for choosing [facility] at [facility] to provide your oncology and hematology care.  To afford each patient quality time with our providers, please arrive at least 15 minutes before your scheduled appointment.  You need to re-schedule your appointment if you arrive 10 or more minutes late.  We strive to give you quality time with our providers, and arriving late affects you and other patients whose appointments are after yours.  Also, if you no show three or more times for appointments you may be dismissed from the clinic.  Again, thank you for choosing [facility] at [facility]. Our hope is that these requests will allow you access to exceptional care and in a timely manner. _______________________________________________________________  If you have questions after your visit, please contact our office at [phone] between the hours of 8:30 a.m. and 5:00 p.m. Voicemails left after 4:30 p.m. will not be returned until the following business day. _______________________________________________________________  For prescription refill requests, have your pharmacy contact our office. _______________________________________________________________  Recommendations made by the consultant and any test results will be sent to your referring physician. _______________________________________________________________ 

## 2020-05-12 DIAGNOSIS — Z23 Encounter for immunization: Secondary | ICD-10-CM | POA: Diagnosis not present

## 2020-05-26 ENCOUNTER — Inpatient Hospital Stay (HOSPITAL_COMMUNITY): Payer: PPO | Attending: Oncology

## 2020-05-26 ENCOUNTER — Other Ambulatory Visit: Payer: Self-pay

## 2020-05-26 VITALS — BP 148/59 | HR 94 | Temp 97.8°F | Resp 18

## 2020-05-26 DIAGNOSIS — E538 Deficiency of other specified B group vitamins: Secondary | ICD-10-CM | POA: Insufficient documentation

## 2020-05-26 DIAGNOSIS — D509 Iron deficiency anemia, unspecified: Secondary | ICD-10-CM

## 2020-05-26 DIAGNOSIS — D510 Vitamin B12 deficiency anemia due to intrinsic factor deficiency: Secondary | ICD-10-CM

## 2020-05-26 MED ORDER — CYANOCOBALAMIN 1000 MCG/ML IJ SOLN
INTRAMUSCULAR | Status: AC
  Filled 2020-05-26: qty 1

## 2020-05-26 MED ORDER — CYANOCOBALAMIN 1000 MCG/ML IJ SOLN
1000.0000 ug | Freq: Once | INTRAMUSCULAR | Status: AC
  Administered 2020-05-26: 1000 ug via INTRAMUSCULAR

## 2020-05-26 NOTE — Patient Instructions (Signed)
[facility]  Discharge Instructions: Thank you for choosing [facility] to provide your oncology and hematology care.  If you have a lab appointment with the Cancer Center, please come in thru the Main Entrance and check in at the main information desk.  Wear comfortable clothing and clothing appropriate for easy access to any Portacath or PICC line.   We strive to give you quality time with your provider. You may need to reschedule your appointment if you arrive late (15 or more minutes).  Arriving late affects you and other patients whose appointments are after yours.  Also, if you miss three or more appointments without notifying the office, you may be dismissed from the clinic at the provider's discretion.      For prescription refill requests, have your pharmacy contact our office and allow 72 hours for refills to be completed.    Today you received B12 injection    To help prevent nausea and vomiting after your treatment, we encourage you to take your nausea medication as directed.  BELOW ARE SYMPTOMS THAT SHOULD BE REPORTED IMMEDIATELY: . *FEVER GREATER THAN 100.4 F (38 C) OR HIGHER . *CHILLS OR SWEATING . *NAUSEA AND VOMITING THAT IS NOT CONTROLLED WITH YOUR NAUSEA MEDICATION . *UNUSUAL SHORTNESS OF BREATH . *UNUSUAL BRUISING OR BLEEDING . *URINARY PROBLEMS (pain or burning when urinating, or frequent urination) . *BOWEL PROBLEMS (unusual diarrhea, constipation, pain near the anus) . TENDERNESS IN MOUTH AND THROAT WITH OR WITHOUT PRESENCE OF ULCERS (sore throat, sores in mouth, or a toothache) . UNUSUAL RASH, SWELLING OR PAIN  . UNUSUAL VAGINAL DISCHARGE OR ITCHING   Items with * indicate a potential emergency and should be followed up as soon as possible or go to the Emergency Department if any problems should occur.  Please show the CHEMOTHERAPY ALERT CARD or IMMUNOTHERAPY ALERT CARD at check-in to the Emergency Department and triage nurse.  Should  you have questions after your visit or need to cancel or reschedule your appointment, please contact [facility] [phone]  and follow the prompts.  Office hours are 8:00 a.m. to 4:30 p.m. Monday - Friday. Please note that voicemails left after 4:00 p.m. may not be returned until the following business day.  We are closed weekends and major holidays. You have access to a nurse at all times for urgent questions. Please call the main number to the clinic [phone] and follow the prompts.  For any non-urgent questions, you may also contact your provider using MyChart. We now offer e-Visits for anyone 18 and older to request care online for non-urgent symptoms. For details visit [email or web address].   Also download the MyChart app! Go to the app store, search "MyChart", open the app, select [place], and log in with your MyChart username and password.  Due to Covid, a mask is required upon entering the hospital/clinic. If you do not have a mask, one will be given to you upon arrival. For doctor visits, patients may have 1 support person aged 18 or older with them. For treatment visits, patients cannot have anyone with them due to current Covid guidelines and our immunocompromised population.  

## 2020-05-26 NOTE — Progress Notes (Signed)
Patient presents today for injection per the provider's orders.  B12 administration without incident; injection site WNL; see MAR for injection details.  VVS.  Patient tolerated procedure well and without incident.  No questions or complaints noted at this time.

## 2020-06-26 ENCOUNTER — Other Ambulatory Visit: Payer: Self-pay

## 2020-06-26 ENCOUNTER — Inpatient Hospital Stay (HOSPITAL_COMMUNITY): Payer: PPO | Attending: Oncology

## 2020-06-26 VITALS — BP 107/46 | HR 76 | Temp 97.2°F | Resp 18

## 2020-06-26 DIAGNOSIS — D509 Iron deficiency anemia, unspecified: Secondary | ICD-10-CM

## 2020-06-26 DIAGNOSIS — E538 Deficiency of other specified B group vitamins: Secondary | ICD-10-CM | POA: Insufficient documentation

## 2020-06-26 DIAGNOSIS — D510 Vitamin B12 deficiency anemia due to intrinsic factor deficiency: Secondary | ICD-10-CM

## 2020-06-26 MED ORDER — CYANOCOBALAMIN 1000 MCG/ML IJ SOLN
1000.0000 ug | Freq: Once | INTRAMUSCULAR | Status: AC
  Administered 2020-06-26: 1000 ug via INTRAMUSCULAR

## 2020-06-26 MED ORDER — CYANOCOBALAMIN 1000 MCG/ML IJ SOLN
INTRAMUSCULAR | Status: AC
  Filled 2020-06-26: qty 1

## 2020-06-26 NOTE — Patient Instructions (Signed)
[name]  Discharge Instructions: Thank you for choosing [facility] to provide your oncology and hematology care.  If you have a lab appointment with the Cancer Center, please come in thru the Main Entrance and check in at the main information desk.  Wear comfortable clothing and clothing appropriate for easy access to any Portacath or PICC line.   We strive to give you quality time with your provider. You may need to reschedule your appointment if you arrive late (15 or more minutes).  Arriving late affects you and other patients whose appointments are after yours.  Also, if you miss three or more appointments without notifying the office, you may be dismissed from the clinic at the provider's discretion.      For prescription refill requests, have your pharmacy contact our office and allow 72 hours for refills to be completed.        To help prevent nausea and vomiting after your treatment, we encourage you to take your nausea medication as directed.  BELOW ARE SYMPTOMS THAT SHOULD BE REPORTED IMMEDIATELY: *FEVER GREATER THAN 100.4 F (38 C) OR HIGHER *CHILLS OR SWEATING *NAUSEA AND VOMITING THAT IS NOT CONTROLLED WITH YOUR NAUSEA MEDICATION *UNUSUAL SHORTNESS OF BREATH *UNUSUAL BRUISING OR BLEEDING *URINARY PROBLEMS (pain or burning when urinating, or frequent urination) *BOWEL PROBLEMS (unusual diarrhea, constipation, pain near the anus) TENDERNESS IN MOUTH AND THROAT WITH OR WITHOUT PRESENCE OF ULCERS (sore throat, sores in mouth, or a toothache) UNUSUAL RASH, SWELLING OR PAIN  UNUSUAL VAGINAL DISCHARGE OR ITCHING   Items with * indicate a potential emergency and should be followed up as soon as possible or go to the Emergency Department if any problems should occur.  Please show the CHEMOTHERAPY ALERT CARD or IMMUNOTHERAPY ALERT CARD at check-in to the Emergency Department and triage nurse.  Should you have questions after your visit or need to cancel  or reschedule your appointment, please contact [name] [phone]  and follow the prompts.  Office hours are 8:00 a.m. to 4:30 p.m. Monday - Friday. Please note that voicemails left after 4:00 p.m. may not be returned until the following business day.  We are closed weekends and major holidays. You have access to a nurse at all times for urgent questions. Please call the main number to the clinic [phone] and follow the prompts.  For any non-urgent questions, you may also contact your provider using MyChart. We now offer e-Visits for anyone 18 and older to request care online for non-urgent symptoms. For details visit [email or web address].   Also download the MyChart app! Go to the app store, search "MyChart", open the app, select [facility], and log in with your MyChart username and password.  Due to Covid, a mask is required upon entering the hospital/clinic. If you do not have a mask, one will be given to you upon arrival. For doctor visits, patients may have 1 support person aged 18 or older with them. For treatment visits, patients cannot have anyone with them due to current Covid guidelines and our immunocompromised population.  

## 2020-06-26 NOTE — Progress Notes (Signed)
[name] presents today for B12 injection per the provider's orders.  Stable during administration without incident; injection site WNL; see MAR for injection details.  Patient tolerated procedure well and without incident.  No questions or complaints noted at this time.  Discharge from clinic ambulatory in stable condition.  Alert and oriented X 3.  Follow up with [facility] as scheduled.

## 2020-07-05 ENCOUNTER — Ambulatory Visit (INDEPENDENT_AMBULATORY_CARE_PROVIDER_SITE_OTHER): Payer: PPO | Admitting: Internal Medicine

## 2020-07-05 ENCOUNTER — Other Ambulatory Visit: Payer: Self-pay

## 2020-07-05 ENCOUNTER — Encounter: Payer: Self-pay | Admitting: Internal Medicine

## 2020-07-05 VITALS — BP 140/70 | HR 78 | Ht 62.0 in | Wt 98.0 lb

## 2020-07-05 DIAGNOSIS — E039 Hypothyroidism, unspecified: Secondary | ICD-10-CM

## 2020-07-05 DIAGNOSIS — G301 Alzheimer's disease with late onset: Secondary | ICD-10-CM | POA: Diagnosis not present

## 2020-07-05 DIAGNOSIS — F0280 Dementia in other diseases classified elsewhere without behavioral disturbance: Secondary | ICD-10-CM

## 2020-07-05 DIAGNOSIS — I10 Essential (primary) hypertension: Secondary | ICD-10-CM | POA: Diagnosis not present

## 2020-07-05 MED ORDER — FELODIPINE ER 5 MG PO TB24: 10.0000 mg | ORAL_TABLET | Freq: Every day | ORAL | 0 refills | Status: DC

## 2020-07-05 MED ORDER — VALSARTAN 40 MG PO TABS: 40.0000 mg | ORAL_TABLET | Freq: Every day | ORAL | 0 refills | Status: DC

## 2020-07-05 NOTE — Progress Notes (Signed)
Established Patient Office Visit  Subjective:  Patient ID: [name], female    DOB: 09/02/1922  Age: 87 y.o. MRN: [id number]  CC:  Chief Complaint  Patient presents with   Hypertension    Follow up   Edema    Both ankles, ongoing for several weeks.    HPI [name] is a 87 year old female with PMH of HTN, benign neuroendocrine carcinoid tumor of stomach, iron deficiency anemia, vitamin B12 deficiency and malnutrition who presents for follow up of her HTN. Her son is present with her during the visit.  HTN: Her BP is well-controlled with increased dose of Felodipine. But she has been having b/l ankle swelling. She denies any headache, dizziness, chest pain, dyspnea or palpitations.  Hypothyroidism: Has been taking Levothyroxine 25 mcg QD. Denies any palpitations, recent appetite or weight change or recent skin changes.  She has been taking Donepezil for mild cognitive decline. Son denies any behavioral disturbance.  Past Medical History:  Diagnosis Date   Adult BMI 19-24 kg/sq m 2008 125 lbs   Anemia    Pernicious 2008-HB 12 MCV 119.5; JAN 2012 HB 13.2 MCV 97.6   Carcinoid tumor of stomach 2008   Diverticulosis    GERD (gastroesophageal reflux disease)    Hiatal hernia    HTN (hypertension)    Hyperlipidemia    Hypertension    Phreesia 02/07/2020   Mild memory loss 02/15/2011   Near syncope 04/18/2014   Pernicious anemia    Pernicious anemia 06/14/2010   Wrist fracture, left 2008    Past Surgical History:  Procedure Laterality Date   COLONOSCOPY  2003 LS   DC/[place] Diverticulosis   EUS  2008 DJ   FNA NO CARCINOID   EUS  JAN 2009   NL EXAM, NO Bx   EUS  [facility] 2011 WO   CARCINOID   UPPER GASTROINTESTINAL ENDOSCOPY  APR 2008 SLF   CARCIONOID   UPPER GASTROINTESTINAL ENDOSCOPY  FEB 2011 SL[facility]   CARCINOID/mild anemia/large hiatal hernia    Family History  Problem Relation Age of Onset   Cancer Mother    Heart disease Brother    Colon cancer Neg Hx    Colon  polyps Neg Hx     Social History   Socioeconomic History   Marital status: Widowed    Spouse name: Not on file   Number of children: 1   Years of education: Not on file   Highest education level: Not on file  Occupational History   Not on file  Tobacco Use   Smoking status: Never   Smokeless tobacco: Never  Vaping Use   Vaping Use: Never used  Substance and Sexual Activity   Alcohol use: No   Drug use: No   Sexual activity: Not Currently    Birth control/protection: Post-menopausal  Other Topics Concern   Not on file  Social History Narrative   Not on file   Social Determinants of Health   Financial Resource Strain: Low Risk    Difficulty of Paying Living Expenses: Not hard at all  Food Insecurity: No Food Insecurity   Worried About Charity fundraiser in the Last Year: Never true   [place] in the Last Year: Never true  Transportation Needs: No Transportation Needs   Lack of Transportation (Medical): No   Lack of Transportation (Non-Medical): No  Physical Activity: Inactive   Days of Exercise per Week: 0 days   Minutes of Exercise per Session: 0  min  Stress: No Stress Concern Present   Feeling of Stress : Not at all  Social Connections: Socially Isolated   Frequency of Communication with Friends and Family: More than three times a week   Frequency of Social Gatherings with Friends and Family: More than three times a week   Attends Religious Services: Never   Marine scientist or Organizations: No   Attends Archivist Meetings: Never   Marital Status: Widowed  Human resources officer Violence: Not At Risk   Fear of Current or Ex-Partner: No   Emotionally Abused: No   Physically Abused: No   Sexually Abused: No    Outpatient Medications Prior to Visit  Medication Sig Dispense Refill   acetaminophen (TYLENOL) 500 MG tablet Take 500 mg by mouth every 6 (six) hours as needed. For pain     donepezil (ARICEPT) 5 MG tablet Take 1 tablet (5 mg total)  by mouth at bedtime. 90 tablet 3   levothyroxine (SYNTHROID) 25 MCG tablet Take 1 tablet (25 mcg total) by mouth daily before breakfast. 30 tablet 3   simvastatin (ZOCOR) 40 MG tablet Take 40 mg by mouth daily.     felodipine (PLENDIL) 10 MG 24 hr tablet Take 1 tablet (10 mg total) by mouth daily. 90 tablet 0   famotidine (PEPCID) 20 MG tablet Take 20 mg by mouth daily. (Patient not taking: Reported on 07/05/2020)     No facility-administered medications prior to visit.    Allergies  Allergen Reactions   Codeine     ROS Review of Systems  Constitutional:  Negative for chills and fever.  HENT:  Negative for congestion, sinus pressure, sinus pain and sore throat.   Eyes:  Negative for pain and discharge.  Respiratory:  Negative for cough and shortness of breath.   Cardiovascular:  Negative for chest pain and palpitations.  Gastrointestinal:  Negative for abdominal pain, constipation, diarrhea, nausea and vomiting.  Endocrine: Negative for polydipsia and polyuria.  Genitourinary:  Negative for dysuria and hematuria.  Musculoskeletal:  Negative for neck pain and neck stiffness.  Skin:  Negative for rash.  Neurological:  Negative for dizziness, weakness and headaches.  Psychiatric/Behavioral:  Negative for agitation and behavioral problems.      Objective:    Physical Exam Vitals reviewed.  Constitutional:      General: She is not in acute distress.    Appearance: She is not diaphoretic.  HENT:     Head: Normocephalic and atraumatic.     Nose: Nose normal. No congestion.     Mouth/Throat:     Mouth: Mucous membranes are moist.     Pharynx: No posterior oropharyngeal erythema.  Eyes:     General: No scleral icterus.    Extraocular Movements: Extraocular movements intact.  Cardiovascular:     Rate and Rhythm: Normal rate and regular rhythm.     Pulses: Normal pulses.     Heart sounds: Normal heart sounds. No murmur heard. Pulmonary:     Breath sounds: Normal breath sounds.  No wheezing or rales.  Musculoskeletal:     Cervical back: Neck supple. No tenderness.     Right lower leg: No edema.     Left lower leg: No edema.  Skin:    General: Skin is warm.     Findings: No rash.  Neurological:     General: No focal deficit present.     Mental Status: She is alert and oriented to person, place, and time.  Sensory: No sensory deficit.     Motor: No weakness.  Psychiatric:        Mood and Affect: Mood normal.        Behavior: Behavior normal.    BP 140/70 (BP Location: Left Arm, Patient Position: Sitting, Cuff Size: Normal)   Pulse 78   Ht 5\' 2"  (1.575 m)   Wt 98 lb (44.5 kg)   SpO2 98%   BMI 17.92 kg/m  Wt Readings from Last 3 Encounters:  07/05/20 98 lb (44.5 kg)  04/27/20 101 lb (45.8 kg)  04/06/20 97 lb 6.4 oz (44.2 kg)     Health Maintenance Due  Topic Date Due   HEMOGLOBIN A1C  Never done   URINE MICROALBUMIN  Never done   DEXA SCAN  Never done   COVID-19 Vaccine (3 - Booster for Pfizer series) 09/14/2019    There are no preventive care reminders to display for this patient.  Lab Results  Component Value Date   TSH 7.204 (H) 01/04/2020   Lab Results  Component Value Date   WBC 3.6 (L) 04/20/2020   HGB 12.6 04/20/2020   HCT 41.2 04/20/2020   MCV 101.5 (H) 04/20/2020   PLT 105 (L) 04/20/2020   Lab Results  Component Value Date   NA 142 04/20/2020   K 3.7 04/20/2020   CO2 25 04/20/2020   GLUCOSE 93 04/20/2020   BUN 19 04/20/2020   CREATININE 1.20 (H) 04/20/2020   BILITOT 0.7 04/20/2020   ALKPHOS 89 04/20/2020   AST 22 04/20/2020   ALT 10 04/20/2020   PROT 7.5 04/20/2020   ALBUMIN 3.9 04/20/2020   CALCIUM 9.3 04/20/2020   ANIONGAP 8 04/20/2020   No results found for: CHOL No results found for: HDL No results found for: LDLCALC No results found for: TRIG No results found for: CHOLHDL No results found for: HGBA1C    Assessment & Plan:   Problem List Items Addressed This Visit       Cardiovascular and  Mediastinum   Essential hypertension    BP Readings from Last 1 Encounters:  07/05/20 140/70  Well-controlled with Felodipine Due to new onset ankle edema, will decrease dose of Felodipine to 5 mg QD Added Valsartan 40 mg QD Will try to adjust dose of Valsartan and DC Felodipine later to avoid polypharmacy Counseled for compliance with the medications Advised DASH diet and moderate exercise/walking as tolerated       Relevant Medications   felodipine (PLENDIL) 5 MG 24 hr tablet   valsartan (DIOVAN) 40 MG tablet   Other Relevant Orders   TSH + free T4   Basic Metabolic Panel (BMET)     Endocrine   Hypothyroidism - Primary    Lab Results  Component Value Date   TSH 7.204 (H) 01/04/2020  On Levothyroxine 25 mcg QD Check TSH before the next visit         Nervous and Auditory   Late onset Alzheimer's dementia without behavioral disturbance (HCC)    On Donepezil 5 mg QD        Meds ordered this encounter  Medications   felodipine (PLENDIL) 5 MG 24 hr tablet    Sig: Take 2 tablets (10 mg total) by mouth daily.    Dispense:  90 tablet    Refill:  0   valsartan (DIOVAN) 40 MG tablet    Sig: Take 1 tablet (40 mg total) by mouth daily.    Dispense:  90 tablet    Refill:  0    Follow-up: Return in about 3 months (around 10/05/2020) for HTN and hypothyroidism.    [name], MD

## 2020-07-05 NOTE — Assessment & Plan Note (Signed)
On Donepezil 5 mg QD

## 2020-07-05 NOTE — Assessment & Plan Note (Signed)
BP Readings from Last 1 Encounters:  07/05/20 140/70   Well-controlled with Felodipine Due to new onset ankle edema, will decrease dose of Felodipine to 5 mg QD Added Valsartan 40 mg QD Will try to adjust dose of Valsartan and DC Felodipine later to avoid polypharmacy Counseled for compliance with the medications Advised DASH diet and moderate exercise/walking as tolerated

## 2020-07-05 NOTE — Patient Instructions (Signed)
Please start taking Felodipine 5 mg instead of 10 mg. Start taking Valsartan as prescribed.  Please get fasting blood tests done after 2 weeks of starting Valsartan.

## 2020-07-05 NOTE — Assessment & Plan Note (Signed)
Lab Results  Component Value Date   TSH 7.204 (H) 01/04/2020   On Levothyroxine 25 mcg QD Check TSH before the next visit

## 2020-07-17 DIAGNOSIS — B351 Tinea unguium: Secondary | ICD-10-CM | POA: Diagnosis not present

## 2020-07-17 DIAGNOSIS — I739 Peripheral vascular disease, unspecified: Secondary | ICD-10-CM | POA: Diagnosis not present

## 2020-07-19 DIAGNOSIS — I10 Essential (primary) hypertension: Secondary | ICD-10-CM | POA: Diagnosis not present

## 2020-07-20 LAB — BASIC METABOLIC PANEL
BUN/Creatinine Ratio: 16 (ref 12–28)
BUN: 16 mg/dL (ref 10–36)
CO2: 24 mmol/L (ref 20–29)
Calcium: 9.1 mg/dL (ref 8.7–10.3)
Chloride: 105 mmol/L (ref 96–106)
Creatinine, Ser: 1.02 mg/dL — ABNORMAL HIGH (ref 0.57–1.00)
Glucose: 94 mg/dL (ref 65–99)
Potassium: 3.7 mmol/L (ref 3.5–5.2)
Sodium: 145 mmol/L — ABNORMAL HIGH (ref 134–144)
eGFR: 50 mL/min/{1.73_m2} — ABNORMAL LOW (ref >59)

## 2020-07-20 LAB — TSH+FREE T4
Free T4: 1.5 ng/dL (ref 0.82–1.77)
TSH: 3.88 u[IU]/mL (ref 0.450–4.500)

## 2020-07-28 ENCOUNTER — Inpatient Hospital Stay (HOSPITAL_COMMUNITY): Payer: PPO | Attending: Oncology

## 2020-07-28 ENCOUNTER — Other Ambulatory Visit: Payer: Self-pay

## 2020-07-28 VITALS — BP 167/70 | HR 82 | Temp 97.2°F | Resp 16

## 2020-07-28 DIAGNOSIS — E538 Deficiency of other specified B group vitamins: Secondary | ICD-10-CM | POA: Insufficient documentation

## 2020-07-28 DIAGNOSIS — D510 Vitamin B12 deficiency anemia due to intrinsic factor deficiency: Secondary | ICD-10-CM

## 2020-07-28 DIAGNOSIS — D509 Iron deficiency anemia, unspecified: Secondary | ICD-10-CM

## 2020-07-28 MED ORDER — CYANOCOBALAMIN 1000 MCG/ML IJ SOLN
INTRAMUSCULAR | Status: AC
  Filled 2020-07-28: qty 1

## 2020-07-28 MED ORDER — CYANOCOBALAMIN 1000 MCG/ML IJ SOLN
1000.0000 ug | Freq: Once | INTRAMUSCULAR | Status: AC
  Administered 2020-07-28: 1000 ug via INTRAMUSCULAR

## 2020-07-28 NOTE — Patient Instructions (Signed)
[place]  Discharge Instructions: Thank you for choosing [facility] to provide your oncology and hematology care.  If you have a lab appointment with the [facility], please come in thru the Main Entrance and check in at the main information desk.  Wear comfortable clothing and clothing appropriate for easy access to any Portacath or PICC line.   We strive to give you quality time with your provider. You may need to reschedule your appointment if you arrive late (15 or more minutes).  Arriving late affects you and other patients whose appointments are after yours.  Also, if you miss three or more appointments without notifying the office, you may be dismissed from the clinic at the provider's discretion.      For prescription refill requests, have your pharmacy contact our office and allow 72 hours for refills to be completed.    You received B12 shot today.  BELOW ARE SYMPTOMS THAT SHOULD BE REPORTED IMMEDIATELY: *FEVER GREATER THAN 100.4 F (38 C) OR HIGHER *CHILLS OR SWEATING *NAUSEA AND VOMITING THAT IS NOT CONTROLLED WITH YOUR NAUSEA MEDICATION *UNUSUAL SHORTNESS OF BREATH *UNUSUAL BRUISING OR BLEEDING *URINARY PROBLEMS (pain or burning when urinating, or frequent urination) *BOWEL PROBLEMS (unusual diarrhea, constipation, pain near the anus) TENDERNESS IN MOUTH AND THROAT WITH OR WITHOUT PRESENCE OF ULCERS (sore throat, sores in mouth, or a toothache) UNUSUAL RASH, SWELLING OR PAIN  UNUSUAL VAGINAL DISCHARGE OR ITCHING   Items with * indicate a potential emergency and should be followed up as soon as possible or go to the Emergency Department if any problems should occur.  Please show the CHEMOTHERAPY ALERT CARD or IMMUNOTHERAPY ALERT CARD at check-in to the Emergency Department and triage nurse.  Should you have questions after your visit or need to cancel or reschedule your appointment, please contact [facility] [phone]  and  follow the prompts.  Office hours are 8:00 a.m. to 4:30 p.m. Monday - Friday. Please note that voicemails left after 4:00 p.m. may not be returned until the following business day.  We are closed weekends and major holidays. You have access to a nurse at all times for urgent questions. Please call the main number to the clinic [phone] and follow the prompts.  For any non-urgent questions, you may also contact your provider using MyChart. We now offer e-Visits for anyone 33 and older to request care online for non-urgent symptoms. For details visit [email or web address].   Also download the MyChart app! Go to the app store, search "MyChart", open the app, select [place], and log in with your MyChart username and password.  Due to Covid, a mask is required upon entering the hospital/clinic. If you do not have a mask, one will be given to you upon arrival. For doctor visits, patients may have 1 support person aged 67 or older with them. For treatment visits, patients cannot have anyone with them due to current Covid guidelines and our immunocompromised population.

## 2020-07-28 NOTE — Progress Notes (Signed)
..  [name] presents today for injection per the provider's orders.  B12 administration without incident; injection site WNL; see MAR for injection details.  Patient tolerated procedure well and without incident.  No questions or complaints noted at this time. Pt stable during and after treatment. Pt discharged ambulatory in satisfactory condition.

## 2020-08-28 ENCOUNTER — Other Ambulatory Visit: Payer: Self-pay

## 2020-08-28 ENCOUNTER — Inpatient Hospital Stay (HOSPITAL_COMMUNITY): Payer: PPO

## 2020-08-28 ENCOUNTER — Encounter (HOSPITAL_COMMUNITY): Payer: Self-pay

## 2020-08-28 ENCOUNTER — Inpatient Hospital Stay (HOSPITAL_COMMUNITY): Payer: PPO | Attending: Hematology

## 2020-08-28 VITALS — BP 180/80 | HR 80 | Temp 97.1°F | Resp 16

## 2020-08-28 DIAGNOSIS — D509 Iron deficiency anemia, unspecified: Secondary | ICD-10-CM

## 2020-08-28 DIAGNOSIS — R634 Abnormal weight loss: Secondary | ICD-10-CM | POA: Insufficient documentation

## 2020-08-28 DIAGNOSIS — D3A092 Benign carcinoid tumor of the stomach: Secondary | ICD-10-CM

## 2020-08-28 DIAGNOSIS — E538 Deficiency of other specified B group vitamins: Secondary | ICD-10-CM | POA: Insufficient documentation

## 2020-08-28 DIAGNOSIS — Z207 Contact with and (suspected) exposure to pediculosis, acariasis and other infestations: Secondary | ICD-10-CM | POA: Diagnosis not present

## 2020-08-28 DIAGNOSIS — D510 Vitamin B12 deficiency anemia due to intrinsic factor deficiency: Secondary | ICD-10-CM

## 2020-08-28 LAB — FERRITIN: Ferritin: 155 ng/mL (ref 11–307)

## 2020-08-28 LAB — COMPREHENSIVE METABOLIC PANEL
ALT: 10 U/L (ref 0–44)
AST: 22 U/L (ref 15–41)
Albumin: 4.5 g/dL (ref 3.5–5.0)
Alkaline Phosphatase: 102 U/L (ref 38–126)
Anion gap: 7 (ref 5–15)
BUN: 15 mg/dL (ref 8–23)
CO2: 28 mmol/L (ref 22–32)
Calcium: 9 mg/dL (ref 8.9–10.3)
Chloride: 105 mmol/L (ref 98–111)
Creatinine, Ser: 1.04 mg/dL — ABNORMAL HIGH (ref 0.44–1.00)
GFR, Estimated: 49 mL/min — ABNORMAL LOW (ref >60)
Glucose, Bld: 139 mg/dL — ABNORMAL HIGH (ref 70–99)
Potassium: 3.4 mmol/L — ABNORMAL LOW (ref 3.5–5.1)
Sodium: 140 mmol/L (ref 135–145)
Total Bilirubin: 1 mg/dL (ref 0.3–1.2)
Total Protein: 8.1 g/dL (ref 6.5–8.1)

## 2020-08-28 LAB — CBC WITH DIFFERENTIAL/PLATELET
Abs Immature Granulocytes: 0.02 10*3/uL (ref 0.00–0.07)
Basophils Absolute: 0 10*3/uL (ref 0.0–0.1)
Basophils Relative: 0 %
Eosinophils Absolute: 0.1 10*3/uL (ref 0.0–0.5)
Eosinophils Relative: 1 %
HCT: 42.7 % (ref 36.0–46.0)
Hemoglobin: 13.6 g/dL (ref 12.0–15.0)
Immature Granulocytes: 0 %
Lymphocytes Relative: 17 %
Lymphs Abs: 0.8 10*3/uL (ref 0.7–4.0)
MCH: 32.9 pg (ref 26.0–34.0)
MCHC: 31.9 g/dL (ref 30.0–36.0)
MCV: 103.4 fL — ABNORMAL HIGH (ref 80.0–100.0)
Monocytes Absolute: 0.3 10*3/uL (ref 0.1–1.0)
Monocytes Relative: 6 %
Neutro Abs: 3.6 10*3/uL (ref 1.7–7.7)
Neutrophils Relative %: 76 %
Platelets: 149 10*3/uL — ABNORMAL LOW (ref 150–400)
RBC: 4.13 MIL/uL (ref 3.87–5.11)
RDW: 12.2 % (ref 11.5–15.5)
WBC: 4.8 10*3/uL (ref 4.0–10.5)
nRBC: 0 % (ref 0.0–0.2)

## 2020-08-28 LAB — IRON AND TIBC
Iron: 84 ug/dL (ref 28–170)
Saturation Ratios: 28 % (ref 10.4–31.8)
TIBC: 295 ug/dL (ref 250–450)
UIBC: 211 ug/dL

## 2020-08-28 MED ORDER — CYANOCOBALAMIN 1000 MCG/ML IJ SOLN
1000.0000 ug | Freq: Once | INTRAMUSCULAR | Status: AC
  Administered 2020-08-28: 1000 ug via INTRAMUSCULAR
  Filled 2020-08-28: qty 1

## 2020-08-28 NOTE — Patient Instructions (Signed)
[place]  Discharge Instructions: Thank you for choosing [facility] to provide your oncology and hematology care.  If you have a lab appointment with the [facility], please come in thru the Main Entrance and check in at the main information desk.  Wear comfortable clothing and clothing appropriate for easy access to any Portacath or PICC line.   We strive to give you quality time with your provider. You may need to reschedule your appointment if you arrive late (15 or more minutes).  Arriving late affects you and other patients whose appointments are after yours.  Also, if you miss three or more appointments without notifying the office, you may be dismissed from the clinic at the provider's discretion.      For prescription refill requests, have your pharmacy contact our office and allow 72 hours for refills to be completed.    Today you received the following chemotherapy and/or immunotherapy agents vitamin b12 shot today.       To help prevent nausea and vomiting after your treatment, we encourage you to take your nausea medication as directed.  BELOW ARE SYMPTOMS THAT SHOULD BE REPORTED IMMEDIATELY: *FEVER GREATER THAN 100.4 F (38 C) OR HIGHER *CHILLS OR SWEATING *NAUSEA AND VOMITING THAT IS NOT CONTROLLED WITH YOUR NAUSEA MEDICATION *UNUSUAL SHORTNESS OF BREATH *UNUSUAL BRUISING OR BLEEDING *URINARY PROBLEMS (pain or burning when urinating, or frequent urination) *BOWEL PROBLEMS (unusual diarrhea, constipation, pain near the anus) TENDERNESS IN MOUTH AND THROAT WITH OR WITHOUT PRESENCE OF ULCERS (sore throat, sores in mouth, or a toothache) UNUSUAL RASH, SWELLING OR PAIN  UNUSUAL VAGINAL DISCHARGE OR ITCHING   Items with * indicate a potential emergency and should be followed up as soon as possible or go to the Emergency Department if any problems should occur.  Please show the CHEMOTHERAPY ALERT CARD or IMMUNOTHERAPY ALERT CARD at check-in to the  Emergency Department and triage nurse.  Should you have questions after your visit or need to cancel or reschedule your appointment, please contact [facility] [phone]  and follow the prompts.  Office hours are 8:00 a.m. to 4:30 p.m. Monday - Friday. Please note that voicemails left after 4:00 p.m. may not be returned until the following business day.  We are closed weekends and major holidays. You have access to a nurse at all times for urgent questions. Please call the main number to the clinic [phone] and follow the prompts.  For any non-urgent questions, you may also contact your provider using MyChart. We now offer e-Visits for anyone 53 and older to request care online for non-urgent symptoms. For details visit [email or web address].   Also download the MyChart app! Go to the app store, search "MyChart", open the app, select [place], and log in with your MyChart username and password.  Due to Covid, a mask is required upon entering the hospital/clinic. If you do not have a mask, one will be given to you upon arrival. For doctor visits, patients may have 1 support person aged 47 or older with them. For treatment visits, patients cannot have anyone with them due to current Covid guidelines and our immunocompromised population.

## 2020-08-28 NOTE — Progress Notes (Signed)
Patient tolerated injection with no complaints voiced.  Site clean and dry with no bruising or swelling noted at site.  See MAR for details.  Band aid applied.  Patient stable during and after injection.  Vss with discharge and left in satisfactory condition with no s/s of distress noted.  

## 2020-08-29 LAB — CHROMOGRANIN A: Chromogranin A (ng/mL): 713.7 ng/mL — ABNORMAL HIGH (ref 0.0–101.8)

## 2020-09-01 NOTE — Progress Notes (Signed)
[place], [place] [address]   CLINIC:  Medical Oncology/Hematology  PCP:  [name], MD [address] [place] [address] [phone]   REASON FOR VISIT:  Follow-up for neuroendocrine carcinoid of the stomach and IDA  CURRENT THERAPY: Surveillance of gastric carcinoid tumor, intermittent Feraheme for IDA  INTERVAL HISTORY:  [name] 87 y.o. female returns for routine follow-up of her neuroendocrine carcinoid tumor of the stomach and her iron deficiency anemia.  She was last seen by Dr. Delton Coombes on 04/27/2020.   At today's visit, she reports that she is feeling well.  No recent hospitalizations, surgeries, or changes in baseline health status.  She reports good energy, and denies any chest pain, dyspnea on exertion, palpitations, or syncope.  She denies any signs or symptoms of blood loss such as epistaxis, hematemesis, hematochezia, or melena.  She denies any symptoms of carcinoid syndrome; no wheezing, abdominal pain or cramping, diarrhea, or skin flushing.  No B symptoms such as fever, chills, night sweats, unintentional weight loss.  She has 80% energy and 100% appetite. She endorses that she is maintaining a stable weight.    REVIEW OF SYSTEMS:  Review of Systems  Constitutional:  Negative for appetite change, chills, diaphoresis, fatigue, fever and unexpected weight change.  HENT:   Negative for lump/mass and nosebleeds.   Eyes:  Negative for eye problems.  Respiratory:  Negative for cough, hemoptysis and shortness of breath.   Cardiovascular:  Negative for chest pain, leg swelling and palpitations.  Gastrointestinal:  Negative for abdominal pain, blood in stool, constipation, diarrhea, nausea and vomiting.  Genitourinary:  Negative for hematuria.   Skin: Negative.   Neurological:  Negative for dizziness, headaches and light-headedness.  Hematological:  Does not bruise/bleed easily.     PAST MEDICAL/SURGICAL  HISTORY:  Past Medical History:  Diagnosis Date   Adult BMI 19-24 kg/sq m 2008 125 lbs   Anemia    Pernicious 2008-HB 12 MCV 119.5; JAN 2012 HB 13.2 MCV 97.6   Carcinoid tumor of stomach 2008   Diverticulosis    GERD (gastroesophageal reflux disease)    Hiatal hernia    HTN (hypertension)    Hyperlipidemia    Hypertension    Phreesia 02/07/2020   Mild memory loss 02/15/2011   Near syncope 04/18/2014   Pernicious anemia    Pernicious anemia 06/14/2010   Wrist fracture, left 2008   Past Surgical History:  Procedure Laterality Date   COLONOSCOPY  2003 LS   DC/[place] Diverticulosis   EUS  2008 DJ   FNA NO CARCINOID   EUS  JAN 2009   NL EXAM, NO Bx   EUS  [facility] WO   CARCINOID   UPPER GASTROINTESTINAL ENDOSCOPY  APR 2008 SLF   CARCIONOID   UPPER GASTROINTESTINAL ENDOSCOPY  FEB 2011 [facility]   CARCINOID/mild anemia/large hiatal hernia     SOCIAL HISTORY:  Social History   Socioeconomic History   Marital status: Widowed    Spouse name: Not on file   Number of children: 1   Years of education: Not on file   Highest education level: Not on file  Occupational History   Not on file  Tobacco Use   Smoking status: Never   Smokeless tobacco: Never  Vaping Use   Vaping Use: Never used  Substance and Sexual Activity   Alcohol use: No   Drug use: No   Sexual activity: Not Currently    Birth control/protection: Post-menopausal  Other Topics Concern   Not on file  Social History Narrative   Not on file   Social Determinants of Health   Financial Resource Strain: Low Risk    Difficulty of Paying Living Expenses: Not hard at all  Food Insecurity: No Food Insecurity   Worried About Charity fundraiser in the Last Year: Never true   [place] in the Last Year: Never true  Transportation Needs: No Transportation Needs   Lack of Transportation (Medical): No   Lack of Transportation (Non-Medical): No  Physical Activity: Inactive   Days of Exercise per Week: 0 days    Minutes of Exercise per Session: 0 min  Stress: No Stress Concern Present   Feeling of Stress : Not at all  Social Connections: Socially Isolated   Frequency of Communication with Friends and Family: More than three times a week   Frequency of Social Gatherings with Friends and Family: More than three times a week   Attends Religious Services: Never   Marine scientist or Organizations: No   Attends Archivist Meetings: Never   Marital Status: Widowed  Human resources officer Violence: Not At Risk   Fear of Current or Ex-Partner: No   Emotionally Abused: No   Physically Abused: No   Sexually Abused: No    FAMILY HISTORY:  Family History  Problem Relation Age of Onset   Cancer Mother    Heart disease Brother    Colon cancer Neg Hx    Colon polyps Neg Hx     CURRENT MEDICATIONS:  Outpatient Encounter Medications as of 09/04/2020  Medication Sig   acetaminophen (TYLENOL) 500 MG tablet Take 500 mg by mouth every 6 (six) hours as needed. For pain   donepezil (ARICEPT) 5 MG tablet Take 1 tablet (5 mg total) by mouth at bedtime.   felodipine (PLENDIL) 5 MG 24 hr tablet Take 2 tablets (10 mg total) by mouth daily.   levothyroxine (SYNTHROID) 25 MCG tablet Take 1 tablet (25 mcg total) by mouth daily before breakfast.   simvastatin (ZOCOR) 40 MG tablet Take 40 mg by mouth daily.   valsartan (DIOVAN) 40 MG tablet Take 1 tablet (40 mg total) by mouth daily.   No facility-administered encounter medications on file as of 09/04/2020.    ALLERGIES:  Allergies  Allergen Reactions   Codeine      PHYSICAL EXAM:  ECOG PERFORMANCE STATUS: 0 - Asymptomatic  There were no vitals filed for this visit. There were no vitals filed for this visit. Physical Exam Constitutional:      Appearance: Normal appearance. She is underweight.  HENT:     Head: Normocephalic and atraumatic.     Mouth/Throat:     Mouth: Mucous membranes are moist.  Eyes:     Extraocular Movements: Extraocular  movements intact.     Pupils: Pupils are equal, round, and reactive to light.  Cardiovascular:     Rate and Rhythm: Normal rate and regular rhythm.     Pulses: Normal pulses.     Heart sounds: Normal heart sounds.  Pulmonary:     Effort: Pulmonary effort is normal.     Breath sounds: Normal breath sounds.  Abdominal:     General: Bowel sounds are normal.     Palpations: Abdomen is soft.     Tenderness: There is no abdominal tenderness.  Musculoskeletal:        General: No swelling.     Right lower leg: No edema.  Left lower leg: No edema.  Lymphadenopathy:     Cervical: No cervical adenopathy.  Skin:    General: Skin is warm and dry.  Neurological:     General: No focal deficit present.     Mental Status: She is alert and oriented to person, place, and time.  Psychiatric:        Mood and Affect: Mood normal.        Behavior: Behavior normal.     LABORATORY DATA:  I have reviewed the labs as listed.  CBC    Component Value Date/Time   WBC 4.8 08/28/2020 1046   RBC 4.13 08/28/2020 1046   HGB 13.6 08/28/2020 1046   HCT 42.7 08/28/2020 1046   PLT 149 (L) 08/28/2020 1046   MCV 103.4 (H) 08/28/2020 1046   MCH 32.9 08/28/2020 1046   MCHC 31.9 08/28/2020 1046   RDW 12.2 08/28/2020 1046   LYMPHSABS 0.8 08/28/2020 1046   MONOABS 0.3 08/28/2020 1046   EOSABS 0.1 08/28/2020 1046   BASOSABS 0.0 08/28/2020 1046   CMP Latest Ref Rng & Units 08/28/2020 07/19/2020 04/20/2020  Glucose 70 - 99 mg/dL 139(H) 94 93  BUN 8 - 23 mg/dL '15 16 19  '$ Creatinine 0.44 - 1.00 mg/dL 1.04(H) 1.02(H) 1.20(H)  Sodium 135 - 145 mmol/L 140 145(H) 142  Potassium 3.5 - 5.1 mmol/L 3.4(L) 3.7 3.7  Chloride 98 - 111 mmol/L 105 105 109  CO2 22 - 32 mmol/L '28 24 25  '$ Calcium 8.9 - 10.3 mg/dL 9.0 9.1 9.3  Total Protein 6.5 - 8.1 g/dL 8.1 - 7.5  Total Bilirubin 0.3 - 1.2 mg/dL 1.0 - 0.7  Alkaline Phos 38 - 126 U/L 102 - 89  AST 15 - 41 U/L 22 - 22  ALT 0 - 44 U/L 10 - 10    DIAGNOSTIC IMAGING:  I  have independently reviewed the relevant imaging and discussed with the patient.  ASSESSMENT & PLAN: 1.  Well-differentiated type 1 gastric carcinoid - EGD biopsy of the stomach in 2011 consistent with carcinoid tumor. - She has been on active surveillance since 2011 - She does not have any symptoms of carcinoid syndrome.  No B symptoms. - CT CAP from 02/08/2020 which did not show any evidence of metastatic carcinoid.  Several small stable pulmonary nodules are present.  No obvious gastric mass or other indicator of active neuroendocrine tumor. - At last visit, Dr. Delton Coombes discussed about incidental finding (CT CAP 02/08/2020) of for complex left renal lesions, particularly the left upper pole lesion which could represent a cystic renal cell carcinoma.  However given her advanced age, we chose to follow-up at this time rather than pursue active treatment. - Most recent Chromogranin A level 713.7 (was 813 on 01/17/2020) - PLAN:  RTC in 6 months with repeat labs including chromogranin level.  Check CT abdomen/pelvis before next visit to follow-up on renal lesions.  2.  Weight loss - Patient reports good appetite and denies any unintentional weight loss, however she is noted to have lost 5 to 10 pounds in the past 6 months - Low BMI of 16.63 (09/04/2020) - PLAN: Referral to dietician.  Continue to monitor at follow-up appointments  3.  B12 deficiency with history of pernicious anemia: - She is on monthly B12 injections. - Most recent labs (01/17/2020): B12 normal at 240 - PLAN: Continue monthly B12 injections.  Recheck B12 and methylmalonic acid with repeat labs and RTC in 6 months.   4.  Iron deficiency state: -  She is receiving intermittent iron infusions.  (Last with IV Feraheme x2 in January 2022) - She denies any signs or symptoms of blood loss such as bright red blood per rectum or melena - Suspect malabsorption secondary to chronic atrophic gastritis and pernicious anemia - Most recent  labs (08/28/2020): Hgb 13.6, ferritin 155, iron saturation 28% - PLAN: No indication for IV iron at this time.  Repeat labs and RTC in 6 months.  5.  Bedbugs - Bedbug noted on patient's clothes during exam, removed and disposed of - Patient informed and recommended to seek assistance from exterminator for her home - Patient's exam room cleaned per facility protocol to prevent spread of bedbug infestation   PLAN SUMMARY & DISPOSITION: - Continue monthly B12 injections - Referral to dietitian - Labs in 6 months - CT abdomen/pelvis in 6 months - RTC after labs and CT  All questions were answered. The patient knows to call the clinic with any problems, questions or concerns.  Medical decision making: Moderate  Time spent on visit: I spent 20 minutes counseling the patient face to face. The total time spent in the appointment was 30 minutes and more than 50% was on counseling.   Harriett Rush, PA-C  09/04/2020 12:58 PM

## 2020-09-04 ENCOUNTER — Inpatient Hospital Stay (HOSPITAL_BASED_OUTPATIENT_CLINIC_OR_DEPARTMENT_OTHER): Payer: PPO | Admitting: Physician Assistant

## 2020-09-04 ENCOUNTER — Other Ambulatory Visit: Payer: Self-pay

## 2020-09-04 VITALS — BP 158/66 | HR 84 | Temp 97.9°F | Resp 16 | Wt 90.9 lb

## 2020-09-04 DIAGNOSIS — E538 Deficiency of other specified B group vitamins: Secondary | ICD-10-CM | POA: Diagnosis not present

## 2020-09-04 DIAGNOSIS — R636 Underweight: Secondary | ICD-10-CM | POA: Diagnosis not present

## 2020-09-04 DIAGNOSIS — N2889 Other specified disorders of kidney and ureter: Secondary | ICD-10-CM

## 2020-09-04 DIAGNOSIS — D3A092 Benign carcinoid tumor of the stomach: Secondary | ICD-10-CM

## 2020-09-04 DIAGNOSIS — D510 Vitamin B12 deficiency anemia due to intrinsic factor deficiency: Secondary | ICD-10-CM | POA: Diagnosis not present

## 2020-09-04 DIAGNOSIS — D509 Iron deficiency anemia, unspecified: Secondary | ICD-10-CM | POA: Diagnosis not present

## 2020-09-04 NOTE — Patient Instructions (Addendum)
[place] at [facility] Discharge Instructions  You were seen today by Tarri Abernethy PA-C for your iron deficiency anemia and history of gastric carcinoid tumor.  Your blood and iron levels looked great.  You do not need any IV iron today.  We will check your levels again and see you back in 6 months.  Regarding your gastric carcinoid tumor, your tumor marker labs are stable.  We will check them again at your follow-up visit in 6 months.  Continue your monthly B12 injections, which are next due in September.  Your weight is a little bit low, please make sure that you eat adequate nutrition.  We have sent a referral for our nutritionist to see you as well.  Please note, we did find a BEDBUG on your clothes during your visit.  We recommend that you reach out to a local exterminator for advice on how to get rid of possible bedbugs in your home.  LABS: Return in 6 months for repeat labs  OTHER TESTS: None at this time  MEDICATIONS: No changes to home medications  FOLLOW-UP APPOINTMENT: Office visit in 6 months (1 week after labs)   Thank you for choosing [place] at [facility] to provide your oncology and hematology care.  To afford each patient quality time with our provider, please arrive at least 15 minutes before your scheduled appointment time.   If you have a lab appointment with the [facility] please come in thru the Main Entrance and check in at the main information desk.  You need to re-schedule your appointment should you arrive 10 or more minutes late.  We strive to give you quality time with our providers, and arriving late affects you and other patients whose appointments are after yours.  Also, if you no show three or more times for appointments you may be dismissed from the clinic at the providers discretion.     Again, thank you for choosing [facility].  Our hope is that these requests will decrease the  amount of time that you wait before being seen by our physicians.       _____________________________________________________________  Should you have questions after your visit to [facility], please contact our office at [phone] and follow the prompts.  Our office hours are 8:00 a.m. and 4:30 p.m. Monday - Friday.  Please note that voicemails left after 4:00 p.m. may not be returned until the following business day.  We are closed weekends and major holidays.  You do have access to a nurse 24-7, just call the main number to the clinic [phone] and do not press any options, hold on the line and a nurse will answer the phone.    For prescription refill requests, have your pharmacy contact our office and allow 72 hours.    Due to Covid, you will need to wear a mask upon entering the hospital. If you do not have a mask, a mask will be given to you at the Main Entrance upon arrival. For doctor visits, patients may have 1 support person age 85 or older with them. For treatment visits, patients can not have anyone with them due to social distancing guidelines and our immunocompromised population.

## 2020-09-11 ENCOUNTER — Other Ambulatory Visit: Payer: Self-pay

## 2020-09-11 ENCOUNTER — Inpatient Hospital Stay (HOSPITAL_COMMUNITY): Payer: PPO | Admitting: Dietician

## 2020-09-11 NOTE — Progress Notes (Signed)
Nutrition Assessment   Reason for Assessment: Provider request   ASSESSMENT: 87 year old female with gastric carcinoid tumor not currently undergoing treatment. She has history of  IDA, receiving intermittent Feraheme.  Past medical history includes HTN, hypothyroidism, late onset Alzheimer's dementia, AKI, GERD, HLD, hiatal hernia, wears dentures.  Met with patient in clinic. She reports her appetite is good and active. Patient reports she lives with her son, but is independent. She continues to drive, states everyone calls her "miss busy" Patient says she always eats breakfast, usually 1 egg and 1 piece of sausage or bacon, toast. She typically does not eat lunch, but sometimes has a burger or peanut butter sandwich. Patient unable to recall what she ate for dinner last night, says she likes hamburgers or goes out for nuggets. Patient reports she is not a water drinker, but her son puts "orange stuff" in it and she likes it better. Patient reports 2 bowel movements daily is normal for her, recently they have been more watery. Observed loosely fitting dentures, patient says she takes them out when eating. She denies chewing/swallowing difficulties.    Nutrition Focused Physical Exam: deferred   Medications: Aricept, Plendil, Diovan, Zocor, Synthroid   Labs: 8/8 - K 3.4, Glucose 139, Cr 1.04  Anthropometrics: Weight 90 lb 14.4 oz on 8/15 decreased ~7 lbs (7.1%) from 98 lb on 6/15. This is significant for time frame.  Height: 5'2" Weight: 41.2 kg (8/15) UBW: 95-101 lb (Jan-June 2022) BMI: 16.63  Estimated needs:  Kcal: 1300-1500 Protein: 54-66 grams Fluid: 1.3 L  NUTRITION DIAGNOSIS: Inadequate oral intake related to chronic illness (gastric carcinoid tumor, Alzheimer's dementia) as evidenced by dietary recall insufficient to meet >/= 75% of needs, significant 7 % (7 lb) weight loss in 2 months.     INTERVENTION:  Educated on importance of adequate calorie and protein energy  intake to maintain strength, weights, nutrition Discussed eating small meals and snacks, provided ideas for high calorie, high protein snacks - handout provided Recommended drinking 1-2 Ensure Plus/equivalent daily, provided samples of Boost Plus and Ensure Complete for pt to try as well as coupons Discussed easy to chew and swallow foods, handout provided Discussed visit and recommendations with son in waiting area, patient and son appreciative Contact information provided    MONITORING, EVALUATION, GOAL: Patient will tolerate increased calories and protein to promote weight stability   Next Visit: Monday October 10 during injection

## 2020-09-29 ENCOUNTER — Inpatient Hospital Stay (HOSPITAL_COMMUNITY): Payer: PPO | Attending: Oncology

## 2020-09-29 ENCOUNTER — Other Ambulatory Visit: Payer: Self-pay

## 2020-09-29 VITALS — BP 157/71 | HR 71 | Temp 97.3°F | Resp 19

## 2020-09-29 DIAGNOSIS — E538 Deficiency of other specified B group vitamins: Secondary | ICD-10-CM | POA: Insufficient documentation

## 2020-09-29 DIAGNOSIS — D509 Iron deficiency anemia, unspecified: Secondary | ICD-10-CM

## 2020-09-29 DIAGNOSIS — D510 Vitamin B12 deficiency anemia due to intrinsic factor deficiency: Secondary | ICD-10-CM

## 2020-09-29 MED ORDER — CYANOCOBALAMIN 1000 MCG/ML IJ SOLN
1000.0000 ug | Freq: Once | INTRAMUSCULAR | Status: AC
  Administered 2020-09-29: 1000 ug via INTRAMUSCULAR
  Filled 2020-09-29: qty 1

## 2020-09-29 NOTE — Progress Notes (Signed)
B12 injection  given per orders. Patient tolerated it well without problems. Vitals stable and discharged home from clinic ambulatory. Follow up as scheduled.  

## 2020-09-29 NOTE — Patient Instructions (Signed)
[facility]  Discharge Instructions: Thank you for choosing [facility] to provide your oncology and hematology care.  If you have a lab appointment with the Cancer Center, please come in thru the Main Entrance and check in at the main information desk.     We strive to give you quality time with your provider. You may need to reschedule your appointment if you arrive late (15 or more minutes).  Arriving late affects you and other patients whose appointments are after yours.  Also, if you miss three or more appointments without notifying the office, you may be dismissed from the clinic at the provider's discretion.      For prescription refill requests, have your pharmacy contact our office and allow 72 hours for refills to be completed.     Should you have questions after your visit or need to cancel or reschedule your appointment, please contact [facility] [phone]  and follow the prompts.  Office hours are 8:00 a.m. to 4:30 p.m. Monday - Friday. Please note that voicemails left after 4:00 p.m. may not be returned until the following business day.  We are closed weekends and major holidays. You have access to a nurse at all times for urgent questions. Please call the main number to the clinic [phone] and follow the prompts.  For any non-urgent questions, you may also contact your provider using MyChart. We now offer e-Visits for anyone 18 and older to request care online for non-urgent symptoms. For details visit [email or web address].   Also download the MyChart app! Go to the app store, search "MyChart", open the app, select , and log in with your MyChart username and password.  Due to Covid, a mask is required upon entering the hospital/clinic. If you do not have a mask, one will be given to you upon arrival. For doctor visits, patients may have 1 support person aged 18 or older with them. For treatment visits, patients cannot have  anyone with them due to current Covid guidelines and our immunocompromised population.  

## 2020-10-02 DIAGNOSIS — I739 Peripheral vascular disease, unspecified: Secondary | ICD-10-CM | POA: Diagnosis not present

## 2020-10-02 DIAGNOSIS — B351 Tinea unguium: Secondary | ICD-10-CM | POA: Diagnosis not present

## 2020-10-05 ENCOUNTER — Encounter: Payer: Self-pay | Admitting: Internal Medicine

## 2020-10-05 ENCOUNTER — Other Ambulatory Visit: Payer: Self-pay

## 2020-10-05 ENCOUNTER — Ambulatory Visit (INDEPENDENT_AMBULATORY_CARE_PROVIDER_SITE_OTHER): Payer: PPO | Admitting: Internal Medicine

## 2020-10-05 VITALS — BP 145/67 | HR 61 | Resp 18 | Ht 60.0 in | Wt 97.1 lb

## 2020-10-05 DIAGNOSIS — R55 Syncope and collapse: Secondary | ICD-10-CM | POA: Diagnosis not present

## 2020-10-05 DIAGNOSIS — E039 Hypothyroidism, unspecified: Secondary | ICD-10-CM

## 2020-10-05 DIAGNOSIS — R42 Dizziness and giddiness: Secondary | ICD-10-CM | POA: Diagnosis not present

## 2020-10-05 DIAGNOSIS — I10 Essential (primary) hypertension: Secondary | ICD-10-CM | POA: Diagnosis not present

## 2020-10-05 DIAGNOSIS — E785 Hyperlipidemia, unspecified: Secondary | ICD-10-CM

## 2020-10-05 MED ORDER — LEVOTHYROXINE SODIUM 25 MCG PO TABS: 25.0000 ug | ORAL_TABLET | Freq: Every day | ORAL | 3 refills | Status: DC

## 2020-10-05 NOTE — Assessment & Plan Note (Signed)
On Simvastatin 

## 2020-10-05 NOTE — Progress Notes (Signed)
Established Patient Office Visit  Subjective:  Patient ID: NOVICE [name], female    DOB: September 09, 1922  Age: 87 y.o. MRN: [id number]  CC:  Chief Complaint  Patient presents with   Follow-up    3 month follow up HTN and hypothyroidism    HPI [name] presents for follow up of HTN and hypothyroidism.  HTN: Her BP is borderline elevated today, but overall stable for her age. She denies any headache, dizziness, chest pain, dyspnea or palpitations.  Hypothyroidism: She has not been getting Levothyroxine as she was told by Pharmacy that she is not due for it yet. Patient and son have been advised to get it from Pharmacy as refills had been sent with initial Rx. She denies any recent change in appetite or weight. Denies overt fatigue.  She had an episode of ?passing out in the car and woke up after she was provided sips of water. She does not recall the incident today. But her son states that she did not c/o any prodromal symptoms. Denies noticing any shaking movements or tongue biting.  Past Medical History:  Diagnosis Date   Adult BMI 19-24 kg/sq m 2008 125 lbs   Anemia    Pernicious 2008-HB 12 MCV 119.5; JAN 2012 HB 13.2 MCV 97.6   Carcinoid tumor of stomach 2008   Diverticulosis    GERD (gastroesophageal reflux disease)    Hiatal hernia    HTN (hypertension)    Hyperlipidemia    Hypertension    Phreesia 02/07/2020   Mild memory loss 02/15/2011   Near syncope 04/18/2014   Pernicious anemia    Pernicious anemia 06/14/2010   Wrist fracture, left 2008    Past Surgical History:  Procedure Laterality Date   COLONOSCOPY  2003 LS   DC/[place] Diverticulosis   EUS  2008 DJ   FNA NO CARCINOID   EUS  JAN 2009   NL EXAM, NO Bx   EUS  [facility] 2011 WO   CARCINOID   UPPER GASTROINTESTINAL ENDOSCOPY  APR 2008 SL[facility]   CARCIONOID   UPPER GASTROINTESTINAL ENDOSCOPY  FEB 2011 [facility]   CARCINOID/mild anemia/large hiatal hernia    Family History  Problem Relation Age of Onset   Cancer Mother     Heart disease Brother    Colon cancer Neg Hx    Colon polyps Neg Hx     Social History   Socioeconomic History   Marital status: Widowed    Spouse name: Not on file   Number of children: 1   Years of education: Not on file   Highest education level: Not on file  Occupational History   Not on file  Tobacco Use   Smoking status: Never   Smokeless tobacco: Never  Vaping Use   Vaping Use: Never used  Substance and Sexual Activity   Alcohol use: No   Drug use: No   Sexual activity: Not Currently    Birth control/protection: Post-menopausal  Other Topics Concern   Not on file  Social History Narrative   Not on file   Social Determinants of Health   Financial Resource Strain: Low Risk    Difficulty of Paying Living Expenses: Not hard at all  Food Insecurity: No Food Insecurity   Worried About Charity fundraiser in the Last Year: Never true   [place] in the Last Year: Never true  Transportation Needs: No Transportation Needs   Lack of Transportation (Medical): No   Lack of Transportation (Non-Medical):  No  Physical Activity: Inactive   Days of Exercise per Week: 0 days   Minutes of Exercise per Session: 0 min  Stress: No Stress Concern Present   Feeling of Stress : Not at all  Social Connections: Socially Isolated   Frequency of Communication with Friends and Family: More than three times a week   Frequency of Social Gatherings with Friends and Family: More than three times a week   Attends Religious Services: Never   Marine scientist or Organizations: No   Attends Archivist Meetings: Never   Marital Status: Widowed  Human resources officer Violence: Not At Risk   Fear of Current or Ex-Partner: No   Emotionally Abused: No   Physically Abused: No   Sexually Abused: No    Outpatient Medications Prior to Visit  Medication Sig Dispense Refill   acetaminophen (TYLENOL) 500 MG tablet Take 500 mg by mouth every 6 (six) hours as needed. For pain      donepezil (ARICEPT) 5 MG tablet Take 1 tablet (5 mg total) by mouth at bedtime. 90 tablet 3   felodipine (PLENDIL) 5 MG 24 hr tablet Take 2 tablets (10 mg total) by mouth daily. 90 tablet 0   simvastatin (ZOCOR) 40 MG tablet Take 40 mg by mouth daily.     valsartan (DIOVAN) 40 MG tablet Take 1 tablet (40 mg total) by mouth daily. 90 tablet 0   levothyroxine (SYNTHROID) 25 MCG tablet Take 1 tablet (25 mcg total) by mouth daily before breakfast. (Patient not taking: Reported on 10/05/2020) 30 tablet 3   No facility-administered medications prior to visit.    Allergies  Allergen Reactions   Codeine     ROS Review of Systems  Constitutional:  Negative for chills and fever.  HENT:  Negative for congestion, sinus pressure, sinus pain and sore throat.   Eyes:  Negative for pain and discharge.  Respiratory:  Negative for cough and shortness of breath.   Cardiovascular:  Negative for chest pain and palpitations.  Gastrointestinal:  Negative for abdominal pain, constipation, diarrhea, nausea and vomiting.  Endocrine: Negative for polydipsia and polyuria.  Genitourinary:  Negative for dysuria and hematuria.  Musculoskeletal:  Negative for neck pain and neck stiffness.  Skin:  Negative for rash.  Neurological:  Negative for dizziness, weakness and headaches.  Psychiatric/Behavioral:  Negative for agitation and behavioral problems.      Objective:    Physical Exam Vitals reviewed.  Constitutional:      General: She is not in acute distress.    Appearance: She is not diaphoretic.  HENT:     Head: Normocephalic and atraumatic.     Nose: Nose normal. No congestion.     Mouth/Throat:     Mouth: Mucous membranes are moist.     Pharynx: No posterior oropharyngeal erythema.  Eyes:     General: No scleral icterus.    Extraocular Movements: Extraocular movements intact.  Cardiovascular:     Rate and Rhythm: Normal rate and regular rhythm.     Pulses: Normal pulses.     Heart sounds: Normal  heart sounds. No murmur heard. Pulmonary:     Breath sounds: Normal breath sounds. No wheezing or rales.  Musculoskeletal:     Cervical back: Neck supple. No tenderness.     Right lower leg: Edema (1+) present.     Left lower leg: Edema (1+) present.  Skin:    General: Skin is warm.     Findings: No rash.  Neurological:  General: No focal deficit present.     Mental Status: She is alert and oriented to person, place, and time.     Sensory: No sensory deficit.     Motor: No weakness.  Psychiatric:        Mood and Affect: Mood normal.        Behavior: Behavior normal.    BP (!) 145/67 (BP Location: Left Arm, Patient Position: Sitting, Cuff Size: Normal)   Pulse 61   Resp 18   Ht 5' (1.524 m)   Wt 97 lb 1.9 oz (44.1 kg)   SpO2 98%   BMI 18.97 kg/m  Wt Readings from Last 3 Encounters:  10/05/20 97 lb 1.9 oz (44.1 kg)  09/04/20 90 lb 14.4 oz (41.2 kg)  07/05/20 98 lb (44.5 kg)     Health Maintenance Due  Topic Date Due   HEMOGLOBIN A1C  Never done   DEXA SCAN  Never done   COVID-19 Vaccine (3 - Booster for Pfizer series) 09/14/2019    There are no preventive care reminders to display for this patient.  Lab Results  Component Value Date   TSH 3.880 07/19/2020   Lab Results  Component Value Date   WBC 4.8 08/28/2020   HGB 13.6 08/28/2020   HCT 42.7 08/28/2020   MCV 103.4 (H) 08/28/2020   PLT 149 (L) 08/28/2020   Lab Results  Component Value Date   NA 140 08/28/2020   K 3.4 (L) 08/28/2020   CO2 28 08/28/2020   GLUCOSE 139 (H) 08/28/2020   BUN 15 08/28/2020   CREATININE 1.04 (H) 08/28/2020   BILITOT 1.0 08/28/2020   ALKPHOS 102 08/28/2020   AST 22 08/28/2020   ALT 10 08/28/2020   PROT 8.1 08/28/2020   ALBUMIN 4.5 08/28/2020   CALCIUM 9.0 08/28/2020   ANIONGAP 7 08/28/2020   EGFR 50 (L) 07/19/2020   No results found for: CHOL No results found for: HDL No results found for: LDLCALC No results found for: TRIG No results found for: CHOLHDL No  results found for: HGBA1C    Assessment & Plan:   Problem List Items Addressed This Visit       Cardiovascular and Mediastinum   Essential hypertension - Primary    BP Readings from Last 1 Encounters:  10/05/20 (!) 145/67  Well-controlled with Felodipine Due to new onset ankle edema, had decreased dose of Felodipine to 5 mg QD Had started Valsartan 40 mg QD As BP mildly elevated today, will avoid discontinuing Felodipine for now Plan to DC Felopidine later and increase Valsartan accordingly if needed Counseled for compliance with the medications Advised DASH diet and moderate exercise/walking as tolerated        Endocrine   Hypothyroidism    Lab Results  Component Value Date   TSH 3.880 07/19/2020  On Levothyroxine 25 mcg QD Check TSH in the next visit      Relevant Medications   levothyroxine (SYNTHROID) 25 MCG tablet     Other   HLD (hyperlipidemia)    On Simvastatin      Other Visit Diagnoses     Postural dizziness with presyncope     History not consistent with any significant medical condition Could be related to episode of microsleep and/or dehydration Advised to maintain adequate hydration and small, frequent meals Advised to contact if she has another episode       Meds ordered this encounter  Medications   levothyroxine (SYNTHROID) 25 MCG tablet    Sig: Take  1 tablet (25 mcg total) by mouth daily before breakfast.    Dispense:  30 tablet    Refill:  3    Follow-up: Return in about 4 months (around 02/04/2021) for Annual physical.    [name], MD

## 2020-10-05 NOTE — Patient Instructions (Addendum)
Please continue taking medications as prescribed.  Please stay hydrated by taking at least 60 ounces of fluid in a day.  Please continue to ambulate as tolerated.

## 2020-10-05 NOTE — Assessment & Plan Note (Signed)
Lab Results  Component Value Date   TSH 3.880 07/19/2020   On Levothyroxine 25 mcg QD Check TSH in the next visit

## 2020-10-05 NOTE — Assessment & Plan Note (Signed)
BP Readings from Last 1 Encounters:  10/05/20 (!) 145/67   Well-controlled with Felodipine Due to new onset ankle edema, had decreased dose of Felodipine to 5 mg QD Had started Valsartan 40 mg QD As BP mildly elevated today, will avoid discontinuing Felodipine for now Plan to DC Felopidine later and increase Valsartan accordingly if needed Counseled for compliance with the medications Advised DASH diet and moderate exercise/walking as tolerated

## 2020-10-15 ENCOUNTER — Other Ambulatory Visit: Payer: Self-pay | Admitting: Internal Medicine

## 2020-10-15 DIAGNOSIS — I10 Essential (primary) hypertension: Secondary | ICD-10-CM

## 2020-10-30 ENCOUNTER — Inpatient Hospital Stay (HOSPITAL_COMMUNITY): Payer: PPO | Admitting: Dietician

## 2020-10-30 ENCOUNTER — Inpatient Hospital Stay (HOSPITAL_COMMUNITY): Payer: PPO | Attending: Oncology

## 2020-10-30 DIAGNOSIS — E538 Deficiency of other specified B group vitamins: Secondary | ICD-10-CM | POA: Insufficient documentation

## 2020-10-30 DIAGNOSIS — D509 Iron deficiency anemia, unspecified: Secondary | ICD-10-CM | POA: Insufficient documentation

## 2020-10-30 NOTE — Progress Notes (Signed)
Nutrition  Patient did not show for injection appointment. Unable to complete nutrition follow-up today.

## 2020-11-02 ENCOUNTER — Inpatient Hospital Stay (HOSPITAL_COMMUNITY): Payer: PPO

## 2020-11-02 ENCOUNTER — Other Ambulatory Visit: Payer: Self-pay

## 2020-11-02 VITALS — BP 156/64 | HR 86 | Temp 98.0°F | Resp 16

## 2020-11-02 DIAGNOSIS — D510 Vitamin B12 deficiency anemia due to intrinsic factor deficiency: Secondary | ICD-10-CM

## 2020-11-02 DIAGNOSIS — D509 Iron deficiency anemia, unspecified: Secondary | ICD-10-CM | POA: Diagnosis not present

## 2020-11-02 DIAGNOSIS — E538 Deficiency of other specified B group vitamins: Secondary | ICD-10-CM | POA: Diagnosis not present

## 2020-11-02 MED ORDER — CYANOCOBALAMIN 1000 MCG/ML IJ SOLN
1000.0000 ug | Freq: Once | INTRAMUSCULAR | Status: AC
  Administered 2020-11-02: 1000 ug via INTRAMUSCULAR
  Filled 2020-11-02: qty 1

## 2020-11-02 NOTE — Patient Instructions (Signed)
[name]  Discharge Instructions: Thank you for choosing [facility] to provide your oncology and hematology care.  If you have a lab appointment with the Cancer Center, please come in thru the Main Entrance and check in at the main information desk.  Wear comfortable clothing and clothing appropriate for easy access to any Portacath or PICC line.   We strive to give you quality time with your provider. You may need to reschedule your appointment if you arrive late (15 or more minutes).  Arriving late affects you and other patients whose appointments are after yours.  Also, if you miss three or more appointments without notifying the office, you may be dismissed from the clinic at the provider's discretion.      For prescription refill requests, have your pharmacy contact our office and allow 72 hours for refills to be completed.    Today you received the following chemotherapy and/or immunotherapy agents B12      To help prevent nausea and vomiting after your treatment, we encourage you to take your nausea medication as directed.  BELOW ARE SYMPTOMS THAT SHOULD BE REPORTED IMMEDIATELY: *FEVER GREATER THAN 100.4 F (38 C) OR HIGHER *CHILLS OR SWEATING *NAUSEA AND VOMITING THAT IS NOT CONTROLLED WITH YOUR NAUSEA MEDICATION *UNUSUAL SHORTNESS OF BREATH *UNUSUAL BRUISING OR BLEEDING *URINARY PROBLEMS (pain or burning when urinating, or frequent urination) *BOWEL PROBLEMS (unusual diarrhea, constipation, pain near the anus) TENDERNESS IN MOUTH AND THROAT WITH OR WITHOUT PRESENCE OF ULCERS (sore throat, sores in mouth, or a toothache) UNUSUAL RASH, SWELLING OR PAIN  UNUSUAL VAGINAL DISCHARGE OR ITCHING   Items with * indicate a potential emergency and should be followed up as soon as possible or go to the Emergency Department if any problems should occur.  Please show the CHEMOTHERAPY ALERT CARD or IMMUNOTHERAPY ALERT CARD at check-in to the Emergency Department  and triage nurse.  Should you have questions after your visit or need to cancel or reschedule your appointment, please contact [name] [phone]  and follow the prompts.  Office hours are 8:00 a.m. to 4:30 p.m. Monday - Friday. Please note that voicemails left after 4:00 p.m. may not be returned until the following business day.  We are closed weekends and major holidays. You have access to a nurse at all times for urgent questions. Please call the main number to the clinic [phone] and follow the prompts.  For any non-urgent questions, you may also contact your provider using MyChart. We now offer e-Visits for anyone 18 and older to request care online for non-urgent symptoms. For details visit [email or web address].   Also download the MyChart app! Go to the app store, search "MyChart", open the app, select [facility], and log in with your MyChart username and password.  Due to Covid, a mask is required upon entering the hospital/clinic. If you do not have a mask, one will be given to you upon arrival. For doctor visits, patients may have 1 support person aged 18 or older with them. For treatment visits, patients cannot have anyone with them due to current Covid guidelines and our immunocompromised population.  

## 2020-11-02 NOTE — Progress Notes (Signed)
[name] presents today for B12 injection per the provider's orders.  Stable during administration without incident; injection site WNL; see MAR for injection details.  Patient tolerated procedure well and without incident.  No questions or complaints noted at this time.  Discharge from clinic ambulatory in stable condition.  Alert and oriented X 3.  Follow up with [facility] as scheduled.

## 2020-11-30 ENCOUNTER — Encounter (HOSPITAL_COMMUNITY): Payer: Self-pay

## 2020-11-30 ENCOUNTER — Other Ambulatory Visit: Payer: Self-pay

## 2020-11-30 ENCOUNTER — Inpatient Hospital Stay (HOSPITAL_COMMUNITY): Payer: PPO | Attending: Oncology

## 2020-11-30 VITALS — BP 165/80 | HR 82 | Temp 97.9°F | Resp 18

## 2020-11-30 DIAGNOSIS — E538 Deficiency of other specified B group vitamins: Secondary | ICD-10-CM | POA: Insufficient documentation

## 2020-11-30 DIAGNOSIS — D509 Iron deficiency anemia, unspecified: Secondary | ICD-10-CM

## 2020-11-30 DIAGNOSIS — D510 Vitamin B12 deficiency anemia due to intrinsic factor deficiency: Secondary | ICD-10-CM

## 2020-11-30 MED ORDER — CYANOCOBALAMIN 1000 MCG/ML IJ SOLN
1000.0000 ug | Freq: Once | INTRAMUSCULAR | Status: AC
  Administered 2020-11-30: 1000 ug via INTRAMUSCULAR
  Filled 2020-11-30: qty 1

## 2020-11-30 NOTE — Patient Instructions (Signed)
[facility]  Discharge Instructions: Thank you for choosing [facility] to provide your oncology and hematology care.  If you have a lab appointment with the [facility], please come in thru the Main Entrance and check in at the main information desk.  Wear comfortable clothing and clothing appropriate for easy access to any Portacath or PICC line.   We strive to give you quality time with your provider. You may need to reschedule your appointment if you arrive late (15 or more minutes).  Arriving late affects you and other patients whose appointments are after yours.  Also, if you miss three or more appointments without notifying the office, you may be dismissed from the clinic at the provider's discretion.      For prescription refill requests, have your pharmacy contact our office and allow 72 hours for refills to be completed.    Today you received the following B12 injection, return as scheduled.   To help prevent nausea and vomiting after your treatment, we encourage you to take your nausea medication as directed.  BELOW ARE SYMPTOMS THAT SHOULD BE REPORTED IMMEDIATELY: *FEVER GREATER THAN 100.4 F (38 C) OR HIGHER *CHILLS OR SWEATING *NAUSEA AND VOMITING THAT IS NOT CONTROLLED WITH YOUR NAUSEA MEDICATION *UNUSUAL SHORTNESS OF BREATH *UNUSUAL BRUISING OR BLEEDING *URINARY PROBLEMS (pain or burning when urinating, or frequent urination) *BOWEL PROBLEMS (unusual diarrhea, constipation, pain near the anus) TENDERNESS IN MOUTH AND THROAT WITH OR WITHOUT PRESENCE OF ULCERS (sore throat, sores in mouth, or a toothache) UNUSUAL RASH, SWELLING OR PAIN  UNUSUAL VAGINAL DISCHARGE OR ITCHING   Items with * indicate a potential emergency and should be followed up as soon as possible or go to the Emergency Department if any problems should occur.  Please show the CHEMOTHERAPY ALERT CARD or IMMUNOTHERAPY ALERT CARD at check-in to the Emergency Department and triage  nurse.  Should you have questions after your visit or need to cancel or reschedule your appointment, please contact [facility] [phone]  and follow the prompts.  Office hours are 8:00 a.m. to 4:30 p.m. Monday - Friday. Please note that voicemails left after 4:00 p.m. may not be returned until the following business day.  We are closed weekends and major holidays. You have access to a nurse at all times for urgent questions. Please call the main number to the clinic [phone] and follow the prompts.  For any non-urgent questions, you may also contact your provider using MyChart. We now offer e-Visits for anyone 74 and older to request care online for non-urgent symptoms. For details visit [email or web address].   Also download the MyChart app! Go to the app store, search "MyChart", open the app, select [facility], and log in with your MyChart username and password.  Due to Covid, a mask is required upon entering the hospital/clinic. If you do not have a mask, one will be given to you upon arrival. For doctor visits, patients may have 1 support person aged 28 or older with them. For treatment visits, patients cannot have anyone with them due to current Covid guidelines and our immunocompromised population.

## 2020-11-30 NOTE — Progress Notes (Signed)
Patient tolerated B12 injection with no complaints voiced. Site clean and dry with no bruising or swelling noted at site. See MAR for details. Band aid applied.  Patient stable during and after injection. VSS with discharge and left in satisfactory condition with no s/s of distress noted. 

## 2020-12-11 DIAGNOSIS — B351 Tinea unguium: Secondary | ICD-10-CM | POA: Diagnosis not present

## 2020-12-11 DIAGNOSIS — I739 Peripheral vascular disease, unspecified: Secondary | ICD-10-CM | POA: Diagnosis not present

## 2020-12-25 ENCOUNTER — Other Ambulatory Visit: Payer: Self-pay | Admitting: *Deleted

## 2020-12-25 MED ORDER — SIMVASTATIN 40 MG PO TABS: 40.0000 mg | ORAL_TABLET | Freq: Every day | ORAL | 1 refills | Status: DC

## 2021-01-01 ENCOUNTER — Inpatient Hospital Stay (HOSPITAL_COMMUNITY): Payer: PPO | Attending: Oncology

## 2021-01-01 ENCOUNTER — Other Ambulatory Visit: Payer: Self-pay

## 2021-01-01 VITALS — BP 153/69 | HR 91 | Temp 98.6°F | Resp 18

## 2021-01-01 DIAGNOSIS — D510 Vitamin B12 deficiency anemia due to intrinsic factor deficiency: Secondary | ICD-10-CM

## 2021-01-01 DIAGNOSIS — D509 Iron deficiency anemia, unspecified: Secondary | ICD-10-CM

## 2021-01-01 DIAGNOSIS — E538 Deficiency of other specified B group vitamins: Secondary | ICD-10-CM | POA: Diagnosis not present

## 2021-01-01 MED ORDER — CYANOCOBALAMIN 1000 MCG/ML IJ SOLN
1000.0000 ug | Freq: Once | INTRAMUSCULAR | Status: AC
  Administered 2021-01-01: 1000 ug via INTRAMUSCULAR
  Filled 2021-01-01: qty 1

## 2021-01-01 NOTE — Progress Notes (Signed)
Patient presents today for Vitamin B12 injection.  Patient is in satisfactory condition with no complaints voiced.  Vital signs are stable.  We will proceed with treatment per MD orders.   Patient tolerated injection with no complaints voiced.  Site clean and dry with no bruising or swelling noted.  No complaints of pain.  Discharged with vital signs stable and no signs or symptoms of distress noted.

## 2021-01-01 NOTE — Patient Instructions (Signed)
[name]  Discharge Instructions: Thank you for choosing [facility] to provide your oncology and hematology care.  If you have a lab appointment with the Cancer Center, please come in thru the Main Entrance and check in at the main information desk.  Wear comfortable clothing and clothing appropriate for easy access to any Portacath or PICC line.   We strive to give you quality time with your provider. You may need to reschedule your appointment if you arrive late (15 or more minutes).  Arriving late affects you and other patients whose appointments are after yours.  Also, if you miss three or more appointments without notifying the office, you may be dismissed from the clinic at the provider's discretion.      For prescription refill requests, have your pharmacy contact our office and allow 72 hours for refills to be completed.        To help prevent nausea and vomiting after your treatment, we encourage you to take your nausea medication as directed.  BELOW ARE SYMPTOMS THAT SHOULD BE REPORTED IMMEDIATELY: *FEVER GREATER THAN 100.4 F (38 C) OR HIGHER *CHILLS OR SWEATING *NAUSEA AND VOMITING THAT IS NOT CONTROLLED WITH YOUR NAUSEA MEDICATION *UNUSUAL SHORTNESS OF BREATH *UNUSUAL BRUISING OR BLEEDING *URINARY PROBLEMS (pain or burning when urinating, or frequent urination) *BOWEL PROBLEMS (unusual diarrhea, constipation, pain near the anus) TENDERNESS IN MOUTH AND THROAT WITH OR WITHOUT PRESENCE OF ULCERS (sore throat, sores in mouth, or a toothache) UNUSUAL RASH, SWELLING OR PAIN  UNUSUAL VAGINAL DISCHARGE OR ITCHING   Items with * indicate a potential emergency and should be followed up as soon as possible or go to the Emergency Department if any problems should occur.  Please show the CHEMOTHERAPY ALERT CARD or IMMUNOTHERAPY ALERT CARD at check-in to the Emergency Department and triage nurse.  Should you have questions after your visit or need to cancel  or reschedule your appointment, please contact [name] [phone]  and follow the prompts.  Office hours are 8:00 a.m. to 4:30 p.m. Monday - Friday. Please note that voicemails left after 4:00 p.m. may not be returned until the following business day.  We are closed weekends and major holidays. You have access to a nurse at all times for urgent questions. Please call the main number to the clinic [phone] and follow the prompts.  For any non-urgent questions, you may also contact your provider using MyChart. We now offer e-Visits for anyone 85 and older to request care online for non-urgent symptoms. For details visit [email or web address].   Also download the MyChart app! Go to the app store, search "MyChart", open the app, select [facility], and log in with your MyChart username and password.  Due to Covid, a mask is required upon entering the hospital/clinic. If you do not have a mask, one will be given to you upon arrival. For doctor visits, patients may have 1 support person aged 85 or older with them. For treatment visits, patients cannot have anyone with them due to current Covid guidelines and our immunocompromised population.  

## 2021-01-02 ENCOUNTER — Other Ambulatory Visit: Payer: Self-pay | Admitting: *Deleted

## 2021-01-02 DIAGNOSIS — E039 Hypothyroidism, unspecified: Secondary | ICD-10-CM

## 2021-01-02 MED ORDER — LEVOTHYROXINE SODIUM 25 MCG PO TABS: 25.0000 ug | ORAL_TABLET | Freq: Every day | ORAL | 3 refills | Status: DC

## 2021-01-30 ENCOUNTER — Encounter (HOSPITAL_COMMUNITY): Payer: Self-pay | Admitting: Hematology

## 2021-01-31 ENCOUNTER — Encounter (HOSPITAL_COMMUNITY): Payer: Self-pay | Admitting: Hematology

## 2021-02-01 ENCOUNTER — Other Ambulatory Visit: Payer: Self-pay

## 2021-02-01 ENCOUNTER — Inpatient Hospital Stay (HOSPITAL_COMMUNITY): Payer: PPO | Attending: Oncology

## 2021-02-01 VITALS — BP 169/74 | HR 76 | Temp 97.8°F | Resp 18

## 2021-02-01 DIAGNOSIS — E538 Deficiency of other specified B group vitamins: Secondary | ICD-10-CM | POA: Insufficient documentation

## 2021-02-01 DIAGNOSIS — D509 Iron deficiency anemia, unspecified: Secondary | ICD-10-CM

## 2021-02-01 DIAGNOSIS — D510 Vitamin B12 deficiency anemia due to intrinsic factor deficiency: Secondary | ICD-10-CM

## 2021-02-01 MED ORDER — CYANOCOBALAMIN 1000 MCG/ML IJ SOLN
1000.0000 ug | Freq: Once | INTRAMUSCULAR | Status: AC
  Administered 2021-02-01: 1000 ug via INTRAMUSCULAR
  Filled 2021-02-01: qty 1

## 2021-02-01 NOTE — Progress Notes (Signed)
[name] presents today for B12 injection per the provider's orders.  Stable during administration without incident; injection site WNL; see MAR for injection details.  Patient tolerated procedure well and without incident.  No questions or complaints noted at this time. Discharge from clinic ambulatory in stable condition.  Alert and oriented X 3.  Follow up with [facility] as scheduled.

## 2021-02-01 NOTE — Patient Instructions (Signed)
[name]  Discharge Instructions: Thank you for choosing [facility] to provide your oncology and hematology care.  If you have a lab appointment with the Cancer Center, please come in thru the Main Entrance and check in at the main information desk.  Wear comfortable clothing and clothing appropriate for easy access to any Portacath or PICC line.   We strive to give you quality time with your provider. You may need to reschedule your appointment if you arrive late (15 or more minutes).  Arriving late affects you and other patients whose appointments are after yours.  Also, if you miss three or more appointments without notifying the office, you may be dismissed from the clinic at the provider's discretion.      For prescription refill requests, have your pharmacy contact our office and allow 72 hours for refills to be completed.    Today you received the following chemotherapy and/or immunotherapy agents B12      To help prevent nausea and vomiting after your treatment, we encourage you to take your nausea medication as directed.  BELOW ARE SYMPTOMS THAT SHOULD BE REPORTED IMMEDIATELY: *FEVER GREATER THAN 100.4 F (38 C) OR HIGHER *CHILLS OR SWEATING *NAUSEA AND VOMITING THAT IS NOT CONTROLLED WITH YOUR NAUSEA MEDICATION *UNUSUAL SHORTNESS OF BREATH *UNUSUAL BRUISING OR BLEEDING *URINARY PROBLEMS (pain or burning when urinating, or frequent urination) *BOWEL PROBLEMS (unusual diarrhea, constipation, pain near the anus) TENDERNESS IN MOUTH AND THROAT WITH OR WITHOUT PRESENCE OF ULCERS (sore throat, sores in mouth, or a toothache) UNUSUAL RASH, SWELLING OR PAIN  UNUSUAL VAGINAL DISCHARGE OR ITCHING   Items with * indicate a potential emergency and should be followed up as soon as possible or go to the Emergency Department if any problems should occur.  Please show the CHEMOTHERAPY ALERT CARD or IMMUNOTHERAPY ALERT CARD at check-in to the Emergency Department  and triage nurse.  Should you have questions after your visit or need to cancel or reschedule your appointment, please contact [facility] [phone]  and follow the prompts.  Office hours are 8:00 a.m. to 4:30 p.m. Monday - Friday. Please note that voicemails left after 4:00 p.m. may not be returned until the following business day.  We are closed weekends and major holidays. You have access to a nurse at all times for urgent questions. Please call the main number to the clinic [phone] and follow the prompts.  For any non-urgent questions, you may also contact your provider using MyChart. We now offer e-Visits for anyone 18 and older to request care online for non-urgent symptoms. For details visit [email or web address].   Also download the MyChart app! Go to the app store, search "MyChart", open the app, select [facility], and log in with your MyChart username and password.  Due to Covid, a mask is required upon entering the hospital/clinic. If you do not have a mask, one will be given to you upon arrival. For doctor visits, patients may have 1 support person aged 18 or older with them. For treatment visits, patients cannot have anyone with them due to current Covid guidelines and our immunocompromised population.  

## 2021-02-05 ENCOUNTER — Encounter: Payer: Self-pay | Admitting: Internal Medicine

## 2021-02-05 ENCOUNTER — Other Ambulatory Visit: Payer: Self-pay

## 2021-02-05 ENCOUNTER — Ambulatory Visit (INDEPENDENT_AMBULATORY_CARE_PROVIDER_SITE_OTHER): Payer: PPO | Admitting: Internal Medicine

## 2021-02-05 VITALS — BP 136/64 | HR 84 | Resp 16 | Ht 60.0 in | Wt 98.1 lb

## 2021-02-05 DIAGNOSIS — E782 Mixed hyperlipidemia: Secondary | ICD-10-CM | POA: Diagnosis not present

## 2021-02-05 DIAGNOSIS — Z23 Encounter for immunization: Secondary | ICD-10-CM

## 2021-02-05 DIAGNOSIS — G301 Alzheimer's disease with late onset: Secondary | ICD-10-CM | POA: Diagnosis not present

## 2021-02-05 DIAGNOSIS — N1831 Chronic kidney disease, stage 3a: Secondary | ICD-10-CM | POA: Diagnosis not present

## 2021-02-05 DIAGNOSIS — F0280 Dementia in other diseases classified elsewhere without behavioral disturbance: Secondary | ICD-10-CM

## 2021-02-05 DIAGNOSIS — I10 Essential (primary) hypertension: Secondary | ICD-10-CM

## 2021-02-05 DIAGNOSIS — E039 Hypothyroidism, unspecified: Secondary | ICD-10-CM

## 2021-02-05 DIAGNOSIS — Z0001 Encounter for general adult medical examination with abnormal findings: Secondary | ICD-10-CM | POA: Diagnosis not present

## 2021-02-05 MED ORDER — FELODIPINE ER 5 MG PO TB24: 5.0000 mg | ORAL_TABLET | Freq: Every day | ORAL | 1 refills | Status: DC

## 2021-02-05 MED ORDER — SIMVASTATIN 40 MG PO TABS: 40.0000 mg | ORAL_TABLET | Freq: Every day | ORAL | 3 refills | Status: DC

## 2021-02-05 MED ORDER — VALSARTAN 40 MG PO TABS: 40.0000 mg | ORAL_TABLET | Freq: Every day | ORAL | 1 refills | Status: DC

## 2021-02-05 NOTE — Assessment & Plan Note (Addendum)
On statin.

## 2021-02-05 NOTE — Assessment & Plan Note (Signed)
Physical exam as documented. Fasting labs at cancer screening. PCV20 in the office today. Advised to get Shingrix at local pharmacy.

## 2021-02-05 NOTE — Assessment & Plan Note (Signed)
Lab Results  Component Value Date   TSH 3.880 07/19/2020   On Levothyroxine 25 mcg QD Check TSH and free T4

## 2021-02-05 NOTE — Assessment & Plan Note (Signed)
On Donepezil 5 mg QD

## 2021-02-05 NOTE — Addendum Note (Signed)
Addended by: Zacarias Pontes R on: 02/05/2021 11:31 AM   Modules accepted: Orders

## 2021-02-05 NOTE — Assessment & Plan Note (Signed)
BP Readings from Last 1 Encounters:  02/05/21 136/64   Usually well-controlled with Felodipine and Valsartan Plan to DC Felopidine later and increase Valsartan accordingly if needed Counseled for compliance with the medications Advised DASH diet and moderate exercise/walking as tolerated

## 2021-02-05 NOTE — Patient Instructions (Signed)
Please continue taking medications as prescribed.  Please get blood tests done when you go to get other blood tests at cancer screening.  Please continue to ambulate as tolerated.

## 2021-02-05 NOTE — Assessment & Plan Note (Signed)
Check CMP Avoid nephrotoxic agents On ARB

## 2021-02-05 NOTE — Progress Notes (Signed)
Established Patient Office Visit  Subjective:  Patient ID: [name], female    DOB: [birth year]  Age: 88 y.o. MRN: [id number]  CC:  Chief Complaint  Patient presents with   Annual Exam    Annual exam     HPI [name] is a 88 y.o. female with past medical history of HTN, benign neuroendocrine carcinoid tumor of stomach, iron deficiency anemia, vitamin B12 deficiency and malnutrition who presents for annual physical. Her son is present with her during the visit.  HTN: Her BP has been elevated at times, but her medication review suggests that she has run out of her Felodipine. She has not been taking Valsartan as well because label on the bottle is not readable to her (very light printed). She denies any headache, dizziness, chest pain, dyspnea or palpitations.  Hypothyroidism: Has been taking Levothyroxine 25 mcg QD. Denies any palpitations, recent appetite or weight change or recent skin changes.   She has been taking Donepezil for mild cognitive decline. Son denies any behavioral disturbance.  She received PCV20 in the office today.  Past Medical History:  Diagnosis Date   Adult BMI 19-24 kg/sq m 2008 125 lbs   Anemia    Pernicious 2008-HB 12 MCV 119.5; JAN 2012 HB 13.2 MCV 97.6   Carcinoid tumor of stomach 2008   Diverticulosis    GERD (gastroesophageal reflux disease)    Hiatal hernia    HTN (hypertension)    Hyperlipidemia    Hypertension    Phreesia 02/07/2020   Mild memory loss 02/15/2011   Near syncope 04/18/2014   Pernicious anemia    Pernicious anemia 06/14/2010   Wrist fracture, left 2008    Past Surgical History:  Procedure Laterality Date   COLONOSCOPY  2003 LS   DC/ Diverticulosis   EUS  2008 DJ   FNA NO CARCINOID   EUS  JAN 2009   NL EXAM, NO Bx   E[facility] WO   CARCINOID   UPPER GASTROINTESTINAL ENDOSCOPY  APR 2008 [facility]   CARCIONOID   UPPER GASTROINTESTINAL ENDOSCOPY  FEB 2011 [facility]   CARCINOID/mild anemia/large hiatal hernia     Family History  Problem Relation Age of Onset   Cancer Mother    Heart disease Brother    Colon cancer Neg Hx    Colon polyps Neg Hx     Social History   Socioeconomic History   Marital status: Widowed    Spouse name: Not on file   Number of children: 1   Years of education: Not on file   Highest education level: Not on file  Occupational History   Not on file  Tobacco Use   Smoking status: Never   Smokeless tobacco: Never  Vaping Use   Vaping Use: Never used  Substance and Sexual Activity   Alcohol use: No   Drug use: No   Sexual activity: Not Currently    Birth control/protection: Post-menopausal  Other Topics Concern   Not on file  Social History Narrative   Not on file   Social Determinants of Health   Financial Resource Strain: Low Risk    Difficulty of Paying Living Expenses: Not hard at all  Food Insecurity: No Food Insecurity   Worried About Charity fundraiser in the Last Year: Never true   [place] in the Last Year: Never true  Transportation Needs: No Transportation Needs   Lack of Transportation (Medical): No   Lack of Transportation (  Non-Medical): No  Physical Activity: Inactive   Days of Exercise per Week: 0 days   Minutes of Exercise per Session: 0 min  Stress: No Stress Concern Present   Feeling of Stress : Not at all  Social Connections: Socially Isolated   Frequency of Communication with Friends and Family: More than three times a week   Frequency of Social Gatherings with Friends and Family: More than three times a week   Attends Religious Services: Never   Marine scientist or Organizations: No   Attends Archivist Meetings: Never   Marital Status: Widowed  Human resources officer Violence: Not At Risk   Fear of Current or Ex-Partner: No   Emotionally Abused: No   Physically Abused: No   Sexually Abused: No    Outpatient Medications Prior to Visit  Medication Sig Dispense Refill   acetaminophen (TYLENOL) 500 MG  tablet Take 500 mg by mouth every 6 (six) hours as needed. For pain     donepezil (ARICEPT) 5 MG tablet Take 1 tablet (5 mg total) by mouth at bedtime. 90 tablet 3   levothyroxine (SYNTHROID) 25 MCG tablet Take 1 tablet (25 mcg total) by mouth daily before breakfast. 30 tablet 3   felodipine (PLENDIL) 5 MG 24 hr tablet Take 2 tablets (10 mg total) by mouth daily. 90 tablet 0   simvastatin (ZOCOR) 40 MG tablet Take 1 tablet (40 mg total) by mouth daily. 30 tablet 1   valsartan (DIOVAN) 40 MG tablet Take 1 tablet by mouth once daily 90 tablet 0   No facility-administered medications prior to visit.    Allergies  Allergen Reactions   Codeine     ROS Review of Systems  Constitutional:  Negative for chills and fever.  HENT:  Negative for congestion, sinus pressure, sinus pain and sore throat.   Eyes:  Negative for pain and discharge.  Respiratory:  Negative for cough and shortness of breath.   Cardiovascular:  Negative for chest pain and palpitations.  Gastrointestinal:  Negative for abdominal pain, constipation, diarrhea, nausea and vomiting.  Endocrine: Negative for polydipsia and polyuria.  Genitourinary:  Negative for dysuria and hematuria.  Musculoskeletal:  Negative for neck pain and neck stiffness.  Skin:  Negative for rash.  Neurological:  Negative for dizziness, weakness and headaches.  Psychiatric/Behavioral:  Negative for agitation and behavioral problems.      Objective:    Physical Exam Vitals reviewed.  Constitutional:      General: She is not in acute distress.    Appearance: She is not diaphoretic.  HENT:     Head: Normocephalic and atraumatic.     Nose: Nose normal. No congestion.     Mouth/Throat:     Mouth: Mucous membranes are moist.     Pharynx: No posterior oropharyngeal erythema.  Eyes:     General: No scleral icterus.    Extraocular Movements: Extraocular movements intact.  Cardiovascular:     Rate and Rhythm: Normal rate and regular rhythm.      Pulses: Normal pulses.     Heart sounds: Normal heart sounds. No murmur heard. Pulmonary:     Breath sounds: Normal breath sounds. No wheezing or rales.  Abdominal:     Palpations: Abdomen is soft.     Tenderness: There is no abdominal tenderness.  Musculoskeletal:     Cervical back: Neck supple. No tenderness.     Right lower leg: Edema (1+) present.     Left lower leg: Edema (1+) present.  Skin:    General: Skin is warm.     Findings: No rash.  Neurological:     General: No focal deficit present.     Mental Status: She is alert and oriented to person, place, and time.     Cranial Nerves: No cranial nerve deficit.     Sensory: No sensory deficit.     Motor: No weakness.  Psychiatric:        Mood and Affect: Mood normal.        Behavior: Behavior normal.    BP 136/64 (BP Location: Left Arm, Cuff Size: Normal)    Pulse 84    Resp 16    Ht 5' (1.524 m)    Wt 98 lb 1.3 oz (44.5 kg)    SpO2 99%    BMI 19.15 kg/m  Wt Readings from Last 3 Encounters:  02/05/21 98 lb 1.3 oz (44.5 kg)  10/05/20 97 lb 1.9 oz (44.1 kg)  09/04/20 90 lb 14.4 oz (41.2 kg)    Lab Results  Component Value Date   TSH 3.880 07/19/2020   Lab Results  Component Value Date   WBC 4.8 08/28/2020   HGB 13.6 08/28/2020   HCT 42.7 08/28/2020   MCV 103.4 (H) 08/28/2020   PLT 149 (L) 08/28/2020   Lab Results  Component Value Date   NA 140 08/28/2020   K 3.4 (L) 08/28/2020   CO2 28 08/28/2020   GLUCOSE 139 (H) 08/28/2020   BUN 15 08/28/2020   CREATININE 1.04 (H) 08/28/2020   BILITOT 1.0 08/28/2020   ALKPHOS 102 08/28/2020   AST 22 08/28/2020   ALT 10 08/28/2020   PROT 8.1 08/28/2020   ALBUMIN 4.5 08/28/2020   CALCIUM 9.0 08/28/2020   ANIONGAP 7 08/28/2020   EGFR 50 (L) 07/19/2020   No results found for: CHOL No results found for: HDL No results found for: LDLCALC No results found for: TRIG No results found for: CHOLHDL No results found for: HGBA1C    Assessment & Plan:   Problem List  Items Addressed This Visit       Encounter for general adult medical examination with abnormal findings - Primary   Physical exam as documented. Fasting labs at cancer screening. PCV20 in the office today. Advised to get Shingrix at local pharmacy.       Cardiovascular and Mediastinum   Essential hypertension    BP Readings from Last 1 Encounters:  02/05/21 136/64  Usually well-controlled with Felodipine and Valsartan Plan to DC Felopidine later and increase Valsartan accordingly if needed Counseled for compliance with the medications Advised DASH diet and moderate exercise/walking as tolerated      Relevant Medications   felodipine (PLENDIL) 5 MG 24 hr tablet   valsartan (DIOVAN) 40 MG tablet   simvastatin (ZOCOR) 40 MG tablet     Endocrine   Hypothyroidism    Lab Results  Component Value Date   TSH 3.880 07/19/2020  On Levothyroxine 25 mcg QD Check TSH and free T4      Relevant Orders   TSH + free T4     Nervous and Auditory   Late onset Alzheimer's dementia without behavioral disturbance (HCC)    On Donepezil 5 mg QD        Genitourinary   Stage 3a chronic kidney disease (HCC)    Check CMP Avoid nephrotoxic agents On ARB        Other   HLD (hyperlipidemia)    On statin  Relevant Medications   felodipine (PLENDIL) 5 MG 24 hr tablet   valsartan (DIOVAN) 40 MG tablet   simvastatin (ZOCOR) 40 MG tablet          Meds ordered this encounter  Medications   felodipine (PLENDIL) 5 MG 24 hr tablet    Sig: Take 1 tablet (5 mg total) by mouth daily.    Dispense:  90 tablet    Refill:  1   valsartan (DIOVAN) 40 MG tablet    Sig: Take 1 tablet (40 mg total) by mouth daily.    Dispense:  90 tablet    Refill:  1   simvastatin (ZOCOR) 40 MG tablet    Sig: Take 1 tablet (40 mg total) by mouth daily.    Dispense:  90 tablet    Refill:  3    Follow-up: Return in about 4 months (around 06/05/2021) for HTN and hypothyroidism.    [name], MD

## 2021-02-19 DIAGNOSIS — I739 Peripheral vascular disease, unspecified: Secondary | ICD-10-CM | POA: Diagnosis not present

## 2021-02-19 DIAGNOSIS — B351 Tinea unguium: Secondary | ICD-10-CM | POA: Diagnosis not present

## 2021-02-22 ENCOUNTER — Ambulatory Visit (INDEPENDENT_AMBULATORY_CARE_PROVIDER_SITE_OTHER): Payer: PPO

## 2021-02-22 ENCOUNTER — Other Ambulatory Visit: Payer: Self-pay

## 2021-02-22 DIAGNOSIS — Z0000 Encounter for general adult medical examination without abnormal findings: Secondary | ICD-10-CM | POA: Diagnosis not present

## 2021-02-22 NOTE — Progress Notes (Signed)
Subjective:   [name] is a 88 y.o. female who presents for Medicare Annual (Subsequent) preventive examination. I connected with  [name] on 02/22/21 by a audio enabled telemedicine application and verified that I am speaking with the correct person using two identifiers.  Patient Location: Home  Provider Location: Office/Clinic  I discussed the limitations of evaluation and management by telemedicine. The patient expressed understanding and agreed to proceed.  Review of Systems    Defer to PCP Cardiac Risk Factors include: hypertension;advanced age (>80men, >21 women);sedentary lifestyle     Objective:    Today's Vitals   02/22/21 1502  PainSc: 0-No pain   There is no height or weight on file to calculate BMI.  Advanced Directives 02/22/2021 02/01/2021 01/01/2021 11/30/2020 11/02/2020 09/29/2020 09/04/2020  Does Patient Have a Medical Advance Directive? Yes Yes Yes Yes Yes Yes Yes  Type of Paramedic of [place];Living will [place];Living will [place];Living will [place];Living will - [place];Living will  Does patient want to make changes to medical advance directive? No - Patient declined - No - Patient declined No - Patient declined - No - Patient declined No - Patient declined  Copy of [place] in Chart? No - copy requested No - copy requested No - copy requested No - copy requested No - copy requested - No - copy requested  Would patient like information on creating a medical advance directive? No - Patient declined No - Patient declined No - Patient declined No - Patient declined No - Patient declined No - Patient declined No - Patient declined    Current Medications (verified) Outpatient Encounter Medications as of 02/22/2021  Medication Sig   acetaminophen (TYLENOL) 500 MG tablet Take 500 mg by mouth every 6  (six) hours as needed. For pain   donepezil (ARICEPT) 5 MG tablet Take 1 tablet (5 mg total) by mouth at bedtime.   felodipine (PLENDIL) 5 MG 24 hr tablet Take 1 tablet (5 mg total) by mouth daily.   levothyroxine (SYNTHROID) 25 MCG tablet Take 1 tablet (25 mcg total) by mouth daily before breakfast.   simvastatin (ZOCOR) 40 MG tablet Take 1 tablet (40 mg total) by mouth daily.   valsartan (DIOVAN) 40 MG tablet Take 1 tablet (40 mg total) by mouth daily.   No facility-administered encounter medications on file as of 02/22/2021.    Allergies (verified) Codeine   History: Past Medical History:  Diagnosis Date   Adult BMI 19-24 kg/sq m 2008 125 lbs   Anemia    Pernicious 2008-HB 12 MCV 119.5; JAN 2012 HB 13.2 MCV 97.6   Carcinoid tumor of stomach 2008   Diverticulosis    GERD (gastroesophageal reflux disease)    Hiatal hernia    HTN (hypertension)    Hyperlipidemia    Hypertension    Phreesia 02/07/2020   Mild memory loss 02/15/2011   Near syncope 04/18/2014   Pernicious anemia    Pernicious anemia 06/14/2010   Wrist fracture, left 2008   Past Surgical History:  Procedure Laterality Date   COLONOSCOPY  2003 LS   DC/[place] Diverticulosis   EUS  2008 DJ   FNA NO CARCINOID   EUS  JAN 2009   NL EXAM, NO Bx   EUS  [facility] 2011 WO   CARCINOID   UPPER GASTROINTESTINAL ENDOSCOPY  APR 2008 SLF   CARCIONOID   UPPER  GASTROINTESTINAL ENDOSCOPY  FEB 2011 [facility]   CARCINOID/mild anemia/large hiatal hernia   Family History  Problem Relation Age of Onset   Cancer Mother    Heart disease Brother    Kidney disease Son    Colon cancer Neg Hx    Colon polyps Neg Hx    Social History   Socioeconomic History   Marital status: Widowed    Spouse name: Not on file   Number of children: 1   Years of education: 83   Highest education level: Not on file  Occupational History   Not on file  Tobacco Use   Smoking status: Never   Smokeless tobacco: Never  Vaping Use   Vaping Use: Never used   Substance and Sexual Activity   Alcohol use: No   Drug use: No   Sexual activity: Not Currently    Birth control/protection: Post-menopausal  Other Topics Concern   Not on file  Social History Narrative   Live with son Ray   Social Determinants of Health   Financial Resource Strain: Low Risk    Difficulty of Paying Living Expenses: Not hard at all  Food Insecurity: No Food Insecurity   Worried About Charity fundraiser in the Last Year: Never true   [place] in the Last Year: Never true  Transportation Needs: No Transportation Needs   Lack of Transportation (Medical): No   Lack of Transportation (Non-Medical): No  Physical Activity: Inactive   Days of Exercise per Week: 0 days   Minutes of Exercise per Session: 0 min  Stress: No Stress Concern Present   Feeling of Stress : Not at all  Social Connections: Moderately Integrated   Frequency of Communication with Friends and Family: Three times a week   Frequency of Social Gatherings with Friends and Family: More than three times a week   Attends Religious Services: More than 4 times per year   Active Member of [employer] or Organizations: Yes   Attends Archivist Meetings: Never   Marital Status: Widowed    Tobacco Counseling Counseling given: Not Answered   Clinical Intake:  Pre-visit preparation completed: No  Pain : No/denies pain Pain Score: 0-No pain     Nutritional Risks: None Diabetes: No  How often do you need to have someone help you when you read instructions, pamphlets, or other written materials from your doctor or pharmacy?: 1 - Never What is the last grade level you completed in school?: 12  Diabetic?no  Interpreter Needed?: No  Comments: [place] of Daily Living In your present state of health, do you have any difficulty performing the following activities: 02/22/2021  Vision? N  Difficulty concentrating or making decisions? N  Walking or climbing stairs? N  Dressing  or bathing? N  Doing errands, shopping? N  Preparing Food and eating ? N  Using the Toilet? N  In the past six months, have you accidently leaked urine? N  Do you have problems with loss of bowel control? N  Managing your Medications? N  Managing your Finances? N  Housekeeping or managing your Housekeeping? N  Some recent data might be hidden    Patient Care Team: [name], MD as PCP - General (Internal Medicine) Danie Binder, MD (Inactive) (Gastroenterology)  Indicate any recent Medical Services you may have received from other than [facility] providers in the past year (date may be approximate).     Assessment:   This is a routine  wellness examination for [place].  Hearing/Vision screen No results found.  Dietary issues and exercise activities discussed: Current Exercise Habits: The patient does not participate in regular exercise at present, Exercise limited by: cardiac condition(s);psychological condition(s)   Goals Addressed   None   Depression Screen PHQ 2/9 Scores 02/22/2021 02/05/2021 10/05/2020 07/05/2020 04/06/2020 02/18/2020 02/10/2020  PHQ - 2 Score 0 0 0 0 0 0 0    Fall Risk Fall Risk  02/22/2021 02/05/2021 10/05/2020 07/05/2020 04/06/2020  Falls in the past year? 0 0 0 0 0  Comment - - - - -  Number falls in past yr: 0 0 0 0 0  Injury with Fall? 0 0 0 0 0  Risk for fall due to : No Fall Risks No Fall Risks No Fall Risks No Fall Risks No Fall Risks  Follow up Falls evaluation completed Falls evaluation completed Falls evaluation completed Falls evaluation completed Falls evaluation completed    FALL RISK PREVENTION PERTAINING TO THE HOME:  Any stairs in or around the home? Yes  If so, are there any without handrails? Yes  Home free of loose throw rugs in walkways, pet beds, electrical cords, etc? Yes  Adequate lighting in your home to reduce risk of falls? Yes   ASSISTIVE DEVICES UTILIZED TO PREVENT FALLS:  Life alert? No  Use of a cane, walker or w/c? No   Grab bars in the bathroom? Yes  Shower chair or bench in shower? Yes  Elevated toilet seat or a handicapped toilet? No    Cognitive Function:     6CIT Screen 02/22/2021 02/18/2020  What Year? 0 points 0 points  What month? 0 points 0 points  What time? 0 points 0 points  Count back from 20 0 points 0 points  Months in reverse 2 points 0 points  Repeat phrase 2 points 0 points  Total Score 4 0    Immunizations Immunization History  Administered Date(s) Administered   Influenza, High Dose Seasonal PF 11/12/2017   Influenza-Unspecified 10/23/2016, 10/21/2017, 01/13/2021   PFIZER(Purple Top)SARS-COV-2 Vaccination 03/18/2019, 04/14/2019   PNEUMOCOCCAL CONJUGATE-20 02/05/2021   Pfizer Covid-19 Vaccine Bivalent Booster 67yrs & up 01/20/2021   Zoster Recombinat (Shingrix) 02/19/2021    TDAP status: Due, Education has been provided regarding the importance of this vaccine. Advised may receive this vaccine at local pharmacy or Health Dept. Aware to provide a copy of the vaccination record if obtained from local pharmacy or Health Dept. Verbalized acceptance and understanding.  Flu Vaccine status: Up to date  Pneumococcal vaccine status: Up to date  Covid-19 vaccine status: Information provided on how to obtain vaccines.   Qualifies for Shingles Vaccine? Yes   Zostavax completed No   Shingrix Completed?: No.    Education has been provided regarding the importance of this vaccine. Patient has been advised to call insurance company to determine out of pocket expense if they have not yet received this vaccine. Advised may also receive vaccine at local pharmacy or Health Dept. Verbalized acceptance and understanding.  Screening Tests Health Maintenance  Topic Date Due   HEMOGLOBIN A1C  Never done   DEXA SCAN  Never done   TETANUS/TDAP  07/05/2021 (Originally 02/09/1941)   Zoster Vaccines- Shingrix (2 of 2) 04/16/2021   Pneumonia Vaccine 82+ Years old  Completed   COVID-19 Vaccine   Completed   HPV VACCINES  Aged Out   FOOT EXAM  Discontinued   OPHTHALMOLOGY EXAM  Discontinued    Health Maintenance  Health Maintenance Due  Topic Date Due   HEMOGLOBIN A1C  Never done   DEXA SCAN  Never done    Colorectal cancer screening: No longer required.   Mammogram status: No longer required due to age.  Bone Density status: Ordered Pt Refuses. Pt provided with contact info and advised to call to schedule appt.  Lung Cancer Screening: (Low Dose CT Chest recommended if Age 31-80 years, 30 pack-year currently smoking OR have quit w/in 15years.) does not qualify.   Lung Cancer Screening Referral: n/  Additional Screening:  Hepatitis C Screening: does not qualify; Completed not at high risk  Vision Screening: Recommended annual ophthalmology exams for early detection of glaucoma and other disorders of the eye. Is the patient up to date with their annual eye exam?  No  Who is the provider or what is the name of the office in which the patient attends annual eye exams? PT does not need an eye exam per pt If pt is not established with a provider, would they like to be referred to a provider to establish care? No .   Dental Screening: Recommended annual dental exams for proper oral hygiene  Community Resource Referral / Chronic Care Management: CRR required this visit?  No   CCM required this visit?  No      Plan:     I have personally reviewed and noted the following in the patients chart:   Medical and social history Use of alcohol, tobacco or illicit drugs  Current medications and supplements including opioid prescriptions.  Functional ability and status Nutritional status Physical activity Advanced directives List of other physicians Hospitalizations, surgeries, and ER visits in previous 12 months Vitals Screenings to include cognitive, depression, and falls Referrals and appointments  In addition, I have reviewed and discussed with patient certain  preventive protocols, quality metrics, and best practice recommendations. A written personalized care plan for preventive services as well as general preventive health recommendations were provided to patient.     Earline May[place], [place]   02/22/2021   Nurse Notes:  Ms. Greenstein , Thank you for taking time to come for your Medicare Wellness Visit. I appreciate your ongoing commitment to your health goals. Please review the following plan we discussed and let me know if I can assist you in the future.   These are the goals we discussed:  Goals   None     This is a list of the screening recommended for you and due dates:  Health Maintenance  Topic Date Due   Hemoglobin A1C  Never done   DEXA scan (bone density measurement)  Never done   Tetanus Vaccine  07/05/2021*   Zoster (Shingles) Vaccine (2 of 2) 04/16/2021   Pneumonia Vaccine  Completed   COVID-19 Vaccine  Completed   HPV Vaccine  Aged Out   Complete foot exam   Discontinued   Eye exam for diabetics  Discontinued  *Topic was postponed. The date shown is not the original due date.

## 2021-02-22 NOTE — Patient Instructions (Signed)

## 2021-02-26 ENCOUNTER — Other Ambulatory Visit (HOSPITAL_COMMUNITY): Payer: PPO

## 2021-02-26 ENCOUNTER — Inpatient Hospital Stay (HOSPITAL_COMMUNITY): Payer: PPO | Attending: Hematology

## 2021-02-26 ENCOUNTER — Other Ambulatory Visit: Payer: Self-pay

## 2021-02-26 ENCOUNTER — Ambulatory Visit (HOSPITAL_COMMUNITY)
Admission: RE | Admit: 2021-02-26 | Discharge: 2021-02-26 | Disposition: A | Discharge status: Home / Self Care | Payer: PPO | Source: Ambulatory Visit | Attending: Physician Assistant | Admitting: Physician Assistant

## 2021-02-26 DIAGNOSIS — D509 Iron deficiency anemia, unspecified: Secondary | ICD-10-CM

## 2021-02-26 DIAGNOSIS — N2889 Other specified disorders of kidney and ureter: Secondary | ICD-10-CM | POA: Diagnosis not present

## 2021-02-26 DIAGNOSIS — D259 Leiomyoma of uterus, unspecified: Secondary | ICD-10-CM | POA: Diagnosis not present

## 2021-02-26 DIAGNOSIS — E538 Deficiency of other specified B group vitamins: Secondary | ICD-10-CM | POA: Insufficient documentation

## 2021-02-26 DIAGNOSIS — N281 Cyst of kidney, acquired: Secondary | ICD-10-CM | POA: Diagnosis not present

## 2021-02-26 DIAGNOSIS — D3A092 Benign carcinoid tumor of the stomach: Secondary | ICD-10-CM

## 2021-02-26 DIAGNOSIS — N3289 Other specified disorders of bladder: Secondary | ICD-10-CM | POA: Diagnosis not present

## 2021-02-26 DIAGNOSIS — K8020 Calculus of gallbladder without cholecystitis without obstruction: Secondary | ICD-10-CM | POA: Diagnosis not present

## 2021-02-26 DIAGNOSIS — D510 Vitamin B12 deficiency anemia due to intrinsic factor deficiency: Secondary | ICD-10-CM

## 2021-02-26 LAB — CBC WITH DIFFERENTIAL/PLATELET
Abs Immature Granulocytes: 0.01 10*3/uL (ref 0.00–0.07)
Basophils Absolute: 0 10*3/uL (ref 0.0–0.1)
Basophils Relative: 0 %
Eosinophils Absolute: 0 10*3/uL (ref 0.0–0.5)
Eosinophils Relative: 1 %
HCT: 41 % (ref 36.0–46.0)
Hemoglobin: 12.9 g/dL (ref 12.0–15.0)
Immature Granulocytes: 0 %
Lymphocytes Relative: 18 %
Lymphs Abs: 0.8 10*3/uL (ref 0.7–4.0)
MCH: 32.6 pg (ref 26.0–34.0)
MCHC: 31.5 g/dL (ref 30.0–36.0)
MCV: 103.5 fL — ABNORMAL HIGH (ref 80.0–100.0)
Monocytes Absolute: 0.3 10*3/uL (ref 0.1–1.0)
Monocytes Relative: 7 %
Neutro Abs: 3.3 10*3/uL (ref 1.7–7.7)
Neutrophils Relative %: 74 %
Platelets: 176 10*3/uL (ref 150–400)
RBC: 3.96 MIL/uL (ref 3.87–5.11)
RDW: 12.2 % (ref 11.5–15.5)
WBC: 4.5 10*3/uL (ref 4.0–10.5)
nRBC: 0 % (ref 0.0–0.2)

## 2021-02-26 LAB — COMPREHENSIVE METABOLIC PANEL
ALT: 12 U/L (ref 0–44)
AST: 22 U/L (ref 15–41)
Albumin: 4 g/dL (ref 3.5–5.0)
Alkaline Phosphatase: 83 U/L (ref 38–126)
Anion gap: 9 (ref 5–15)
BUN: 29 mg/dL — ABNORMAL HIGH (ref 8–23)
CO2: 25 mmol/L (ref 22–32)
Calcium: 8.8 mg/dL — ABNORMAL LOW (ref 8.9–10.3)
Chloride: 106 mmol/L (ref 98–111)
Creatinine, Ser: 1.31 mg/dL — ABNORMAL HIGH (ref 0.44–1.00)
GFR, Estimated: 37 mL/min — ABNORMAL LOW (ref >60)
Glucose, Bld: 166 mg/dL — ABNORMAL HIGH (ref 70–99)
Potassium: 4.4 mmol/L (ref 3.5–5.1)
Sodium: 140 mmol/L (ref 135–145)
Total Bilirubin: 0.8 mg/dL (ref 0.3–1.2)
Total Protein: 7.4 g/dL (ref 6.5–8.1)

## 2021-02-26 LAB — IRON AND TIBC
Iron: 73 ug/dL (ref 28–170)
Saturation Ratios: 22 % (ref 10.4–31.8)
TIBC: 327 ug/dL (ref 250–450)
UIBC: 254 ug/dL

## 2021-02-26 LAB — FERRITIN: Ferritin: 78 ng/mL (ref 11–307)

## 2021-02-26 LAB — VITAMIN B12: Vitamin B-12: 901 pg/mL (ref 180–914)

## 2021-02-26 MED ORDER — IOHEXOL 300 MG/ML  SOLN
100.0000 mL | Freq: Once | INTRAMUSCULAR | Status: AC | PRN
  Administered 2021-02-26: 80 mL via INTRAVENOUS

## 2021-02-28 LAB — CHROMOGRANIN A: Chromogranin A (ng/mL): 824.1 ng/mL — ABNORMAL HIGH (ref 0.0–101.8)

## 2021-03-03 LAB — METHYLMALONIC ACID, SERUM: Methylmalonic Acid, Quantitative: 246 nmol/L (ref 0–378)

## 2021-03-05 ENCOUNTER — Inpatient Hospital Stay (HOSPITAL_BASED_OUTPATIENT_CLINIC_OR_DEPARTMENT_OTHER): Payer: PPO | Admitting: Physician Assistant

## 2021-03-05 ENCOUNTER — Inpatient Hospital Stay (HOSPITAL_COMMUNITY): Payer: PPO

## 2021-03-05 ENCOUNTER — Other Ambulatory Visit: Payer: Self-pay

## 2021-03-05 ENCOUNTER — Inpatient Hospital Stay (HOSPITAL_COMMUNITY): Payer: PPO | Admitting: Dietician

## 2021-03-05 VITALS — BP 149/67 | HR 91 | Temp 98.7°F | Resp 18 | Ht 60.0 in | Wt 95.9 lb

## 2021-03-05 DIAGNOSIS — D510 Vitamin B12 deficiency anemia due to intrinsic factor deficiency: Secondary | ICD-10-CM

## 2021-03-05 DIAGNOSIS — D3A092 Benign carcinoid tumor of the stomach: Secondary | ICD-10-CM | POA: Diagnosis not present

## 2021-03-05 DIAGNOSIS — N2889 Other specified disorders of kidney and ureter: Secondary | ICD-10-CM

## 2021-03-05 DIAGNOSIS — D509 Iron deficiency anemia, unspecified: Secondary | ICD-10-CM

## 2021-03-05 DIAGNOSIS — E538 Deficiency of other specified B group vitamins: Secondary | ICD-10-CM | POA: Diagnosis not present

## 2021-03-05 MED ORDER — CYANOCOBALAMIN 1000 MCG/ML IJ SOLN
1000.0000 ug | Freq: Once | INTRAMUSCULAR | Status: AC
  Administered 2021-03-05: 1000 ug via INTRAMUSCULAR
  Filled 2021-03-05: qty 1

## 2021-03-05 NOTE — Progress Notes (Signed)
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 She has history of  IDA, receiving intermittent Feraheme.  Met with patient and son in clinic. Patient reports good appetite. Patient son reports chewing difficulty with meats. She "chews and chews but won't swallow it" Patient reports eating [employer] hamburgers and chicken nuggets from [employer]. Son reports she drinks "hardly any water" and will drink Boost occasionally. Patient denies nausea, vomiting, diarrhea, constipation.   Medications: reviewed  Labs: 2/06 - glucose 166, Cr 1.31, BUN 1.31  Anthropometrics: Weight 95 lb 14.4 oz today decreased 3% in the last month; insignificant   1/16 - 98 lb 1.3 oz 9/15 - 97 lb 1.9 oz 8/15 - 90 lb 14.4 oz  NUTRITION DIAGNOSIS: Inadequate oral intake continues    INTERVENTION:  Reinforced importance of adequate calories and protein energy intake to maintain weights/strength Encouraged soft moist high protein foods for ease of intake - handout with ideas provided Discussed ways to add calories and protein to foods (adding cheese and gravies to foods, cooking with butter, creamy soups made with whole milk vs water) Recommend drinking 1-2 Boost Plus/equivalent daily - coupons provided Son has contact information     MONITORING, EVALUATION, GOAL: weight trends, intake   NEXT VISIT: To be scheduled as needed

## 2021-03-05 NOTE — Patient Instructions (Signed)
[place]  Discharge Instructions: Thank you for choosing [facility] to provide your oncology and hematology care.  If you have a lab appointment with the [facility], please come in thru the Main Entrance and check in at the main information desk.  Wear comfortable clothing and clothing appropriate for easy access to any Portacath or PICC line.   We strive to give you quality time with your provider. You may need to reschedule your appointment if you arrive late (15 or more minutes).  Arriving late affects you and other patients whose appointments are after yours.  Also, if you miss three or more appointments without notifying the office, you may be dismissed from the clinic at the providers discretion.      For prescription refill requests, have your pharmacy contact our office and allow 72 hours for refills to be completed.    Today you received the following B12 injection, return as scheduled.   To help prevent nausea and vomiting after your treatment, we encourage you to take your nausea medication as directed.  BELOW ARE SYMPTOMS THAT SHOULD BE REPORTED IMMEDIATELY: *FEVER GREATER THAN 100.4 F (38 C) OR HIGHER *CHILLS OR SWEATING *NAUSEA AND VOMITING THAT IS NOT CONTROLLED WITH YOUR NAUSEA MEDICATION *UNUSUAL SHORTNESS OF BREATH *UNUSUAL BRUISING OR BLEEDING *URINARY PROBLEMS (pain or burning when urinating, or frequent urination) *BOWEL PROBLEMS (unusual diarrhea, constipation, pain near the anus) TENDERNESS IN MOUTH AND THROAT WITH OR WITHOUT PRESENCE OF ULCERS (sore throat, sores in mouth, or a toothache) UNUSUAL RASH, SWELLING OR PAIN  UNUSUAL VAGINAL DISCHARGE OR ITCHING   Items with * indicate a potential emergency and should be followed up as soon as possible or go to the Emergency Department if any problems should occur.  Please show the CHEMOTHERAPY ALERT CARD or IMMUNOTHERAPY ALERT CARD at check-in to the Emergency Department and triage  nurse.  Should you have questions after your visit or need to cancel or reschedule your appointment, please contact [facility] [phone]  and follow the prompts.  Office hours are 8:00 a.m. to 4:30 p.m. Monday - Friday. Please note that voicemails left after 4:00 p.m. may not be returned until the following business day.  We are closed weekends and major holidays. You have access to a nurse at all times for urgent questions. Please call the main number to the clinic [phone] and follow the prompts.  For any non-urgent questions, you may also contact your provider using MyChart. We now offer e-Visits for anyone 76 and older to request care online for non-urgent symptoms. For details visit [email or web address].   Also download the MyChart app! Go to the app store, search "MyChart", open the app, select [facility], and log in with your MyChart username and password.  Due to Covid, a mask is required upon entering the hospital/clinic. If you do not have a mask, one will be given to you upon arrival. For doctor visits, patients may have 1 support person aged 52 or older with them. For treatment visits, patients cannot have anyone with them due to current Covid guidelines and our immunocompromised population.

## 2021-03-05 NOTE — Patient Instructions (Signed)
[place] at [facility] Discharge Instructions  You were seen today by Tarri Abernethy PA-C for your iron deficiency anemia and history of gastric carcinoid tumor.  Your blood and iron levels looked great.  You do not need any IV iron today.  We will check your levels again and see you back in 6 months.  Regarding your gastric carcinoid tumor, your tumor marker labs are stable, but your recent CT scan did show some possible growth in your gastric tumor.  Since you do not have any symptoms of this, there is no treatment needed at this time.  We will check your CT scan again in 6 months.  You most recent CT scan also showed a spot on your kidney that most likely represents a type of kidney cancer known as renal cell carcinoma.  Due to your advanced age, surgery or chemotherapy is not advised.  We will continue to follow-up with another CT scan in 6 months.  Continue your monthly B12 injections.  Your weight is a little bit low, please make sure that you eat adequate nutrition.  We have sent a referral for our nutritionist to see you as well.  LABS: Return in 6 months for repeat labs  OTHER TESTS: Repeat CT scan in 6 months  FOLLOW-UP APPOINTMENT: Office visit in 6 months (1 week after labs)   Thank you for choosing [place] at [facility] to provide your oncology and hematology care.  To afford each patient quality time with our provider, please arrive at least 15 minutes before your scheduled appointment time.   If you have a lab appointment with the [facility] please come in thru the Main Entrance and check in at the main information desk.  You need to re-schedule your appointment should you arrive 10 or more minutes late.  We strive to give you quality time with our providers, and arriving late affects you and other patients whose appointments are after yours.  Also, if you no show three or more times for appointments you may be  dismissed from the clinic at the providers discretion.     Again, thank you for choosing [facility].  Our hope is that these requests will decrease the amount of time that you wait before being seen by our physicians.       _____________________________________________________________  Should you have questions after your visit to [facility], please contact our office at [phone] and follow the prompts.  Our office hours are 8:00 a.m. and 4:30 p.m. Monday - Friday.  Please note that voicemails left after 4:00 p.m. may not be returned until the following business day.  We are closed weekends and major holidays.  You do have access to a nurse 24-7, just call the main number to the clinic [phone] and do not press any options, hold on the line and a nurse will answer the phone.    For prescription refill requests, have your pharmacy contact our office and allow 72 hours.    Due to Covid, you will need to wear a mask upon entering the hospital. If you do not have a mask, a mask will be given to you at the Main Entrance upon arrival. For doctor visits, patients may have 1 support person age 90 or older with them. For treatment visits, patients can not have anyone with them due to social distancing guidelines and our immunocompromised population.

## 2021-03-05 NOTE — Progress Notes (Signed)
Patient tolerated B12 injection with no complaints voiced. Site clean and dry with no bruising or swelling noted at site. See MAR for details. Band aid applied.  Patient stable during and after injection. VSS with discharge and left in satisfactory condition with no s/s of distress noted. 

## 2021-03-05 NOTE — Progress Notes (Deleted)
Temp

## 2021-03-05 NOTE — Progress Notes (Signed)
[place], [place] [id number]   CLINIC:  Medical Oncology/Hematology  PCP:  [name], MD [address] 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 female returns for routine follow-up of her neuroendocrine carcinoid tumor of the stomach and her iron deficiency anemia.  She was last seen by Tarri Abernethy PA-C on 09/04/2020.   At today's visit, she reports feeling "pretty good for her age."  No recent hospitalizations, surgeries, or changes in baseline health status.   She denies any symptoms of carcinoid syndrome.  No wheezing, abdominal pain or cramping, diarrhea, or skin flushing.  No B symptoms such as fever, chills, night sweats, unintentional weight loss.    No fatigue, fever, chills, shortness of breath, cough, chest pain, nausea, vomiting, abdominal pain.  Denies any signs or symptoms of blood loss.  No current signs or symptoms of blood clots.  She has not noticed any recent bleeding such as hematemesis, hematochezia, melena, or epistaxis.     She has 100% energy and 100% appetite. She endorses that she is maintaining a stable weight.   REVIEW OF SYSTEMS:  Review of Systems  Constitutional:  Negative for appetite change, chills, diaphoresis, fatigue, fever and unexpected weight change.  HENT:   Negative for lump/mass and nosebleeds.   Eyes:  Negative for eye problems.  Respiratory:  Negative for cough, hemoptysis and shortness of breath.   Cardiovascular:  Negative for chest pain, leg swelling and palpitations.  Gastrointestinal:  Negative for abdominal pain, blood in stool, constipation, diarrhea, nausea and vomiting.  Genitourinary:  Negative for hematuria.   Skin: Negative.   Neurological:   Negative for dizziness, headaches and light-headedness.  Hematological:  Does not bruise/bleed easily.     PAST MEDICAL/SURGICAL HISTORY:  Past Medical History:  Diagnosis Date   Adult BMI 19-24 kg/sq m 2008 125 lbs   Anemia    Pernicious 2008-HB 12 MCV 119.5; JAN 2012 HB 13.2 MCV 97.6   Carcinoid tumor of stomach 2008   Diverticulosis    GERD (gastroesophageal reflux disease)    Hiatal hernia    HTN (hypertension)    Hyperlipidemia    Hypertension    Phreesia 02/07/2020   Mild memory loss 02/15/2011   Near syncope 04/18/2014   Pernicious anemia    Pernicious anemia 06/14/2010   Wrist fracture, left 2008   Past Surgical History:  Procedure Laterality Date   COLONOSCOPY  2003 LS   DC/[place] Diverticulosis   EUS  2008 DJ   FNA NO CARCINOID   EUS  JAN 2009   NL EXAM, NO Bx   EUS  [facility] 2011 WO   CARCINOID   UPPER GASTROINTESTINAL ENDOSCOPY  APR 2008 [facility]   CARCIONOID   UPPER GASTROINTESTINAL ENDOSCOPY  FEB 2011 [facility]   CARCINOID/mild anemia/large hiatal hernia     SOCIAL HISTORY:  Social History   Socioeconomic History   Marital status: Widowed    Spouse name: Not on file   Number of children: 1   Years of education: 39   Highest education level: Not on file  Occupational History   Not on file  Tobacco Use   Smoking status: Never   Smokeless tobacco: Never  Vaping Use   Vaping Use: Never used  Substance and Sexual Activity   Alcohol use: No   Drug use: No   Sexual activity: Not Currently    Birth control/protection: Post-menopausal  Other Topics Concern   Not on file  Social History Narrative   Live with son Ray   Social Determinants of Health   Financial Resource Strain: Low Risk    Difficulty of Paying Living Expenses: Not hard at all  Food Insecurity: No Food Insecurity   Worried About Charity fundraiser in the Last Year: Never true   [place] in the Last Year: Never true  Transportation Needs: No Transportation Needs   Lack of Transportation  (Medical): No   Lack of Transportation (Non-Medical): No  Physical Activity: Inactive   Days of Exercise per Week: 0 days   Minutes of Exercise per Session: 0 min  Stress: No Stress Concern Present   Feeling of Stress : Not at all  Social Connections: Moderately Integrated   Frequency of Communication with Friends and Family: Three times a week   Frequency of Social Gatherings with Friends and Family: More than three times a week   Attends Religious Services: More than 4 times per year   Active Member of [employer] or Organizations: Yes   Attends Archivist Meetings: Never   Marital Status: Widowed  Human resources officer Violence: Not At Risk   Fear of Current or Ex-Partner: No   Emotionally Abused: No   Physically Abused: No   Sexually Abused: No    FAMILY HISTORY:  Family History  Problem Relation Age of Onset   Cancer Mother    Heart disease Brother    Kidney disease Son    Colon cancer Neg Hx    Colon polyps Neg Hx     CURRENT MEDICATIONS:  Outpatient Encounter Medications as of 03/05/2021  Medication Sig   acetaminophen (TYLENOL) 500 MG tablet Take 500 mg by mouth every 6 (six) hours as needed. For pain   donepezil (ARICEPT) 5 MG tablet Take 1 tablet (5 mg total) by mouth at bedtime.   felodipine (PLENDIL) 5 MG 24 hr tablet Take 1 tablet (5 mg total) by mouth daily.   levothyroxine (SYNTHROID) 25 MCG tablet Take 1 tablet (25 mcg total) by mouth daily before breakfast.   simvastatin (ZOCOR) 40 MG tablet Take 1 tablet (40 mg total) by mouth daily.   valsartan (DIOVAN) 40 MG tablet Take 1 tablet (40 mg total) by mouth daily.   No facility-administered encounter medications on file as of 03/05/2021.    ALLERGIES:  Allergies  Allergen Reactions   Codeine      PHYSICAL EXAM:  ECOG PERFORMANCE STATUS: 0 - Asymptomatic  Vitals:   03/05/21 1018  BP: (!) 149/67  Pulse: 91  Resp: 18  Temp: 98.7 F (37.1 C)  SpO2: 100%   Filed Weights   03/05/21 1018  Weight:  95 lb 14.4 oz (43.5 kg)   Physical Exam Constitutional:      Appearance: Normal appearance. She is underweight.  HENT:     Head: Normocephalic and atraumatic.     Mouth/Throat:     Mouth: Mucous membranes are moist.  Eyes:     Extraocular Movements: Extraocular movements intact.     Pupils: Pupils are equal, round, and reactive to light.  Cardiovascular:     Rate and Rhythm: Normal rate and regular rhythm.     Pulses: Normal pulses.     Heart sounds: Normal heart sounds.  Pulmonary:  Effort: Pulmonary effort is normal.     Breath sounds: Normal breath sounds.  Abdominal:     General: Bowel sounds are normal.     Palpations: Abdomen is soft.     Tenderness: There is no abdominal tenderness.  Musculoskeletal:        General: No swelling.     Right lower leg: No edema.     Left lower leg: No edema.  Lymphadenopathy:     Cervical: No cervical adenopathy.  Skin:    General: Skin is warm and dry.  Neurological:     General: No focal deficit present.     Mental Status: She is alert and oriented to person, place, and time.  Psychiatric:        Mood and Affect: Mood normal.        Behavior: Behavior normal.     LABORATORY DATA:  I have reviewed the labs as listed.  CBC    Component Value Date/Time   WBC 4.5 02/26/2021 0924   RBC 3.96 02/26/2021 0924   HGB 12.9 02/26/2021 0924   HCT 41.0 02/26/2021 0924   PLT 176 02/26/2021 0924   MCV 103.5 (H) 02/26/2021 0924   MCH 32.6 02/26/2021 0924   MCHC 31.5 02/26/2021 0924   RDW 12.2 02/26/2021 0924   LYMPHSABS 0.8 02/26/2021 0924   MONOABS 0.3 02/26/2021 0924   EOSABS 0.0 02/26/2021 0924   BASOSABS 0.0 02/26/2021 0924   CMP Latest Ref Rng & Units 02/26/2021 08/28/2020 07/19/2020  Glucose 70 - 99 mg/dL 166(H) 139(H) 94  BUN 8 - 23 mg/dL 29(H) 15 16  Creatinine 0.44 - 1.00 mg/dL 1.31(H) 1.04(H) 1.02(H)  Sodium 135 - 145 mmol/L 140 140 145(H)  Potassium 3.5 - 5.1 mmol/L 4.4 3.4(L) 3.7  Chloride 98 - 111 mmol/L 106 105 105   CO2 22 - 32 mmol/L 25 28 24   Calcium 8.9 - 10.3 mg/dL 8.8(L) 9.0 9.1  Total Protein 6.5 - 8.1 g/dL 7.4 8.1 -  Total Bilirubin 0.3 - 1.2 mg/dL 0.8 1.0 -  Alkaline Phos 38 - 126 U/L 83 102 -  AST 15 - 41 U/L 22 22 -  ALT 0 - 44 U/L 12 10 -    DIAGNOSTIC IMAGING:  I have independently reviewed the relevant imaging and discussed with the patient.  ASSESSMENT & PLAN: 1.  Well-differentiated type 1 gastric carcinoid - EGD biopsy of the stomach in 2011 consistent with carcinoid tumor. - She has been on active surveillance since 2011 - She does not have any symptoms of carcinoid syndrome.  No B symptoms.  - CT CAP from 02/08/2020 which did not show any evidence of metastatic carcinoid.  Several small stable pulmonary nodules are present.  No obvious gastric mass or other indicator of active neuroendocrine tumor. - CT abdomen/pelvis (02/26/2021): Vividly enhancing endoluminal nodules within the herniated gastric body, the right aspect measuring 0.7 cm and at the left aspect measuring 0.4 cm; concerning for recurrent or metastatic gastric carcinoid given patient's known history.  (Other findings include renal mass discussed below, and additional incidental findings per radiology report) - Most recent Chromogranin A level 824.1 (was 713.7 on 08/28/2020) - Discussed findings of most recent CT abdomen/pelvis with Dr. Delton Coombes, who recommends continued close observation at this time, in the setting of asymptomatic patient with advanced age.  This was discussed with patient, who agrees. - PLAN:  RTC in 6 months with repeat labs including chromogranin level.  Repeat CT abdomen/pelvis in 6 months.  MD visit  in 6 months to discuss results and next steps.  2.  Left kidney lesion - CT abdomen/pelvis (02/26/2021) showed exophytic, mixed solid and cystic lesion of the superior pole of the left kidney measuring 2.9 x 1.9 cm with thick nodular enhancing internal septations, slightly enlarged compared to prior  examination.  This is consistent with a Bosniak category 4 lesion, concerning for renal cell carcinoma - Discussed findings of most recent CT abdomen/pelvis with Dr. Delton Coombes, who recommends continued close observation at this time, in the setting of asymptomatic patient with advanced age.  This was discussed with patient, who agrees. - PLAN: Given her advanced age and lack of symptoms, patient prefers active follow-up rather than treatment at this time.  She has stated that she is not interested in either chemotherapy or surgery.  Repeat CT abdomen/pelvis in 6 months.  MD visit 6 months to stress results and next steps.   3.  Weight loss   - Patient reports good appetite and denies any unintentional weight loss. - She was previously noted to have lost about 10 pounds in 6 months, but her weight has stabilized and remains - Low BMI of 18.73 (03/05/2021) - She has been seen by dietitian.  Recommended to drink 1-2 Boost Plus/equivalent daily - PLAN: Continue to monitor at follow-up appointments   4.  B12 deficiency with history of pernicious anemia: - She is on monthly B12 injections. - Most recent labs (02/26/2021): B12 normal at 901 with normal methylmalonic acid - PLAN: Continue monthly B12 injections.  Recheck B12 and methylmalonic acid with repeat labs and RTC in 6 months.   5.  Iron deficiency state: - She is receiving intermittent iron infusions.  (Last with IV Feraheme x2 in January 2022) - She denies any signs or symptoms of blood loss such as bright red blood per rectum or melena - Suspect malabsorption secondary to chronic atrophic gastritis and pernicious anemia - Most recent labs (02/26/2021): Hgb 12.9, ferritin 78, iron saturation 22 % - PLAN: No indication for IV iron at this time.  Repeat labs and RTC in 6 months.   6.  Bedbugs - Patient has been previously noted to have bedbugs during office visit (09/04/2020) - No bedbugs noted on today's exam   PLAN SUMMARY &  DISPOSITION: Monthly B12 injections Labs in 6 months CT abdomen/pelvis in 6 months MD visit after labs/CT  All questions were answered. The patient knows to call the clinic with any problems, questions or concerns.  Medical decision making: Moderate  Time spent on visit: I spent 20 minutes counseling the patient face to face. The total time spent in the appointment was 30 minutes and more than 50% was on counseling.  ** Patient's son, [name], was present during this visit and the above results and plan were communicated with him.  All questions were answered and he agrees with the above plan.   Harriett Rush, PA-C  03/05/2021 7:05 PM

## 2021-04-02 ENCOUNTER — Ambulatory Visit (HOSPITAL_COMMUNITY): Payer: PPO

## 2021-04-03 ENCOUNTER — Other Ambulatory Visit: Payer: Self-pay

## 2021-04-03 ENCOUNTER — Inpatient Hospital Stay (HOSPITAL_COMMUNITY): Payer: PPO | Attending: Oncology

## 2021-04-03 VITALS — BP 158/77 | HR 75 | Temp 98.1°F | Resp 18

## 2021-04-03 DIAGNOSIS — D510 Vitamin B12 deficiency anemia due to intrinsic factor deficiency: Secondary | ICD-10-CM

## 2021-04-03 DIAGNOSIS — E538 Deficiency of other specified B group vitamins: Secondary | ICD-10-CM | POA: Diagnosis not present

## 2021-04-03 DIAGNOSIS — D509 Iron deficiency anemia, unspecified: Secondary | ICD-10-CM

## 2021-04-03 MED ORDER — CYANOCOBALAMIN 1000 MCG/ML IJ SOLN
1000.0000 ug | Freq: Once | INTRAMUSCULAR | Status: AC
  Administered 2021-04-03: 1000 ug via INTRAMUSCULAR
  Filled 2021-04-03: qty 1

## 2021-04-03 NOTE — Patient Instructions (Signed)
[name]  Discharge Instructions: ?Thank you for choosing [facility] to provide your oncology and hematology care.  ?If you have a lab appointment with the Cancer Center, please come in thru the Main Entrance and check in at the main information desk. ? ?Wear comfortable clothing and clothing appropriate for easy access to any Portacath or PICC line.  ? ?We strive to give you quality time with your provider. You may need to reschedule your appointment if you arrive late (15 or more minutes).  Arriving late affects you and other patients whose appointments are after yours.  Also, if you miss three or more appointments without notifying the office, you may be dismissed from the clinic at the provider?s discretion.    ?  ?For prescription refill requests, have your pharmacy contact our office and allow 72 hours for refills to be completed.   ? ?Today you received B12 injection ?  ? ? ?BELOW ARE SYMPTOMS THAT SHOULD BE REPORTED IMMEDIATELY: ?*FEVER GREATER THAN 100.4 F (38 ?C) OR HIGHER ?*CHILLS OR SWEATING ?*NAUSEA AND VOMITING THAT IS NOT CONTROLLED WITH YOUR NAUSEA MEDICATION ?*UNUSUAL SHORTNESS OF BREATH ?*UNUSUAL BRUISING OR BLEEDING ?*URINARY PROBLEMS (pain or burning when urinating, or frequent urination) ?*BOWEL PROBLEMS (unusual diarrhea, constipation, pain near the anus) ?TENDERNESS IN MOUTH AND THROAT WITH OR WITHOUT PRESENCE OF ULCERS (sore throat, sores in mouth, or a toothache) ?UNUSUAL RASH, SWELLING OR PAIN  ?UNUSUAL VAGINAL DISCHARGE OR ITCHING  ? ?Items with * indicate a potential emergency and should be followed up as soon as possible or go to the Emergency Department if any problems should occur. ? ?Please show the CHEMOTHERAPY ALERT CARD or IMMUNOTHERAPY ALERT CARD at check-in to the Emergency Department and triage nurse. ? ?Should you have questions after your visit or need to cancel or reschedule your appointment, please contact [facility] [phone]   and follow the prompts.  Office hours are 8:00 a.m. to 4:30 p.m. Monday - Friday. Please note that voicemails left after 4:00 p.m. may not be returned until the following business day.  We are closed weekends and major holidays. You have access to a nurse at all times for urgent questions. Please call the main number to the clinic [phone] and follow the prompts. ? ?For any non-urgent questions, you may also contact your provider using MyChart. We now offer e-Visits for anyone 18 and older to request care online for non-urgent symptoms. For details visit [email or web address]. ?  ?Also download the MyChart app! Go to the app store, search "MyChart", open the app, select [facility], and log in with your MyChart username and password. ? ?Due to Covid, a mask is required upon entering the hospital/clinic. If you do not have a mask, one will be given to you upon arrival. For doctor visits, patients may have 1 support person aged 18 or older with them. For treatment visits, patients cannot have anyone with them due to current Covid guidelines and our immunocompromised population.  ?

## 2021-04-03 NOTE — Progress Notes (Signed)
[name] presents today for injection per the provider's orders.  B12 administration without incident; injection site WNL; see MAR for injection details.  Patient tolerated procedure well and without incident.  No questions or complaints noted at this time.  ? ?Discharged from clinic ambulatory in stable condition. Alert and oriented x 3. F/U with [facility] as scheduled.   ?

## 2021-04-23 ENCOUNTER — Other Ambulatory Visit: Payer: Self-pay | Admitting: Internal Medicine

## 2021-04-23 DIAGNOSIS — F0280 Dementia in other diseases classified elsewhere without behavioral disturbance: Secondary | ICD-10-CM

## 2021-04-24 ENCOUNTER — Other Ambulatory Visit: Payer: Self-pay | Admitting: *Deleted

## 2021-04-24 DIAGNOSIS — E782 Mixed hyperlipidemia: Secondary | ICD-10-CM

## 2021-04-24 MED ORDER — SIMVASTATIN 40 MG PO TABS: 40.0000 mg | ORAL_TABLET | Freq: Every day | ORAL | 3 refills | Status: DC

## 2021-05-04 ENCOUNTER — Ambulatory Visit (HOSPITAL_COMMUNITY): Payer: PPO

## 2021-05-07 DIAGNOSIS — I739 Peripheral vascular disease, unspecified: Secondary | ICD-10-CM | POA: Diagnosis not present

## 2021-05-07 DIAGNOSIS — B351 Tinea unguium: Secondary | ICD-10-CM | POA: Diagnosis not present

## 2021-05-10 ENCOUNTER — Encounter (HOSPITAL_COMMUNITY): Payer: Self-pay

## 2021-05-10 ENCOUNTER — Inpatient Hospital Stay (HOSPITAL_COMMUNITY): Payer: PPO | Attending: Oncology

## 2021-05-10 VITALS — BP 134/67 | HR 81 | Temp 98.3°F | Resp 18

## 2021-05-10 DIAGNOSIS — D510 Vitamin B12 deficiency anemia due to intrinsic factor deficiency: Secondary | ICD-10-CM

## 2021-05-10 DIAGNOSIS — E538 Deficiency of other specified B group vitamins: Secondary | ICD-10-CM | POA: Diagnosis not present

## 2021-05-10 DIAGNOSIS — D509 Iron deficiency anemia, unspecified: Secondary | ICD-10-CM

## 2021-05-10 MED ORDER — CYANOCOBALAMIN 1000 MCG/ML IJ SOLN
1000.0000 ug | Freq: Once | INTRAMUSCULAR | Status: AC
  Administered 2021-05-10: 1000 ug via INTRAMUSCULAR
  Filled 2021-05-10: qty 1

## 2021-05-10 NOTE — Patient Instructions (Signed)
[facility]  Discharge Instructions: ?Thank you for choosing [facility] to provide your oncology and hematology care.  ?If you have a lab appointment with the [facility], please come in thru the Main Entrance and check in at the main information desk. ? ?Wear comfortable clothing and clothing appropriate for easy access to any Portacath or PICC line.  ? ?We strive to give you quality time with your provider. You may need to reschedule your appointment if you arrive late (15 or more minutes).  Arriving late affects you and other patients whose appointments are after yours.  Also, if you miss three or more appointments without notifying the office, you may be dismissed from the clinic at the provider?s discretion.    ?  ?For prescription refill requests, have your pharmacy contact our office and allow 72 hours for refills to be completed.   ? ?Today you received the following B12 injection, return as scheduled. ?  ?To help prevent nausea and vomiting after your treatment, we encourage you to take your nausea medication as directed. ? ?BELOW ARE SYMPTOMS THAT SHOULD BE REPORTED IMMEDIATELY: ?*FEVER GREATER THAN 100.4 F (38 ?C) OR HIGHER ?*CHILLS OR SWEATING ?*NAUSEA AND VOMITING THAT IS NOT CONTROLLED WITH YOUR NAUSEA MEDICATION ?*UNUSUAL SHORTNESS OF BREATH ?*UNUSUAL BRUISING OR BLEEDING ?*URINARY PROBLEMS (pain or burning when urinating, or frequent urination) ?*BOWEL PROBLEMS (unusual diarrhea, constipation, pain near the anus) ?TENDERNESS IN MOUTH AND THROAT WITH OR WITHOUT PRESENCE OF ULCERS (sore throat, sores in mouth, or a toothache) ?UNUSUAL RASH, SWELLING OR PAIN  ?UNUSUAL VAGINAL DISCHARGE OR ITCHING  ? ?Items with * indicate a potential emergency and should be followed up as soon as possible or go to the Emergency Department if any problems should occur. ? ?Please show the CHEMOTHERAPY ALERT CARD or IMMUNOTHERAPY ALERT CARD at check-in to the Emergency Department and triage  nurse. ? ?Should you have questions after your visit or need to cancel or reschedule your appointment, please contact [facility] [phone]  and follow the prompts.  Office hours are 8:00 a.m. to 4:30 p.m. Monday - Friday. Please note that voicemails left after 4:00 p.m. may not be returned until the following business day.  We are closed weekends and major holidays. You have access to a nurse at all times for urgent questions. Please call the main number to the clinic [phone] and follow the prompts. ? ?For any non-urgent questions, you may also contact your provider using MyChart. We now offer e-Visits for anyone 21 and older to request care online for non-urgent symptoms. For details visit [email or web address]. ?  ?Also download the MyChart app! Go to the app store, search "MyChart", open the app, select [facility], and log in with your MyChart username and password. ? ?Due to Covid, a mask is required upon entering the hospital/clinic. If you do not have a mask, one will be given to you upon arrival. For doctor visits, patients may have 1 support person aged 78 or older with them. For treatment visits, patients cannot have anyone with them due to current Covid guidelines and our immunocompromised population.  ?

## 2021-05-10 NOTE — Progress Notes (Signed)
Patient tolerated B12 injection with no complaints voiced. Site clean and dry with no bruising or swelling noted at site. See MAR for details. Band aid applied.  Patient stable during and after injection. VSS with discharge and left in satisfactory condition with no s/s of distress noted. 

## 2021-05-29 ENCOUNTER — Encounter: Payer: Self-pay | Admitting: Emergency Medicine

## 2021-05-29 ENCOUNTER — Ambulatory Visit
Admission: EM | Admit: 2021-05-29 | Discharge: 2021-05-29 | Disposition: A | Discharge status: Home / Self Care | Payer: PPO | Attending: Nurse Practitioner | Admitting: Nurse Practitioner

## 2021-05-29 DIAGNOSIS — H019 Unspecified inflammation of eyelid: Secondary | ICD-10-CM

## 2021-05-29 MED ORDER — TOBRAMYCIN-DEXAMETHASONE 0.3-0.1 % OP SUSP: 1.0000 [drp] | OPHTHALMIC | 0 refills | Status: AC

## 2021-05-29 NOTE — ED Triage Notes (Signed)
Bilateral eye redness x a couple of weeks.  States eyes water at times. ?

## 2021-05-29 NOTE — Discharge Instructions (Signed)
Use medication as prescribed. ?Try to avoid rubbing or itching the eye while symptoms persist. ?Cool compresses to the eye to help with redness and inflammation. ?Follow-up with primary care physician if symptoms do not improve. ? ?

## 2021-05-29 NOTE — ED Provider Notes (Signed)
?[facility] ? ? ? ?CSN: [id number] ?Arrival date & time: 05/29/21  1536 ? ? ?  ? ?History   ?Chief Complaint ?No chief complaint on file. ? ? ?HPI ?[name] is a 88 y.o. female.  ? ?Patient is a 88 year old female brought in by her son for complaints of bilateral eye redness.  Symptoms have been present for the past 2 weeks per the patient's son.  She has redness to the lower eyelids.  Patient denies any pain, blurred vision, decreased vision, change in vision, she states that she does have occasional tearing.  She does wear glasses.  Patient has not taken any medication for her symptoms.  Patient's blood pressure is also moderately elevated today.  Patient's son thinks that she may have forgotten to take her medication. ? ?The history is provided by the patient and a relative.  ? ?Past Medical History:  ?Diagnosis Date  ? Adult BMI 19-24 kg/sq m 2008 125 lbs  ? Anemia   ? Pernicious 2008-HB 12 MCV 119.5; JAN 2012 HB 13.2 MCV 97.6  ? Carcinoid tumor of stomach 2008  ? Diverticulosis   ? GERD (gastroesophageal reflux disease)   ? Hiatal hernia   ? HTN (hypertension)   ? Hyperlipidemia   ? Hypertension   ? Phreesia 02/07/2020  ? Mild memory loss 02/15/2011  ? Near syncope 04/18/2014  ? Pernicious anemia   ? Pernicious anemia 06/14/2010  ? Wrist fracture, left 2008  ? ? ?Patient Active Problem List  ? Diagnosis Date Noted  ? Stage 3a chronic kidney disease ([place]) 02/05/2021  ? Encounter for general adult medical examination with abnormal findings 02/05/2021  ? Late onset Alzheimer's dementia without behavioral disturbance ([place]) 04/06/2020  ? Hypothyroidism 02/10/2020  ? Renal mass 02/10/2020  ? Pulmonary nodule 02/10/2020  ? Iron deficiency anemia 01/26/2020  ? Carotid disease, bilateral ([place]) 04/19/2014  ? Mild memory loss 02/15/2011  ? Pernicious anemia 06/14/2010  ? 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 HLD (hyperlipidemia) 04/03/2009  ? Essential hypertension 04/03/2009  ? GERD  04/03/2009  ? ? ?Past Surgical History:  ?Procedure Laterality Date  ? COLONOSCOPY  2003 LS  ? DC/[place] Diverticulosis  ? EUS  2008 DJ  ? FNA NO CARCINOID  ? EUS  JAN 2009  ? NL EXAM, NO Bx  ? EUS  [facility] 2011 WO  ? CARCINOID  ? UPPER GASTROINTESTINAL ENDOSCOPY  APR 2008 [facility]  ? CARCIONOID  ? UPPER GASTROINTESTINAL ENDOSCOPY  FEB 2011 [facility]  ? CARCINOID/mild anemia/large hiatal hernia  ? ? ?OB History   ?No obstetric history on file. ?  ? ? ? ?Home Medications   ? ?Prior to Admission medications   ?Medication Sig Start Date End Date Taking? Authorizing Provider  ?tobramycin-dexamethasone [facility]) ophthalmic solution Place 1 drop into both eyes every 4 (four) hours while awake for 7 days. 05/29/21 06/05/21 Yes Marshel Golubski-Warren, Alda Lea, NP  ?acetaminophen (TYLENOL) 500 MG tablet Take 500 mg by mouth every 6 (six) hours as needed. For pain ?Patient not taking: Reported on 04/03/2021    [provider]  ?donepezil (ARICEPT) 5 MG tablet TAKE 1 TABLET BY MOUTH AT BEDTIME 04/23/21   [name], MD  ?felodipine (PLENDIL) 5 MG 24 hr tablet Take 1 tablet (5 mg total) by mouth daily. 02/05/21   [name], MD  ?levothyroxine (SYNTHROID) 25 MCG tablet Take 1 tablet (25 mcg total) by mouth daily before breakfast. 01/02/21   [name], MD  ?  simvastatin (ZOCOR) 40 MG tablet Take 1 tablet (40 mg total) by mouth daily. 04/24/21   [name], MD  ?valsartan (DIOVAN) 40 MG tablet Take 1 tablet (40 mg total) by mouth daily. 02/05/21   [name], MD  ? ? ?Family History ?Family History  ?Problem Relation Age of Onset  ? Cancer Mother   ? Heart disease Brother   ? Kidney disease Son   ? Colon cancer Neg Hx   ? Colon polyps Neg Hx   ? ? ?Social History ?Social History  ? ?Tobacco Use  ? Smoking status: Never  ? Smokeless tobacco: Never  ?Vaping Use  ? Vaping Use: Never used  ?Substance Use Topics  ? Alcohol use: No  ? Drug use: No  ? ? ? ?Allergies   ?Codeine ? ? ?Review of Systems ?Review of Systems  ?Constitutional:  Negative.   ?Eyes:  Positive for discharge and redness. Negative for photophobia, pain, itching and visual disturbance.  ?Respiratory: Negative.    ?Cardiovascular: Negative.   ?Skin: Negative.   ?Psychiatric/Behavioral: Negative.    ? ? ?Physical Exam ?Triage Vital Signs ?ED Triage Vitals  ?Enc Vitals Group  ?   BP 05/29/21 1625 (!) 218/95  ?   Pulse Rate 05/29/21 1625 82  ?   Resp 05/29/21 1625 18  ?   Temp 05/29/21 1625 98.6 ?F (37 ?C)  ?   Temp Source 05/29/21 1625 Oral  ?   SpO2 05/29/21 1625 97 %  ?   Weight --   ?   Height --   ?   Head Circumference --   ?   Peak Flow --   ?   Pain Score 05/29/21 1626 0  ?   Pain Loc --   ?   Pain Edu? --   ?   Excl. in [place]? --   ? ?No data found. ? ?Updated Vital Signs ?BP (!) 197/71 (BP Location: Left Arm)   Pulse 82   Temp 98.6 ?F (37 ?C) (Oral)   Resp 18   SpO2 97%  ? ?Visual Acuity ?Right Eye Distance:   ?Left Eye Distance:   ?Bilateral Distance:   ? ?Right Eye Near:   ?Left Eye Near:    ?Bilateral Near:    ? ?Physical Exam ?Vitals and nursing note reviewed.  ?Constitutional:   ?   Appearance: Normal appearance.  ?HENT:  ?   Head: Normocephalic.  ?   Right Ear: Tympanic membrane, ear canal and external ear normal.  ?   Left Ear: Tympanic membrane, ear canal and external ear normal.  ?   Nose: Nose normal.  ?   Mouth/Throat:  ?   Mouth: Mucous membranes are moist.  ?Eyes:  ?   General: Vision grossly intact. No visual field deficit. ?   Extraocular Movements: Extraocular movements intact.  ?   Conjunctiva/sclera: Conjunctivae normal.  ?   Pupils: Pupils are equal, round, and reactive to light.  ?   Comments: Redness to bilateral lower lids. No drainage or swelling present.  ?Cardiovascular:  ?   Rate and Rhythm: Normal rate and regular rhythm.  ?   Pulses: Normal pulses.  ?   Heart sounds: Normal heart sounds.  ?Pulmonary:  ?   Effort: Pulmonary effort is normal.  ?   Breath sounds: Normal breath sounds.  ?Musculoskeletal:  ?   Cervical back: Normal range of motion.   ?Skin: ?   Capillary Refill: Capillary refill takes less than 2 seconds.  ?  Neurological:  ?   General: No focal deficit present.  ?   Mental Status: She is alert and oriented to person, place, and time.  ?Psychiatric:     ?   Mood and Affect: Mood normal.     ?   Behavior: Behavior normal.  ? ? ? ?UC Treatments / Results  ?Labs ?(all labs ordered are listed, but only abnormal results are displayed) ?Labs Reviewed - No data to display ? ?EKG ? ? ?Radiology ?No results found. ? ?Procedures ?Procedures (including critical care time) ? ?Medications Ordered in UC ?Medications - No data to display ? ?Initial Impression / Assessment and Plan / UC Course  ?I have reviewed the triage vital signs and the nursing notes. ? ?Pertinent labs & imaging results that were available during my care of the patient were reviewed by me and considered in my medical decision making (see chart for details). ? ?The patient is a 88 year old female who presents with redness to her lower eyelids with intermittent tearing.  Symptoms have been present for the past 2 weeks.  The patient denies any pain, change in vision, loss of vision, or blurred vision.  On exam, she does have moderate redness to the bilateral lower eyelids.  There is no conjunctival swelling or erythema noted.  There is no tearing present.  We will treat the patient with TobraDex in case there is a superficial eye infection and to help with inflammation.  Patient's son was advised that she will need to follow-up with her primary care or ophthalmology if her symptoms do not improve.  Supportive care to include cool compresses to the eyes, avoiding manipulating or rubbing the eyes while symptoms persist, and Tylenol as needed for pain.  Patient and son verbalizes understanding.  Follow-up as needed. ?Final Clinical Impressions(s) / UC Diagnoses  ? ?Final diagnoses:  ?Eyelid inflammation  ? ? ? ?Discharge Instructions   ? ?  ?Use medication as prescribed. ?Try to avoid rubbing or  itching the eye while symptoms persist. ?Cool compresses to the eye to help with redness and inflammation. ?Follow-up with primary care physician if symptoms do not improve. ? ? ? ? ? ?ED Prescriptions   ? ? Medic

## 2021-06-01 ENCOUNTER — Ambulatory Visit (HOSPITAL_COMMUNITY): Payer: PPO

## 2021-06-05 ENCOUNTER — Ambulatory Visit: Payer: PPO | Admitting: Internal Medicine

## 2021-06-11 ENCOUNTER — Ambulatory Visit (HOSPITAL_COMMUNITY): Payer: PPO

## 2021-06-15 ENCOUNTER — Inpatient Hospital Stay (HOSPITAL_COMMUNITY): Payer: PPO | Attending: Oncology

## 2021-06-15 VITALS — BP 205/85 | HR 84 | Temp 97.9°F | Resp 18

## 2021-06-15 DIAGNOSIS — D510 Vitamin B12 deficiency anemia due to intrinsic factor deficiency: Secondary | ICD-10-CM

## 2021-06-15 DIAGNOSIS — E538 Deficiency of other specified B group vitamins: Secondary | ICD-10-CM | POA: Insufficient documentation

## 2021-06-15 DIAGNOSIS — D509 Iron deficiency anemia, unspecified: Secondary | ICD-10-CM

## 2021-06-15 MED ORDER — CYANOCOBALAMIN 1000 MCG/ML IJ SOLN
1000.0000 ug | Freq: Once | INTRAMUSCULAR | Status: AC
  Administered 2021-06-15: 1000 ug via INTRAMUSCULAR
  Filled 2021-06-15: qty 1

## 2021-06-15 NOTE — Progress Notes (Signed)
[name] presents today for injection per the provider's orders.  B12 injection administration without incident; injection site WNL; see MAR for injection details.  Patient tolerated procedure well and without incident.  No questions or complaints noted at this time.   Discharged from clinic ambulatory in stable condition. Alert and oriented x 3. F/U with [facility] as scheduled.

## 2021-06-15 NOTE — Patient Instructions (Signed)
[name]  Discharge Instructions: ?Thank you for choosing [facility] to provide your oncology and hematology care.  ?If you have a lab appointment with the Cancer Center, please come in thru the Main Entrance and check in at the main information desk. ? ?Wear comfortable clothing and clothing appropriate for easy access to any Portacath or PICC line.  ? ?We strive to give you quality time with your provider. You may need to reschedule your appointment if you arrive late (15 or more minutes).  Arriving late affects you and other patients whose appointments are after yours.  Also, if you miss three or more appointments without notifying the office, you may be dismissed from the clinic at the provider?s discretion.    ?  ?For prescription refill requests, have your pharmacy contact our office and allow 72 hours for refills to be completed.   ? ?Today you received B12 injection ?  ? ? ?BELOW ARE SYMPTOMS THAT SHOULD BE REPORTED IMMEDIATELY: ?*FEVER GREATER THAN 100.4 F (38 ?C) OR HIGHER ?*CHILLS OR SWEATING ?*NAUSEA AND VOMITING THAT IS NOT CONTROLLED WITH YOUR NAUSEA MEDICATION ?*UNUSUAL SHORTNESS OF BREATH ?*UNUSUAL BRUISING OR BLEEDING ?*URINARY PROBLEMS (pain or burning when urinating, or frequent urination) ?*BOWEL PROBLEMS (unusual diarrhea, constipation, pain near the anus) ?TENDERNESS IN MOUTH AND THROAT WITH OR WITHOUT PRESENCE OF ULCERS (sore throat, sores in mouth, or a toothache) ?UNUSUAL RASH, SWELLING OR PAIN  ?UNUSUAL VAGINAL DISCHARGE OR ITCHING  ? ?Items with * indicate a potential emergency and should be followed up as soon as possible or go to the Emergency Department if any problems should occur. ? ?Please show the CHEMOTHERAPY ALERT CARD or IMMUNOTHERAPY ALERT CARD at check-in to the Emergency Department and triage nurse. ? ?Should you have questions after your visit or need to cancel or reschedule your appointment, please contact [facility] [phone]   and follow the prompts.  Office hours are 8:00 a.m. to 4:30 p.m. Monday - Friday. Please note that voicemails left after 4:00 p.m. may not be returned until the following business day.  We are closed weekends and major holidays. You have access to a nurse at all times for urgent questions. Please call the main number to the clinic [phone] and follow the prompts. ? ?For any non-urgent questions, you may also contact your provider using MyChart. We now offer e-Visits for anyone 18 and older to request care online for non-urgent symptoms. For details visit [email or web address]. ?  ?Also download the MyChart app! Go to the app store, search "MyChart", open the app, select [facility], and log in with your MyChart username and password. ? ?Due to Covid, a mask is required upon entering the hospital/clinic. If you do not have a mask, one will be given to you upon arrival. For doctor visits, patients may have 1 support person aged 18 or older with them. For treatment visits, patients cannot have anyone with them due to current Covid guidelines and our immunocompromised population.  ?

## 2021-06-20 ENCOUNTER — Encounter: Payer: Self-pay | Admitting: Internal Medicine

## 2021-06-20 ENCOUNTER — Ambulatory Visit (INDEPENDENT_AMBULATORY_CARE_PROVIDER_SITE_OTHER): Payer: PPO | Admitting: Internal Medicine

## 2021-06-20 VITALS — BP 138/82 | HR 82 | Resp 16 | Ht 61.0 in | Wt 98.8 lb

## 2021-06-20 DIAGNOSIS — W19XXXA Unspecified fall, initial encounter: Secondary | ICD-10-CM | POA: Diagnosis not present

## 2021-06-20 DIAGNOSIS — I10 Essential (primary) hypertension: Secondary | ICD-10-CM | POA: Diagnosis not present

## 2021-06-20 DIAGNOSIS — I6523 Occlusion and stenosis of bilateral carotid arteries: Secondary | ICD-10-CM

## 2021-06-20 DIAGNOSIS — E039 Hypothyroidism, unspecified: Secondary | ICD-10-CM

## 2021-06-20 DIAGNOSIS — N1831 Chronic kidney disease, stage 3a: Secondary | ICD-10-CM | POA: Diagnosis not present

## 2021-06-20 MED ORDER — LEVOTHYROXINE SODIUM 25 MCG PO TABS: 25.0000 ug | ORAL_TABLET | Freq: Every day | ORAL | 1 refills | Status: DC

## 2021-06-20 MED ORDER — VALSARTAN 40 MG PO TABS: 40.0000 mg | ORAL_TABLET | Freq: Every day | ORAL | 1 refills | Status: DC

## 2021-06-20 NOTE — Assessment & Plan Note (Signed)
Check BMP Avoid nephrotoxic agents Advised to maintain adequate hydration On ARB 

## 2021-06-20 NOTE — Patient Instructions (Addendum)
Please continue to take medications as prescribed.  Please continue to eat at regular intervals and maintain at least 64 ounces of fluid intake.  You are being scheduled to get Carotid artery Ultrasound.  Please get immediate medical attention if she has fall and loses consciousness.

## 2021-06-20 NOTE — Assessment & Plan Note (Signed)
Due to recent fall, will recheck carotid artery ultrasound

## 2021-06-20 NOTE — Assessment & Plan Note (Signed)
BP Readings from Last 1 Encounters:  06/20/21 138/82   Usually well-controlled with Felodipine and Valsartan Counseled for compliance with the medications Advised DASH diet and moderate exercise/walking as tolerated

## 2021-06-20 NOTE — Progress Notes (Signed)
 Established Patient Office Visit  Subjective:  Patient ID: [name], female    DOB: 02/28/1922  Age: 88 y.o. MRN: [id number]  CC:  Chief Complaint  Patient presents with   Follow-up    4 month follow up HTN and hypothyroidism pt bottom is sore she fell on 06-17-21     HPI [name] is a 88 y.o. female with past medical history of HTN, benign neuroendocrine carcinoid tumor of stomach, iron deficiency anemia, vitamin B12 deficiency and malnutrition who presents for f/u of her chronic medical conditions.  HTN: Her BP has been fluctuating since she has run out of her valsartan.  She denies any headache, chest pain, dyspnea or palpitations.  Fall: She had an unwitnessed fall at home while walking towards her kitchen from her sunroom.  Her son heard a thudding sound and saw her on the floor, and was confused at that time. He gave her water and she was alert later. She had soreness in her back after the fall, which is improved now.  She denies any head injury.  Denies any shaking movements.  Of note, she has run out of one of her antihypertensive.  She has history of carotid artery stenosis as well.  She has also done out of her levothyroxine.  Denies any recent change in weight or appetite.    Past Medical History:  Diagnosis Date   Adult BMI 19-24 kg/sq m 2008 125 lbs   Anemia    Pernicious 2008-HB 12 MCV 119.5; JAN 2012 HB 13.2 MCV 97.6   Carcinoid tumor of stomach 2008   Diverticulosis    GERD (gastroesophageal reflux disease)    Hiatal hernia    HTN (hypertension)    Hyperlipidemia    Hypertension    Phreesia 02/07/2020   Mild memory loss 02/15/2011   Near syncope 04/18/2014   Pernicious anemia    Pernicious anemia 06/14/2010   Wrist fracture, left 2008    Past Surgical History:  Procedure Laterality Date   COLONOSCOPY  2003 LS   DC/[facility] Diverticulosis   EUS  2008 DJ   FNA NO CARCINOID   EUS  JAN 2009   NL EXAM, NO Bx   EUS  MAR 2011 WO   CARCINOID   UPPER  GASTROINTESTINAL ENDOSCOPY  APR 2008 SL[facility]   CARCIONOID   UPPER GASTROINTESTINAL ENDOSCOPY  FEB 2011 [facility]   CARCINOID/mild anemia/large hiatal hernia    Family History  Problem Relation Age of Onset   Cancer Mother    Heart disease Brother    Kidney disease Son    Colon cancer Neg Hx    Colon polyps Neg Hx     Social History   Socioeconomic History   Marital status: Widowed    Spouse name: Not on file   Number of children: 1   Years of education: 12   Highest education level: Not on file  Occupational History   Not on file  Tobacco Use   Smoking status: Never   Smokeless tobacco: Never  Vaping Use   Vaping Use: Never used  Substance and Sexual Activity   Alcohol use: No   Drug use: No   Sexual activity: Not Currently    Birth control/protection: Post-menopausal  Other Topics Concern   Not on file  Social History Narrative   Live with son Ray   Social Determinants of Health   Financial Resource Strain: Low Risk    Difficulty of Paying Living Expenses: Not hard at all     Established Patient Office Visit  Subjective:  Patient ID: [name], female    DOB: 02/28/1922  Age: 88 y.o. MRN: [id number]  CC:  Chief Complaint  Patient presents with   Follow-up    4 month follow up HTN and hypothyroidism pt bottom is sore she fell on 06-17-21     HPI [name] is a 88 y.o. female with past medical history of HTN, benign neuroendocrine carcinoid tumor of stomach, iron deficiency anemia, vitamin B12 deficiency and malnutrition who presents for f/u of her chronic medical conditions.  HTN: Her BP has been fluctuating since she has run out of her valsartan.  She denies any headache, chest pain, dyspnea or palpitations.  Fall: She had an unwitnessed fall at home while walking towards her kitchen from her sunroom.  Her son heard a thudding sound and saw her on the floor, and was confused at that time. He gave her water and she was alert later. She had soreness in her back after the fall, which is improved now.  She denies any head injury.  Denies any shaking movements.  Of note, she has run out of one of her antihypertensive.  She has history of carotid artery stenosis as well.  She has also done out of her levothyroxine.  Denies any recent change in weight or appetite.    Past Medical History:  Diagnosis Date   Adult BMI 19-24 kg/sq m 2008 125 lbs   Anemia    Pernicious 2008-HB 12 MCV 119.5; JAN 2012 HB 13.2 MCV 97.6   Carcinoid tumor of stomach 2008   Diverticulosis    GERD (gastroesophageal reflux disease)    Hiatal hernia    HTN (hypertension)    Hyperlipidemia    Hypertension    Phreesia 02/07/2020   Mild memory loss 02/15/2011   Near syncope 04/18/2014   Pernicious anemia    Pernicious anemia 06/14/2010   Wrist fracture, left 2008    Past Surgical History:  Procedure Laterality Date   COLONOSCOPY  2003 LS   [facility] Diverticulosis   EUS  2008 DJ   FNA NO CARCINOID   EUS  JAN 2009   NL EXAM, NO Bx   EUS  MAR 2011 WO   CARCINOID   UPPER  GASTROINTESTINAL ENDOSCOPY  APR 2008 SL[facility]   CARCIONOID   UPPER GASTROINTESTINAL ENDOSCOPY  FEB 2011 [facility]   CARCINOID/mild anemia/large hiatal hernia    Family History  Problem Relation Age of Onset   Cancer Mother    Heart disease Brother    Kidney disease Son    Colon cancer Neg Hx    Colon polyps Neg Hx     Social History   Socioeconomic History   Marital status: Widowed    Spouse name: Not on file   Number of children: 1   Years of education: 12   Highest education level: Not on file  Occupational History   Not on file  Tobacco Use   Smoking status: Never   Smokeless tobacco: Never  Vaping Use   Vaping Use: Never used  Substance and Sexual Activity   Alcohol use: No   Drug use: No   Sexual activity: Not Currently    Birth control/protection: Post-menopausal  Other Topics Concern   Not on file  Social History Narrative   Live with son Ray   Social Determinants of Health   Financial Resource Strain: Low Risk    Difficulty of Paying Living Expenses: Not hard at all     Established Patient Office Visit  Subjective:  Patient ID: [name], female    DOB: 02/28/1922  Age: 88 y.o. MRN: [id number]  CC:  Chief Complaint  Patient presents with   Follow-up    4 month follow up HTN and hypothyroidism pt bottom is sore she fell on 06-17-21     HPI [name] is a 88 y.o. female with past medical history of HTN, benign neuroendocrine carcinoid tumor of stomach, iron deficiency anemia, vitamin B12 deficiency and malnutrition who presents for f/u of her chronic medical conditions.  HTN: Her BP has been fluctuating since she has run out of her valsartan.  She denies any headache, chest pain, dyspnea or palpitations.  Fall: She had an unwitnessed fall at home while walking towards her kitchen from her sunroom.  Her son heard a thudding sound and saw her on the floor, and was confused at that time. He gave her water and she was alert later. She had soreness in her back after the fall, which is improved now.  She denies any head injury.  Denies any shaking movements.  Of note, she has run out of one of her antihypertensive.  She has history of carotid artery stenosis as well.  She has also done out of her levothyroxine.  Denies any recent change in weight or appetite.    Past Medical History:  Diagnosis Date   Adult BMI 19-24 kg/sq m 2008 125 lbs   Anemia    Pernicious 2008-HB 12 MCV 119.5; JAN 2012 HB 13.2 MCV 97.6   Carcinoid tumor of stomach 2008   Diverticulosis    GERD (gastroesophageal reflux disease)    Hiatal hernia    HTN (hypertension)    Hyperlipidemia    Hypertension    Phreesia 02/07/2020   Mild memory loss 02/15/2011   Near syncope 04/18/2014   Pernicious anemia    Pernicious anemia 06/14/2010   Wrist fracture, left 2008    Past Surgical History:  Procedure Laterality Date   COLONOSCOPY  2003 LS   [facility] Diverticulosis   EUS  2008 DJ   FNA NO CARCINOID   EUS  JAN 2009   NL EXAM, NO Bx   EUS  MAR 2011 WO   CARCINOID   UPPER  GASTROINTESTINAL ENDOSCOPY  APR 2008 [facility]   CARCIONOID   UPPER GASTROINTESTINAL ENDOSCOPY  FEB 2011 [facility]   CARCINOID/mild anemia/large hiatal hernia    Family History  Problem Relation Age of Onset   Cancer Mother    Heart disease Brother    Kidney disease Son    Colon cancer Neg Hx    Colon polyps Neg Hx     Social History   Socioeconomic History   Marital status: Widowed    Spouse name: Not on file   Number of children: 1   Years of education: 12   Highest education level: Not on file  Occupational History   Not on file  Tobacco Use   Smoking status: Never   Smokeless tobacco: Never  Vaping Use   Vaping Use: Never used  Substance and Sexual Activity   Alcohol use: No   Drug use: No   Sexual activity: Not Currently    Birth control/protection: Post-menopausal  Other Topics Concern   Not on file  Social History Narrative   Live with son Ray   Social Determinants of Health   Financial Resource Strain: Low Risk    Difficulty of Paying Living Expenses: Not hard at all     Established Patient Office Visit  Subjective:  Patient ID: [name], female    DOB: 02/28/1922  Age: 88 y.o. MRN: [id number]  CC:  Chief Complaint  Patient presents with   Follow-up    4 month follow up HTN and hypothyroidism pt bottom is sore she fell on 06-17-21     HPI [name] is a 88 y.o. female with past medical history of HTN, benign neuroendocrine carcinoid tumor of stomach, iron deficiency anemia, vitamin B12 deficiency and malnutrition who presents for f/u of her chronic medical conditions.  HTN: Her BP has been fluctuating since she has run out of her valsartan.  She denies any headache, chest pain, dyspnea or palpitations.  Fall: She had an unwitnessed fall at home while walking towards her kitchen from her sunroom.  Her son heard a thudding sound and saw her on the floor, and was confused at that time. He gave her water and she was alert later. She had soreness in her back after the fall, which is improved now.  She denies any head injury.  Denies any shaking movements.  Of note, she has run out of one of her antihypertensive.  She has history of carotid artery stenosis as well.  She has also done out of her levothyroxine.  Denies any recent change in weight or appetite.    Past Medical History:  Diagnosis Date   Adult BMI 19-24 kg/sq m 2008 125 lbs   Anemia    Pernicious 2008-HB 12 MCV 119.5; JAN 2012 HB 13.2 MCV 97.6   Carcinoid tumor of stomach 2008   Diverticulosis    GERD (gastroesophageal reflux disease)    Hiatal hernia    HTN (hypertension)    Hyperlipidemia    Hypertension    Phreesia 02/07/2020   Mild memory loss 02/15/2011   Near syncope 04/18/2014   Pernicious anemia    Pernicious anemia 06/14/2010   Wrist fracture, left 2008    Past Surgical History:  Procedure Laterality Date   COLONOSCOPY  2003 LS   [facility] Diverticulosis   EUS  2008 DJ   FNA NO CARCINOID   EUS  JAN 2009   NL EXAM, NO Bx   EUS  MAR 2011 WO   CARCINOID   UPPER  GASTROINTESTINAL ENDOSCOPY  APR 2008 SL[facility]   CARCIONOID   UPPER GASTROINTESTINAL ENDOSCOPY  FEB 2011 SL[facility]   CARCINOID/mild anemia/large hiatal hernia    Family History  Problem Relation Age of Onset   Cancer Mother    Heart disease Brother    Kidney disease Son    Colon cancer Neg Hx    Colon polyps Neg Hx     Social History   Socioeconomic History   Marital status: Widowed    Spouse name: Not on file   Number of children: 1   Years of education: 12   Highest education level: Not on file  Occupational History   Not on file  Tobacco Use   Smoking status: Never   Smokeless tobacco: Never  Vaping Use   Vaping Use: Never used  Substance and Sexual Activity   Alcohol use: No   Drug use: No   Sexual activity: Not Currently    Birth control/protection: Post-menopausal  Other Topics Concern   Not on file  Social History Narrative   Live with son Ray   Social Determinants of Health   Financial Resource Strain: Low Risk    Difficulty of Paying Living Expenses: Not hard at all  

## 2021-06-20 NOTE — Assessment & Plan Note (Signed)
Lab Results  Component Value Date   TSH 3.880 07/19/2020   On Levothyroxine 25 mcg QD, needs to stay compliant Check TSH and free T4 

## 2021-06-20 NOTE — Assessment & Plan Note (Addendum)
Has had vasovagal syncope in the past Needs to maintain adequate hydration Check carotid artery ultrasound Needs to stay compliant with her medication regimen, her son is going to help her with medications

## 2021-06-21 ENCOUNTER — Other Ambulatory Visit (HOSPITAL_COMMUNITY): Payer: PPO

## 2021-06-21 ENCOUNTER — Emergency Department (HOSPITAL_COMMUNITY): Payer: PPO

## 2021-06-21 ENCOUNTER — Observation Stay (HOSPITAL_COMMUNITY): Payer: PPO

## 2021-06-21 ENCOUNTER — Telehealth: Payer: Self-pay | Admitting: Family Medicine

## 2021-06-21 ENCOUNTER — Encounter (HOSPITAL_COMMUNITY): Payer: Self-pay | Admitting: *Deleted

## 2021-06-21 ENCOUNTER — Other Ambulatory Visit: Payer: Self-pay

## 2021-06-21 ENCOUNTER — Observation Stay (HOSPITAL_COMMUNITY)
Admission: EM | Admit: 2021-06-21 | Discharge: 2021-06-22 | Disposition: A | Discharge status: Home Health | Payer: PPO | Attending: Family Medicine | Admitting: Family Medicine

## 2021-06-21 DIAGNOSIS — I129 Hypertensive chronic kidney disease with stage 1 through stage 4 chronic kidney disease, or unspecified chronic kidney disease: Secondary | ICD-10-CM | POA: Insufficient documentation

## 2021-06-21 DIAGNOSIS — R569 Unspecified convulsions: Secondary | ICD-10-CM

## 2021-06-21 DIAGNOSIS — I639 Cerebral infarction, unspecified: Secondary | ICD-10-CM | POA: Diagnosis not present

## 2021-06-21 DIAGNOSIS — Z79899 Other long term (current) drug therapy: Secondary | ICD-10-CM | POA: Diagnosis not present

## 2021-06-21 DIAGNOSIS — I6523 Occlusion and stenosis of bilateral carotid arteries: Secondary | ICD-10-CM | POA: Diagnosis not present

## 2021-06-21 DIAGNOSIS — R4182 Altered mental status, unspecified: Secondary | ICD-10-CM | POA: Diagnosis not present

## 2021-06-21 DIAGNOSIS — E039 Hypothyroidism, unspecified: Secondary | ICD-10-CM | POA: Diagnosis not present

## 2021-06-21 DIAGNOSIS — I672 Cerebral atherosclerosis: Secondary | ICD-10-CM | POA: Diagnosis not present

## 2021-06-21 DIAGNOSIS — I10 Essential (primary) hypertension: Secondary | ICD-10-CM | POA: Diagnosis present

## 2021-06-21 DIAGNOSIS — G301 Alzheimer's disease with late onset: Secondary | ICD-10-CM | POA: Insufficient documentation

## 2021-06-21 DIAGNOSIS — D509 Iron deficiency anemia, unspecified: Secondary | ICD-10-CM | POA: Diagnosis not present

## 2021-06-21 DIAGNOSIS — R531 Weakness: Secondary | ICD-10-CM | POA: Diagnosis not present

## 2021-06-21 DIAGNOSIS — I779 Disorder of arteries and arterioles, unspecified: Secondary | ICD-10-CM | POA: Diagnosis present

## 2021-06-21 DIAGNOSIS — K449 Diaphragmatic hernia without obstruction or gangrene: Secondary | ICD-10-CM | POA: Diagnosis not present

## 2021-06-21 DIAGNOSIS — R2689 Other abnormalities of gait and mobility: Secondary | ICD-10-CM | POA: Diagnosis not present

## 2021-06-21 DIAGNOSIS — F0390 Unspecified dementia without behavioral disturbance: Secondary | ICD-10-CM | POA: Diagnosis present

## 2021-06-21 DIAGNOSIS — R4189 Other symptoms and signs involving cognitive functions and awareness: Secondary | ICD-10-CM | POA: Diagnosis not present

## 2021-06-21 DIAGNOSIS — N1831 Chronic kidney disease, stage 3a: Secondary | ICD-10-CM | POA: Diagnosis not present

## 2021-06-21 DIAGNOSIS — Z20822 Contact with and (suspected) exposure to covid-19: Secondary | ICD-10-CM | POA: Insufficient documentation

## 2021-06-21 DIAGNOSIS — R55 Syncope and collapse: Secondary | ICD-10-CM | POA: Diagnosis not present

## 2021-06-21 DIAGNOSIS — F0280 Dementia in other diseases classified elsewhere without behavioral disturbance: Secondary | ICD-10-CM | POA: Diagnosis present

## 2021-06-21 LAB — CBC
HCT: 41.1 % (ref 36.0–46.0)
Hemoglobin: 12.8 g/dL (ref 12.0–15.0)
MCH: 31.7 pg (ref 26.0–34.0)
MCHC: 31.1 g/dL (ref 30.0–36.0)
MCV: 101.7 fL — ABNORMAL HIGH (ref 80.0–100.0)
Platelets: 111 10*3/uL — ABNORMAL LOW (ref 150–400)
RBC: 4.04 MIL/uL (ref 3.87–5.11)
RDW: 11.9 % (ref 11.5–15.5)
WBC: 6.9 10*3/uL (ref 4.0–10.5)
nRBC: 0 % (ref 0.0–0.2)

## 2021-06-21 LAB — RAPID URINE DRUG SCREEN, HOSP PERFORMED
Amphetamines: NOT DETECTED
Barbiturates: NOT DETECTED
Benzodiazepines: NOT DETECTED
Cocaine: NOT DETECTED
Opiates: NOT DETECTED
Tetrahydrocannabinol: NOT DETECTED

## 2021-06-21 LAB — I-STAT CHEM 8, ED
BUN: 20 mg/dL (ref 8–23)
Calcium, Ion: 1.19 mmol/L (ref 1.15–1.40)
Chloride: 105 mmol/L (ref 98–111)
Creatinine, Ser: 1.3 mg/dL — ABNORMAL HIGH (ref 0.44–1.00)
Glucose, Bld: 117 mg/dL — ABNORMAL HIGH (ref 70–99)
HCT: 40 % (ref 36.0–46.0)
Hemoglobin: 13.6 g/dL (ref 12.0–15.0)
Potassium: 3.7 mmol/L (ref 3.5–5.1)
Sodium: 141 mmol/L (ref 135–145)
TCO2: 24 mmol/L (ref 22–32)

## 2021-06-21 LAB — CBG MONITORING, ED: Glucose-Capillary: 102 mg/dL — ABNORMAL HIGH (ref 70–99)

## 2021-06-21 LAB — TROPONIN I (HIGH SENSITIVITY)
Troponin I (High Sensitivity): 15 ng/L (ref <18)
Troponin I (High Sensitivity): 17 ng/L (ref <18)

## 2021-06-21 LAB — DIFFERENTIAL
Abs Immature Granulocytes: 0.03 10*3/uL (ref 0.00–0.07)
Basophils Absolute: 0 10*3/uL (ref 0.0–0.1)
Basophils Relative: 0 %
Eosinophils Absolute: 0 10*3/uL (ref 0.0–0.5)
Eosinophils Relative: 0 %
Immature Granulocytes: 0 %
Lymphocytes Relative: 13 %
Lymphs Abs: 0.9 10*3/uL (ref 0.7–4.0)
Monocytes Absolute: 0.5 10*3/uL (ref 0.1–1.0)
Monocytes Relative: 7 %
Neutro Abs: 5.4 10*3/uL (ref 1.7–7.7)
Neutrophils Relative %: 80 %

## 2021-06-21 LAB — COMPREHENSIVE METABOLIC PANEL
ALT: 12 U/L (ref 0–44)
AST: 24 U/L (ref 15–41)
Albumin: 3.8 g/dL (ref 3.5–5.0)
Alkaline Phosphatase: 83 U/L (ref 38–126)
Anion gap: 5 (ref 5–15)
BUN: 21 mg/dL (ref 8–23)
CO2: 25 mmol/L (ref 22–32)
Calcium: 8.8 mg/dL — ABNORMAL LOW (ref 8.9–10.3)
Chloride: 110 mmol/L (ref 98–111)
Creatinine, Ser: 1.2 mg/dL — ABNORMAL HIGH (ref 0.44–1.00)
GFR, Estimated: 41 mL/min — ABNORMAL LOW (ref >60)
Glucose, Bld: 116 mg/dL — ABNORMAL HIGH (ref 70–99)
Potassium: 3.6 mmol/L (ref 3.5–5.1)
Sodium: 140 mmol/L (ref 135–145)
Total Bilirubin: 0.7 mg/dL (ref 0.3–1.2)
Total Protein: 7.2 g/dL (ref 6.5–8.1)

## 2021-06-21 LAB — URINALYSIS, ROUTINE W REFLEX MICROSCOPIC
Bilirubin Urine: NEGATIVE
Glucose, UA: NEGATIVE mg/dL
Ketones, ur: NEGATIVE mg/dL
Leukocytes,Ua: NEGATIVE
Nitrite: NEGATIVE
Protein, ur: 100 mg/dL — AB
Specific Gravity, Urine: 1.013 (ref 1.005–1.030)
pH: 6 (ref 5.0–8.0)

## 2021-06-21 LAB — APTT: aPTT: 29 seconds (ref 24–36)

## 2021-06-21 LAB — PROTIME-INR
INR: 1.1 (ref 0.8–1.2)
Prothrombin Time: 13.9 seconds (ref 11.4–15.2)

## 2021-06-21 LAB — RESP PANEL BY RT-PCR (FLU A&B, COVID) ARPGX2
Influenza A by PCR: NEGATIVE
Influenza B by PCR: NEGATIVE
SARS Coronavirus 2 by RT PCR: NEGATIVE

## 2021-06-21 LAB — ETHANOL: Alcohol, Ethyl (B): <10 mg/dL (ref <10)

## 2021-06-21 MED ORDER — SODIUM CHLORIDE 0.9 % IV SOLN: INTRAVENOUS | Status: DC

## 2021-06-21 MED ORDER — POLYETHYLENE GLYCOL 3350 17 G PO PACK: 17.0000 g | PACK | Freq: Every day | ORAL | Status: DC | PRN

## 2021-06-21 MED ORDER — ONDANSETRON HCL 4 MG PO TABS: 4.0000 mg | ORAL_TABLET | Freq: Four times a day (QID) | ORAL | Status: DC | PRN

## 2021-06-21 MED ORDER — SODIUM CHLORIDE 0.9% FLUSH
3.0000 mL | Freq: Two times a day (BID) | INTRAVENOUS | Status: DC
  Administered 2021-06-21 – 2021-06-22 (×2): 3 mL via INTRAVENOUS

## 2021-06-21 MED ORDER — LEVOTHYROXINE SODIUM 25 MCG PO TABS
25.0000 ug | ORAL_TABLET | Freq: Every day | ORAL | Status: DC
  Administered 2021-06-22: 25 ug via ORAL
  Filled 2021-06-21: qty 1

## 2021-06-21 MED ORDER — SODIUM CHLORIDE 0.9 % IV SOLN: 250.0000 mL | INTRAVENOUS | Status: DC | PRN

## 2021-06-21 MED ORDER — HEPARIN SODIUM (PORCINE) 5000 UNIT/ML IJ SOLN
5000.0000 [IU] | Freq: Three times a day (TID) | INTRAMUSCULAR | Status: DC
  Administered 2021-06-21 – 2021-06-22 (×2): 5000 [IU] via SUBCUTANEOUS
  Filled 2021-06-21 (×2): qty 1

## 2021-06-21 MED ORDER — SODIUM CHLORIDE 0.9% FLUSH: 3.0000 mL | INTRAVENOUS | Status: DC | PRN

## 2021-06-21 MED ORDER — SODIUM CHLORIDE 0.9 % IV SOLN
1.0000 g | INTRAVENOUS | Status: DC
  Administered 2021-06-21: 1 g via INTRAVENOUS
  Filled 2021-06-21: qty 10

## 2021-06-21 MED ORDER — ACETAMINOPHEN 325 MG PO TABS: 650.0000 mg | ORAL_TABLET | Freq: Four times a day (QID) | ORAL | Status: DC | PRN

## 2021-06-21 MED ORDER — FELODIPINE ER 5 MG PO TB24
5.0000 mg | ORAL_TABLET | Freq: Every day | ORAL | Status: DC
  Administered 2021-06-22: 5 mg via ORAL
  Filled 2021-06-21 (×4): qty 1

## 2021-06-21 MED ORDER — SIMVASTATIN 20 MG PO TABS
40.0000 mg | ORAL_TABLET | Freq: Every day | ORAL | Status: DC
  Administered 2021-06-22: 40 mg via ORAL
  Filled 2021-06-21: qty 2

## 2021-06-21 MED ORDER — TRAZODONE HCL 50 MG PO TABS: 50.0000 mg | ORAL_TABLET | Freq: Every evening | ORAL | Status: DC | PRN

## 2021-06-21 MED ORDER — ONDANSETRON HCL 4 MG/2ML IJ SOLN: 4.0000 mg | Freq: Four times a day (QID) | INTRAMUSCULAR | Status: DC | PRN

## 2021-06-21 MED ORDER — IOHEXOL 350 MG/ML SOLN
60.0000 mL | Freq: Once | INTRAVENOUS | Status: AC | PRN
  Administered 2021-06-21: 60 mL via INTRAVENOUS

## 2021-06-21 MED ORDER — ACETAMINOPHEN 650 MG RE SUPP: 650.0000 mg | Freq: Four times a day (QID) | RECTAL | Status: DC | PRN

## 2021-06-21 MED ORDER — BISACODYL 10 MG RE SUPP: 10.0000 mg | Freq: Every day | RECTAL | Status: DC | PRN

## 2021-06-21 MED ORDER — DONEPEZIL HCL 5 MG PO TABS
5.0000 mg | ORAL_TABLET | Freq: Every day | ORAL | Status: DC
  Administered 2021-06-21: 5 mg via ORAL
  Filled 2021-06-21: qty 1

## 2021-06-21 NOTE — Progress Notes (Signed)
1314 call time 1316 beeper time 1324 exam started 1325 exam finished 1325 images sent to [facility] exam completed in epic 1328 [facility] radiology called

## 2021-06-21 NOTE — Progress Notes (Signed)
EEG completed, results pending. 

## 2021-06-21 NOTE — ED Triage Notes (Signed)
Pt brought in by son Son states they were both sitting in car and he was working on computer when he looked over he found pt to be slumped over and white foam coming from the mouth; pt was not alert  Son drove to hospital and pt assisted onto stretcher; pt is alert to name and had an incontinence of urine;  Son states pt is still not at her normal baseline

## 2021-06-21 NOTE — ED Notes (Signed)
EEG at bedside.

## 2021-06-21 NOTE — ED Notes (Signed)
Patient transported to CT 

## 2021-06-21 NOTE — H&P (Signed)
Patient Demographics:    [name], is a 88 y.o. female  MRN: [id number]   DOB - 24-Aug-1922  Admit Date - 06/21/2021  Outpatient Primary MD for the patient is [name], MD   Assessment & Plan:   Assessment and Plan:   1) episode of sudden unresponsiveness--seizures versus stroke versus syncope -EEG pending (episode was associated with foaming from the mouth as well as bladder and bowel incontinence) -Patient with at least 5 previous episodes of unresponsiveness in the past 4years raising concerns about possible seizure disorder -CT head without acute stroke -Carotid artery Dopplers with 70 to 99% stenosis of the proximal right internal carotid artery -Severe (70-99%) stenosis proximal right internal carotid artery secondary to bulky heterogeneous atherosclerotic plaque. Plaque burden is largely in the carotid bifurcation and extending into the internal and external carotid arteries. -CTA head and neck pending -Possible syncope, admit to telemetry monitored unit, watch for arrhythmias, nd also to evaluate EF and to rule out segmental/Regional wall motion abnormalities -Neurology consult appreciated -Get PT eval  2) CKD 3 A - renally adjust medications, avoid nephrotoxic agents / dehydration  / hypotension  3) hypothyroidism--continue levothyroxine  4) possible UTI--Rocephin pending culture data  Disposition/Need for in-Hospital Stay- patient unable to be discharged at this time due to possible seizures versus stroke versus syncope requiring further investigations and hydration  Dispo: The patient is from: Home              Anticipated d/c is to: Home              Anticipated d/c date is: 1 day              Patient currently is not medically stable to d/c. Barriers: Not Clinically Stable-   With  History of - Reviewed by me  Past Medical History:  Diagnosis Date   Adult BMI 19-24 kg/sq m 2008 125 lbs   Anemia    Pernicious 2008-HB 12 MCV 119.5; JAN 2012 HB 13.2 MCV 97.6   Carcinoid tumor of stomach 2008   Diverticulosis    GERD (gastroesophageal reflux disease)    Hiatal hernia    HTN (hypertension)    Hyperlipidemia    Hypertension    Phreesia 02/07/2020   Mild memory loss 02/15/2011   Near syncope 04/18/2014   Pernicious anemia    Pernicious anemia 06/14/2010   Wrist fracture, left 2008      Past Surgical History:  Procedure Laterality Date   COLONOSCOPY  2003 LS   DC/[place] Diverticulosis   EUS  2008 DJ   FNA NO CARCINOID   EUS  JAN 2009   NL EXAM, NO Bx   EUS  [facility] 2011 WO   CARCINOID   UPPER GASTROINTESTINAL ENDOSCOPY  APR 2008 [facility]   CARCIONOID   UPPER GASTROINTESTINAL ENDOSCOPY  FEB 2011 [facility]   CARCINOID/mild anemia/large hiatal hernia      Chief Complaint  Patient presents  with   Altered Mental Status      HPI:    [name]  is a 88 y.o. female past medical history of HTN, benign neuroendocrine carcinoid tumor of stomach, iron deficiency anemia, vitamin B12 deficiency and malnutrition who was actually seen by PCP Dr. Posey Pronto on 06/10/2021 after an unwitnessed fall while patient was walking towards the kitchen from hands on room - -Patient presents to the ED today with her son -Son states they were both sitting in car and he was working on computer when he looked over he found pt to be slumped over and white foam coming from the mouth; pt was not alert  Son drove to hospital and pt assisted onto stretcher; pt is alert to name and had an incontinence of urine;  Son states pt is still not at her normal baseline -Code stroke and neurology consult in the ED was activated -CT head without acute findings -Carotid artery Dopplers with severe stenosis -Chest x-ray without acute finding Troponin is not elevated UDS negative CBC with white count of 6.9 hemoglobin  of 12.8 and platelets of 111 -Creatinine 1.2 which is similar to prior baseline -LFTs are not elevated -EKG sinus rhythm with LVH -UA with suggestive of possible UTI    Review of systems:    In addition to the HPI above,   A full Review of  Systems was done, all other systems reviewed are negative except as noted above in HPI , .    Social History:  Reviewed by me    Social History   Tobacco Use   Smoking status: Never   Smokeless tobacco: Never  Substance Use Topics   Alcohol use: No      Family History :  Reviewed by me    Family History  Problem Relation Age of Onset   Cancer Mother    Heart disease Brother    Kidney disease Son    Colon cancer Neg Hx    Colon polyps Neg Hx      Home Medications:   Prior to Admission medications   Medication Sig Start Date End Date Taking? Authorizing Provider  acetaminophen (TYLENOL) 500 MG tablet Take 500 mg by mouth every 6 (six) hours as needed. For pain   Yes [provider]  donepezil (ARICEPT) 5 MG tablet TAKE 1 TABLET BY MOUTH AT BEDTIME 04/23/21  Yes [name], MD  felodipine (PLENDIL) 5 MG 24 hr tablet Take 1 tablet (5 mg total) by mouth daily. 02/05/21  Yes [name], MD  levothyroxine (SYNTHROID) 25 MCG tablet Take 1 tablet (25 mcg total) by mouth daily before breakfast. 06/20/21  Yes [name], MD  simvastatin (ZOCOR) 40 MG tablet Take 1 tablet (40 mg total) by mouth daily. 04/24/21  Yes [name], MD  valsartan (DIOVAN) 40 MG tablet Take 1 tablet (40 mg total) by mouth daily. 06/20/21  Yes [name], MD     Allergies:     Allergies  Allergen Reactions   Codeine      Physical Exam:   Vitals  Blood pressure (!) 149/72, pulse 95, temperature 99.6 F (37.6 C), temperature source Rectal, resp. rate 17, SpO2 99 %.  Physical Examination: General appearance - alert,  in no distress and  Mental status - alert, oriented to person, place, and time--underlying/baseline cognitive  and memory deficits Eyes - sclera anicteric Neck - supple, no JVD elevation , Chest - clear  to auscultation bilaterally, symmetrical air movement,  Heart - S1 and S2 normal, regular  Abdomen - soft, nontender, nondistended, +BS Neurological - screening mental status exam normal, neck supple without rigidity, cranial nerves II through XII intact, DTR's normal and symmetric Extremities - no pedal edema noted, intact peripheral pulses  Skin - warm, dry     Data Review:    CBC Recent Labs  Lab 06/21/21 1319 06/21/21 1338  WBC 6.9  --   HGB 12.8 13.6  HCT 41.1 40.0  PLT 111*  --   MCV 101.7*  --   MCH 31.7  --   MCHC 31.1  --   RDW 11.9  --   LYMPHSABS 0.9  --   MONOABS 0.5  --   EOSABS 0.0  --   BASOSABS 0.0  --    ------------------------------------------------------------------------------------------------------------------  Chemistries  Recent Labs  Lab 06/21/21 1319 06/21/21 1338  NA 140 141  K 3.6 3.7  CL 110 105  CO2 25  --   GLUCOSE 116* 117*  BUN 21 20  CREATININE 1.20* 1.30*  CALCIUM 8.8*  --   AST 24  --   ALT 12  --   ALKPHOS 83  --   BILITOT 0.7  --    ------------------------------------------------------------------------------------------------------------------ estimated creatinine clearance is 17.5 mL/min (A) (by C-G formula based on SCr of 1.3 mg/dL (H)). ------------------------------------------------------------------------------------------------------------------ No results for input(s): TSH, T4TOTAL, T3FREE, THYROIDAB in the last 72 hours.  Invalid input(s): FREET3   Coagulation profile Recent Labs  Lab 06/21/21 1319  INR 1.1   ------------------------------------------------------------------------------------------------------------------- No results for input(s): DDIMER in the last 72 hours. -------------------------------------------------------------------------------------------------------------------  Cardiac  Enzymes No results for input(s): CKMB, TROPONINI, MYOGLOBIN in the last 168 hours.  Invalid input(s): CK ------------------------------------------------------------------------------------------------------------------ No results found for: BNP   ---------------------------------------------------------------------------------------------------------------  Urinalysis    Component Value Date/Time   COLORURINE YELLOW 06/21/2021 [place] 06/21/2021 1357   LABSPEC 1.013 06/21/2021 1357   PHURINE 6.0 06/21/2021 1357   GLUCOSEU NEGATIVE 06/21/2021 1357   HGBUR SMALL (A) 06/21/2021 1357   BILIRUBINUR NEGATIVE 06/21/2021 1357   KETONESUR NEGATIVE 06/21/2021 1357   PROTEINUR 100 (A) 06/21/2021 1357   UROBILINOGEN 0.2 04/18/2014 1420   NITRITE NEGATIVE 06/21/2021 1357   LEUKOCYTESUR NEGATIVE 06/21/2021 1357    ----------------------------------------------------------------------------------------------------------------   Imaging Results:    US Carotid Bilateral  Result Date: 06/21/2021 CLINICAL DATA:  Syncope EXAM: BILATERAL CAROTID DUPLEX ULTRASOUND TECHNIQUE: Pearline Cables scale imaging, color Doppler and duplex ultrasound were performed of bilateral carotid and vertebral arteries in the neck. COMPARISON:  None Available. FINDINGS: Criteria: Quantification of carotid stenosis is based on velocity parameters that correlate the residual internal carotid diameter with NASCET-based stenosis levels, using the diameter of the distal internal carotid lumen as the denominator for stenosis measurement. The following velocity measurements were obtained: RIGHT ICA: 246/18 cm/sec CCA: 656/8 cm/sec SYSTOLIC ICA/CCA RATIO:  2.3 ECA:  303 cm/sec LEFT ICA: 64/10 cm/sec CCA: 127/5 cm/sec SYSTOLIC ICA/CCA RATIO:  0.5 ECA:  127 cm/sec RIGHT CAROTID ARTERY: Extensive atherosclerotic plaque in the carotid bifurcation extending into the proximal internal and external carotid arteries. By peak systolic  velocity criteria, the estimated stenosis in the internal carotid artery is greater than 70%. Similarly, there is an estimated greater than 60% diameter stenosis in the origin of the external carotid artery. RIGHT VERTEBRAL ARTERY:  Patent with normal antegrade flow. LEFT CAROTID ARTERY: Mild heterogeneous atherosclerotic plaque in the proximal internal carotid artery. By peak systolic velocity criteria, the estimated stenosis is less than 50%.  LEFT VERTEBRAL ARTERY:  Patent with normal antegrade flow. IMPRESSION: 1. Severe (70-99%) stenosis proximal right internal carotid artery secondary to bulky heterogeneous atherosclerotic plaque. Plaque burden is largely in the carotid bifurcation and extending into the internal and external carotid arteries. 2. Mild (1-49%) stenosis proximal left internal carotid artery secondary to heterogenous atherosclerotic plaque. 3. Vertebral arteries are patent with normal antegrade flow. Signed, Criselda Peaches, MD, [place] [facility] Radiology Electronically Signed   By: Jacqulynn Cadet M.D.   On: 06/21/2021 16:02   DG Chest Port 1 View  Result Date: 06/21/2021 CLINICAL DATA:  Weakness.  Altered mental status.  Code stroke. EXAM: PORTABLE CHEST 1 VIEW COMPARISON:  09/24/2019 FINDINGS: Mild cardiomegaly. Large hiatal hernia as seen previously. Aortic atherosclerotic calcification. The lungs show mild scarring but no infiltrate, collapse or effusion. No pulmonary edema. Ordinary degenerative changes affect the spine and shoulders. IMPRESSION: No active disease. Large hiatal hernia. Aortic atherosclerosis. Mild pulmonary scarring. Electronically Signed   By: Nelson Chimes M.D.   On: 06/21/2021 13:36   CT HEAD CODE STROKE WO CONTRAST  Result Date: 06/21/2021 CLINICAL DATA:  Code stroke. There deficit, acute, stroke suspected. EXAM: CT HEAD WITHOUT CONTRAST TECHNIQUE: Contiguous axial images were obtained from the base of the skull  through the vertex without intravenous contrast. RADIATION DOSE REDUCTION: This exam was performed according to the departmental dose-optimization program which includes automated exposure control, adjustment of the mA and/or kV according to patient size and/or use of iterative reconstruction technique. COMPARISON:  09/24/2019 FINDINGS: Brain: Age related volume loss. Mild to moderate chronic small-vessel ischemic changes of the hemispheric white matter. No sign of acute infarction, mass lesion, hemorrhage, hydrocephalus or extra-axial collection. Vascular: There is atherosclerotic calcification of the major vessels at the base of the brain. Skull: Negative Sinuses/Orbits: Clear/normal Other: None ASPECTS ([place] Stroke Program Early CT Score) - Ganglionic level infarction (caudate, lentiform nuclei, internal capsule, insula, M1-M3 cortex): 7 - Supraganglionic infarction (M4-M6 cortex): 3 Total score (0-10 with 10 being normal): 10 IMPRESSION: 1. No acute CT finding. Age related volume loss. Chronic small-vessel ischemic changes of the cerebral hemispheric white matter. 2. ASPECTS is 10. 3. These results were called by telephone at the time of interpretation on 06/21/2021 at 1:25 pm to provider JOSEPH ZAMMIT , who verbally acknowledged these results. Electronically Signed   By: Nelson Chimes M.D.   On: 06/21/2021 13:32    Radiological Exams on Admission: US Carotid Bilateral  Result Date: 06/21/2021 CLINICAL DATA:  Syncope EXAM: BILATERAL CAROTID DUPLEX ULTRASOUND TECHNIQUE: Pearline Cables scale imaging, color Doppler and duplex ultrasound were performed of bilateral carotid and vertebral arteries in the neck. COMPARISON:  None Available. FINDINGS: Criteria: Quantification of carotid stenosis is based on velocity parameters that correlate the residual internal carotid diameter with NASCET-based stenosis levels, using the diameter of the distal internal carotid lumen as the denominator for stenosis measurement. The following  velocity measurements were obtained: RIGHT ICA: 246/18 cm/sec CCA: 161/0 cm/sec SYSTOLIC ICA/CCA RATIO:  2.3 ECA:  303 cm/sec LEFT ICA: 64/10 cm/sec CCA: 960/4 cm/sec SYSTOLIC ICA/CCA RATIO:  0.5 ECA:  127 cm/sec RIGHT CAROTID ARTERY: Extensive atherosclerotic plaque in the carotid bifurcation extending into the proximal internal and external carotid arteries. By peak systolic velocity criteria, the estimated stenosis in the internal carotid artery is greater than 70%. Similarly, there is an estimated greater than 60% diameter stenosis in the origin of the external carotid artery. RIGHT VERTEBRAL ARTERY:  Patent with normal antegrade  flow. LEFT CAROTID ARTERY: Mild heterogeneous atherosclerotic plaque in the proximal internal carotid artery. By peak systolic velocity criteria, the estimated stenosis is less than 50%. LEFT VERTEBRAL ARTERY:  Patent with normal antegrade flow. IMPRESSION: 1. Severe (70-99%) stenosis proximal right internal carotid artery secondary to bulky heterogeneous atherosclerotic plaque. Plaque burden is largely in the carotid bifurcation and extending into the internal and external carotid arteries. 2. Mild (1-49%) stenosis proximal left internal carotid artery secondary to heterogenous atherosclerotic plaque. 3. Vertebral arteries are patent with normal antegrade flow. Signed, Criselda Peaches, MD, [place] [facility] Radiology Electronically Signed   By: Jacqulynn Cadet M.D.   On: 06/21/2021 16:02   DG Chest Port 1 View  Result Date: 06/21/2021 CLINICAL DATA:  Weakness.  Altered mental status.  Code stroke. EXAM: PORTABLE CHEST 1 VIEW COMPARISON:  09/24/2019 FINDINGS: Mild cardiomegaly. Large hiatal hernia as seen previously. Aortic atherosclerotic calcification. The lungs show mild scarring but no infiltrate, collapse or effusion. No pulmonary edema. Ordinary degenerative changes affect the spine and shoulders. IMPRESSION: No active  disease. Large hiatal hernia. Aortic atherosclerosis. Mild pulmonary scarring. Electronically Signed   By: Nelson Chimes M.D.   On: 06/21/2021 13:36   CT HEAD CODE STROKE WO CONTRAST  Result Date: 06/21/2021 CLINICAL DATA:  Code stroke. There deficit, acute, stroke suspected. EXAM: CT HEAD WITHOUT CONTRAST TECHNIQUE: Contiguous axial images were obtained from the base of the skull through the vertex without intravenous contrast. RADIATION DOSE REDUCTION: This exam was performed according to the departmental dose-optimization program which includes automated exposure control, adjustment of the mA and/or kV according to patient size and/or use of iterative reconstruction technique. COMPARISON:  09/24/2019 FINDINGS: Brain: Age related volume loss. Mild to moderate chronic small-vessel ischemic changes of the hemispheric white matter. No sign of acute infarction, mass lesion, hemorrhage, hydrocephalus or extra-axial collection. Vascular: There is atherosclerotic calcification of the major vessels at the base of the brain. Skull: Negative Sinuses/Orbits: Clear/normal Other: None ASPECTS ([place] Stroke Program Early CT Score) - Ganglionic level infarction (caudate, lentiform nuclei, internal capsule, insula, M1-M3 cortex): 7 - Supraganglionic infarction (M4-M6 cortex): 3 Total score (0-10 with 10 being normal): 10 IMPRESSION: 1. No acute CT finding. Age related volume loss. Chronic small-vessel ischemic changes of the cerebral hemispheric white matter. 2. ASPECTS is 10. 3. These results were called by telephone at the time of interpretation on 06/21/2021 at 1:25 pm to provider JOSEPH ZAMMIT , who verbally acknowledged these results. Electronically Signed   By: Nelson Chimes M.D.   On: 06/21/2021 13:32    DVT Prophylaxis -SCD /Heparin AM Labs Ordered, also please review Full Orders  Family Communication: Admission, patients condition and plan of care including tests being ordered have been discussed with the patient  and son who indicate understanding and agree with the plan   Condition  -stable  Roxan Hockey M.D on 06/21/2021 at 5:49 PM Go to [email or web address] -  for contact info  [facility] - Office  [phone]

## 2021-06-21 NOTE — Telephone Encounter (Signed)
Pt son came into office stating she is over in the ED. He states he will be unable to bring her today to get the lab work requested. He is unsure if they are keeping her overnight (he thinks they will keep her). He said they more than likely are going to do labs there.

## 2021-06-21 NOTE — ED Notes (Signed)
US at bedside

## 2021-06-21 NOTE — Consult Note (Signed)
[facility] TeleNeurology Consult Services    Date of Service:  06/21/2021      Metrics: Last Known Well: 1:00 PM Symptoms: As per HPI.  Patient is not a candidate for thrombolytic. Presentation not consistent with stroke; overall clinical findings  are most consistent with partial complex seizure or cognitive fluctuation in the setting of probable undiagnosed dementia.    Location of the provider: [facility]  Location of the patient: [name] Pre-Morbid Modified Rankin Scale: 1 Time Code Stroke Page received:  1:20 PM Time neurologist arrived:  1:26 PM   This consult was provided via telemedicine with 2-way video and audio communication. The patient/family was informed that care would be provided in this way and agreed to receive care in this manner.   ED Physician notified of diagnostic impression and management plan following neurological exam.    Assessment: 88 year old female presenting after a 15 minute spell of closed-eyes unresponsiveness without jerking or posturing, but with bowel and bladder incontinence as well as foaming at the mouth. This is her 5th such spell in the past 4 years, all of which have been similar but not as severe as the current episode.  - Exam reveals cognitive deficits and equivocal non-homonymous quadrantanopsias OD and OS. NIHSS 5. - CT head: No acute CT finding. Age related volume loss. Chronic small-vessel ischemic changes of the cerebral hemispheric white matter. - Overall presentation is most consistent with partial complex seizure or cognitive fluctuation in the setting of probable undiagnosed dementia. Stroke is felt to be unlikely.      Recommendations: - MRI brain - EEG - Toxic/metabolic/infectious work up - Seizure precautions - Avoid sedating medications - Outpatient Neurology follow up with [facility] for evaluation of possible dementia.       ------------------------------------------------------------------------------   History of Present Illness: 88 year old female with a PMHx of carcinoid tumor, HTN, HLD, short term memory deficit (on donepezil) and pernicious anemia (diagnosed 2012) who presents to the ED after she became unresponsive while sitting together with her son in his car. Her son was about to start a videoconference at 1 PM when he noticed that his mother was slumped over with her eyes closed. He tried to communicate with her but she continued  to be unresponsive with no eye opening or limb movements. She did make some grunting noises but otherwise did not respond despite his repeated attempts to arouse her. There was no limb jerking, twitching or posturing. She did have some foaming at the mouth, but no blood noted by son. She began to come out of it after about 15 minutes, after EMS arrived. On arrival to the ED, it was noted by nurses that the patient had been incontinent of bowel and bladder. In the ED she was able to respond to questions. She has had 4 other such spells in the past 3-4 years, all of which were similar, but not of which were as severe in terms of degree of unresponsiveness, as with this occurrence.   At baseline she has good long term memory, but cannot recall what was said to her after about 3 minutes. Poor short term memory was first noticed about 4 years ago. She has, over the past few weeks, been declining in her ability to wash herself, per son. She did have an extensive work up after the first spell of unresponsiveness about 4 years ago, but this was inconclusive, according to her son.   CT  head in the ED shows no acute abnormality. Diffuse cerebral atrophy is noted.      Past Medical History:  Diagnosis Date   Adult BMI 19-24 kg/sq m 2008 125 lbs   Anemia    Pernicious 2008-HB 12 MCV 119.5; JAN 2012 HB 13.2 MCV 97.6   Carcinoid tumor of stomach 2008   Diverticulosis    GERD (gastroesophageal  reflux disease)    Hiatal hernia    HTN (hypertension)    Hyperlipidemia    Hypertension    Phreesia 02/07/2020   Mild memory loss 02/15/2011   Near syncope 04/18/2014   Pernicious anemia    Pernicious anemia 06/14/2010   Wrist fracture, left 2008     Past Surgical History: Past Surgical History:  Procedure Laterality Date   COLONOSCOPY  2003 LS   DC/[place] Diverticulosis   EUS  2008 DJ   FNA NO CARCINOID   EUS  JAN 2009   NL EXAM, NO Bx   EUS  [facility] 2011 WO   CARCINOID   UPPER GASTROINTESTINAL ENDOSCOPY  APR 2008 SLF   CARCIONOID   UPPER GASTROINTESTINAL ENDOSCOPY  FEB 2011 [facility]   CARCINOID/mild anemia/large hiatal hernia     Medications:  No current facility-administered medications on file prior to encounter.   Current Outpatient Medications on File Prior to Encounter  Medication Sig Dispense Refill   acetaminophen (TYLENOL) 500 MG tablet Take 500 mg by mouth every 6 (six) hours as needed. For pain     donepezil (ARICEPT) 5 MG tablet TAKE 1 TABLET BY MOUTH AT BEDTIME 30 tablet 5   felodipine (PLENDIL) 5 MG 24 hr tablet Take 1 tablet (5 mg total) by mouth daily. 90 tablet 1   levothyroxine (SYNTHROID) 25 MCG tablet Take 1 tablet (25 mcg total) by mouth daily before breakfast. 90 tablet 1   simvastatin (ZOCOR) 40 MG tablet Take 1 tablet (40 mg total) by mouth daily. 90 tablet 3   valsartan (DIOVAN) 40 MG tablet Take 1 tablet (40 mg total) by mouth daily. 90 tablet 1         Social History: Lives with family No history of smoking   Family History:  Reviewed in Epic   ROS: Deferred due to acuity of presentation   Anticoagulant use:  No   Antiplatelet use: No   Examination:    BP 134/64   Pulse 87   Temp 99.6 F (37.6 C) (Rectal)   Resp (!) 21   SpO2 98%     1A: Level of Consciousness - 1 1B: Ask Month and Age - 2 1C: Blink Eyes & Squeeze Hands - 1 2: Test Horizontal Extraocular Movements - 0 3: Test Visual Fields - 1  (OS: left upper quadrant deficit; OD:  right lower quadrant deficit) 4: Test Facial Palsy (Use Grimace if Obtunded) - 0 5A: Test Left Arm Motor Drift - 0 5B: Test Right Arm Motor Drift - 0 6A: Test Left Leg Motor Drift - 0 6B: Test Right Leg Motor Drift - 0 7: Test Limb Ataxia (FNF/Heel-Shin) - 0 8: Test Sensation -  0 9: Test Language/Aphasia - 0 10: Test Dysarthria - 0 11: Test Extinction/Inattention - 0   NIHSS Score: 5     Patient/Family was informed the Neurology Consult would occur via TeleHealth consult by way of interactive audio and video telecommunications and consented to receiving care in this manner.   Patient is being evaluated for possible acute neurologic impairment and high pretest probability of imminent or  life-threatening deterioration. I spent total of 40 minutes providing care to this patient, including time for face to face visit via telemedicine, review of medical records, imaging studies and discussion of findings with providers, the patient and/or family.   Electronically signed: Dr. Kerney Elbe

## 2021-06-21 NOTE — ED Notes (Signed)
Upon assessment patient was soiled with urine and stool. New linens provided, pericare, gown placed, new brief, and purwick.

## 2021-06-21 NOTE — ED Notes (Addendum)
CT called this nurse to inform that IV has infiltrated. Second nurse going to attempt IV at this time.

## 2021-06-21 NOTE — Progress Notes (Signed)
Code stroke activated at 1317. Patient was already seen by provider and in CT dept at time of activation. Dr. Suan Halter connected to telestroke cart for code stroke evaluation at 1326. Patient returned from [place] at 1327.

## 2021-06-21 NOTE — ED Provider Notes (Signed)
[facility] EMERGENCY DEPARTMENT Provider Note   CSN: [id number] Arrival date & time: 06/21/21  1313  An emergency department physician performed an initial assessment on this suspected stroke patient at 1315.  History {Add pertinent medical, surgical, social history, OB history to HPI:1} Chief Complaint  Patient presents with   Altered Mental Status    [name] is a 88 y.o. female.  Patient has a history of hypertension and anemia.  She was in the car with her son and she slumped over and was drooling and when she came to she was altered and brought immediately to the emergency department.   Altered Mental Status     Home Medications Prior to Admission medications   Medication Sig Start Date End Date Taking? Authorizing Provider  acetaminophen (TYLENOL) 500 MG tablet Take 500 mg by mouth every 6 (six) hours as needed. For pain    [provider]  donepezil (ARICEPT) 5 MG tablet TAKE 1 TABLET BY MOUTH AT BEDTIME 04/23/21   [name], MD  felodipine (PLENDIL) 5 MG 24 hr tablet Take 1 tablet (5 mg total) by mouth daily. 02/05/21   [name], MD  levothyroxine (SYNTHROID) 25 MCG tablet Take 1 tablet (25 mcg total) by mouth daily before breakfast. 06/20/21   [name], MD  simvastatin (ZOCOR) 40 MG tablet Take 1 tablet (40 mg total) by mouth daily. 04/24/21   [name], MD  valsartan (DIOVAN) 40 MG tablet Take 1 tablet (40 mg total) by mouth daily. 06/20/21   [name], MD      Allergies    Codeine    Review of Systems   Review of Systems  Physical Exam Updated Vital Signs BP 134/64   Pulse 87   Temp 99.6 F (37.6 C) (Rectal)   Resp (!) 21   SpO2 98%  Physical Exam  ED Results / Procedures / Treatments   Labs (all labs ordered are listed, but only abnormal results are displayed) Labs Reviewed  CBC - Abnormal; Notable for the following components:      Result Value   MCV 101.7 (*)    Platelets 111 (*)    All other components  within normal limits  COMPREHENSIVE METABOLIC PANEL - Abnormal; Notable for the following components:   Glucose, Bld 116 (*)    Creatinine, Ser 1.20 (*)    Calcium 8.8 (*)    GFR, Estimated 41 (*)    All other components within normal limits  CBG MONITORING, ED - Abnormal; Notable for the following components:   Glucose-Capillary 102 (*)    All other components within normal limits  I-STAT CHEM 8, ED - Abnormal; Notable for the following components:   Creatinine, Ser 1.30 (*)    Glucose, Bld 117 (*)    All other components within normal limits  RESP PANEL BY RT-PCR (FLU A&B, COVID) ARPGX2  PROTIME-INR  APTT  DIFFERENTIAL  RAPID URINE DRUG SCREEN, HOSP PERFORMED  ETHANOL  URINALYSIS, ROUTINE W REFLEX MICROSCOPIC  TROPONIN I (HIGH SENSITIVITY)  TROPONIN I (HIGH SENSITIVITY)    EKG None  Radiology DG Chest Port 1 View  Result Date: 06/21/2021 CLINICAL DATA:  Weakness.  Altered mental status.  Code stroke. EXAM: PORTABLE CHEST 1 VIEW COMPARISON:  09/24/2019 FINDINGS: Mild cardiomegaly. Large hiatal hernia as seen previously. Aortic atherosclerotic calcification. The lungs show mild scarring but no infiltrate, collapse or effusion. No pulmonary edema. Ordinary degenerative changes affect the spine and shoulders. IMPRESSION: No active  disease. Large hiatal hernia. Aortic atherosclerosis. Mild pulmonary scarring. Electronically Signed   By: Nelson Chimes M.D.   On: 06/21/2021 13:36   CT HEAD CODE STROKE WO CONTRAST  Result Date: 06/21/2021 CLINICAL DATA:  Code stroke. There deficit, acute, stroke suspected. EXAM: CT HEAD WITHOUT CONTRAST TECHNIQUE: Contiguous axial images were obtained from the base of the skull through the vertex without intravenous contrast. RADIATION DOSE REDUCTION: This exam was performed according to the departmental dose-optimization program which includes automated exposure control, adjustment of the mA and/or kV according to patient size and/or use of iterative  reconstruction technique. COMPARISON:  09/24/2019 FINDINGS: Brain: Age related volume loss. Mild to moderate chronic small-vessel ischemic changes of the hemispheric white matter. No sign of acute infarction, mass lesion, hemorrhage, hydrocephalus or extra-axial collection. Vascular: There is atherosclerotic calcification of the major vessels at the base of the brain. Skull: Negative Sinuses/Orbits: Clear/normal Other: None ASPECTS ([place] Stroke Program Early CT Score) - Ganglionic level infarction (caudate, lentiform nuclei, internal capsule, insula, M1-M3 cortex): 7 - Supraganglionic infarction (M4-M6 cortex): 3 Total score (0-10 with 10 being normal): 10 IMPRESSION: 1. No acute CT finding. Age related volume loss. Chronic small-vessel ischemic changes of the cerebral hemispheric white matter. 2. ASPECTS is 10. 3. These results were called by telephone at the time of interpretation on 06/21/2021 at 1:25 pm to provider Scherry Laverne , who verbally acknowledged these results. Electronically Signed   By: Nelson Chimes M.D.   On: 06/21/2021 13:32    Procedures Procedures  {Document cardiac monitor, telemetry assessment procedure when appropriate:1}  Medications Ordered in ED Medications - No data to display  ED Course/ Medical Decision Making/ A&P Code stroke was called on the patient and the neurologist decided that more likely a syncopal episode or seizure.  Neurology recommended admission with EEG and neurology consult in the hospital   CRITICAL CARE Performed by: Milton Ferguson Total critical care time: 45 minutes Critical care time was exclusive of separately billable procedures and treating other patients. Critical care was necessary to treat or prevent imminent or life-threatening deterioration. Critical care was time spent personally by me on the following activities: development of treatment plan with patient and/or surrogate as well as nursing, discussions with consultants, evaluation of  patient's response to treatment, examination of patient, obtaining history from patient or surrogate, ordering and performing treatments and interventions, ordering and review of laboratory studies, ordering and review of radiographic studies, pulse oximetry and re-evaluation of patient's condition.                          Medical Decision Making Amount and/or Complexity of Data Reviewed Labs: ordered. Radiology: ordered.  Risk Decision regarding hospitalization.   Patient with altered mental status and syncope possible seizure.  She is admitted to medicine with neuro consult  {Document critical care time when appropriate:1} {Document review of labs and clinical decision tools ie heart score, Chads2Vasc2 etc:1}  {Document your independent review of radiology images, and any outside records:1} {Document your discussion with family members, caretakers, and with consultants:1} {Document social determinants of health affecting pt's care:1} {Document your decision making why or why not admission, treatments were needed:1} Final Clinical Impression(s) / ED Diagnoses Final diagnoses:  Syncope and collapse    Rx / DC Orders ED Discharge Orders     None

## 2021-06-21 NOTE — ED Notes (Signed)
Beeped and Called CODE STROKE @ 1315

## 2021-06-21 NOTE — ED Notes (Signed)
Attempted IV access x2 without success. Float nurse attempted IV access x2 without success. Reported to next nurse that IV will need to be established.

## 2021-06-22 ENCOUNTER — Observation Stay (HOSPITAL_BASED_OUTPATIENT_CLINIC_OR_DEPARTMENT_OTHER): Payer: PPO

## 2021-06-22 DIAGNOSIS — R55 Syncope and collapse: Secondary | ICD-10-CM

## 2021-06-22 DIAGNOSIS — R4189 Other symptoms and signs involving cognitive functions and awareness: Secondary | ICD-10-CM | POA: Diagnosis not present

## 2021-06-22 LAB — ECHOCARDIOGRAM COMPLETE
AR max vel: 2.11 cm2
AV Area VTI: 2.22 cm2
AV Area mean vel: 2.03 cm2
AV Mean grad: 4 mmHg
AV Peak grad: 8.1 mmHg
Ao pk vel: 1.42 m/s
Area-P 1/2: 2.62 cm2
MV VTI: 2.36 cm2
S' Lateral: 2.5 cm

## 2021-06-22 MED ORDER — ASPIRIN 81 MG PO TBEC: 81.0000 mg | DELAYED_RELEASE_TABLET | Freq: Every day | ORAL | 2 refills | Status: AC

## 2021-06-22 MED ORDER — CEPHALEXIN 250 MG PO CAPS: 250.0000 mg | ORAL_CAPSULE | Freq: Three times a day (TID) | ORAL | 0 refills | Status: AC

## 2021-06-22 MED ORDER — SODIUM CHLORIDE 0.9 % IV SOLN
1.0000 g | Freq: Once | INTRAVENOUS | Status: AC
  Administered 2021-06-22: 1 g via INTRAVENOUS
  Filled 2021-06-22: qty 10

## 2021-06-22 NOTE — Evaluation (Signed)
Physical Therapy Evaluation Patient Details Name: [name] MRN: [id number] DOB: [birth year] Today's Date: 06/22/2021  History of Present Illness  Claudine Stallings  is a 88 y.o. female past medical history of HTN, benign neuroendocrine carcinoid tumor of stomach, iron deficiency anemia, vitamin B12 deficiency and malnutrition who was actually seen by PCP Dr. Posey Pronto on 06/10/2021 after an unwitnessed fall while patient was walking towards the kitchen from hands on room  -  -Patient presents to the ED today with her son -Son states they were both sitting in car and he was working on computer when he looked over he found pt to be slumped over and white foam coming from the mouth; pt was not alert   Son drove to hospital and pt assisted onto stretcher; pt is alert to name and had an incontinence of urine;   Son states pt is still not at her normal baseline    Clinical Impression  Patient limited for functional mobility as stated below secondary to BLE weakness, fatigue and impaired standing balance. Patient does not require assist for bed mobility but does require increased time to complete. She demonstrates good sitting balance and tolerance at EOB and is able to don socks independently. Patient transfers to standing without AD and reaches to IV pole for support but does not require assist to power up to standing. She ambulates with trunk flexed and use if IV pole for balance and ends session seated in chair at bedside. Min guard assist provided for safety/balance while standing/ambulation but patient without loss of balance. Patient will benefit from continued physical therapy in hospital and recommended venue below to increase strength, balance, endurance for safe ADLs and gait.        Recommendations for follow up therapy are one component of a multi-disciplinary discharge planning process, led by the attending physician.  Recommendations may be updated based on patient status, additional functional  criteria and insurance authorization.  Follow Up Recommendations Home health PT    Assistance Recommended at Discharge Intermittent Supervision/Assistance  Patient can return home with the following  A little help with walking and/or transfers;A little help with bathing/dressing/bathroom;Assistance with cooking/housework;Assist for transportation    Equipment Recommendations None recommended by PT  Recommendations for Other Services       Functional Status Assessment Patient has had a recent decline in their functional status and demonstrates the ability to make significant improvements in function in a reasonable and predictable amount of time.     Precautions / Restrictions Precautions Precautions: Fall Restrictions Weight Bearing Restrictions: No      Mobility  Bed Mobility Overal bed mobility: Modified Independent             General bed mobility comments: requires increased time    Transfers Overall transfer level: Needs assistance Equipment used: None Transfers: Sit to/from Stand Sit to Stand: Min guard           General transfer comment: without AD, reaches for IV pole upon standing for support    Ambulation/Gait Ambulation/Gait assistance: Min guard Gait Distance (Feet): 180 Feet Assistive device: IV Pole Gait Pattern/deviations: Trunk flexed       General Gait Details: slow cadence with IV pole, unilateral UE support for 1st half, bilateral UE support for second half back to room  Stairs            Wheelchair Mobility    Modified Rankin (Stroke Patients Only)       Balance Overall balance assessment: Needs assistance  Sitting-balance support: Feet supported, No upper extremity supported Sitting balance-Leahy Scale: Good Sitting balance - Comments: seated EOB   Standing balance support: Single extremity supported Standing balance-Leahy Scale: Fair Standing balance comment: good/fair with IV pole                              Pertinent Vitals/Pain Pain Assessment Pain Assessment: No/denies pain    Home Living Family/patient expects to be discharged to:: Private residence Living Arrangements: Children Available Help at Discharge: Family Type of Home: House Home Access: Level entry       Home Layout: Able to live on main level with bedroom/bathroom Home Equipment: None      Prior Function Prior Level of Function : Independent/Modified Independent             Mobility Comments: states household ambulation without AD ADLs Comments: states independent with basic ADL     Hand Dominance        Extremity/Trunk Assessment   Upper Extremity Assessment Upper Extremity Assessment: Generalized weakness;Overall [facility] for tasks assessed    Lower Extremity Assessment Lower Extremity Assessment: Generalized weakness;Overall [facility] for tasks assessed    Cervical / Trunk Assessment Cervical / Trunk Assessment: Kyphotic  Communication   Communication: No difficulties  Cognition Arousal/Alertness: Awake/alert Behavior During Therapy: WFL for tasks assessed/performed Overall Cognitive Status: Within Functional Limits for tasks assessed                                          General Comments      Exercises     Assessment/Plan    PT Assessment Patient needs continued PT services  PT Problem List Decreased strength;Decreased mobility;Decreased activity tolerance;Decreased balance;Decreased knowledge of use of DME       PT Treatment Interventions DME instruction;Therapeutic exercise;Gait training;Balance training;Stair training;Neuromuscular re-education;Functional mobility training;Therapeutic activities;Patient/family education    PT Goals (Current goals can be found in the Care Plan section)  Acute Rehab PT Goals Patient Stated Goal: return home PT Goal Formulation: With patient Time For Goal Achievement: 07/06/21 Potential to Achieve Goals: Good    Frequency Min  3X/week     Co-evaluation               AM-PAC PT "6 Clicks" Mobility  Outcome Measure Help needed turning from your back to your side while in a flat bed without using bedrails?: None Help needed moving from lying on your back to sitting on the side of a flat bed without using bedrails?: A Little Help needed moving to and from a bed to a chair (including a wheelchair)?: A Little Help needed standing up from a chair using your arms (e.g., wheelchair or bedside chair)?: A Little Help needed to walk in hospital room?: A Little Help needed climbing 3-5 steps with a railing? : A Lot 6 Click Score: 18    End of Session Equipment Utilized During Treatment: Gait belt Activity Tolerance: Patient tolerated treatment well Patient left: in chair;with call bell/phone within reach;Other (comment) (with MD present) Nurse Communication: Mobility status PT Visit Diagnosis: Unsteadiness on feet (R26.81);Other abnormalities of gait and mobility (R26.89);Muscle weakness (generalized) (M62.81)    Time: 1245-8099 PT Time Calculation (min) (ACUTE ONLY): 25 min   Charges:   PT Evaluation $PT Eval Low Complexity: 1 Low PT Treatments $Therapeutic Activity: 8-22 mins  10:13 AM, 06/22/21 Mearl Latin PT, DPT Physical Therapist at [facility]

## 2021-06-22 NOTE — Progress Notes (Signed)
*  PRELIMINARY RESULTS* Echocardiogram 2D Echocardiogram has been performed.  [name] 06/22/2021, 12:40 PM

## 2021-06-22 NOTE — Discharge Instructions (Addendum)
1)Please take Cephalexin/Keflex antibiotic as prescribed for possible urinary tract infection  2)follow up with [name], MD in 1 week for recheck  3)Please follow-up with Neurologist Dr. Phillips Odor-- Phone: [phone], Address: [address], [place], [place] [address] in 2 to 3 weeks for recheck and reevaluation and to discuss carotid artery stenosis -  Please call to make appointment with him

## 2021-06-22 NOTE — Plan of Care (Signed)
  Problem: Acute Rehab PT Goals(only PT should resolve) Goal: Patient Will Transfer Sit To/From Stand Outcome: Progressing Flowsheets (Taken 06/22/2021 1014) Patient will transfer sit to/from stand:  with supervision  with modified independence Goal: Pt Will Transfer Bed To Chair/Chair To Bed Outcome: Progressing Flowsheets (Taken 06/22/2021 1014) Pt will Transfer Bed to Chair/Chair to Bed:  with modified independence  with supervision Goal: Pt Will Ambulate Outcome: Progressing Flowsheets (Taken 06/22/2021 1014) Pt will Ambulate:  > 125 feet  with supervision  with least restrictive assistive device Goal: Pt/caregiver will Perform Home Exercise Program Outcome: Progressing Flowsheets (Taken 06/22/2021 1014) Pt/caregiver will Perform Home Exercise Program:  For increased strengthening  For improved balance  Independently  10:14 AM, 06/22/21 Mearl Latin PT, DPT Physical Therapist at [facility]

## 2021-06-22 NOTE — Discharge Summary (Addendum)
[name], is a 88 y.o. female  DOB [birth year]  MRN [id number].  Admission date:  06/21/2021  Admitting Physician  Roxan Hockey, MD  Discharge Date:  06/22/2021   Primary MD  [name], MD  Recommendations for primary care physician for things to follow:   1)Please take Cephalexin/Keflex antibiotic as prescribed for possible urinary tract infection  2)follow up with [name], MD in 1 week for recheck   Admission Diagnosis  Syncope and collapse [R55] Episode of unresponsiveness [R41.89]   Discharge Diagnosis  Syncope and collapse [R55] Episode of unresponsiveness [R41.89]    Principal Problem:   Episode of unresponsiveness Active Problems:   Carotid disease, bilateral ([place])   Essential hypertension   Iron deficiency anemia   Hypothyroidism   Late onset Alzheimer's dementia without behavioral disturbance (HCC)   Stage 3a chronic kidney disease ([place])      Past Medical History:  Diagnosis Date   Adult BMI 19-24 kg/sq m 2008 125 lbs   Anemia    Pernicious 2008-HB 12 MCV 119.5; JAN 2012 HB 13.2 MCV 97.6   Carcinoid tumor of stomach 2008   Diverticulosis    GERD (gastroesophageal reflux disease)    Hiatal hernia    HTN (hypertension)    Hyperlipidemia    Hypertension    Phreesia 02/07/2020   Mild memory loss 02/15/2011   Near syncope 04/18/2014   Pernicious anemia    Pernicious anemia 06/14/2010   Wrist fracture, left 2008    Past Surgical History:  Procedure Laterality Date   COLONOSCOPY  2003 LS   DC/[place] Diverticulosis   EUS  2008 DJ   FNA NO CARCINOID   EUS  JAN 2009   NL EXAM, NO Bx   EUS  [facility] 2011 WO   CARCINOID   UPPER GASTROINTESTINAL ENDOSCOPY  APR 2008 SLF   CARCIONOID   UPPER GASTROINTESTINAL ENDOSCOPY  FEB 2011 SLF   CARCINOID/mild anemia/large hiatal hernia     HPI  from the history and physical done on the day of admission:    [name]  is a  88 y.o. female past medical history of HTN, benign neuroendocrine carcinoid tumor of stomach, iron deficiency anemia, vitamin B12 deficiency and malnutrition who was actually seen by PCP Dr. Posey Pronto on 06/10/2021 after an unwitnessed fall while patient was walking towards the kitchen from hands on room - -Patient presents to the ED today with her son -Son states they were both sitting in car and he was working on computer when he looked over he found pt to be slumped over and white foam coming from the mouth; pt was not alert  Son drove to hospital and pt assisted onto stretcher; pt is alert to name and had an incontinence of urine;  Son states pt is still not at her normal baseline -Code stroke and neurology consult in the ED was activated -CT head without acute findings -Carotid artery Dopplers with severe stenosis -Chest x-ray without acute finding Troponin is not elevated UDS negative  CBC with white count of 6.9 hemoglobin of 12.8 and platelets of 111 -Creatinine 1.2 which is similar to prior baseline -LFTs are not elevated -EKG sinus rhythm with LVH -UA with suggestive of possible UTI    Hospital Course:   Assessment and Plan: 1) episode of sudden unresponsiveness--seizures versus stroke versus syncope -EEG w/o acute findings (episode was associated with foaming from the mouth as well as bladder and bowel incontinence) -Patient with at least 5 previous episodes of unresponsiveness in the past 4years raising concerns about possible seizure disorder -CT head without acute stroke -Carotid artery Dopplers with 70 to 99% stenosis of the proximal right internal carotid artery -Severe (70-99%) stenosis proximal right internal carotid artery secondary to bulky heterogeneous atherosclerotic plaque. Plaque burden is largely in the carotid bifurcation and extending into the internal and external carotid arteries. --On telemetry no significant arrhythmia -Neurology consult appreciated -Carotid  artery Doppler findings noted however after further discussion with patient and patient's son they would not want overtly aggressive interventions--- -We defer decision on possible CTA head and neck and possible interventions for right-sided carotid artery stenosis to the neurologist as outpatient -Already takes simvastatin okay to add aspirin -Outpatient follow-up with Dr. Merlene Laughter advised to discuss this further -Echo with preserved EF of 65 to 70% and grade 1 diastolic dysfunction with moderate LVH -Again EEG without acute findings -Physical therapy eval appreciated recommends home health PT   2) CKD 3 A - renally adjust medications, avoid nephrotoxic agents / dehydration  / hypotension   3) hypothyroidism--continue levothyroxine   4) possible UTI--treated with IV Rocephin okay to discharge on p.o. Keflex    Disposition--Home with home health services   Dispo: The patient is from: Home              Anticipated d/c is to: Home  Discharge Condition: stable  Follow UP   Follow-up Information     Care, [facility] Follow up.   Specialty: [place] Why: [facility] staff will call you to schedule in home visits for physical therapy Contact information: [address] [place] [address] [phone]         [name], MD Follow up in 1 week(s).   Specialty: Internal Medicine Contact information: [address] [place] [address] [phone]         Phillips Odor, MD. Schedule an appointment as soon as possible for a visit.   Specialty: Neurology Why: Severe (70-99%) stenosis proximal right internal carotid artery Contact information: [address] [place] [address] [phone]                 Diet and Activity recommendation:  As advised  Discharge Instructions    Discharge Instructions     Call MD for:  difficulty breathing, headache or visual disturbances   Complete by: As directed    Call MD for:   persistant dizziness or light-headedness   Complete by: As directed    Call MD for:  persistant nausea and vomiting   Complete by: As directed    Call MD for:  temperature >100.4   Complete by: As directed    Diet general   Complete by: As directed    Discharge instructions   Complete by: As directed    1)Please take Cephalexin/Keflex antibiotic as prescribed for possible urinary tract infection  2)follow up with [name], MD in 1 week for recheck  3)Please follow-up with Neurologist Dr. Phillips Odor-- Phone: (  [phone], Address: [address], [place], [place] [address] in 2 to 3 weeks for recheck and reevaluation and to discuss carotid artery stenosis -  Please call to make appointment with him   Increase activity slowly   Complete by: As directed        Discharge Medications     Allergies as of 06/22/2021       Reactions   Codeine         Medication List     TAKE these medications    acetaminophen 500 MG tablet Commonly known as: TYLENOL Take 500 mg by mouth every 6 (six) hours as needed. For pain   aspirin EC 81 MG tablet Take 1 tablet (81 mg total) by mouth daily with breakfast.   cephALEXin 250 MG capsule Commonly known as: Keflex Take 1 capsule (250 mg total) by mouth 3 (three) times daily for 3 days. Start taking on: June 23, 2021   donepezil 5 MG tablet Commonly known as: ARICEPT TAKE 1 TABLET BY MOUTH AT BEDTIME   felodipine 5 MG 24 hr tablet Commonly known as: PLENDIL Take 1 tablet (5 mg total) by mouth daily.   levothyroxine 25 MCG tablet Commonly known as: SYNTHROID Take 1 tablet (25 mcg total) by mouth daily before breakfast.   simvastatin 40 MG tablet Commonly known as: ZOCOR Take 1 tablet (40 mg total) by mouth daily.   valsartan 40 MG tablet Commonly known as: DIOVAN Take 1 tablet (40 mg total) by mouth daily.        Major procedures and Radiology Reports - PLEASE review detailed and final reports for all  details, in brief -   US Carotid Bilateral  Result Date: 06/21/2021 CLINICAL DATA:  Syncope EXAM: BILATERAL CAROTID DUPLEX ULTRASOUND TECHNIQUE: Pearline Cables scale imaging, color Doppler and duplex ultrasound were performed of bilateral carotid and vertebral arteries in the neck. COMPARISON:  None Available. FINDINGS: Criteria: Quantification of carotid stenosis is based on velocity parameters that correlate the residual internal carotid diameter with NASCET-based stenosis levels, using the diameter of the distal internal carotid lumen as the denominator for stenosis measurement. The following velocity measurements were obtained: RIGHT ICA: 246/18 cm/sec CCA: 176/1 cm/sec SYSTOLIC ICA/CCA RATIO:  2.3 ECA:  303 cm/sec LEFT ICA: 64/10 cm/sec CCA: 607/3 cm/sec SYSTOLIC ICA/CCA RATIO:  0.5 ECA:  127 cm/sec RIGHT CAROTID ARTERY: Extensive atherosclerotic plaque in the carotid bifurcation extending into the proximal internal and external carotid arteries. By peak systolic velocity criteria, the estimated stenosis in the internal carotid artery is greater than 70%. Similarly, there is an estimated greater than 60% diameter stenosis in the origin of the external carotid artery. RIGHT VERTEBRAL ARTERY:  Patent with normal antegrade flow. LEFT CAROTID ARTERY: Mild heterogeneous atherosclerotic plaque in the proximal internal carotid artery. By peak systolic velocity criteria, the estimated stenosis is less than 50%. LEFT VERTEBRAL ARTERY:  Patent with normal antegrade flow. IMPRESSION: 1. Severe (70-99%) stenosis proximal right internal carotid artery secondary to bulky heterogeneous atherosclerotic plaque. Plaque burden is largely in the carotid bifurcation and extending into the internal and external carotid arteries. 2. Mild (1-49%) stenosis proximal left internal carotid artery secondary to heterogenous atherosclerotic plaque. 3. Vertebral arteries are patent with normal antegrade flow. Signed, Criselda Peaches, MD, [place]  [facility] Electronically Signed   By: Jacqulynn Cadet M.D.   On: 06/21/2021 16:02   DG Chest Port 1 View  Result Date: 06/21/2021 CLINICAL DATA:  Weakness.  Altered mental status.  Code stroke. EXAM: PORTABLE CHEST 1 VIEW COMPARISON:  09/24/2019 FINDINGS: Mild cardiomegaly. Large hiatal hernia as seen previously. Aortic atherosclerotic calcification. The lungs show mild scarring but no infiltrate, collapse or effusion. No pulmonary edema. Ordinary degenerative changes affect the spine and shoulders. IMPRESSION: No active disease. Large hiatal hernia. Aortic atherosclerosis. Mild pulmonary scarring. Electronically Signed   By: Nelson Chimes M.D.   On: 06/21/2021 13:36   EEG adult  Result Date: 06/22/2021 Lora Havens, MD     06/22/2021  8:14 AM Patient Name: [name] MRN: [id number] Epilepsy Attending: Lora Havens Referring Physician/Provider: Roxan Hockey, MD Date: 06/21/2021 Duration: 22.49 mins Patient history: 88 year old female with syncope.  EEG to evaluate for seizure. Level of alertness: Awake, asleep AEDs during EEG study: None Technical aspects: This EEG study was done with scalp electrodes positioned according to the 10-20 International system of electrode placement. Electrical activity was acquired at a sampling rate of '500Hz'$  and reviewed with a high frequency filter of '70Hz'$  and a low frequency filter of '1Hz'$ . EEG data were recorded continuously and digitally stored. Description: The posterior dominant rhythm consists of 8-9 Hz activity of moderate voltage (25-35 uV) seen predominantly in posterior head regions, symmetric and reactive to eye opening and eye closing. Sleep was characterized by vertex waves, sleep spindles (12 to 14 Hz), maximal frontocentral region. Hyperventilation and photic stimulation were not performed.   IMPRESSION: This study is within normal limits. No seizures or epileptiform discharges were seen  throughout the recording. Lora Havens   ECHOCARDIOGRAM COMPLETE  Result Date: 06/22/2021    ECHOCARDIOGRAM REPORT   Patient Name:   [name] Date of Exam: 06/22/2021 Medical Rec #:  [id number]      Height:       61.0 in Accession #:    [id number]     Weight:       98.8 lb Date of Birth:  07-26-1922      BSA:          1.400 m Patient Age:    44 years       BP:           160/75 mmHg Patient Gender: F              HR:           91 bpm. Exam Location:  [facility] Procedure: 2D Echo, Cardiac Doppler and Color Doppler Indications:    Syncope  History:        Patient has prior history of Echocardiogram examinations, most                 recent 09/25/2019. Risk Factors:Hypertension and Dyslipidemia.  Sonographer:    Wenda Low Referring Phys: [place]  1. Left ventricular ejection fraction, by estimation, is 65 to 70%. The left ventricle has normal function. The left ventricle has no regional wall motion abnormalities. There is moderate left ventricular hypertrophy. Left ventricular diastolic parameters are consistent with Grade I diastolic dysfunction (impaired relaxation).  2. Right ventricular systolic function is normal. The right ventricular size is normal. There is normal pulmonary artery systolic pressure. The estimated right ventricular systolic pressure is 94.1 mmHg.  3. Left atrial size was mildly dilated.  4. The mitral valve is normal in structure. No evidence of mitral valve regurgitation. No evidence of mitral stenosis.  5. The aortic valve is tricuspid. Aortic valve regurgitation is not visualized. Aortic valve sclerosis is present,  with no evidence of aortic valve stenosis.  6. The inferior vena cava is normal in size with greater than 50% respiratory variability, suggesting right atrial pressure of 3 mmHg. FINDINGS  Left Ventricle: Left ventricular ejection fraction, by estimation, is 65 to 70%. The left ventricle has normal function. The left ventricle has no  regional wall motion abnormalities. The left ventricular internal cavity size was small. There is moderate  left ventricular hypertrophy. Left ventricular diastolic parameters are consistent with Grade I diastolic dysfunction (impaired relaxation). Right Ventricle: The right ventricular size is normal. No increase in right ventricular wall thickness. Right ventricular systolic function is normal. There is normal pulmonary artery systolic pressure. The tricuspid regurgitant velocity is 2.28 m/s, and  with an assumed right atrial pressure of 3 mmHg, the estimated right ventricular systolic pressure is 79.3 mmHg. Left Atrium: Left atrial size was mildly dilated. Right Atrium: Right atrial size was normal in size. Pericardium: There is no evidence of pericardial effusion. Mitral Valve: The mitral valve is normal in structure. No evidence of mitral valve regurgitation. No evidence of mitral valve stenosis. MV peak gradient, 6.2 mmHg. The mean mitral valve gradient is 1.0 mmHg. Tricuspid Valve: The tricuspid valve is normal in structure. Tricuspid valve regurgitation is trivial. Aortic Valve: The aortic valve is tricuspid. Aortic valve regurgitation is not visualized. Aortic valve sclerosis is present, with no evidence of aortic valve stenosis. Aortic valve mean gradient measures 4.0 mmHg. Aortic valve peak gradient measures 8.1  mmHg. Aortic valve area, by VTI measures 2.22 cm. Pulmonic Valve: The pulmonic valve was not well visualized. Pulmonic valve regurgitation is not visualized. Aorta: The aortic root is normal in size and structure. Venous: The inferior vena cava is normal in size with greater than 50% respiratory variability, suggesting right atrial pressure of 3 mmHg. IAS/Shunts: The interatrial septum was not well visualized.  LEFT VENTRICLE PLAX 2D LVIDd:         3.40 cm   Diastology LVIDs:         2.50 cm   LV e' medial:    4.57 cm/s LV PW:         1.20 cm   LV E/e' medial:  11.4 LV IVS:        1.20 cm   LV  e' lateral:   5.33 cm/s LVOT diam:     1.80 cm   LV E/e' lateral: 9.8 LV SV:         64 LV SV Index:   46 LVOT Area:     2.54 cm  RIGHT VENTRICLE RV Basal diam:  2.70 cm RV Mid diam:    2.20 cm RV S prime:     15.70 cm/s TAPSE (M-mode): 2.2 cm LEFT ATRIUM             Index        RIGHT ATRIUM           Index LA diam:        3.60 cm 2.57 cm/m   RA Area:     11.90 cm LA Vol (A2C):   48.9 ml 34.94 ml/m  RA Volume:   23.40 ml  16.72 ml/m LA Vol (A4C):   51.3 ml 36.65 ml/m LA Biplane Vol: 51.5 ml 36.80 ml/m  AORTIC VALVE                    PULMONIC VALVE AV Area (Vmax):    2.11 cm     PV Vmax:  1.15 m/s AV Area (Vmean):   2.03 cm     PV Peak grad:  5.3 mmHg AV Area (VTI):     2.22 cm AV Vmax:           142.00 cm/s AV Vmean:          90.400 cm/s AV VTI:            0.288 m AV Peak Grad:      8.1 mmHg AV Mean Grad:      4.0 mmHg LVOT Vmax:         118.00 cm/s LVOT Vmean:        72.200 cm/s LVOT VTI:          0.251 m LVOT/AV VTI ratio: 0.87  AORTA Ao Root diam: 2.50 cm MITRAL VALVE                TRICUSPID VALVE MV Area (PHT): 2.62 cm     TR Peak grad:   20.8 mmHg MV Area VTI:   2.36 cm     TR Vmax:        228.00 cm/s MV Peak grad:  6.2 mmHg MV Mean grad:  1.0 mmHg     SHUNTS MV Vmax:       1.25 m/s     Systemic VTI:  0.25 m MV Vmean:      50.4 cm/s    Systemic Diam: 1.80 cm MV Decel Time: 290 msec MV E velocity: 52.30 cm/s MV A velocity: 119.00 cm/s MV E/A ratio:  0.44 Oswaldo Milian MD Electronically signed by Oswaldo Milian MD Signature Date/Time: 06/22/2021/4:18:57 PM    Final    CT HEAD CODE STROKE WO CONTRAST  Result Date: 06/21/2021 CLINICAL DATA:  Code stroke. There deficit, acute, stroke suspected. EXAM: CT HEAD WITHOUT CONTRAST TECHNIQUE: Contiguous axial images were obtained from the base of the skull through the vertex without intravenous contrast. RADIATION DOSE REDUCTION: This exam was performed according to the departmental dose-optimization program which includes automated  exposure control, adjustment of the mA and/or kV according to patient size and/or use of iterative reconstruction technique. COMPARISON:  09/24/2019 FINDINGS: Brain: Age related volume loss. Mild to moderate chronic small-vessel ischemic changes of the hemispheric white matter. No sign of acute infarction, mass lesion, hemorrhage, hydrocephalus or extra-axial collection. Vascular: There is atherosclerotic calcification of the major vessels at the base of the brain. Skull: Negative Sinuses/Orbits: Clear/normal Other: None ASPECTS ([place] Stroke Program Early CT Score) - Ganglionic level infarction (caudate, lentiform nuclei, internal capsule, insula, M1-M3 cortex): 7 - Supraganglionic infarction (M4-M6 cortex): 3 Total score (0-10 with 10 being normal): 10 IMPRESSION: 1. No acute CT finding. Age related volume loss. Chronic small-vessel ischemic changes of the cerebral hemispheric white matter. 2. ASPECTS is 10. 3. These results were called by telephone at the time of interpretation on 06/21/2021 at 1:25 pm to provider JOSEPH ZAMMIT , who verbally acknowledged these results. Electronically Signed   By: Nelson Chimes M.D.   On: 06/21/2021 13:32    Micro Results  Recent Results (from the past 240 hour(s))  Resp Panel by RT-PCR (Flu A&B, Covid) Anterior Nasal Swab     Status: None   Collection Time: 06/21/21  1:19 PM   Specimen: Anterior Nasal Swab  Result Value Ref Range Status   SARS Coronavirus 2 by RT PCR NEGATIVE NEGATIVE Final    Comment: (NOTE) SARS-CoV-2 target nucleic acids are NOT DETECTED.  The SARS-CoV-2 RNA is generally detectable in upper respiratory  specimens during the acute phase of infection. The lowest concentration of SARS-CoV-2 viral copies this assay can detect is 138 copies/mL. A negative result does not preclude SARS-Cov-2 infection and should not be used as the sole basis for treatment or other patient management decisions. A negative result may occur with  improper specimen  collection/handling, submission of specimen other than nasopharyngeal swab, presence of viral mutation(s) within the areas targeted by this assay, and inadequate number of viral copies(<138 copies/mL). A negative result must be combined with clinical observations, patient history, and epidemiological information. The expected result is Negative.  Fact Sheet for Patients:  EntrepreneurPulse.com.au  Fact Sheet for Healthcare Providers:  IncredibleEmployment.be  This test is no t yet approved or cleared by the [place] FDA and  has been authorized for detection and/or diagnosis of SARS-CoV-2 by FDA under an Emergency Use Authorization (EUA). This EUA will remain  in effect (meaning this test can be used) for the duration of the COVID-19 declaration under Section 564(b)(1) of the Act, 21 U.S.C.section 360bbb-3(b)(1), unless the authorization is terminated  or revoked sooner.       Influenza A by PCR NEGATIVE NEGATIVE Final   Influenza B by PCR NEGATIVE NEGATIVE Final    Comment: (NOTE) The Xpert Xpress SARS-CoV-2/FLU/RSV plus assay is intended as an aid in the diagnosis of influenza from Nasopharyngeal swab specimens and should not be used as a sole basis for treatment. Nasal washings and aspirates are unacceptable for Xpert Xpress SARS-CoV-2/FLU/RSV testing.  Fact Sheet for Patients: EntrepreneurPulse.com.au  Fact Sheet for Healthcare Providers: IncredibleEmployment.be  This test is not yet approved or cleared by the [place] FDA and has been authorized for detection and/or diagnosis of SARS-CoV-2 by FDA under an Emergency Use Authorization (EUA). This EUA will remain in effect (meaning this test can be used) for the duration of the COVID-19 declaration under Section 564(b)(1) of the Act, 21 U.S.C. section 360bbb-3(b)(1), unless the authorization is terminated or revoked.  Performed at [facility], [address], [place], [place] [address]     Today   Subjective    [name] today has no new complaints No fever  Or chills   No Nausea, Vomiting or Diarrhea Son at bedside -Patient without chest pains, no palpitations no dizziness -Patient is eating and drinking okay -Patient is coherent and conversational  Patient has been seen and examined prior to discharge   Objective   Blood pressure (!) 126/55, pulse 97, temperature 98.3 F (36.8 C), temperature source Oral, resp. rate 18, SpO2 99 %.   Intake/Output Summary (Last 24 hours) at 06/22/2021 1706 Last data filed at 06/22/2021 0900 Gross per 24 hour  Intake 607 ml  Output 200 ml  Net 407 ml    Exam Gen:- Awake Alert, no acute distress  HEENT:- [place].AT, No sclera icterus Neck-Supple Neck,No JVD,.  Lungs-  CTAB , good air movement bilaterally CV- S1, S2 normal, regular Abd-  +ve B.Sounds, Abd Soft, No tenderness,    Extremity/Skin:- No  edema,   good pulses Psych-affect is appropriate, oriented x3 Neuro-no new focal deficits, no tremors    Data Review   CBC w Diff:  Lab Results  Component Value Date   WBC 6.9 06/21/2021   HGB 13.6 06/21/2021   HCT 40.0 06/21/2021   PLT 111 (L) 06/21/2021   LYMPHOPCT 13 06/21/2021   MONOPCT 7 06/21/2021   EOSPCT 0 06/21/2021   BASOPCT 0 06/21/2021    CMP:  Lab Results  Component Value  Date   NA 141 06/21/2021   NA 145 (H) 07/19/2020   K 3.7 06/21/2021   CL 105 06/21/2021   CO2 25 06/21/2021   BUN 20 06/21/2021   BUN 16 07/19/2020   CREATININE 1.30 (H) 06/21/2021   PROT 7.2 06/21/2021   ALBUMIN 3.8 06/21/2021   BILITOT 0.7 06/21/2021   ALKPHOS 83 06/21/2021   AST 24 06/21/2021   ALT 12 06/21/2021  .  Total Discharge time is about 33 minutes  Roxan Hockey M.D on 06/22/2021 at 5:06 PM  Go to [email or web address] -  for contact info  [facility] - Office  [phone]

## 2021-06-22 NOTE — Procedures (Signed)
Patient Name: BOBBIEJO ISHIKAWA  MRN: [id number]  Epilepsy Attending: Lora Havens  Referring Physician/Provider: Roxan Hockey, MD Date: 06/21/2021 Duration: 22.49 mins  Patient history: 88 year old female with syncope.  EEG to evaluate for seizure.  Level of alertness: Awake, asleep  AEDs during EEG study: None  Technical aspects: This EEG study was done with scalp electrodes positioned according to the 10-20 International system of electrode placement. Electrical activity was acquired at a sampling rate of '500Hz'$  and reviewed with a high frequency filter of '70Hz'$  and a low frequency filter of '1Hz'$ . EEG data were recorded continuously and digitally stored.   Description: The posterior dominant rhythm consists of 8-9 Hz activity of moderate voltage (25-35 uV) seen predominantly in posterior head regions, symmetric and reactive to eye opening and eye closing. Sleep was characterized by vertex waves, sleep spindles (12 to 14 Hz), maximal frontocentral region. Hyperventilation and photic stimulation were not performed.     IMPRESSION: This study is within normal limits. No seizures or epileptiform discharges were seen throughout the recording.  Olliver Boyadjian Barbra Sarks

## 2021-06-22 NOTE — TOC Transition Note (Signed)
Transition of Care [facility]) - CM/SW Discharge Note   Patient Details  Name: [name] MRN: [id number] Date of Birth: 1922-03-20  [facility] CM/SW Contact:  Shade Flood, LCSW Phone Number: 06/22/2021, 1:01 PM   Clinical Narrative:     Pt admitted from home. PT recommending HH PT at dc. Spoke with pt's son by phone to review dc planning. He and pt reside together. He assists pt as needed with ADLs. Reviewed PT recommendation for [facility] and son is agreeable. CMS provider options reviewed. Referred to [facility] and they will follow up with pt at home.  There are no other TOC needs for dc.  Expected Discharge Plan: [place] Barriers to Discharge: Barriers Resolved   Patient Goals and CMS Choice Patient states their goals for this hospitalization and ongoing recovery are:: go home CMS Medicare.gov Compare Post Acute Care list provided to:: Patient Represenative (must comment) Choice offered to / list presented to : Adult Children  Expected Discharge Plan and Services Expected Discharge Plan: [place] In-house Referral: Clinical Social Work   Post Acute Care Choice: [place] arrangements for the past 2 months: [place] Expected Discharge Date: 06/22/21                         HH Arranged: PT [place]: [place] Date [place]: 06/22/21   Representative spoke with at [place]: Tommi Rumps  Prior Living Arrangements/Services Living arrangements for the past 2 months: [place] Lives with:: Adult Children Patient language and need for interpreter reviewed:: Yes Do you feel safe going back to the place where you live?: Yes      Need for Family Participation in Patient Care: Yes (Comment) Care giver support system in place?: Yes (comment) Current home services: DME Criminal Activity/Legal Involvement Pertinent to Current Situation/Hospitalization: No - Comment as  needed  Activities of Daily Living Home Assistive Devices/Equipment: Shower chair with back ADL Screening (condition at time of admission) Patient's cognitive ability adequate to safely complete daily activities?: Yes Is the patient deaf or have difficulty hearing?: No Does the patient have difficulty seeing, even when wearing glasses/contacts?: No Does the patient have difficulty concentrating, remembering, or making decisions?: No Patient able to express need for assistance with ADLs?: Yes Does the patient have difficulty dressing or bathing?: Yes Independently performs ADLs?: No Communication: Independent Dressing (OT): Needs assistance Is this a change from baseline?: Change from baseline, expected to last <3days Grooming: Needs assistance Is this a change from baseline?: Change from baseline, expected to last <3 days Feeding: Independent Bathing: Needs assistance Is this a change from baseline?: Change from baseline, expected to last <3 days Toileting: Needs assistance Is this a change from baseline?: Change from baseline, expected to last <3 days In/Out Bed: Needs assistance Is this a change from baseline?: Change from baseline, expected to last <3 days Walks in Home: Needs assistance Is this a change from baseline?: Change from baseline, expected to last <3 days Does the patient have difficulty walking or climbing stairs?: Yes Weakness of Legs: None Weakness of Arms/Hands: None  Permission Sought/Granted Permission sought to share information with : Facility Art therapist granted to share information with : Yes, Verbal Permission Granted     Permission granted to share info w AGENCY: HH        Emotional Assessment Appearance:: Appears stated age     Orientation: :  Oriented to Self Alcohol / Substance Use: Not Applicable Psych Involvement: No (comment)  Admission diagnosis:  Syncope and collapse [R55] Episode of unresponsiveness  [R41.89] Patient Active Problem List   Diagnosis Date Noted   Episode of unresponsiveness 06/21/2021   Fall 06/20/2021   Stage 3a chronic kidney disease ([place]) 02/05/2021   Encounter for general adult medical examination with abnormal findings 02/05/2021   Late onset Alzheimer's dementia without behavioral disturbance ([place]) 04/06/2020   Hypothyroidism 02/10/2020   Renal mass 02/10/2020   Pulmonary nodule 02/10/2020   Iron deficiency anemia 01/26/2020   Carotid disease, bilateral ([place]) 04/19/2014   Mild memory loss 02/15/2011   Pernicious anemia 06/14/2010   Neuroendocrine carcinoid tumor of stomach 09/28/2009   HLD (hyperlipidemia) 04/03/2009   Essential hypertension 04/03/2009   GERD 04/03/2009   PCP:  [name], MD Pharmacy:   [facility], [address] [place] [address] Phone: [phone] Fax: [facility] [place], [place] - [address] [place] [address] Phone: [phone] Fax: [phone]     Social Determinants of Health (SDOH) Interventions    Readmission Risk Interventions     View : No data to display.           Final next level of care: [place] Barriers to Discharge: Barriers Resolved   Patient Goals and CMS Choice Patient states their goals for this hospitalization and ongoing recovery are:: go home CMS Medicare.gov Compare Post Acute Care list provided to:: Patient Represenative (must comment) Choice offered to / list presented to : Adult Children  Discharge Placement                       Discharge Plan and Services In-house Referral: Clinical Social Work   Post Acute Care Choice: Home Health                    HH Arranged: PT [facility] Agency: [place] Date [facility] Agency Contacted: 06/22/21   Representative spoke with at [place]: [place] Determinants of Health (SDOH) Interventions     Readmission Risk  Interventions     View : No data to display.

## 2021-06-22 NOTE — Care Management Obs Status (Signed)
[place] NOTIFICATION   Patient Details  Name: [name] MRN: [id number] Date of Birth: [birth year]   Medicare Observation Status Notification Given:  Yes    [name] 06/22/2021, 3:57 PM

## 2021-06-23 LAB — URINE CULTURE: Culture: >=100000 — AB

## 2021-06-25 ENCOUNTER — Telehealth: Payer: Self-pay | Admitting: *Deleted

## 2021-06-25 NOTE — Telephone Encounter (Signed)
Transition Care Management Unsuccessful Follow-up Telephone Call  Date of discharge and from where:  06-22-21 [name]   Attempts:  1st Attempt  Reason for unsuccessful TCM follow-up call:  Left voice message

## 2021-06-25 NOTE — Telephone Encounter (Signed)
Returning a call. Please give patient spouse Debroah Shuttleworth call on his cell phone [phone].

## 2021-06-26 NOTE — Telephone Encounter (Signed)
Scheduled regular office vist per nurse

## 2021-06-26 NOTE — Telephone Encounter (Signed)
This message was sent to [place] while I was out of office she forwarded to me and its too late to do TOC today please schedule regular follow up NOT TOC

## 2021-06-28 ENCOUNTER — Ambulatory Visit (INDEPENDENT_AMBULATORY_CARE_PROVIDER_SITE_OTHER): Payer: PPO | Admitting: Internal Medicine

## 2021-06-28 ENCOUNTER — Ambulatory Visit (HOSPITAL_COMMUNITY): Admission: RE | Admit: 2021-06-28 | Payer: PPO | Source: Ambulatory Visit

## 2021-06-28 ENCOUNTER — Encounter: Payer: Self-pay | Admitting: Internal Medicine

## 2021-06-28 VITALS — BP 128/78 | HR 81 | Resp 18 | Ht 61.0 in | Wt 99.0 lb

## 2021-06-28 DIAGNOSIS — Z09 Encounter for follow-up examination after completed treatment for conditions other than malignant neoplasm: Secondary | ICD-10-CM | POA: Insufficient documentation

## 2021-06-28 DIAGNOSIS — I10 Essential (primary) hypertension: Secondary | ICD-10-CM | POA: Diagnosis not present

## 2021-06-28 DIAGNOSIS — M19011 Primary osteoarthritis, right shoulder: Secondary | ICD-10-CM | POA: Diagnosis not present

## 2021-06-28 DIAGNOSIS — F0280 Dementia in other diseases classified elsewhere without behavioral disturbance: Secondary | ICD-10-CM | POA: Diagnosis not present

## 2021-06-28 DIAGNOSIS — E039 Hypothyroidism, unspecified: Secondary | ICD-10-CM | POA: Diagnosis not present

## 2021-06-28 DIAGNOSIS — M19012 Primary osteoarthritis, left shoulder: Secondary | ICD-10-CM | POA: Diagnosis not present

## 2021-06-28 DIAGNOSIS — E538 Deficiency of other specified B group vitamins: Secondary | ICD-10-CM | POA: Diagnosis not present

## 2021-06-28 DIAGNOSIS — D509 Iron deficiency anemia, unspecified: Secondary | ICD-10-CM | POA: Diagnosis not present

## 2021-06-28 DIAGNOSIS — I700 Atherosclerosis of aorta: Secondary | ICD-10-CM | POA: Diagnosis not present

## 2021-06-28 DIAGNOSIS — R55 Syncope and collapse: Secondary | ICD-10-CM

## 2021-06-28 DIAGNOSIS — I6523 Occlusion and stenosis of bilateral carotid arteries: Secondary | ICD-10-CM | POA: Diagnosis not present

## 2021-06-28 DIAGNOSIS — N1831 Chronic kidney disease, stage 3a: Secondary | ICD-10-CM | POA: Diagnosis not present

## 2021-06-28 DIAGNOSIS — Z7982 Long term (current) use of aspirin: Secondary | ICD-10-CM | POA: Diagnosis not present

## 2021-06-28 DIAGNOSIS — E785 Hyperlipidemia, unspecified: Secondary | ICD-10-CM | POA: Diagnosis not present

## 2021-06-28 DIAGNOSIS — K5790 Diverticulosis of intestine, part unspecified, without perforation or abscess without bleeding: Secondary | ICD-10-CM | POA: Diagnosis not present

## 2021-06-28 DIAGNOSIS — I1310 Hypertensive heart and chronic kidney disease without heart failure, with stage 1 through stage 4 chronic kidney disease, or unspecified chronic kidney disease: Secondary | ICD-10-CM | POA: Diagnosis not present

## 2021-06-28 DIAGNOSIS — Z9181 History of falling: Secondary | ICD-10-CM | POA: Diagnosis not present

## 2021-06-28 DIAGNOSIS — D631 Anemia in chronic kidney disease: Secondary | ICD-10-CM | POA: Diagnosis not present

## 2021-06-28 DIAGNOSIS — K219 Gastro-esophageal reflux disease without esophagitis: Secondary | ICD-10-CM | POA: Diagnosis not present

## 2021-06-28 DIAGNOSIS — I082 Rheumatic disorders of both aortic and tricuspid valves: Secondary | ICD-10-CM | POA: Diagnosis not present

## 2021-06-28 DIAGNOSIS — G301 Alzheimer's disease with late onset: Secondary | ICD-10-CM | POA: Diagnosis not present

## 2021-06-28 NOTE — Patient Instructions (Signed)
Please take Valsartan 20 mg instead of 40 mg.  Please continue taking other medications as prescribed.  You are being referred to [facility] Neurology.

## 2021-06-28 NOTE — Assessment & Plan Note (Signed)
Carotid artery US showed - Severe (70-99%) stenosis proximal right internal carotid artery secondary to bulky heterogeneous atherosclerotic plaque. Plaque burden is largely in the carotid bifurcation and extending into the internal and external carotid arteries. On aspirin and statin Referred to neurology She does not want any aggressive intervention

## 2021-06-28 NOTE — Progress Notes (Signed)
Established Patient Office Visit  Subjective:  Patient ID: [name], female    DOB: 09/23/1922  Age: 88 y.o. MRN: [id number]  CC:  Chief Complaint  Patient presents with   Follow-up    ER follow up pt went to ER 06-21-21 ran test because she passed out didn't find anything pt still having lower back pain     HPI [name] is a 88 y.o. female with past medical history of HTN, benign neuroendocrine carcinoid tumor of stomach, iron deficiency anemia, vitamin B12 deficiency and malnutrition who presents for f/u after recent hospitalization for syncope.  Patient presented to the ED today with her son -Son stated they were both sitting in car and he was working on computer when he looked over he found pt to be slumped over and white foam coming from the mouth; pt was not alert  Son drove to hospital and pt assisted onto stretcher; pt was alert to name and had an incontinence of urine; -Code stroke and neurology consult in the ED was activated -CT head without acute findings -Carotid artery Dopplers with severe stenosis - She is on Aspirin and statin. Her son denied aggressive intervention for it.  She was treated for UTI with antibiotic. She denies any dysuria or hematuria. Denies any fever, chills, nausea, vomiting, abdominal pain, flank pain or AMS. She has been back to her baseline mental status now according to her son.    Past Medical History:  Diagnosis Date   Adult BMI 19-24 kg/sq m 2008 125 lbs   Anemia    Pernicious 2008-HB 12 MCV 119.5; JAN 2012 HB 13.2 MCV 97.6   Carcinoid tumor of stomach 2008   Diverticulosis    GERD (gastroesophageal reflux disease)    Hiatal hernia    HTN (hypertension)    Hyperlipidemia    Hypertension    Phreesia 02/07/2020   Mild memory loss 02/15/2011   Near syncope 04/18/2014   Pernicious anemia    Pernicious anemia 06/14/2010   Wrist fracture, left 2008    Past Surgical History:  Procedure Laterality Date   COLONOSCOPY  2003 LS    DC/[place] Diverticulosis   EUS  2008 DJ   FNA NO CARCINOID   EUS  JAN 2009   NL EXAM, NO Bx   EUS  [facility] 2011 WO   CARCINOID   UPPER GASTROINTESTINAL ENDOSCOPY  APR 2008 [facility]   CARCIONOID   UPPER GASTROINTESTINAL ENDOSCOPY  FEB 2011 [facility]   CARCINOID/mild anemia/large hiatal hernia    Family History  Problem Relation Age of Onset   Cancer Mother    Heart disease Brother    Kidney disease Son    Colon cancer Neg Hx    Colon polyps Neg Hx     Social History   Socioeconomic History   Marital status: Widowed    Spouse name: Not on file   Number of children: 1   Years of education: 66   Highest education level: Not on file  Occupational History   Not on file  Tobacco Use   Smoking status: Never   Smokeless tobacco: Never  Vaping Use   Vaping Use: Never used  Substance and Sexual Activity   Alcohol use: No   Drug use: No   Sexual activity: Not Currently    Birth control/protection: Post-menopausal  Other Topics Concern   Not on file  Social History Narrative   Live with son Ray   Social Determinants of Health   Financial  Resource Strain: Low Risk  (02/22/2021)   Overall Financial Resource Strain (CARDIA)    Difficulty of Paying Living Expenses: Not hard at all  Food Insecurity: No Food Insecurity (02/22/2021)   Hunger Vital Sign    Worried About Running Out of Food in the Last Year: Never true    Ran Out of Food in the Last Year: Never true  Transportation Needs: No Transportation Needs (02/22/2021)   PRAPARE - Hydrologist (Medical): No    Lack of Transportation (Non-Medical): No  Physical Activity: Inactive (02/22/2021)   Exercise Vital Sign    Days of Exercise per Week: 0 days    Minutes of Exercise per Session: 0 min  Stress: No Stress Concern Present (02/22/2021)   [place]    Feeling of Stress : Not at all  Social Connections: Moderately Integrated (02/22/2021)   Social  Connection and Isolation Panel [NHANES]    Frequency of Communication with Friends and Family: Three times a week    Frequency of Social Gatherings with Friends and Family: More than three times a week    Attends Religious Services: More than 4 times per year    Active Member of [employer] or Organizations: Yes    Attends Archivist Meetings: Never    Marital Status: Widowed  Intimate Partner Violence: Not At Risk (02/22/2021)   Humiliation, Afraid, Rape, and Kick questionnaire    Fear of Current or Ex-Partner: No    Emotionally Abused: No    Physically Abused: No    Sexually Abused: No    Outpatient Medications Prior to Visit  Medication Sig Dispense Refill   acetaminophen (TYLENOL) 500 MG tablet Take 500 mg by mouth every 6 (six) hours as needed. For pain     aspirin EC 81 MG tablet Take 1 tablet (81 mg total) by mouth daily with breakfast. 30 tablet 2   donepezil (ARICEPT) 5 MG tablet TAKE 1 TABLET BY MOUTH AT BEDTIME 30 tablet 5   felodipine (PLENDIL) 5 MG 24 hr tablet Take 1 tablet (5 mg total) by mouth daily. 90 tablet 1   levothyroxine (SYNTHROID) 25 MCG tablet Take 1 tablet (25 mcg total) by mouth daily before breakfast. 90 tablet 1   simvastatin (ZOCOR) 40 MG tablet Take 1 tablet (40 mg total) by mouth daily. 90 tablet 3   valsartan (DIOVAN) 40 MG tablet Take 1 tablet (40 mg total) by mouth daily. (Patient taking differently: Take 20 mg by mouth daily.) 90 tablet 1   No facility-administered medications prior to visit.    Allergies  Allergen Reactions   Codeine     ROS Review of Systems  Constitutional:  Negative for chills and fever.  HENT:  Negative for congestion, sinus pressure, sinus pain and sore throat.   Eyes:  Negative for pain and discharge.  Respiratory:  Negative for cough and shortness of breath.   Cardiovascular:  Negative for chest pain and palpitations.  Gastrointestinal:  Negative for abdominal pain, diarrhea, nausea and vomiting.  Endocrine:  Negative for polydipsia and polyuria.  Genitourinary:  Negative for dysuria and hematuria.  Musculoskeletal:  Positive for back pain. Negative for neck pain and neck stiffness.  Skin:  Negative for rash.  Neurological:  Positive for syncope. Negative for dizziness, weakness and headaches.  Psychiatric/Behavioral:  Negative for agitation and behavioral problems.       Objective:    Physical Exam Vitals reviewed.  Constitutional:  General: She is not in acute distress.    Appearance: She is not diaphoretic.  HENT:     Head: Normocephalic and atraumatic.     Nose: Nose normal. No congestion.     Mouth/Throat:     Mouth: Mucous membranes are moist.     Pharynx: No posterior oropharyngeal erythema.  Eyes:     General: No scleral icterus.    Extraocular Movements: Extraocular movements intact.  Cardiovascular:     Rate and Rhythm: Normal rate and regular rhythm.     Pulses: Normal pulses.     Heart sounds: Normal heart sounds. No murmur heard. Pulmonary:     Breath sounds: Normal breath sounds. No wheezing or rales.  Abdominal:     Palpations: Abdomen is soft.     Tenderness: There is no abdominal tenderness.  Musculoskeletal:     Cervical back: Neck supple. No tenderness.     Right lower leg: Edema (1+) present.     Left lower leg: Edema (1+) present.  Skin:    General: Skin is warm.     Findings: No rash.  Neurological:     General: No focal deficit present.     Mental Status: She is alert and oriented to person, place, and time.     Cranial Nerves: No cranial nerve deficit.     Sensory: No sensory deficit.     Motor: No weakness.  Psychiatric:        Mood and Affect: Mood normal.        Behavior: Behavior normal.     BP 128/78 (BP Location: Right Arm, Patient Position: Sitting, Cuff Size: Normal)   Pulse 81   Resp 18   Ht _0  (1.549 m)   Wt 99 lb (44.9 kg)   SpO2 97%   BMI 18.71 kg/m  Wt Readings from Last 3 Encounters:  06/28/21 99 lb (44.9 kg)   06/20/21 98 lb 12.8 oz (44.8 kg)  03/05/21 95 lb 14.4 oz (43.5 kg)    Lab Results  Component Value Date   TSH 3.880 07/19/2020   Lab Results  Component Value Date   WBC 6.9 06/21/2021   HGB 13.6 06/21/2021   HCT 40.0 06/21/2021   MCV 101.7 (H) 06/21/2021   PLT 111 (L) 06/21/2021   Lab Results  Component Value Date   NA 141 06/21/2021   K 3.7 06/21/2021   CO2 25 06/21/2021   GLUCOSE 117 (H) 06/21/2021   BUN 20 06/21/2021   CREATININE 1.30 (H) 06/21/2021   BILITOT 0.7 06/21/2021   ALKPHOS 83 06/21/2021   AST 24 06/21/2021   ALT 12 06/21/2021   PROT 7.2 06/21/2021   ALBUMIN 3.8 06/21/2021   CALCIUM 8.8 (L) 06/21/2021   ANIONGAP 5 06/21/2021   EGFR 50 (L) 07/19/2020   No results found for: "CHOL" No results found for: "HDL" No results found for: "LDLCALC" No results found for: "TRIG" No results found for: "CHOLHDL" No results found for: "HGBA1C"    Assessment & Plan:   Problem List Items Addressed This Visit       Cardiovascular and Mediastinum   Essential hypertension    BP Readings from Last 1 Encounters:  06/28/21 128/78  Usually well-controlled with Felodipine and Valsartan Decreased dose of Valsartan to 20 mg QD to avoid risk of hypotension Counseled for compliance with the medications Advised DASH diet and moderate exercise/walking as tolerated      Carotid disease, bilateral ([place])    Carotid artery US showed -  Severe (70-99%) stenosis proximal right internal carotid artery secondary to bulky heterogeneous atherosclerotic plaque. Plaque burden is largely in the carotid bifurcation and extending into the internal and external carotid arteries. On aspirin and statin Referred to neurology She does not want any aggressive intervention      Relevant Orders   Ambulatory referral to Neurology     Other   Syncope and collapse    Unclear etiology Could be due to dehydration and/or carotid artery stenosis  Advised to maintain adequate hydration On  aspirin and statin for carotid artery stenosis Referred to neurology      Relevant Orders   Ambulatory referral to Neurology   Hospital discharge follow-up - Primary    Syncope and collapse, however mental status is at baseline now Hospital chart reviewed including discharge summary Medications reconciled and reviewed with the patient        No orders of the defined types were placed in this encounter.   Follow-up: Return if symptoms worsen or fail to improve.    [name], MD

## 2021-06-28 NOTE — Assessment & Plan Note (Addendum)
BP Readings from Last 1 Encounters:  06/28/21 128/78   Usually well-controlled with Felodipine and Valsartan Decreased dose of Valsartan to 20 mg QD to avoid risk of hypotension Counseled for compliance with the medications Advised DASH diet and moderate exercise/walking as tolerated

## 2021-06-28 NOTE — Assessment & Plan Note (Signed)
Unclear etiology Could be due to dehydration and/or carotid artery stenosis  Advised to maintain adequate hydration On aspirin and statin for carotid artery stenosis Referred to neurology

## 2021-06-28 NOTE — Assessment & Plan Note (Signed)
Syncope and collapse, however mental status is at baseline now Hospital chart reviewed including discharge summary Medications reconciled and reviewed with the patient

## 2021-06-29 ENCOUNTER — Telehealth: Payer: Self-pay | Admitting: Internal Medicine

## 2021-06-29 ENCOUNTER — Ambulatory Visit (HOSPITAL_COMMUNITY): Payer: PPO

## 2021-06-29 NOTE — Telephone Encounter (Signed)
Tressia Danas, PT with [facility] called in regard to patient.  Seeking verbal orders for general weakness   1 week 1 2 week 2 1 week 1   Call back info  [phone]

## 2021-06-29 NOTE — Telephone Encounter (Signed)
Verbal orders given  

## 2021-07-10 DIAGNOSIS — E039 Hypothyroidism, unspecified: Secondary | ICD-10-CM | POA: Diagnosis not present

## 2021-07-10 DIAGNOSIS — M19012 Primary osteoarthritis, left shoulder: Secondary | ICD-10-CM | POA: Diagnosis not present

## 2021-07-10 DIAGNOSIS — I1310 Hypertensive heart and chronic kidney disease without heart failure, with stage 1 through stage 4 chronic kidney disease, or unspecified chronic kidney disease: Secondary | ICD-10-CM | POA: Diagnosis not present

## 2021-07-10 DIAGNOSIS — Z9181 History of falling: Secondary | ICD-10-CM | POA: Diagnosis not present

## 2021-07-10 DIAGNOSIS — I082 Rheumatic disorders of both aortic and tricuspid valves: Secondary | ICD-10-CM | POA: Diagnosis not present

## 2021-07-10 DIAGNOSIS — E785 Hyperlipidemia, unspecified: Secondary | ICD-10-CM | POA: Diagnosis not present

## 2021-07-10 DIAGNOSIS — I6523 Occlusion and stenosis of bilateral carotid arteries: Secondary | ICD-10-CM | POA: Diagnosis not present

## 2021-07-10 DIAGNOSIS — F0280 Dementia in other diseases classified elsewhere without behavioral disturbance: Secondary | ICD-10-CM | POA: Diagnosis not present

## 2021-07-10 DIAGNOSIS — E538 Deficiency of other specified B group vitamins: Secondary | ICD-10-CM | POA: Diagnosis not present

## 2021-07-10 DIAGNOSIS — K5790 Diverticulosis of intestine, part unspecified, without perforation or abscess without bleeding: Secondary | ICD-10-CM | POA: Diagnosis not present

## 2021-07-10 DIAGNOSIS — M19011 Primary osteoarthritis, right shoulder: Secondary | ICD-10-CM | POA: Diagnosis not present

## 2021-07-10 DIAGNOSIS — N1831 Chronic kidney disease, stage 3a: Secondary | ICD-10-CM | POA: Diagnosis not present

## 2021-07-10 DIAGNOSIS — D509 Iron deficiency anemia, unspecified: Secondary | ICD-10-CM | POA: Diagnosis not present

## 2021-07-10 DIAGNOSIS — D631 Anemia in chronic kidney disease: Secondary | ICD-10-CM | POA: Diagnosis not present

## 2021-07-10 DIAGNOSIS — K219 Gastro-esophageal reflux disease without esophagitis: Secondary | ICD-10-CM | POA: Diagnosis not present

## 2021-07-10 DIAGNOSIS — Z7982 Long term (current) use of aspirin: Secondary | ICD-10-CM | POA: Diagnosis not present

## 2021-07-10 DIAGNOSIS — G301 Alzheimer's disease with late onset: Secondary | ICD-10-CM | POA: Diagnosis not present

## 2021-07-10 DIAGNOSIS — I700 Atherosclerosis of aorta: Secondary | ICD-10-CM | POA: Diagnosis not present

## 2021-07-12 DIAGNOSIS — G301 Alzheimer's disease with late onset: Secondary | ICD-10-CM | POA: Diagnosis not present

## 2021-07-12 DIAGNOSIS — I1310 Hypertensive heart and chronic kidney disease without heart failure, with stage 1 through stage 4 chronic kidney disease, or unspecified chronic kidney disease: Secondary | ICD-10-CM | POA: Diagnosis not present

## 2021-07-12 DIAGNOSIS — I700 Atherosclerosis of aorta: Secondary | ICD-10-CM

## 2021-07-12 DIAGNOSIS — E538 Deficiency of other specified B group vitamins: Secondary | ICD-10-CM

## 2021-07-12 DIAGNOSIS — M19012 Primary osteoarthritis, left shoulder: Secondary | ICD-10-CM

## 2021-07-12 DIAGNOSIS — M19011 Primary osteoarthritis, right shoulder: Secondary | ICD-10-CM

## 2021-07-12 DIAGNOSIS — K219 Gastro-esophageal reflux disease without esophagitis: Secondary | ICD-10-CM

## 2021-07-12 DIAGNOSIS — I6523 Occlusion and stenosis of bilateral carotid arteries: Secondary | ICD-10-CM | POA: Diagnosis not present

## 2021-07-12 DIAGNOSIS — D631 Anemia in chronic kidney disease: Secondary | ICD-10-CM

## 2021-07-12 DIAGNOSIS — K5790 Diverticulosis of intestine, part unspecified, without perforation or abscess without bleeding: Secondary | ICD-10-CM

## 2021-07-12 DIAGNOSIS — D509 Iron deficiency anemia, unspecified: Secondary | ICD-10-CM

## 2021-07-12 DIAGNOSIS — F0280 Dementia in other diseases classified elsewhere without behavioral disturbance: Secondary | ICD-10-CM | POA: Diagnosis not present

## 2021-07-12 DIAGNOSIS — I082 Rheumatic disorders of both aortic and tricuspid valves: Secondary | ICD-10-CM

## 2021-07-12 DIAGNOSIS — N1831 Chronic kidney disease, stage 3a: Secondary | ICD-10-CM

## 2021-07-13 ENCOUNTER — Inpatient Hospital Stay (HOSPITAL_COMMUNITY): Payer: PPO | Attending: Oncology

## 2021-07-16 DIAGNOSIS — I739 Peripheral vascular disease, unspecified: Secondary | ICD-10-CM | POA: Diagnosis not present

## 2021-07-16 DIAGNOSIS — B351 Tinea unguium: Secondary | ICD-10-CM | POA: Diagnosis not present

## 2021-07-27 ENCOUNTER — Ambulatory Visit (HOSPITAL_COMMUNITY): Payer: PPO

## 2021-08-21 ENCOUNTER — Inpatient Hospital Stay: Payer: PPO | Attending: Hematology

## 2021-08-24 ENCOUNTER — Ambulatory Visit (HOSPITAL_COMMUNITY): Payer: PPO

## 2021-08-30 IMAGING — CT CT HEAD W/O CM
3 series · 15 of 47 positions shown, 18 images · non-contrast
Comparison: April 18, 2015

CLINICAL DATA: Syncope

EXAM:
CT HEAD WITHOUT CONTRAST
TECHNIQUE: Contiguous axial images were obtained from the base of the skull
through the vertex without intravenous contrast.

[Series 2: head w o · axial · 0.47mm/px · z∈[+50,+175]mm · 9 of 31 slices shown, 12 images]
[im 3/31  brain]
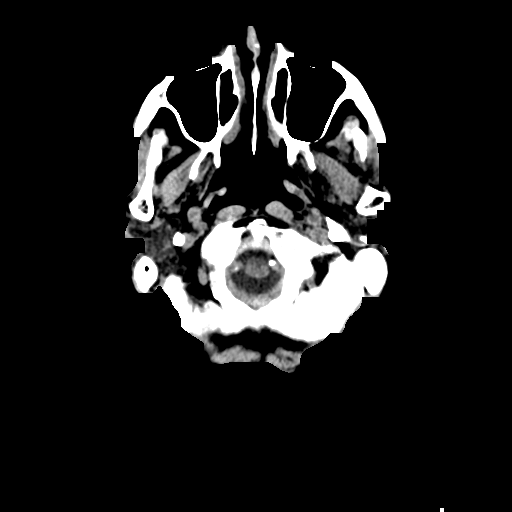
[im 3/31  bone]
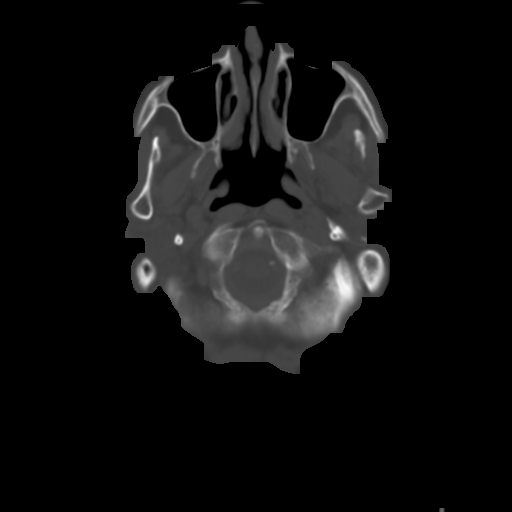
[im 6/31  brain]
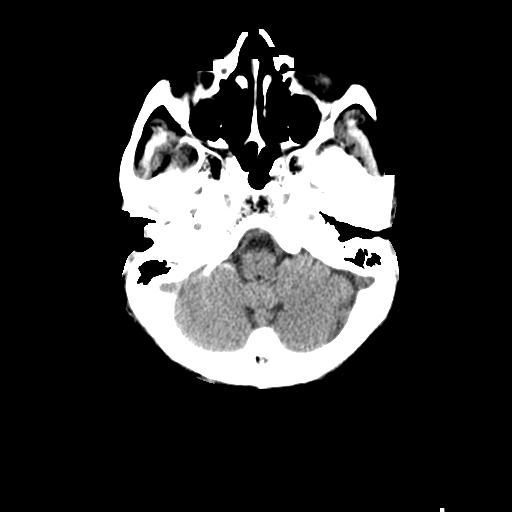
[im 9/31  brain]
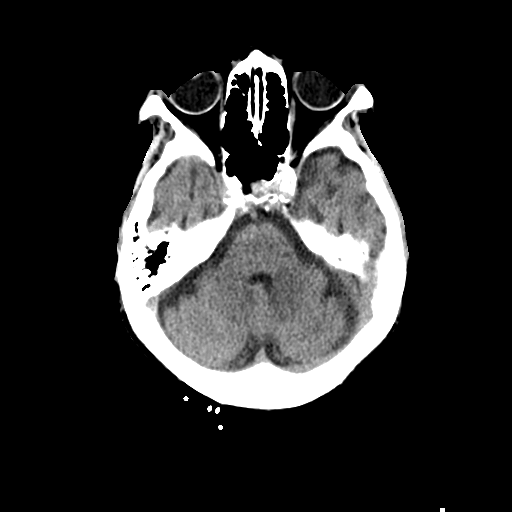
[im 12/31  brain]
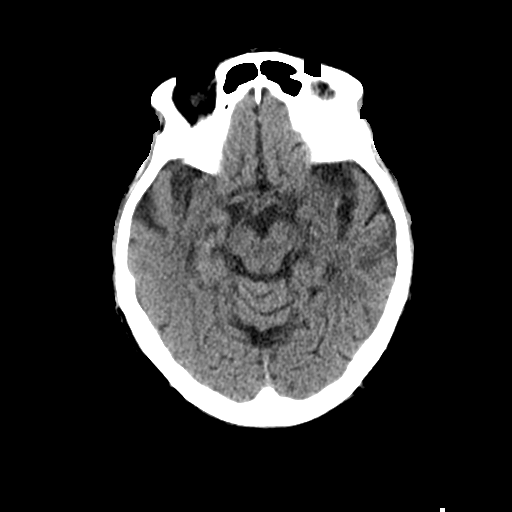
[im 16/31  brain]
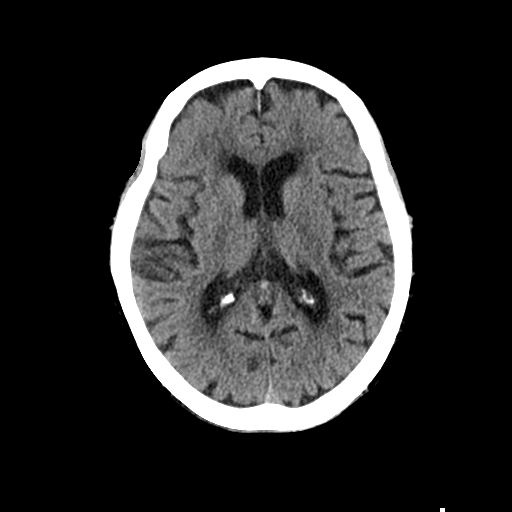
[im 16/31  bone]
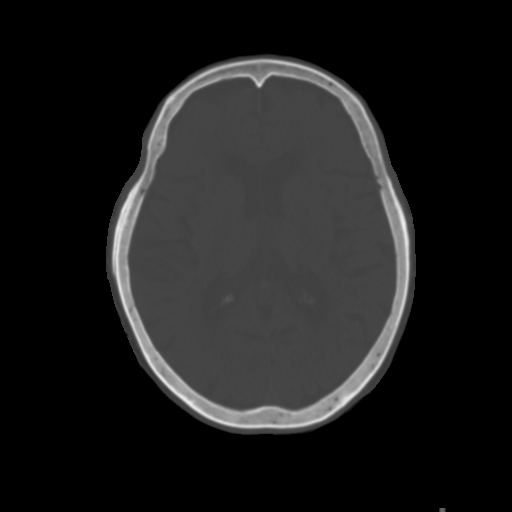
[im 19/31  brain]
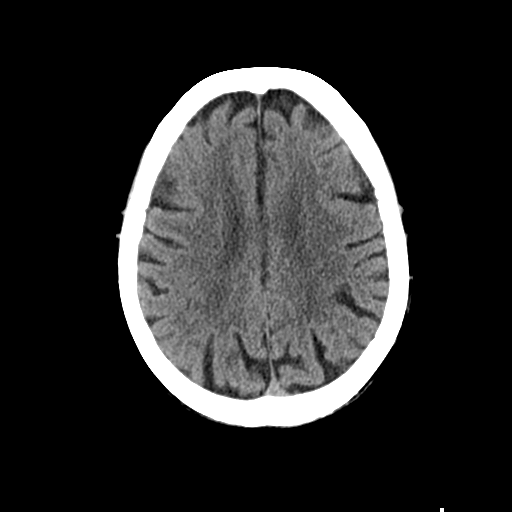
[im 22/31  brain]
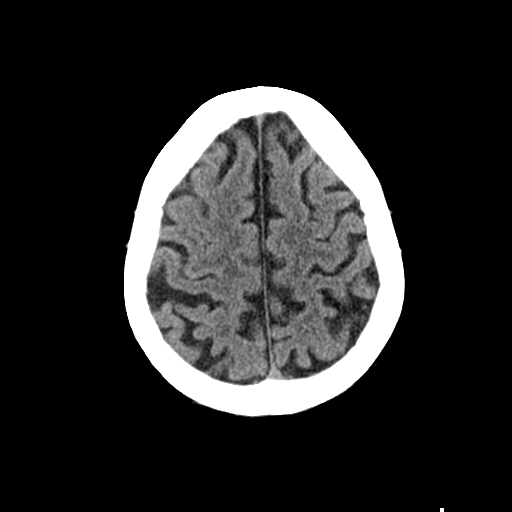
[im 25/31  brain]
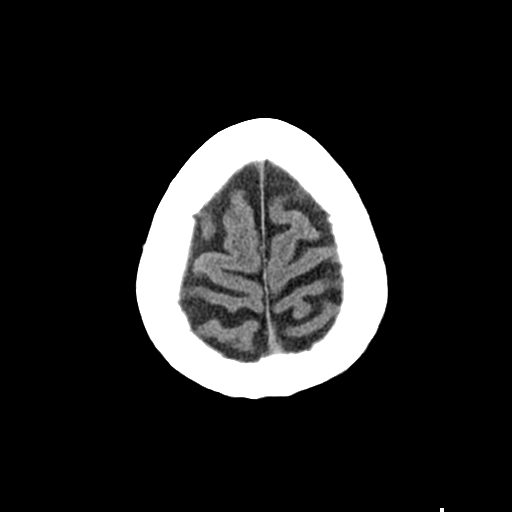
[im 28/31  brain]
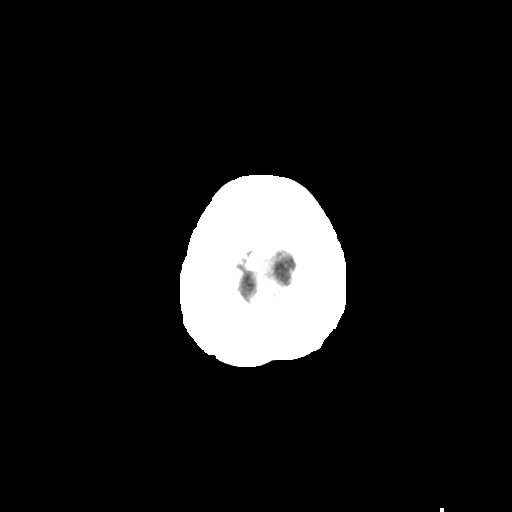
[im 28/31  bone]
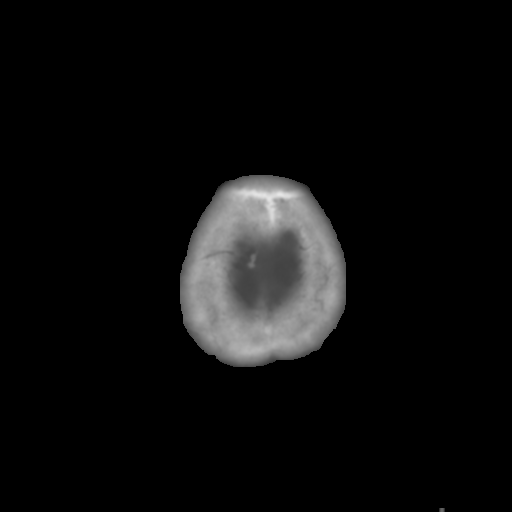

[Series 4: coronal soft · coronal · 0.31mm/px · 3 of 66 slices shown]
[im 22/66  brain]
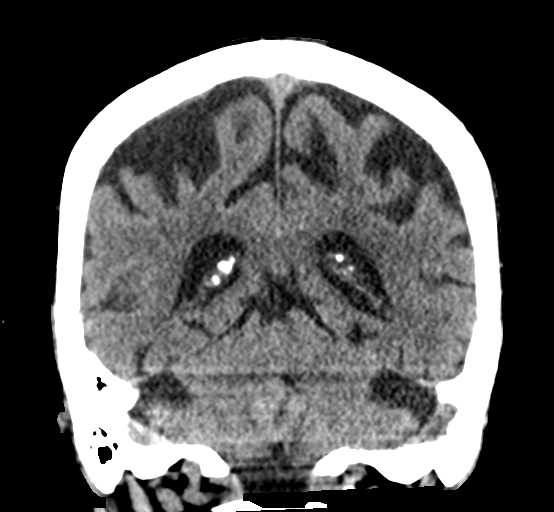
[im 29/66  brain]
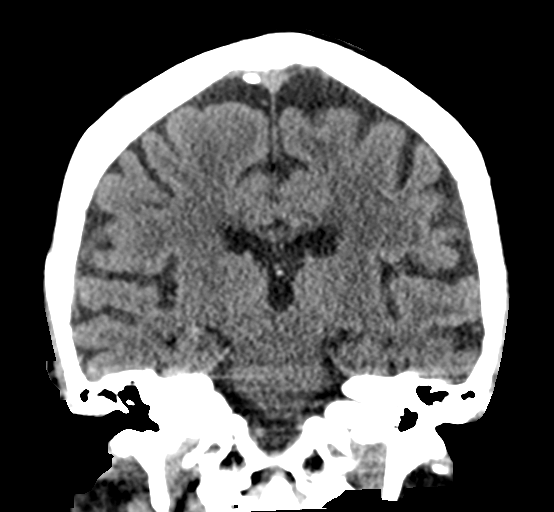
[im 37/66  brain]
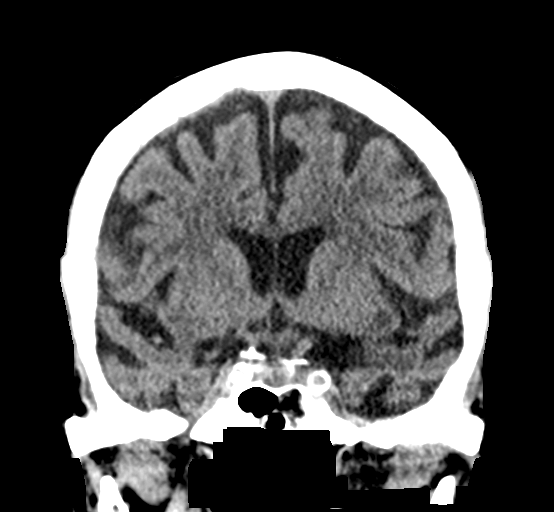

[Series 5: sagittal soft · sagittal · 0.31mm/px · 3 of 53 slices shown]
[im 18/53  brain]
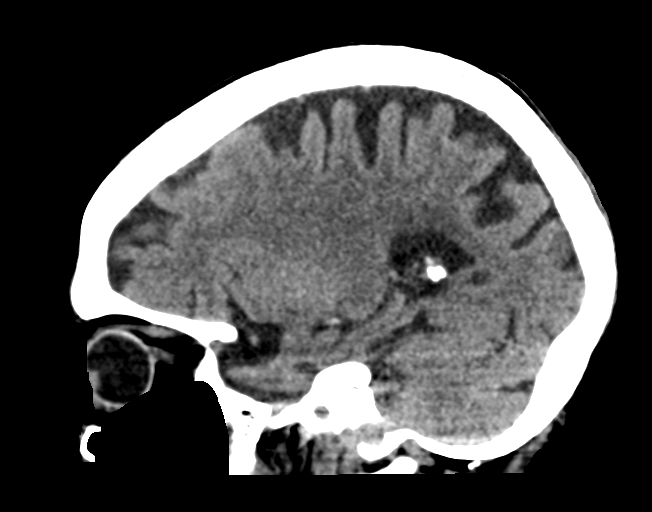
[im 27/53  brain]
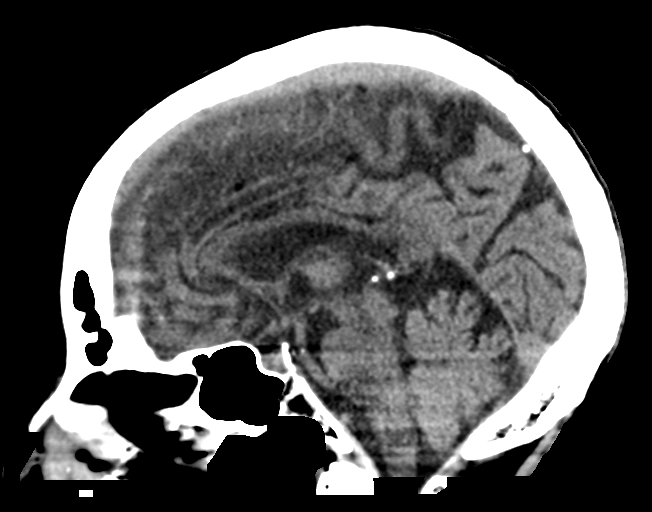
[im 35/53  brain]
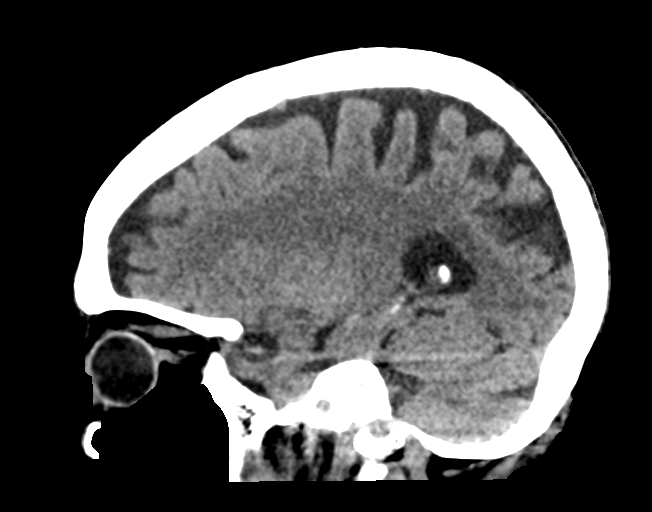

[15 of 47 positions shown; findings below may reference images not displayed]

FINDINGS: Brain: No evidence of acute territorial infarction, hemorrhage,
hydrocephalus,extra-axial collection or mass lesion/mass effect.
There is dilatation the ventricles and sulci consistent with
age-related atrophy. Low-attenuation changes in the deep white
matter consistent with small vessel ischemia.

Vascular: No hyperdense vessel or unexpected calcification.
Calcifications are seen within the internal carotid artery.

Skull: The skull is intact. No fracture or focal lesion identified.

Sinuses/Orbits: The visualized paranasal sinuses and mastoid air
cells are clear. The orbits and globes intact.

Other: None
IMPRESSION: No acute intracranial abnormality.

Findings consistent with age related atrophy and chronic small
vessel ischemia

## 2021-08-30 IMAGING — DX DG CHEST 1V
1 series · 1 of 1 positions shown · non-contrast
Comparison: Radiographs 04/19/2015 and 04/18/2014.

CLINICAL DATA: Fall. Pre syncopal episode.

EXAM:
CHEST  1 VIEW

[chest ap]
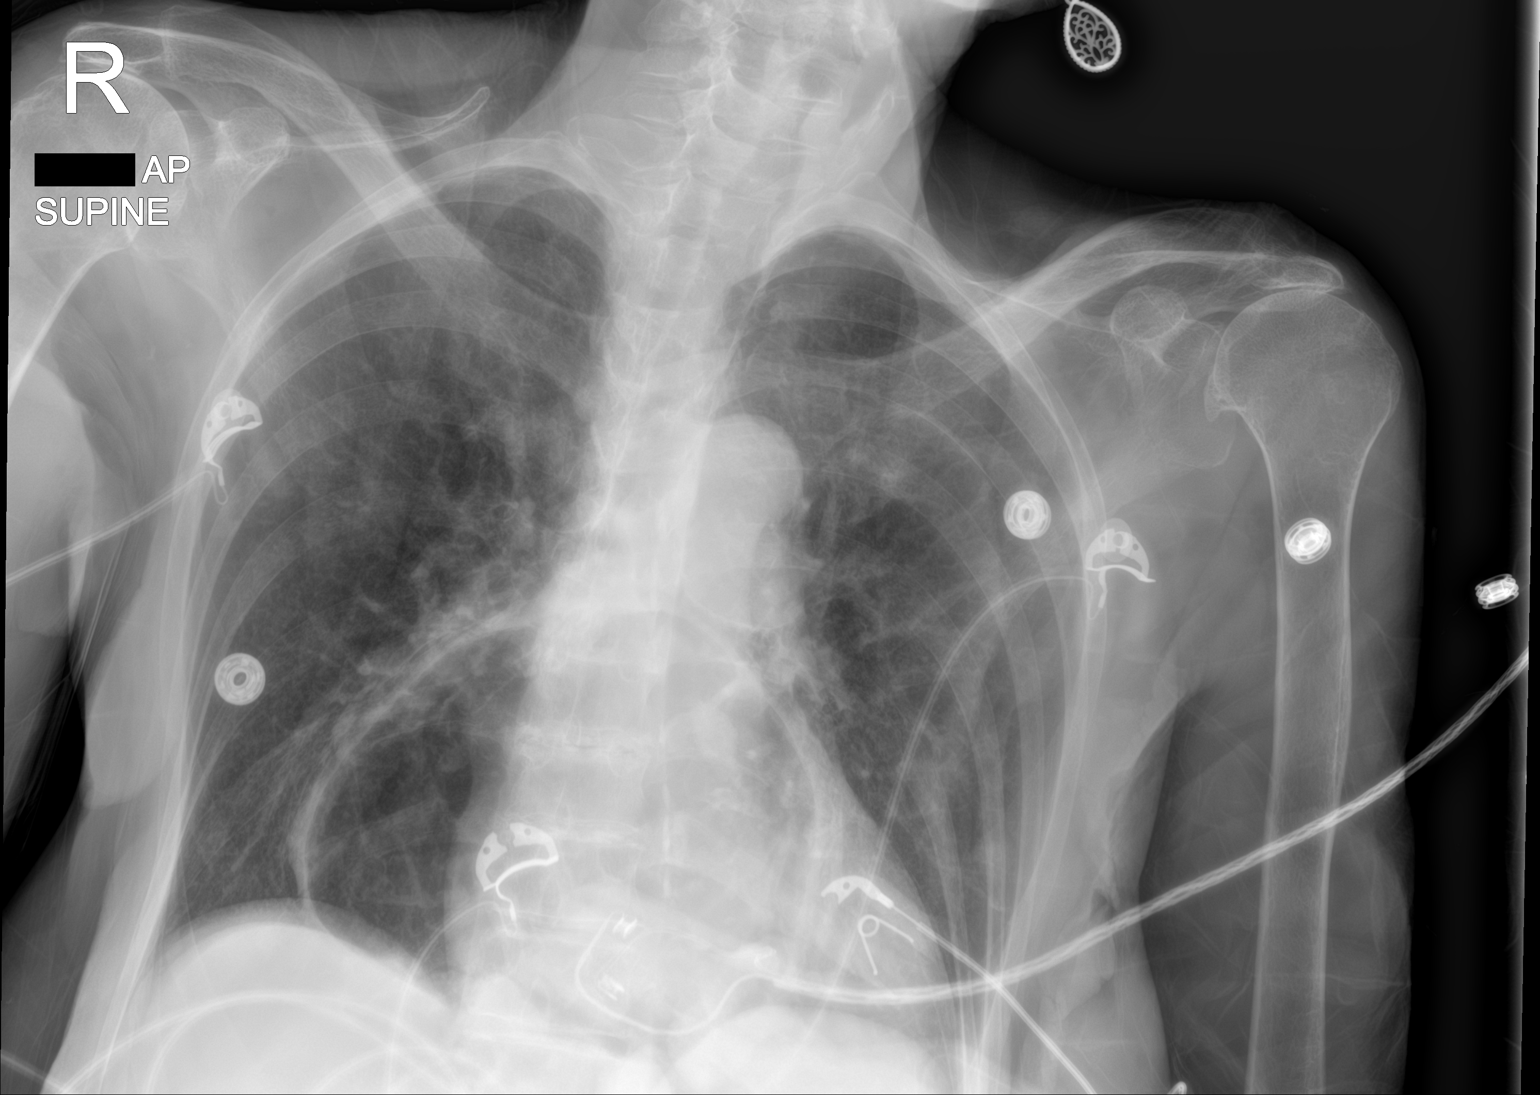

[1 of 1 positions shown; findings below may reference images not displayed]

FINDINGS: 2135 hours. The heart size and mediastinal contours are stable.
There is a large hiatal hernia. Mild chronic atelectasis or scarring
at both lung bases is similar to the previous studies. No airspace
disease, edema, pleural effusion or pneumothorax. No acute fractures
are seen. Telemetry leads overlie the chest.
IMPRESSION: 1. Stable chest. No active cardiopulmonary process. Large hiatal
hernia.
2. Stable bibasilar scarring or atelectasis.

## 2021-09-03 ENCOUNTER — Inpatient Hospital Stay: Payer: PPO

## 2021-09-03 ENCOUNTER — Ambulatory Visit (HOSPITAL_COMMUNITY)
Admission: RE | Admit: 2021-09-03 | Discharge: 2021-09-03 | Disposition: A | Discharge status: Home / Self Care | Payer: PPO | Source: Ambulatory Visit | Attending: Physician Assistant | Admitting: Physician Assistant

## 2021-09-03 ENCOUNTER — Ambulatory Visit (HOSPITAL_COMMUNITY): Payer: PPO

## 2021-09-03 DIAGNOSIS — N2889 Other specified disorders of kidney and ureter: Secondary | ICD-10-CM | POA: Diagnosis not present

## 2021-09-03 DIAGNOSIS — D3A092 Benign carcinoid tumor of the stomach: Secondary | ICD-10-CM | POA: Diagnosis not present

## 2021-09-03 DIAGNOSIS — C642 Malignant neoplasm of left kidney, except renal pelvis: Secondary | ICD-10-CM | POA: Diagnosis not present

## 2021-09-03 DIAGNOSIS — N281 Cyst of kidney, acquired: Secondary | ICD-10-CM | POA: Diagnosis not present

## 2021-09-03 DIAGNOSIS — C7A092 Malignant carcinoid tumor of the stomach: Secondary | ICD-10-CM | POA: Diagnosis not present

## 2021-09-03 DIAGNOSIS — D509 Iron deficiency anemia, unspecified: Secondary | ICD-10-CM | POA: Diagnosis not present

## 2021-09-03 DIAGNOSIS — D510 Vitamin B12 deficiency anemia due to intrinsic factor deficiency: Secondary | ICD-10-CM | POA: Insufficient documentation

## 2021-09-03 LAB — CBC WITH DIFFERENTIAL/PLATELET
Abs Immature Granulocytes: 0.01 10*3/uL (ref 0.00–0.07)
Basophils Absolute: 0 10*3/uL (ref 0.0–0.1)
Basophils Relative: 0 %
Eosinophils Absolute: 0.1 10*3/uL (ref 0.0–0.5)
Eosinophils Relative: 2 %
HCT: 36.5 % (ref 36.0–46.0)
Hemoglobin: 11.5 g/dL — ABNORMAL LOW (ref 12.0–15.0)
Immature Granulocytes: 0 %
Lymphocytes Relative: 16 %
Lymphs Abs: 0.8 10*3/uL (ref 0.7–4.0)
MCH: 32.8 pg (ref 26.0–34.0)
MCHC: 31.5 g/dL (ref 30.0–36.0)
MCV: 104 fL — ABNORMAL HIGH (ref 80.0–100.0)
Monocytes Absolute: 0.3 10*3/uL (ref 0.1–1.0)
Monocytes Relative: 7 %
Neutro Abs: 3.5 10*3/uL (ref 1.7–7.7)
Neutrophils Relative %: 75 %
Platelets: 147 10*3/uL — ABNORMAL LOW (ref 150–400)
RBC: 3.51 MIL/uL — ABNORMAL LOW (ref 3.87–5.11)
RDW: 12.6 % (ref 11.5–15.5)
WBC: 4.6 10*3/uL (ref 4.0–10.5)
nRBC: 0 % (ref 0.0–0.2)

## 2021-09-03 LAB — IRON AND TIBC
Iron: 78 ug/dL (ref 28–170)
Saturation Ratios: 28 % (ref 10.4–31.8)
TIBC: 284 ug/dL (ref 250–450)
UIBC: 206 ug/dL

## 2021-09-03 LAB — COMPREHENSIVE METABOLIC PANEL
ALT: 10 U/L (ref 0–44)
AST: 21 U/L (ref 15–41)
Albumin: 3.9 g/dL (ref 3.5–5.0)
Alkaline Phosphatase: 82 U/L (ref 38–126)
Anion gap: 6 (ref 5–15)
BUN: 24 mg/dL — ABNORMAL HIGH (ref 8–23)
CO2: 26 mmol/L (ref 22–32)
Calcium: 9.2 mg/dL (ref 8.9–10.3)
Chloride: 113 mmol/L — ABNORMAL HIGH (ref 98–111)
Creatinine, Ser: 1.19 mg/dL — ABNORMAL HIGH (ref 0.44–1.00)
GFR, Estimated: 42 mL/min — ABNORMAL LOW (ref >60)
Glucose, Bld: 96 mg/dL (ref 70–99)
Potassium: 3.9 mmol/L (ref 3.5–5.1)
Sodium: 145 mmol/L (ref 135–145)
Total Bilirubin: 0.8 mg/dL (ref 0.3–1.2)
Total Protein: 7.2 g/dL (ref 6.5–8.1)

## 2021-09-03 LAB — LACTATE DEHYDROGENASE: LDH: 166 U/L (ref 98–192)

## 2021-09-03 LAB — VITAMIN B12: Vitamin B-12: 577 pg/mL (ref 180–914)

## 2021-09-03 LAB — FERRITIN: Ferritin: 70 ng/mL (ref 11–307)

## 2021-09-03 MED ORDER — IOHEXOL 300 MG/ML  SOLN
100.0000 mL | Freq: Once | INTRAMUSCULAR | Status: AC | PRN
  Administered 2021-09-03: 75 mL via INTRAVENOUS

## 2021-09-04 LAB — CHROMOGRANIN A: Chromogranin A (ng/mL): 713.7 ng/mL — ABNORMAL HIGH (ref 0.0–101.8)

## 2021-09-06 LAB — METHYLMALONIC ACID, SERUM: Methylmalonic Acid, Quantitative: 235 nmol/L (ref 0–378)

## 2021-09-07 ENCOUNTER — Ambulatory Visit: Payer: PPO | Admitting: Physician Assistant

## 2021-09-10 NOTE — Progress Notes (Unsigned)
[facility] [place], [place] [address]   CLINIC:  Medical Oncology/Hematology  PCP:  [name], MD [address] [place] [address] [phone]   REASON FOR VISIT:  Follow-up for neuroendocrine carcinoid of the stomach + left kidney lesion + IDA   CURRENT THERAPY: Surveillance of gastric carcinoid tumor and kidney lesion + intermittent Feraheme for IDA + monthly B12 injections   INTERVAL HISTORY:  [name] 88 y.o. female returns for routine follow-up of her neuroendocrine carcinoid tumor of the stomach, left kidney lesion, and her iron deficiency anemia.  She was last seen by Tarri Abernethy PA-C on 03/05/2021.   At today's visit, she reports feeling "pretty good for her age."  Since her last visit, she was hospitalized from 06/21/2021 through 06/22/2021 after she had an episode of unresponsiveness, thought to be seizure versus stroke versus syncope; was also treated for UTI.   She denies any symptoms of carcinoid syndrome.  No wheezing, abdominal pain or cramping, diarrhea, or skin flushing.  No B symptoms such as fever, chills, night sweats, unintentional weight loss.    Denies any signs or symptoms of blood loss.  No current signs or symptoms of blood clots.   She has not noticed any recent bleeding such as hematemesis, hematochezia, melena, or epistaxis.  She denies any flank pain or hematuria.     She has  80% energy and 100% appetite. She endorses that she is maintaining a stable weight.   REVIEW OF SYSTEMS:    Review of Systems  Constitutional:  Negative for appetite change, chills, diaphoresis, fatigue, fever and unexpected weight change.  HENT:   Negative for lump/mass and nosebleeds.   Eyes:  Negative for eye problems.  Respiratory:  Negative for cough, hemoptysis and shortness of breath.   Cardiovascular:  Negative for chest pain, leg swelling and palpitations.  Gastrointestinal:  Negative for abdominal pain, blood in stool,  constipation, diarrhea, nausea and vomiting.  Genitourinary:  Negative for hematuria.   Skin: Negative.   Neurological:  Negative for dizziness, headaches and light-headedness.  Hematological:  Does not bruise/bleed easily.      PAST MEDICAL/SURGICAL HISTORY:  Past Medical History:  Diagnosis Date   Adult BMI 19-24 kg/sq m 2008 125 lbs   Anemia    Pernicious 2008-HB 12 MCV 119.5; JAN 2012 HB 13.2 MCV 97.6   Carcinoid tumor of stomach 2008   Diverticulosis    GERD (gastroesophageal reflux disease)    Hiatal hernia    HTN (hypertension)    Hyperlipidemia    Hypertension    Phreesia 02/07/2020   Mild memory loss 02/15/2011   Near syncope 04/18/2014   Pernicious anemia    Pernicious anemia 06/14/2010   Wrist fracture, left 2008   Past Surgical History:  Procedure Laterality Date   COLONOSCOPY  2003 LS   DC/[place] Diverticulosis   EUS  2008 DJ   FNA NO CARCINOID   EUS  JAN 2009   NL EXAM, NO Bx   EUS  [facility] 2011 WO   CARCINOID   UPPER GASTROINTESTINAL ENDOSCOPY  APR 2008 [facility]   CARCIONOID   UPPER GASTROINTESTINAL ENDOSCOPY  FEB 2011 [facility]   CARCINOID/mild anemia/large hiatal hernia     SOCIAL HISTORY:  Social History   Socioeconomic History   Marital status: Widowed    Spouse name: Not on file   Number of children: 1   Years of education: 108   Highest education level: Not on file  Occupational History   Not on file  Tobacco Use   Smoking status: Never   Smokeless tobacco: Never  Vaping Use   Vaping Use: Never used  Substance and Sexual Activity   Alcohol use: No   Drug use: No   Sexual activity: Not Currently    Birth control/protection: Post-menopausal  Other Topics Concern   Not on file  Social History Narrative   Live with son Ray   Social Determinants of Health   Financial Resource Strain: Low Risk  (02/22/2021)   Overall Financial Resource Strain (CARDIA)    Difficulty of Paying Living Expenses: Not hard at all  Food Insecurity: No Food Insecurity  (02/22/2021)   Hunger Vital Sign    Worried About Running Out of Food in the Last Year: Never true    [place] in the Last Year: Never true  Transportation Needs: No Transportation Needs (02/22/2021)   PRAPARE - Hydrologist (Medical): No    Lack of Transportation (Non-Medical): No  Physical Activity: Inactive (02/22/2021)   Exercise Vital Sign    Days of Exercise per Week: 0 days    Minutes of Exercise per Session: 0 min  Stress: No Stress Concern Present (02/22/2021)   [place]    Feeling of Stress : Not at all  Social Connections: Moderately Integrated (02/22/2021)   Social Connection and Isolation Panel [NHANES]    Frequency of Communication with Friends and Family: Three times a week    Frequency of Social Gatherings with Friends and Family: More than three times a week    Attends Religious Services: More than 4 times per year    Active Member of [employer] or Organizations: Yes    Attends Archivist Meetings: Never    Marital Status: Widowed  Intimate Partner Violence: Not At Risk (02/22/2021)   Humiliation, Afraid, Rape, and Kick questionnaire    Fear of Current or Ex-Partner: No    Emotionally Abused: No    Physically Abused: No    Sexually Abused: No    FAMILY HISTORY:  Family History  Problem Relation Age of Onset   Cancer Mother    Heart disease Brother    Kidney disease Son    Colon cancer Neg Hx    Colon polyps Neg Hx     CURRENT MEDICATIONS:  Outpatient Encounter Medications as of 09/11/2021  Medication Sig   acetaminophen (TYLENOL) 500 MG tablet Take 500 mg by mouth every 6 (six) hours as needed. For pain   aspirin EC 81 MG tablet Take 1 tablet (81 mg total) by mouth daily with breakfast.   donepezil (ARICEPT) 5 MG tablet TAKE 1 TABLET BY MOUTH AT BEDTIME   felodipine (PLENDIL) 5 MG 24 hr tablet Take 1 tablet (5 mg total) by mouth daily.   levothyroxine  (SYNTHROID) 25 MCG tablet Take 1 tablet (25 mcg total) by mouth daily before breakfast.   simvastatin (ZOCOR) 40 MG tablet Take 1 tablet (40 mg total) by mouth daily.   valsartan (DIOVAN) 40 MG tablet Take 1 tablet (40 mg total) by mouth daily. (Patient taking differently: Take 20 mg by mouth daily.)   No facility-administered encounter medications on file as of 09/11/2021.    ALLERGIES:  Allergies  Allergen Reactions   Codeine      PHYSICAL EXAM:    ECOG PERFORMANCE STATUS: 0 - Asymptomatic  There were no vitals filed for this visit.  There were no vitals filed for this visit.  Physical Exam Constitutional:      Appearance: Normal appearance. She is underweight.     Comments: Bedbugs noted on exam  HENT:     Head: Normocephalic and atraumatic.     Mouth/Throat:     Mouth: Mucous membranes are moist.  Eyes:     Extraocular Movements: Extraocular movements intact.     Pupils: Pupils are equal, round, and reactive to light.  Cardiovascular:     Rate and Rhythm: Normal rate and regular rhythm.     Pulses: Normal pulses.     Heart sounds: Normal heart sounds.  Pulmonary:     Effort: Pulmonary effort is normal.     Breath sounds: Normal breath sounds.  Abdominal:     General: Bowel sounds are normal.     Palpations: Abdomen is soft.     Tenderness: There is no abdominal tenderness.  Musculoskeletal:        General: No swelling.     Right lower leg: No edema.     Left lower leg: No edema.  Lymphadenopathy:     Cervical: No cervical adenopathy.  Skin:    General: Skin is warm and dry.  Neurological:     General: No focal deficit present.     Mental Status: She is alert and oriented to person, place, and time.  Psychiatric:        Mood and Affect: Mood normal.        Behavior: Behavior normal.     LABORATORY DATA:  I have reviewed the labs as listed.  CBC    Component Value Date/Time   WBC 4.6 09/03/2021 0910   RBC 3.51 (L) 09/03/2021 0910   HGB 11.5 (L)  09/03/2021 0910   HCT 36.5 09/03/2021 0910   PLT 147 (L) 09/03/2021 0910   MCV 104.0 (H) 09/03/2021 0910   MCH 32.8 09/03/2021 0910   MCHC 31.5 09/03/2021 0910   RDW 12.6 09/03/2021 0910   LYMPHSABS 0.8 09/03/2021 0910   MONOABS 0.3 09/03/2021 0910   EOSABS 0.1 09/03/2021 0910   BASOSABS 0.0 09/03/2021 0910      Latest Ref Rng & Units 09/03/2021    9:10 AM 06/21/2021    1:38 PM 06/21/2021    1:19 PM  CMP  Glucose 70 - 99 mg/dL 96  117  116   BUN 8 - 23 mg/dL '24  20  21   '$ Creatinine 0.44 - 1.00 mg/dL 1.19  1.30  1.20   Sodium 135 - 145 mmol/L 145  141  140   Potassium 3.5 - 5.1 mmol/L 3.9  3.7  3.6   Chloride 98 - 111 mmol/L 113  105  110   CO2 22 - 32 mmol/L 26   25   Calcium 8.9 - 10.3 mg/dL 9.2   8.8   Total Protein 6.5 - 8.1 g/dL 7.2   7.2   Total Bilirubin 0.3 - 1.2 mg/dL 0.8   0.7   Alkaline Phos 38 - 126 U/L 82   83   AST 15 - 41 U/L 21   24   ALT 0 - 44 U/L 10   12     DIAGNOSTIC IMAGING:  I have independently reviewed the relevant imaging and discussed with the patient.  ASSESSMENT & PLAN: 1.  Well-differentiated type 1 gastric carcinoid - EGD biopsy of the stomach in 2011 consistent with carcinoid tumor. - She has been on active surveillance since 2011 - CT  CAP (02/08/2020): No evidence of metastatic carcinoid.  Several small stable pulmonary nodules are present.  No obvious gastric mass or other indicator of active neuroendocrine tumor. - CT abdomen/pelvis (02/26/2021): Vividly enhancing endoluminal nodules within the herniated gastric body, the right aspect measuring 0.7 cm and at the left aspect measuring 0.4 cm; concerning for recurrent or metastatic gastric carcinoid given patient's known history.  Incidental renal mass as discussed below. - CT abdomen/pelvis (09/03/2021): Chronic large hiatal hernia noted without discrete gastric mass; no evidence of metastatic disease in abdomen or pelvis.  (Additional findings per radiology report, and as described below) - Most  recent Chromogranin A level 713.7 (elevated, but overall stable).  CBC and CMP at baseline. - No current symptoms of carcinoid syndrome or B symptoms.   - Discussed findings of most recent CT abdomen/pelvis with Dr. Delton Coombes, who recommends continued close observation at this time, in the setting of asymptomatic patient with advanced age.  This was discussed with patient, who agrees.   - PLAN:  RTC in 6 months with repeat labs including chromogranin level.  Repeat CT abdomen/pelvis in 1 year (August 2024) followed by office visit   2.  Left kidney lesions (suspected renal cell carcinoma) - CT abdomen/pelvis (02/26/2021) showed exophytic, mixed solid and cystic lesion of the superior pole of the left kidney measuring 2.9 x 1.9 cm with thick nodular enhancing internal septations, slightly enlarged compared to prior examination.  This is consistent with a Bosniak category 4 lesion, concerning for renal cell carcinoma - CT abdomen/pelvis (09/03/2021) negative for any appreciable interval change of multiple left kidney lesions (upper left renal cortical 2.5 cm Bosniak category 3 renal cyst, anterior interpolar 3.7 cm Bosniak category 3 renal cyst, mildly enhancing heterogeneous 2.1 cm renal cortical lower lesion in left lower kidney) which are suspicious for renal cell carcinoma.   - Denies any hematuria, flank pain, or B symptoms.   - Discussed findings of most recent CT abdomen/pelvis with Dr. Delton Coombes, who recommends continued close observation at this time, in the setting of asymptomatic patient with advanced age.  This was discussed with patient, who agrees.   - PLAN: Given her advanced age and lack of symptoms, patient prefers active follow-up rather than treatment at this time.  She has stated that she is not interested in either chemotherapy or surgery.  Repeat CT abdomen/pelvis in 1 year (August 2024), followed by office visit.  3.  Weight loss   - Patient reports good appetite and denies any  unintentional weight loss. - She was previously noted to have lost about 10 pounds in 6 months, but her weight has stabilized and remains   - Low BMI of 19.74 (09/11/2021), improved from previous - She has been seen by dietitian.  Recommended to drink 1-2 Boost Plus/equivalent daily   - PLAN: Continue to monitor at follow-up appointments   4.  B12 deficiency with history of pernicious anemia: - She is on monthly B12 injections. - Most recent labs (09/03/2021): B12 normal at 557 with normal methylmalonic acid - PLAN: Continue monthly B12 injections.  Recheck B12 and methylmalonic acid with repeat labs and RTC in 6 months.   5.  Iron deficiency state: - She is receiving intermittent iron infusions.  (Last with IV Feraheme x2 in January 2022) - She denies any signs or symptoms of blood loss such as bright red blood per rectum or melena   - Suspect malabsorption secondary to chronic atrophic gastritis and pernicious anemia   - Most recent  labs (09/03/2021): Hgb 11.5/MCV 104.0, ferritin 70, iron saturation 28 % - PLAN: No indication for IV iron at this time.  Repeat labs and RTC in 6 months.   6.  Bedbugs - Patient has been previously noted to have bedbugs during office visit (09/04/2020) - No bedbugs noted on today's exam  7.  Elevated blood pressure - Blood pressure today 206/85 - Patient did not take morning blood pressure medications (valsartan and felodipine) - She is asymptomatic - PLAN: Instructed to take blood pressure medication ASAP after appointment and to recheck blood pressure 60 minutes after taking her meds.  If blood pressure remains >160/90, instructed to call PCP.  PLAN SUMMARY & DISPOSITION:   Monthly B12 injections Labs in 6 months Office visit after labs CT abdomen/pelvis in 1 year (August 2024  All questions were answered. The patient knows to call the clinic with any problems, questions or concerns.  Medical decision making: Moderate    Time spent on visit: I spent  20 minutes counseling the patient face to face. The total time spent in the appointment was 30 minutes and more than 50% was on counseling.  ** Patient's son, Jeanell Sparrow, was present during this visit and the above results and plan were communicated with him.  All questions were answered and he agrees with the above plan.   Harriett Rush, PA-C  09/11/2021 10:00 AM

## 2021-09-11 ENCOUNTER — Inpatient Hospital Stay (HOSPITAL_BASED_OUTPATIENT_CLINIC_OR_DEPARTMENT_OTHER): Payer: PPO | Admitting: Physician Assistant

## 2021-09-11 VITALS — BP 206/85 | HR 64 | Temp 97.6°F | Resp 17 | Ht 60.0 in | Wt 101.1 lb

## 2021-09-11 DIAGNOSIS — D509 Iron deficiency anemia, unspecified: Secondary | ICD-10-CM | POA: Insufficient documentation

## 2021-09-11 DIAGNOSIS — D510 Vitamin B12 deficiency anemia due to intrinsic factor deficiency: Secondary | ICD-10-CM | POA: Diagnosis not present

## 2021-09-11 DIAGNOSIS — E538 Deficiency of other specified B group vitamins: Secondary | ICD-10-CM | POA: Insufficient documentation

## 2021-09-11 DIAGNOSIS — Z7982 Long term (current) use of aspirin: Secondary | ICD-10-CM | POA: Diagnosis not present

## 2021-09-11 DIAGNOSIS — N2889 Other specified disorders of kidney and ureter: Secondary | ICD-10-CM

## 2021-09-11 DIAGNOSIS — D3A092 Benign carcinoid tumor of the stomach: Secondary | ICD-10-CM | POA: Diagnosis not present

## 2021-09-11 DIAGNOSIS — Z79899 Other long term (current) drug therapy: Secondary | ICD-10-CM | POA: Diagnosis not present

## 2021-09-11 DIAGNOSIS — R636 Underweight: Secondary | ICD-10-CM

## 2021-09-11 DIAGNOSIS — C7A092 Malignant carcinoid tumor of the stomach: Secondary | ICD-10-CM | POA: Diagnosis not present

## 2021-09-11 NOTE — Patient Instructions (Signed)
[place] at [facility] Discharge Instructions  You were seen today by Tarri Abernethy PA-C for your iron deficiency anemia and history of gastric carcinoid tumor.  Your blood and iron levels looked great.  You do not need any IV iron today.  We will check your levels again and see you back in 6 months.  Regarding your gastric carcinoid tumor, your tumor marker labs are stable, but your recent CT scan did not show any thank you growth in your gastric tumor.  Since you do not have any symptoms of this, there is no treatment needed at this time.  We will check your CT scan again in 12 months.  You most recent CT scan continues to shows expected kidney cancer known as renal cell carcinoma.  Due to your advanced age, surgery or chemotherapy is not advised.  We will continue to follow-up with another CT scan in 12 months.  Continue your monthly B12 injections.  Your weight is a little bit low, please make sure that you eat adequate nutrition.  We have sent a referral for our nutritionist to see you as well.  Take your blood pressure medication as soon as you get home. - Valsartan and Felodipine can be taken with or without food. - Recheck your blood pressure 60 minutes after taking your medication.  If it is still higher than 160/90, please call your primary care doctor for further information. - Seek immediate medical attention if you have any chest pain, difficulty breathing, or signs of stroke.  LABS: Return in 6 months for repeat labs  FOLLOW-UP APPOINTMENT: Office visit in 6 months (1 week after labs)   Thank you for choosing [place] at [facility] to provide your oncology and hematology care.  To afford each patient quality time with our provider, please arrive at least 15 minutes before your scheduled appointment time.   If you have a lab appointment with the [facility] please come in thru the Main Entrance and check in at the main  information desk.  You need to re-schedule your appointment should you arrive 10 or more minutes late.  We strive to give you quality time with our providers, and arriving late affects you and other patients whose appointments are after yours.  Also, if you no show three or more times for appointments you may be dismissed from the clinic at the providers discretion.     Again, thank you for choosing [facility].  Our hope is that these requests will decrease the amount of time that you wait before being seen by our physicians.       _____________________________________________________________  Should you have questions after your visit to [facility], please contact our office at [phone] and follow the prompts.  Our office hours are 8:00 a.m. and 4:30 p.m. Monday - Friday.  Please note that voicemails left after 4:00 p.m. may not be returned until the following business day.  We are closed weekends and major holidays.  You do have access to a nurse 24-7, just call the main number to the clinic [phone] and do not press any options, hold on the line and a nurse will answer the phone.    For prescription refill requests, have your pharmacy contact our office and allow 72 hours.    Due to Covid, you will need to wear a mask upon entering the hospital. If you do not have a mask, a mask  will be given to you at the Main Entrance upon arrival. For doctor visits, patients may have 1 support person age 3 or older with them. For treatment visits, patients can not have anyone with them due to social distancing guidelines and our immunocompromised population.

## 2021-09-20 ENCOUNTER — Inpatient Hospital Stay: Payer: PPO

## 2021-09-20 VITALS — BP 134/56 | HR 66 | Temp 98.6°F

## 2021-09-20 DIAGNOSIS — D510 Vitamin B12 deficiency anemia due to intrinsic factor deficiency: Secondary | ICD-10-CM

## 2021-09-20 DIAGNOSIS — C7A092 Malignant carcinoid tumor of the stomach: Secondary | ICD-10-CM | POA: Diagnosis not present

## 2021-09-20 DIAGNOSIS — D509 Iron deficiency anemia, unspecified: Secondary | ICD-10-CM

## 2021-09-20 MED ORDER — CYANOCOBALAMIN 1000 MCG/ML IJ SOLN
1000.0000 ug | Freq: Once | INTRAMUSCULAR | Status: AC
  Administered 2021-09-20: 1000 ug via INTRAMUSCULAR
  Filled 2021-09-20: qty 1

## 2021-09-20 NOTE — Progress Notes (Signed)
B12 injection  given per orders. Patient tolerated it well without problems. Vitals stable and discharged home from clinic ambulatory. Follow up as scheduled.  

## 2021-09-21 ENCOUNTER — Ambulatory Visit: Payer: PPO

## 2021-09-23 ENCOUNTER — Other Ambulatory Visit: Payer: Self-pay | Admitting: Internal Medicine

## 2021-09-23 DIAGNOSIS — F0280 Dementia in other diseases classified elsewhere without behavioral disturbance: Secondary | ICD-10-CM

## 2021-10-19 ENCOUNTER — Ambulatory Visit (INDEPENDENT_AMBULATORY_CARE_PROVIDER_SITE_OTHER): Payer: PPO | Admitting: Internal Medicine

## 2021-10-19 ENCOUNTER — Encounter: Payer: Self-pay | Admitting: Internal Medicine

## 2021-10-19 VITALS — BP 144/88 | HR 98 | Resp 16 | Ht 60.0 in | Wt 99.8 lb

## 2021-10-19 DIAGNOSIS — F0280 Dementia in other diseases classified elsewhere without behavioral disturbance: Secondary | ICD-10-CM

## 2021-10-19 DIAGNOSIS — E039 Hypothyroidism, unspecified: Secondary | ICD-10-CM

## 2021-10-19 DIAGNOSIS — N1831 Chronic kidney disease, stage 3a: Secondary | ICD-10-CM

## 2021-10-19 DIAGNOSIS — I10 Essential (primary) hypertension: Secondary | ICD-10-CM

## 2021-10-19 DIAGNOSIS — G301 Alzheimer's disease with late onset: Secondary | ICD-10-CM | POA: Diagnosis not present

## 2021-10-19 DIAGNOSIS — M542 Cervicalgia: Secondary | ICD-10-CM

## 2021-10-19 DIAGNOSIS — R7303 Prediabetes: Secondary | ICD-10-CM

## 2021-10-19 MED ORDER — VALSARTAN 40 MG PO TABS: 40.0000 mg | ORAL_TABLET | Freq: Every day | ORAL | 1 refills | Status: DC

## 2021-10-19 MED ORDER — FELODIPINE ER 5 MG PO TB24: 5.0000 mg | ORAL_TABLET | Freq: Every day | ORAL | 1 refills | Status: DC

## 2021-10-19 NOTE — Assessment & Plan Note (Signed)
On Donepezil 5 mg QD Has had slight worsening in her cognition recently

## 2021-10-19 NOTE — Assessment & Plan Note (Signed)
BP Readings from Last 1 Encounters:  10/19/21 (!) 144/88   Usually well-controlled with Felodipine and Valsartan, but has not taken her medication today Counseled for compliance with the medications Advised DASH diet and moderate exercise/walking as tolerated

## 2021-10-19 NOTE — Assessment & Plan Note (Signed)
Check BMP Avoid nephrotoxic agents Advised to maintain adequate hydration On ARB

## 2021-10-19 NOTE — Patient Instructions (Signed)
Please continue taking medications as prescribed.  Please continue to follow low salt diet and ambulate as tolerated.  Please check BP once daily and contact us if BP is above 150/90.  Okay to take Tylenol as needed for neck pain.

## 2021-10-19 NOTE — Assessment & Plan Note (Signed)
Lab Results  Component Value Date   TSH 3.880 07/19/2020   On Levothyroxine 25 mcg QD, needs to stay compliant Check TSH and free T4

## 2021-10-19 NOTE — Assessment & Plan Note (Signed)
Likely muscle strain due to sleeping posture Advised to take Tylenol as needed Heating pad as needed

## 2021-10-19 NOTE — Progress Notes (Signed)
Established Patient Office Visit  Subjective:  Patient ID: [name], female    DOB: October 08, 1922  Age: 88 y.o. MRN: [id number]  CC:  Chief Complaint  Patient presents with   Hypertension    Follow up visit    Neck Pain    States some days she has pain in the left side of her neck but its not bothering her today     HPI [name] is a 88 y.o. female with past medical history of HTN, benign neuroendocrine carcinoid tumor of stomach, iron deficiency anemia, vitamin B12 deficiency and malnutrition who presents for f/u of her chronic medical conditions.  She reports intermittent left-sided neck pain, which is dull, nonradiating.  She denies any recent injury.  Denies any neck swelling, tingling or numbness of the UE.  She has not tried any OTC pain medication yet.  HTN: Her BP has been fluctuating as she forgets to take her medicines.  Her son has been helping her with her medications now. She has not had her antihypertensives today. she denies any headache, chest pain, dyspnea or palpitations.  She takes levothyroxine for history of hypothyroidism.  Denies any recent change in weight or appetite.  She has chronic tremors.  Past Medical History:  Diagnosis Date   Adult BMI 19-24 kg/sq m 2008 125 lbs   Anemia    Pernicious 2008-HB 12 MCV 119.5; JAN 2012 HB 13.2 MCV 97.6   Carcinoid tumor of stomach 2008   Diverticulosis    GERD (gastroesophageal reflux disease)    Hiatal hernia    HTN (hypertension)    Hyperlipidemia    Hypertension    Phreesia 02/07/2020   Mild memory loss 02/15/2011   Near syncope 04/18/2014   Pernicious anemia    Pernicious anemia 06/14/2010   Wrist fracture, left 2008    Past Surgical History:  Procedure Laterality Date   COLONOSCOPY  2003 LS   DC/[place] Diverticulosis   EUS  2008 DJ   FNA NO CARCINOID   EUS  JAN 2009   NL EXAM, NO Bx   EUS  [facility] 2011 WO   CARCINOID   UPPER GASTROINTESTINAL ENDOSCOPY  APR 2008 SLF   CARCIONOID   UPPER  GASTROINTESTINAL ENDOSCOPY  FEB 2011 [facility]   CARCINOID/mild anemia/large hiatal hernia    Family History  Problem Relation Age of Onset   Cancer Mother    Heart disease Brother    Kidney disease Son    Colon cancer Neg Hx    Colon polyps Neg Hx     Social History   Socioeconomic History   Marital status: Widowed    Spouse name: Not on file   Number of children: 1   Years of education: 28   Highest education level: Not on file  Occupational History   Not on file  Tobacco Use   Smoking status: Never   Smokeless tobacco: Never  Vaping Use   Vaping Use: Never used  Substance and Sexual Activity   Alcohol use: No   Drug use: No   Sexual activity: Not Currently    Birth control/protection: Post-menopausal  Other Topics Concern   Not on file  Social History Narrative   Live with son [name]   Social Determinants of Health   Financial Resource Strain: Low Risk  (02/22/2021)   Overall Financial Resource Strain (CARDIA)    Difficulty of Paying Living Expenses: Not hard at all  Food Insecurity: No Food Insecurity (02/22/2021)   Hunger Vital  Sign    Worried About Charity fundraiser in the Last Year: Never true    [place] in the Last Year: Never true  Transportation Needs: No Transportation Needs (02/22/2021)   PRAPARE - Hydrologist (Medical): No    Lack of Transportation (Non-Medical): No  Physical Activity: Inactive (02/22/2021)   Exercise Vital Sign    Days of Exercise per Week: 0 days    Minutes of Exercise per Session: 0 min  Stress: No Stress Concern Present (02/22/2021)   [place]    Feeling of Stress : Not at all  Social Connections: Moderately Integrated (02/22/2021)   Social Connection and Isolation Panel [NHANES]    Frequency of Communication with Friends and Family: Three times a week    Frequency of Social Gatherings with Friends and Family: More than three times a week     Attends Religious Services: More than 4 times per year    Active Member of [employer] or Organizations: Yes    Attends Archivist Meetings: Never    Marital Status: Widowed  Intimate Partner Violence: Not At Risk (02/22/2021)   Humiliation, Afraid, Rape, and Kick questionnaire    Fear of Current or Ex-Partner: No    Emotionally Abused: No    Physically Abused: No    Sexually Abused: No    Outpatient Medications Prior to Visit  Medication Sig Dispense Refill   acetaminophen (TYLENOL) 500 MG tablet Take 500 mg by mouth every 6 (six) hours as needed. For pain     aspirin EC 81 MG tablet Take 1 tablet (81 mg total) by mouth daily with breakfast. 30 tablet 2   donepezil (ARICEPT) 5 MG tablet TAKE 1 TABLET BY MOUTH AT BEDTIME 30 tablet 4   famotidine (PEPCID) 20 MG tablet Take 20 mg by mouth daily.     levothyroxine (SYNTHROID) 25 MCG tablet Take 1 tablet (25 mcg total) by mouth daily before breakfast. 90 tablet 1   simvastatin (ZOCOR) 40 MG tablet Take 1 tablet (40 mg total) by mouth daily. 90 tablet 3   felodipine (PLENDIL) 5 MG 24 hr tablet Take 1 tablet (5 mg total) by mouth daily. 90 tablet 1   valsartan (DIOVAN) 40 MG tablet Take 1 tablet (40 mg total) by mouth daily. 90 tablet 1   No facility-administered medications prior to visit.    Allergies  Allergen Reactions   Codeine     ROS Review of Systems  Constitutional:  Negative for chills and fever.  HENT:  Negative for congestion, sinus pressure, sinus pain and sore throat.   Eyes:  Negative for pain and discharge.  Respiratory:  Negative for cough and shortness of breath.   Cardiovascular:  Negative for chest pain and palpitations.  Gastrointestinal:  Negative for abdominal pain, diarrhea, nausea and vomiting.  Endocrine: Negative for polydipsia and polyuria.  Genitourinary:  Negative for dysuria and hematuria.  Musculoskeletal:  Positive for back pain. Negative for neck pain and neck stiffness.  Skin:  Negative for  rash.  Neurological:  Negative for dizziness, weakness and headaches.  Psychiatric/Behavioral:  Negative for agitation and behavioral problems.       Objective:    Physical Exam Vitals reviewed.  Constitutional:      General: She is not in acute distress.    Appearance: She is not diaphoretic.  HENT:     Head: Normocephalic and atraumatic.  Nose: Nose normal. No congestion.     Mouth/Throat:     Mouth: Mucous membranes are moist.     Pharynx: No posterior oropharyngeal erythema.  Eyes:     General: No scleral icterus.    Extraocular Movements: Extraocular movements intact.  Cardiovascular:     Rate and Rhythm: Normal rate and regular rhythm.     Pulses: Normal pulses.     Heart sounds: Normal heart sounds. No murmur heard. Pulmonary:     Breath sounds: Normal breath sounds. No wheezing or rales.  Musculoskeletal:     Cervical back: Neck supple. No tenderness.     Right lower leg: Edema (1+) present.     Left lower leg: Edema (1+) present.  Skin:    General: Skin is warm.     Findings: No rash.  Neurological:     General: No focal deficit present.     Mental Status: She is alert and oriented to person, place, and time.     Sensory: No sensory deficit.     Motor: No weakness.  Psychiatric:        Mood and Affect: Mood normal.        Behavior: Behavior normal.     BP (!) 144/88 (BP Location: Right Arm)   Pulse 98   Resp 16   Ht 5' (1.524 m)   Wt 99 lb 12.8 oz (45.3 kg)   SpO2 99%   BMI 19.49 kg/m  Wt Readings from Last 3 Encounters:  10/19/21 99 lb 12.8 oz (45.3 kg)  09/11/21 101 lb 1.6 oz (45.9 kg)  06/28/21 99 lb (44.9 kg)    Lab Results  Component Value Date   TSH 3.880 07/19/2020   Lab Results  Component Value Date   WBC 4.6 09/03/2021   HGB 11.5 (L) 09/03/2021   HCT 36.5 09/03/2021   MCV 104.0 (H) 09/03/2021   PLT 147 (L) 09/03/2021   Lab Results  Component Value Date   NA 145 09/03/2021   K 3.9 09/03/2021   CO2 26 09/03/2021   GLUCOSE  96 09/03/2021   BUN 24 (H) 09/03/2021   CREATININE 1.19 (H) 09/03/2021   BILITOT 0.8 09/03/2021   ALKPHOS 82 09/03/2021   AST 21 09/03/2021   ALT 10 09/03/2021   PROT 7.2 09/03/2021   ALBUMIN 3.9 09/03/2021   CALCIUM 9.2 09/03/2021   ANIONGAP 6 09/03/2021   EGFR 50 (L) 07/19/2020   No results found for: "CHOL" No results found for: "HDL" No results found for: "LDLCALC" No results found for: "TRIG" No results found for: "CHOLHDL" No results found for: "HGBA1C"    Assessment & Plan:   Problem List Items Addressed This Visit       Cardiovascular and Mediastinum   Essential hypertension - Primary    BP Readings from Last 1 Encounters:  10/19/21 (!) 144/88  Usually well-controlled with Felodipine and Valsartan, but has not taken her medication today Counseled for compliance with the medications Advised DASH diet and moderate exercise/walking as tolerated      Relevant Medications   valsartan (DIOVAN) 40 MG tablet   felodipine (PLENDIL) 5 MG 24 hr tablet   Other Relevant Orders   Basic Metabolic Panel (BMET)     Endocrine   Hypothyroidism    Lab Results  Component Value Date   TSH 3.880 07/19/2020  On Levothyroxine 25 mcg QD, needs to stay compliant Check TSH and free T4      Relevant Orders   TSH +  free T4     Nervous and Auditory   Late onset Alzheimer's dementia without behavioral disturbance (HCC)    On Donepezil 5 mg QD Has had slight worsening in her cognition recently        Genitourinary   Stage 3a chronic kidney disease (HCC)    Check BMP Avoid nephrotoxic agents Advised to maintain adequate hydration On ARB        Other   Neck pain    Likely muscle strain due to sleeping posture Advised to take Tylenol as needed Heating pad as needed      Other Visit Diagnoses     Prediabetes       Relevant Orders   Hemoglobin A1c       Meds ordered this encounter  Medications   valsartan (DIOVAN) 40 MG tablet    Sig: Take 1 tablet (40 mg  total) by mouth daily.    Dispense:  90 tablet    Refill:  1   felodipine (PLENDIL) 5 MG 24 hr tablet    Sig: Take 1 tablet (5 mg total) by mouth daily.    Dispense:  90 tablet    Refill:  1    Follow-up: Return in about 4 months (around 02/18/2022) for Annual physical.    [name], MD

## 2021-10-22 ENCOUNTER — Inpatient Hospital Stay: Payer: PPO

## 2021-10-24 ENCOUNTER — Inpatient Hospital Stay: Payer: PPO | Attending: Hematology

## 2021-10-24 VITALS — BP 127/65 | HR 82 | Temp 98.9°F | Resp 18

## 2021-10-24 DIAGNOSIS — D509 Iron deficiency anemia, unspecified: Secondary | ICD-10-CM

## 2021-10-24 DIAGNOSIS — C7A092 Malignant carcinoid tumor of the stomach: Secondary | ICD-10-CM | POA: Diagnosis present

## 2021-10-24 DIAGNOSIS — E538 Deficiency of other specified B group vitamins: Secondary | ICD-10-CM | POA: Diagnosis not present

## 2021-10-24 DIAGNOSIS — D510 Vitamin B12 deficiency anemia due to intrinsic factor deficiency: Secondary | ICD-10-CM

## 2021-10-24 MED ORDER — CYANOCOBALAMIN 1000 MCG/ML IJ SOLN
1000.0000 ug | Freq: Once | INTRAMUSCULAR | Status: AC
  Administered 2021-10-24: 1000 ug via INTRAMUSCULAR
  Filled 2021-10-24: qty 1

## 2021-10-24 NOTE — Patient Instructions (Signed)
[place] [facility]  Discharge Instructions: Thank you for choosing [facility] to provide your oncology and hematology care.  If you have a lab appointment with the [facility], please come in thru the Main Entrance and check in at the main information desk.  Wear comfortable clothing and clothing appropriate for easy access to any Portacath or PICC line.   We strive to give you quality time with your provider. You may need to reschedule your appointment if you arrive late (15 or more minutes).  Arriving late affects you and other patients whose appointments are after yours.  Also, if you miss three or more appointments without notifying the office, you may be dismissed from the clinic at the provider's discretion.      For prescription refill requests, have your pharmacy contact our office and allow 72 hours for refills to be completed.    Today you received the following B12 injection.       To help prevent nausea and vomiting after your treatment, we encourage you to take your nausea medication as directed.  BELOW ARE SYMPTOMS THAT SHOULD BE REPORTED IMMEDIATELY: *FEVER GREATER THAN 100.4 F (38 C) OR HIGHER *CHILLS OR SWEATING *NAUSEA AND VOMITING THAT IS NOT CONTROLLED WITH YOUR NAUSEA MEDICATION *UNUSUAL SHORTNESS OF BREATH *UNUSUAL BRUISING OR BLEEDING *URINARY PROBLEMS (pain or burning when urinating, or frequent urination) *BOWEL PROBLEMS (unusual diarrhea, constipation, pain near the anus) TENDERNESS IN MOUTH AND THROAT WITH OR WITHOUT PRESENCE OF ULCERS (sore throat, sores in mouth, or a toothache) UNUSUAL RASH, SWELLING OR PAIN  UNUSUAL VAGINAL DISCHARGE OR ITCHING   Items with * indicate a potential emergency and should be followed up as soon as possible or go to the Emergency Department if any problems should occur.  Please show the CHEMOTHERAPY ALERT CARD or IMMUNOTHERAPY ALERT CARD at check-in to the Emergency Department and triage  nurse.  Should you have questions after your visit or need to cancel or reschedule your appointment, please contact [facility] [phone]  and follow the prompts.  Office hours are 8:00 a.m. to 4:30 p.m. Monday - Friday. Please note that voicemails left after 4:00 p.m. may not be returned until the following business day.  We are closed weekends and major holidays. You have access to a nurse at all times for urgent questions. Please call the main number to the clinic [phone] and follow the prompts.  For any non-urgent questions, you may also contact your provider using MyChart. We now offer e-Visits for anyone 69 and older to request care online for non-urgent symptoms. For details visit [email or web address].   Also download the MyChart app! Go to the app store, search "MyChart", open the app, select [facility], and log in with your MyChart username and password.  Masks are optional in the cancer centers. If you would like for your care team to wear a mask while they are taking care of you, please let them know. You may have one support person who is at least 86 years old accompany you for your appointments.

## 2021-10-24 NOTE — Progress Notes (Signed)
B12 injection  given per orders. Patient tolerated it well without problems. Vitals stable and discharged home from clinic ambulatory. Follow up as scheduled.  

## 2021-10-25 DIAGNOSIS — E119 Type 2 diabetes mellitus without complications: Secondary | ICD-10-CM | POA: Diagnosis not present

## 2021-10-25 DIAGNOSIS — E039 Hypothyroidism, unspecified: Secondary | ICD-10-CM | POA: Diagnosis not present

## 2021-10-25 DIAGNOSIS — I10 Essential (primary) hypertension: Secondary | ICD-10-CM | POA: Diagnosis not present

## 2021-10-26 LAB — BASIC METABOLIC PANEL
BUN/Creatinine Ratio: 15 (ref 12–28)
BUN: 17 mg/dL (ref 10–36)
CO2: 21 mmol/L (ref 20–29)
Calcium: 9.2 mg/dL (ref 8.7–10.3)
Chloride: 101 mmol/L (ref 96–106)
Creatinine, Ser: 1.14 mg/dL — ABNORMAL HIGH (ref 0.57–1.00)
Glucose: 90 mg/dL (ref 70–99)
Potassium: 4.3 mmol/L (ref 3.5–5.2)
Sodium: 138 mmol/L (ref 134–144)
eGFR: 44 mL/min/{1.73_m2} — ABNORMAL LOW (ref >59)

## 2021-10-26 LAB — TSH+FREE T4
Free T4: 1.71 ng/dL (ref 0.82–1.77)
TSH: 2.88 u[IU]/mL (ref 0.450–4.500)

## 2021-10-26 LAB — HEMOGLOBIN A1C
Est. average glucose Bld gHb Est-mCnc: 100 mg/dL
Hgb A1c MFr Bld: 5.1 % (ref 4.8–5.6)

## 2021-11-22 ENCOUNTER — Inpatient Hospital Stay: Payer: PPO | Attending: Hematology

## 2021-11-22 VITALS — BP 168/80 | HR 91 | Temp 98.2°F | Resp 18

## 2021-11-22 DIAGNOSIS — C7A092 Malignant carcinoid tumor of the stomach: Secondary | ICD-10-CM | POA: Diagnosis not present

## 2021-11-22 DIAGNOSIS — D510 Vitamin B12 deficiency anemia due to intrinsic factor deficiency: Secondary | ICD-10-CM

## 2021-11-22 DIAGNOSIS — E538 Deficiency of other specified B group vitamins: Secondary | ICD-10-CM | POA: Diagnosis not present

## 2021-11-22 DIAGNOSIS — D509 Iron deficiency anemia, unspecified: Secondary | ICD-10-CM

## 2021-11-22 MED ORDER — CYANOCOBALAMIN 1000 MCG/ML IJ SOLN
1000.0000 ug | Freq: Once | INTRAMUSCULAR | Status: AC
  Administered 2021-11-22: 1000 ug via INTRAMUSCULAR
  Filled 2021-11-22: qty 1

## 2021-11-22 NOTE — Patient Instructions (Signed)
[facility]  Discharge Instructions: Thank you for choosing [facility] to provide your oncology and hematology care.  If you have a lab appointment with the Cancer Center, please come in thru the Main Entrance and check in at the main information desk.  Wear comfortable clothing and clothing appropriate for easy access to any Portacath or PICC line.   We strive to give you quality time with your provider. You may need to reschedule your appointment if you arrive late (15 or more minutes).  Arriving late affects you and other patients whose appointments are after yours.  Also, if you miss three or more appointments without notifying the office, you may be dismissed from the clinic at the provider's discretion.      For prescription refill requests, have your pharmacy contact our office and allow 72 hours for refills to be completed.    Today you received the following chemotherapy and/or immunotherapy agents B12      To help prevent nausea and vomiting after your treatment, we encourage you to take your nausea medication as directed.  BELOW ARE SYMPTOMS THAT SHOULD BE REPORTED IMMEDIATELY: *FEVER GREATER THAN 100.4 F (38 C) OR HIGHER *CHILLS OR SWEATING *NAUSEA AND VOMITING THAT IS NOT CONTROLLED WITH YOUR NAUSEA MEDICATION *UNUSUAL SHORTNESS OF BREATH *UNUSUAL BRUISING OR BLEEDING *URINARY PROBLEMS (pain or burning when urinating, or frequent urination) *BOWEL PROBLEMS (unusual diarrhea, constipation, pain near the anus) TENDERNESS IN MOUTH AND THROAT WITH OR WITHOUT PRESENCE OF ULCERS (sore throat, sores in mouth, or a toothache) UNUSUAL RASH, SWELLING OR PAIN  UNUSUAL VAGINAL DISCHARGE OR ITCHING   Items with * indicate a potential emergency and should be followed up as soon as possible or go to the Emergency Department if any problems should occur.  Please show the CHEMOTHERAPY ALERT CARD or IMMUNOTHERAPY ALERT CARD at check-in to the Emergency  Department and triage nurse.  Should you have questions after your visit or need to cancel or reschedule your appointment, please contact [facility] [phone]  and follow the prompts.  Office hours are 8:00 a.m. to 4:30 p.m. Monday - Friday. Please note that voicemails left after 4:00 p.m. may not be returned until the following business day.  We are closed weekends and major holidays. You have access to a nurse at all times for urgent questions. Please call the main number to the clinic [phone] and follow the prompts.  For any non-urgent questions, you may also contact your provider using MyChart. We now offer e-Visits for anyone 18 and older to request care online for non-urgent symptoms. For details visit [email or web address].   Also download the MyChart app! Go to the app store, search "MyChart", open the app, select [facility], and log in with your MyChart username and password.  Masks are optional in the cancer centers. If you would like for your care team to wear a mask while they are taking care of you, please let them know. You may have one support person who is at least 86 years old accompany you for your appointments.  

## 2021-11-22 NOTE — Progress Notes (Signed)
[name] presents today for B12 injection per the provider's orders.  Stable during administration without incident; injection site WNL; see MAR for injection details.  Patient tolerated procedure well and without incident.  No questions or complaints noted at this time.

## 2021-11-27 ENCOUNTER — Other Ambulatory Visit: Payer: Self-pay | Admitting: Internal Medicine

## 2021-11-27 DIAGNOSIS — E039 Hypothyroidism, unspecified: Secondary | ICD-10-CM

## 2021-12-12 ENCOUNTER — Emergency Department (HOSPITAL_COMMUNITY): Payer: PPO

## 2021-12-12 ENCOUNTER — Other Ambulatory Visit: Payer: Self-pay

## 2021-12-12 ENCOUNTER — Emergency Department (HOSPITAL_COMMUNITY)
Admission: EM | Admit: 2021-12-12 | Discharge: 2021-12-12 | Disposition: A | Discharge status: Home / Self Care | Payer: PPO | Attending: Emergency Medicine | Admitting: Emergency Medicine

## 2021-12-12 ENCOUNTER — Encounter (HOSPITAL_COMMUNITY): Payer: Self-pay | Admitting: *Deleted

## 2021-12-12 DIAGNOSIS — E860 Dehydration: Secondary | ICD-10-CM | POA: Insufficient documentation

## 2021-12-12 DIAGNOSIS — N390 Urinary tract infection, site not specified: Secondary | ICD-10-CM | POA: Diagnosis not present

## 2021-12-12 DIAGNOSIS — Z7982 Long term (current) use of aspirin: Secondary | ICD-10-CM | POA: Diagnosis not present

## 2021-12-12 DIAGNOSIS — R55 Syncope and collapse: Secondary | ICD-10-CM | POA: Diagnosis not present

## 2021-12-12 DIAGNOSIS — F0390 Unspecified dementia without behavioral disturbance: Secondary | ICD-10-CM | POA: Diagnosis not present

## 2021-12-12 DIAGNOSIS — Z79899 Other long term (current) drug therapy: Secondary | ICD-10-CM | POA: Diagnosis not present

## 2021-12-12 DIAGNOSIS — K449 Diaphragmatic hernia without obstruction or gangrene: Secondary | ICD-10-CM | POA: Diagnosis not present

## 2021-12-12 LAB — URINALYSIS, ROUTINE W REFLEX MICROSCOPIC
Bilirubin Urine: NEGATIVE
Glucose, UA: NEGATIVE mg/dL
Ketones, ur: 20 mg/dL — AB
Nitrite: NEGATIVE
Protein, ur: 100 mg/dL — AB
Specific Gravity, Urine: 1.019 (ref 1.005–1.030)
WBC, UA: >50 WBC/hpf — ABNORMAL HIGH (ref 0–5)
pH: 5 (ref 5.0–8.0)

## 2021-12-12 LAB — CBC WITH DIFFERENTIAL/PLATELET
Abs Immature Granulocytes: 0.02 10*3/uL (ref 0.00–0.07)
Basophils Absolute: 0 10*3/uL (ref 0.0–0.1)
Basophils Relative: 0 %
Eosinophils Absolute: 0 10*3/uL (ref 0.0–0.5)
Eosinophils Relative: 0 %
HCT: 44 % (ref 36.0–46.0)
Hemoglobin: 13.9 g/dL (ref 12.0–15.0)
Immature Granulocytes: 0 %
Lymphocytes Relative: 5 %
Lymphs Abs: 0.4 10*3/uL — ABNORMAL LOW (ref 0.7–4.0)
MCH: 31.4 pg (ref 26.0–34.0)
MCHC: 31.6 g/dL (ref 30.0–36.0)
MCV: 99.5 fL (ref 80.0–100.0)
Monocytes Absolute: 0.4 10*3/uL (ref 0.1–1.0)
Monocytes Relative: 4 %
Neutro Abs: 7.8 10*3/uL — ABNORMAL HIGH (ref 1.7–7.7)
Neutrophils Relative %: 91 %
Platelets: 176 10*3/uL (ref 150–400)
RBC: 4.42 MIL/uL (ref 3.87–5.11)
RDW: 11.8 % (ref 11.5–15.5)
WBC: 8.7 10*3/uL (ref 4.0–10.5)
nRBC: 0 % (ref 0.0–0.2)

## 2021-12-12 LAB — COMPREHENSIVE METABOLIC PANEL
ALT: 11 U/L (ref 0–44)
AST: 23 U/L (ref 15–41)
Albumin: 4.1 g/dL (ref 3.5–5.0)
Alkaline Phosphatase: 80 U/L (ref 38–126)
Anion gap: 9 (ref 5–15)
BUN: 30 mg/dL — ABNORMAL HIGH (ref 8–23)
CO2: 25 mmol/L (ref 22–32)
Calcium: 9.2 mg/dL (ref 8.9–10.3)
Chloride: 106 mmol/L (ref 98–111)
Creatinine, Ser: 1.54 mg/dL — ABNORMAL HIGH (ref 0.44–1.00)
GFR, Estimated: 31 mL/min — ABNORMAL LOW (ref >60)
Glucose, Bld: 119 mg/dL — ABNORMAL HIGH (ref 70–99)
Potassium: 4.3 mmol/L (ref 3.5–5.1)
Sodium: 140 mmol/L (ref 135–145)
Total Bilirubin: 0.7 mg/dL (ref 0.3–1.2)
Total Protein: 7.9 g/dL (ref 6.5–8.1)

## 2021-12-12 LAB — CBG MONITORING, ED: Glucose-Capillary: 104 mg/dL — ABNORMAL HIGH (ref 70–99)

## 2021-12-12 MED ORDER — SODIUM CHLORIDE 0.9 % IV SOLN
1.0000 g | Freq: Once | INTRAVENOUS | Status: AC
  Administered 2021-12-12: 1 g via INTRAVENOUS
  Filled 2021-12-12: qty 10

## 2021-12-12 MED ORDER — SODIUM CHLORIDE 0.9 % IV BOLUS
500.0000 mL | Freq: Once | INTRAVENOUS | Status: AC
  Administered 2021-12-12: 500 mL via INTRAVENOUS

## 2021-12-12 MED ORDER — CEPHALEXIN 500 MG PO CAPS: 500.0000 mg | ORAL_CAPSULE | Freq: Three times a day (TID) | ORAL | 0 refills | Status: DC

## 2021-12-12 NOTE — ED Triage Notes (Signed)
Pt's son brought pt in for c/o near syncope episode while in the car today. Pt reports pt hasn't complained of anything and when he came back to the car from inside the drugstore she was slumped over in the car, but not completely unresponsive. Pt remembers the incident but reports she felt extremely weak. Pt also reports she has been nauseated since this morning. Denies pain.

## 2021-12-12 NOTE — Discharge Instructions (Signed)
Your testing shows some dehydration and a urinary infection.  Please take Keflex 3 times daily for the neck 7 days, ER for worsening symptoms, see your doctor within 48 hours for recheck

## 2021-12-12 NOTE — ED Provider Notes (Signed)
[facility] EMERGENCY DEPARTMENT Provider Note   CSN: [id number] Arrival date & time: 12/12/21  1448     History  Chief Complaint  Patient presents with   Near Syncope    [name] is a 88 y.o. female.   Near Syncope   This patient is a very 88 year old female taking a baby aspirin, she has dementia on Aricept, she has Synthroid for levothyroxine and is taking a antihypertensive in the form of valsartan.  She presents to the hospital with a complaint of altered mental status.  According to the son who she lives with he had gone into the pharmacy, he came out 15 minutes later and found her in the car slumped over, he was able to arouse her close to normal, she then had another episode where she was slightly unresponsive but is currently back to normal.  He states this is happened several times over the last year, she has had evaluations for the same thing in the past, she usually gets back to her normal self.  There was no evidence of seizure activity, she did not have any urinary symptoms nor did she urinate on herself today, there was no tongue biting, she had no difficulty with her speech when she was awake.  At this time the patient has no complaints and has not had any fevers.  He does report a slight cough recently.  No history of frequent urinary tract infections.    Home Medications Prior to Admission medications   Medication Sig Start Date End Date Taking? Authorizing Provider  cephALEXin (KEFLEX) 500 MG capsule Take 1 capsule (500 mg total) by mouth 3 (three) times daily for 7 days. 12/12/21 12/19/21 Yes Noemi Chapel, MD  acetaminophen (TYLENOL) 500 MG tablet Take 500 mg by mouth every 6 (six) hours as needed. For pain    [provider]  aspirin EC 81 MG tablet Take 1 tablet (81 mg total) by mouth daily with breakfast. 06/22/21 06/22/22  Roxan Hockey, MD  donepezil (ARICEPT) 5 MG tablet TAKE 1 TABLET BY MOUTH AT BEDTIME 09/25/21   [name], MD  famotidine  (PEPCID) 20 MG tablet Take 20 mg by mouth daily.    [provider]  felodipine (PLENDIL) 5 MG 24 hr tablet Take 1 tablet (5 mg total) by mouth daily. 10/19/21   [name], MD  levothyroxine (SYNTHROID) 25 MCG tablet TAKE 1 TABLET BY MOUTH ONCE DAILY BEFORE BREAKFAST 11/28/21   [name], MD  simvastatin (ZOCOR) 40 MG tablet Take 1 tablet (40 mg total) by mouth daily. 04/24/21   [name], MD  valsartan (DIOVAN) 40 MG tablet Take 1 tablet (40 mg total) by mouth daily. 10/19/21   [name], MD      Allergies    Codeine    Review of Systems   Review of Systems  Cardiovascular:  Positive for near-syncope.  All other systems reviewed and are negative.   Physical Exam Updated Vital Signs BP (!) 177/92   Pulse 86   Resp 19   Ht 1.549 m ('5\' 1"'$ )   Wt 44.9 kg   SpO2 100%   BMI 18.71 kg/m  Physical Exam Vitals and nursing note reviewed.  Constitutional:      General: She is not in acute distress.    Appearance: She is well-developed.  HENT:     Head: Normocephalic and atraumatic.     Mouth/Throat:     Pharynx: No oropharyngeal exudate.  Eyes:  General: No scleral icterus.       Right eye: No discharge.        Left eye: No discharge.     Conjunctiva/sclera: Conjunctivae normal.     Pupils: Pupils are equal, round, and reactive to light.  Neck:     Thyroid: No thyromegaly.     Vascular: No JVD.  Cardiovascular:     Rate and Rhythm: Normal rate and regular rhythm.     Heart sounds: Normal heart sounds. No murmur heard.    No friction rub. No gallop.  Pulmonary:     Effort: Pulmonary effort is normal. No respiratory distress.     Breath sounds: Normal breath sounds. No wheezing or rales.  Abdominal:     General: Bowel sounds are normal. There is no distension.     Palpations: Abdomen is soft. There is no mass.     Tenderness: There is no abdominal tenderness.  Musculoskeletal:        General: No tenderness. Normal range of motion.     Cervical  back: Normal range of motion and neck supple.     Right lower leg: No edema.     Left lower leg: No edema.  Lymphadenopathy:     Cervical: No cervical adenopathy.  Skin:    General: Skin is warm and dry.     Findings: No erythema or rash.  Neurological:     Mental Status: She is alert.     Coordination: Coordination normal.     Comments: This patient is a pleasant demeanor, she is able to follow commands without difficulty, she is able to answer my questions appropriately when I ask her to count my fingers, her cranial nerves III through XII are normal, she has normal strength in all 4 extremities with equal grips, normal level of alertness and is very interactive and pleasant.  Psychiatric:        Behavior: Behavior normal.     ED Results / Procedures / Treatments   Labs (all labs ordered are listed, but only abnormal results are displayed) Labs Reviewed  CBC WITH DIFFERENTIAL/PLATELET - Abnormal; Notable for the following components:      Result Value   Neutro Abs 7.8 (*)    Lymphs Abs 0.4 (*)    All other components within normal limits  COMPREHENSIVE METABOLIC PANEL - Abnormal; Notable for the following components:   Glucose, Bld 119 (*)    BUN 30 (*)    Creatinine, Ser 1.54 (*)    GFR, Estimated 31 (*)    All other components within normal limits  URINALYSIS, ROUTINE W REFLEX MICROSCOPIC - Abnormal; Notable for the following components:   Color, Urine AMBER (*)    APPearance CLOUDY (*)    Hgb urine dipstick MODERATE (*)    Ketones, ur 20 (*)    Protein, ur 100 (*)    Leukocytes,Ua LARGE (*)    WBC, UA >50 (*)    Bacteria, UA MANY (*)    All other components within normal limits  CBG MONITORING, ED - Abnormal; Notable for the following components:   Glucose-Capillary 104 (*)    All other components within normal limits    EKG EKG Interpretation  Date/Time:  Wednesday December 12 2021 15:30:54 EST Ventricular Rate:  75 PR Interval:  214 QRS Duration: 80 QT  Interval:  390 QTC Calculation: 435 R Axis:   96 Text Interpretation:  Suspect arm lead reversal, interpretation assumes no reversal Sinus rhythm with 1st degree A-V  block Right atrial enlargement Rightward axis Pulmonary disease pattern Biventricular hypertrophy Septal infarct , age undetermined Abnormal ECG When compared with ECG of 21-Jun-2021 13:33, PREVIOUS ECG IS PRESENT Confirmed by Noemi Chapel [phone]) on 12/12/2021 3:54:03 PM  Radiology DG Chest Port 1 View  Result Date: 12/12/2021 CLINICAL DATA:  Near syncopal episode. EXAM: PORTABLE CHEST 1 VIEW COMPARISON:  06/21/2021 FINDINGS: The cardiac silhouette, mediastinal and hilar contours are within normal limits and stable. Stable large hiatal hernia. No acute pulmonary findings. No pleural effusions or pulmonary lesions. No pneumothorax. The bony thorax is intact. IMPRESSION: 1. No acute cardiopulmonary findings. 2. Large hiatal hernia. Electronically Signed   By: Marijo Sanes M.D.   On: 12/12/2021 16:25    Procedures Procedures    Medications Ordered in ED Medications  cefTRIAXone (ROCEPHIN) 1 g in sodium chloride 0.9 % 100 mL IVPB (has no administration in time range)  sodium chloride 0.9 % bolus 500 mL (500 mLs Intravenous New Bag/Given 12/12/21 1845)    ED Course/ Medical Decision Making/ A&P                           Medical Decision Making Amount and/or Complexity of Data Reviewed Labs: ordered. Radiology: ordered.  Risk Prescription drug management.   This patient presents to the ED for concern of altered mental status which has now resolved differential diagnosis includes hypoglycemia, infection, cardiac abnormalities, less likely to be neuro given the patient's normal exam at this time.    Additional history obtained:  Additional history obtained from electronic medical record External records from outside source obtained and reviewed including multiple prior work-ups in the past, the patient had office  visits recently in June for peripheral vascular disease, iron deficiency anemia and essential hypertension most recently in September.  She was admitted for syncope in June of this year without any acute findings during admission.  During that time the patient did have a possible urinary tract infection and was given cephalexin.  Nothing was found on cardiac monitoring during that stay   Lab Tests:  I Ordered, and personally interpreted labs.  The pertinent results include: Mild renal insufficiency, urinary tract infection present, no significant leukocytosis   Imaging Studies ordered:  I ordered imaging studies including chest x-ray I independently visualized and interpreted imaging which showed no acute findings, cardiomegaly present, hiatal hernia present I agree with the radiologist interpretation   Medicines ordered and prescription drug management:  I ordered medication including Rocephin and IV fluids for urinary infection Reevaluation of the patient after these medicines showed that the patient improved I have reviewed the patients home medicines and have made adjustments as needed   Problem List / ED Course:  The patient has had no further episodes of altered mental status, she received IV fluids and antibiotics for infection, she appears well, vital signs are unremarkable except for mild hypertension.  The patient is agreeable to come back if she needs it and will be sent home with cephalexin.  Family member at the bedside agreeable as well   Social Determinants of Health:  Dementia          Final Clinical Impression(s) / ED Diagnoses Final diagnoses:  Urinary tract infection without hematuria, site unspecified  Dehydration  Near syncope    Rx / DC Orders ED Discharge Orders          Ordered    cephALEXin (KEFLEX) 500 MG capsule  3 times daily  12/12/21 1902              Noemi Chapel, MD 12/12/21 1904

## 2021-12-12 NOTE — ED Notes (Addendum)
Pt had a brief episode of unresponsiveness and O2 sat of 79% on RA (good O2 sat pleth on monitor) while in triage. After a few seconds, pt started responding and when asked did she feel like she was about to pass out, pt mumbled "yes". Immediately afterwards, pt's O2 sat increased to 100% on RA.

## 2021-12-12 NOTE — ED Notes (Signed)
X-ray at bedside

## 2021-12-19 ENCOUNTER — Encounter (HOSPITAL_COMMUNITY): Payer: Self-pay | Admitting: *Deleted

## 2021-12-19 ENCOUNTER — Emergency Department (HOSPITAL_COMMUNITY): Payer: PPO

## 2021-12-19 ENCOUNTER — Other Ambulatory Visit: Payer: Self-pay

## 2021-12-19 ENCOUNTER — Inpatient Hospital Stay (HOSPITAL_COMMUNITY)
Admission: EM | Admit: 2021-12-19 | Discharge: 2021-12-23 | DRG: 067 | Disposition: A | Discharge status: Home / Self Care | Payer: PPO | Attending: Family Medicine | Admitting: Family Medicine

## 2021-12-19 DIAGNOSIS — A419 Sepsis, unspecified organism: Secondary | ICD-10-CM | POA: Diagnosis not present

## 2021-12-19 DIAGNOSIS — I2510 Atherosclerotic heart disease of native coronary artery without angina pectoris: Secondary | ICD-10-CM

## 2021-12-19 DIAGNOSIS — Z7982 Long term (current) use of aspirin: Secondary | ICD-10-CM

## 2021-12-19 DIAGNOSIS — D3A092 Benign carcinoid tumor of the stomach: Secondary | ICD-10-CM | POA: Diagnosis present

## 2021-12-19 DIAGNOSIS — E785 Hyperlipidemia, unspecified: Secondary | ICD-10-CM | POA: Diagnosis not present

## 2021-12-19 DIAGNOSIS — I10 Essential (primary) hypertension: Secondary | ICD-10-CM

## 2021-12-19 DIAGNOSIS — I2489 Other forms of acute ischemic heart disease: Secondary | ICD-10-CM | POA: Diagnosis not present

## 2021-12-19 DIAGNOSIS — K219 Gastro-esophageal reflux disease without esophagitis: Secondary | ICD-10-CM

## 2021-12-19 DIAGNOSIS — R4189 Other symptoms and signs involving cognitive functions and awareness: Secondary | ICD-10-CM | POA: Insufficient documentation

## 2021-12-19 DIAGNOSIS — B9689 Other specified bacterial agents as the cause of diseases classified elsewhere: Secondary | ICD-10-CM | POA: Diagnosis present

## 2021-12-19 DIAGNOSIS — K449 Diaphragmatic hernia without obstruction or gangrene: Secondary | ICD-10-CM | POA: Diagnosis not present

## 2021-12-19 DIAGNOSIS — G9340 Encephalopathy, unspecified: Secondary | ICD-10-CM | POA: Diagnosis present

## 2021-12-19 DIAGNOSIS — I6523 Occlusion and stenosis of bilateral carotid arteries: Secondary | ICD-10-CM | POA: Diagnosis not present

## 2021-12-19 DIAGNOSIS — N1832 Chronic kidney disease, stage 3b: Secondary | ICD-10-CM | POA: Diagnosis present

## 2021-12-19 DIAGNOSIS — R4182 Altered mental status, unspecified: Secondary | ICD-10-CM | POA: Diagnosis not present

## 2021-12-19 DIAGNOSIS — G301 Alzheimer's disease with late onset: Secondary | ICD-10-CM | POA: Diagnosis not present

## 2021-12-19 DIAGNOSIS — R569 Unspecified convulsions: Secondary | ICD-10-CM | POA: Diagnosis not present

## 2021-12-19 DIAGNOSIS — R1110 Vomiting, unspecified: Secondary | ICD-10-CM | POA: Diagnosis not present

## 2021-12-19 DIAGNOSIS — J9811 Atelectasis: Secondary | ICD-10-CM | POA: Diagnosis not present

## 2021-12-19 DIAGNOSIS — I6521 Occlusion and stenosis of right carotid artery: Principal | ICD-10-CM | POA: Diagnosis present

## 2021-12-19 DIAGNOSIS — R931 Abnormal findings on diagnostic imaging of heart and coronary circulation: Secondary | ICD-10-CM | POA: Diagnosis not present

## 2021-12-19 DIAGNOSIS — E872 Acidosis, unspecified: Secondary | ICD-10-CM | POA: Diagnosis not present

## 2021-12-19 DIAGNOSIS — Z79890 Hormone replacement therapy (postmenopausal): Secondary | ICD-10-CM

## 2021-12-19 DIAGNOSIS — Z79899 Other long term (current) drug therapy: Secondary | ICD-10-CM

## 2021-12-19 DIAGNOSIS — Z885 Allergy status to narcotic agent status: Secondary | ICD-10-CM

## 2021-12-19 DIAGNOSIS — I129 Hypertensive chronic kidney disease with stage 1 through stage 4 chronic kidney disease, or unspecified chronic kidney disease: Secondary | ICD-10-CM | POA: Diagnosis not present

## 2021-12-19 DIAGNOSIS — J189 Pneumonia, unspecified organism: Secondary | ICD-10-CM | POA: Diagnosis not present

## 2021-12-19 DIAGNOSIS — R918 Other nonspecific abnormal finding of lung field: Secondary | ICD-10-CM | POA: Diagnosis not present

## 2021-12-19 DIAGNOSIS — E039 Hypothyroidism, unspecified: Secondary | ICD-10-CM | POA: Diagnosis not present

## 2021-12-19 DIAGNOSIS — R404 Transient alteration of awareness: Secondary | ICD-10-CM | POA: Diagnosis not present

## 2021-12-19 DIAGNOSIS — J9601 Acute respiratory failure with hypoxia: Secondary | ICD-10-CM | POA: Diagnosis not present

## 2021-12-19 DIAGNOSIS — Z681 Body mass index (BMI) 19 or less, adult: Secondary | ICD-10-CM | POA: Diagnosis not present

## 2021-12-19 DIAGNOSIS — R059 Cough, unspecified: Secondary | ICD-10-CM | POA: Diagnosis not present

## 2021-12-19 DIAGNOSIS — R636 Underweight: Secondary | ICD-10-CM | POA: Diagnosis present

## 2021-12-19 DIAGNOSIS — N390 Urinary tract infection, site not specified: Secondary | ICD-10-CM

## 2021-12-19 DIAGNOSIS — E860 Dehydration: Secondary | ICD-10-CM | POA: Diagnosis not present

## 2021-12-19 DIAGNOSIS — F0390 Unspecified dementia without behavioral disturbance: Secondary | ICD-10-CM

## 2021-12-19 DIAGNOSIS — E782 Mixed hyperlipidemia: Secondary | ICD-10-CM

## 2021-12-19 DIAGNOSIS — I779 Disorder of arteries and arterioles, unspecified: Secondary | ICD-10-CM | POA: Diagnosis not present

## 2021-12-19 DIAGNOSIS — Z23 Encounter for immunization: Secondary | ICD-10-CM

## 2021-12-19 DIAGNOSIS — Z66 Do not resuscitate: Secondary | ICD-10-CM | POA: Diagnosis present

## 2021-12-19 DIAGNOSIS — R55 Syncope and collapse: Secondary | ICD-10-CM

## 2021-12-19 DIAGNOSIS — F0280 Dementia in other diseases classified elsewhere without behavioral disturbance: Secondary | ICD-10-CM | POA: Diagnosis not present

## 2021-12-19 DIAGNOSIS — R54 Age-related physical debility: Secondary | ICD-10-CM | POA: Diagnosis present

## 2021-12-19 DIAGNOSIS — Z8249 Family history of ischemic heart disease and other diseases of the circulatory system: Secondary | ICD-10-CM

## 2021-12-19 LAB — CBC WITH DIFFERENTIAL/PLATELET
Abs Immature Granulocytes: 0.04 10*3/uL (ref 0.00–0.07)
Basophils Absolute: 0 10*3/uL (ref 0.0–0.1)
Basophils Relative: 1 %
Eosinophils Absolute: 0 10*3/uL (ref 0.0–0.5)
Eosinophils Relative: 1 %
HCT: 42.8 % (ref 36.0–46.0)
Hemoglobin: 13.4 g/dL (ref 12.0–15.0)
Immature Granulocytes: 1 %
Lymphocytes Relative: 30 %
Lymphs Abs: 2.5 10*3/uL (ref 0.7–4.0)
MCH: 31.2 pg (ref 26.0–34.0)
MCHC: 31.3 g/dL (ref 30.0–36.0)
MCV: 99.5 fL (ref 80.0–100.0)
Monocytes Absolute: 0.6 10*3/uL (ref 0.1–1.0)
Monocytes Relative: 8 %
Neutro Abs: 5.1 10*3/uL (ref 1.7–7.7)
Neutrophils Relative %: 59 %
Platelets: 208 10*3/uL (ref 150–400)
RBC: 4.3 MIL/uL (ref 3.87–5.11)
RDW: 12.2 % (ref 11.5–15.5)
WBC: 8.4 10*3/uL (ref 4.0–10.5)
nRBC: 0 % (ref 0.0–0.2)

## 2021-12-19 LAB — COMPREHENSIVE METABOLIC PANEL
ALT: 10 U/L (ref 0–44)
AST: 25 U/L (ref 15–41)
Albumin: 3.9 g/dL (ref 3.5–5.0)
Alkaline Phosphatase: 94 U/L (ref 38–126)
Anion gap: 11 (ref 5–15)
BUN: 19 mg/dL (ref 8–23)
CO2: 24 mmol/L (ref 22–32)
Calcium: 8.9 mg/dL (ref 8.9–10.3)
Chloride: 105 mmol/L (ref 98–111)
Creatinine, Ser: 1.44 mg/dL — ABNORMAL HIGH (ref 0.44–1.00)
GFR, Estimated: 33 mL/min — ABNORMAL LOW (ref >60)
Glucose, Bld: 151 mg/dL — ABNORMAL HIGH (ref 70–99)
Potassium: 3.8 mmol/L (ref 3.5–5.1)
Sodium: 140 mmol/L (ref 135–145)
Total Bilirubin: 0.5 mg/dL (ref 0.3–1.2)
Total Protein: 7.5 g/dL (ref 6.5–8.1)

## 2021-12-19 LAB — URINALYSIS, ROUTINE W REFLEX MICROSCOPIC
Bilirubin Urine: NEGATIVE
Glucose, UA: NEGATIVE mg/dL
Ketones, ur: 5 mg/dL — AB
Nitrite: NEGATIVE
Protein, ur: 100 mg/dL — AB
Specific Gravity, Urine: 1.02 (ref 1.005–1.030)
pH: 6 (ref 5.0–8.0)

## 2021-12-19 LAB — CREATININE, SERUM
Creatinine, Ser: 1.31 mg/dL — ABNORMAL HIGH (ref 0.44–1.00)
GFR, Estimated: 37 mL/min — ABNORMAL LOW (ref >60)

## 2021-12-19 LAB — I-STAT CHEM 8, ED
BUN: 18 mg/dL (ref 8–23)
Calcium, Ion: 1.18 mmol/L (ref 1.15–1.40)
Chloride: 105 mmol/L (ref 98–111)
Creatinine, Ser: 1.4 mg/dL — ABNORMAL HIGH (ref 0.44–1.00)
Glucose, Bld: 150 mg/dL — ABNORMAL HIGH (ref 70–99)
HCT: 44 % (ref 36.0–46.0)
Hemoglobin: 15 g/dL (ref 12.0–15.0)
Potassium: 3.9 mmol/L (ref 3.5–5.1)
Sodium: 142 mmol/L (ref 135–145)
TCO2: 26 mmol/L (ref 22–32)

## 2021-12-19 LAB — LACTIC ACID, PLASMA
Lactic Acid, Venous: 2.1 mmol/L (ref 0.5–1.9)
Lactic Acid, Venous: 2.5 mmol/L (ref 0.5–1.9)
Lactic Acid, Venous: 1.7 mmol/L (ref 0.5–1.9)

## 2021-12-19 LAB — TROPONIN I (HIGH SENSITIVITY)
Troponin I (High Sensitivity): 21 ng/L — ABNORMAL HIGH (ref <18)
Troponin I (High Sensitivity): 20 ng/L — ABNORMAL HIGH (ref <18)

## 2021-12-19 LAB — BLOOD GAS, ARTERIAL
Acid-Base Excess: 3.3 mmol/L — ABNORMAL HIGH (ref 0.0–2.0)
Bicarbonate: 27.8 mmol/L (ref 20.0–28.0)
Drawn by: 23430
O2 Saturation: 100 %
Patient temperature: 35.7
pCO2 arterial: 39 mmHg (ref 32–48)
pH, Arterial: 7.46 — ABNORMAL HIGH (ref 7.35–7.45)
pO2, Arterial: 368 mmHg — ABNORMAL HIGH (ref 83–108)

## 2021-12-19 MED ORDER — SODIUM CHLORIDE 0.9 % IV SOLN
500.0000 mg | INTRAVENOUS | Status: DC
  Administered 2021-12-19 – 2021-12-20 (×2): 500 mg via INTRAVENOUS
  Filled 2021-12-19 (×2): qty 5

## 2021-12-19 MED ORDER — ONDANSETRON HCL 4 MG PO TABS: 4.0000 mg | ORAL_TABLET | Freq: Four times a day (QID) | ORAL | Status: DC | PRN

## 2021-12-19 MED ORDER — ACETAMINOPHEN 325 MG PO TABS: 650.0000 mg | ORAL_TABLET | Freq: Four times a day (QID) | ORAL | Status: DC | PRN

## 2021-12-19 MED ORDER — SODIUM CHLORIDE 0.9 % IV SOLN: 1.5000 g | Freq: Once | INTRAVENOUS | Status: DC

## 2021-12-19 MED ORDER — DM-GUAIFENESIN ER 30-600 MG PO TB12
1.0000 | ORAL_TABLET | Freq: Two times a day (BID) | ORAL | Status: DC
  Administered 2021-12-19 – 2021-12-23 (×8): 1 via ORAL
  Filled 2021-12-19 (×9): qty 1

## 2021-12-19 MED ORDER — INFLUENZA VAC A&B SA ADJ QUAD 0.5 ML IM PRSY
0.5000 mL | PREFILLED_SYRINGE | INTRAMUSCULAR | Status: AC
  Administered 2021-12-20: 0.5 mL via INTRAMUSCULAR
  Filled 2021-12-19: qty 0.5

## 2021-12-19 MED ORDER — ACETAMINOPHEN 650 MG RE SUPP: 650.0000 mg | Freq: Four times a day (QID) | RECTAL | Status: DC | PRN

## 2021-12-19 MED ORDER — ONDANSETRON HCL 4 MG/2ML IJ SOLN: 4.0000 mg | Freq: Four times a day (QID) | INTRAMUSCULAR | Status: DC | PRN

## 2021-12-19 MED ORDER — SODIUM CHLORIDE 0.9 % IV SOLN
1.0000 g | Freq: Once | INTRAVENOUS | Status: AC
  Administered 2021-12-19: 1 g via INTRAVENOUS
  Filled 2021-12-19: qty 10

## 2021-12-19 MED ORDER — SODIUM CHLORIDE 0.9 % IV SOLN: INTRAVENOUS | Status: AC

## 2021-12-19 MED ORDER — ENOXAPARIN SODIUM 30 MG/0.3ML IJ SOSY
30.0000 mg | PREFILLED_SYRINGE | INTRAMUSCULAR | Status: DC
  Administered 2021-12-19 – 2021-12-22 (×4): 30 mg via SUBCUTANEOUS
  Filled 2021-12-19 (×4): qty 0.3

## 2021-12-19 MED ORDER — SODIUM CHLORIDE 0.9 % IV SOLN
3.0000 g | Freq: Two times a day (BID) | INTRAVENOUS | Status: DC
  Administered 2021-12-20: 3 g via INTRAVENOUS
  Filled 2021-12-19 (×2): qty 8

## 2021-12-19 MED ORDER — ONDANSETRON HCL 4 MG/2ML IJ SOLN
4.0000 mg | Freq: Once | INTRAMUSCULAR | Status: AC
  Administered 2021-12-19: 4 mg via INTRAVENOUS

## 2021-12-19 NOTE — ED Triage Notes (Signed)
Son brought pt in due to pt becoming unresponsive while in car;  Son states they were on way home from out eating and he looked over and found pt slumped over in car  Pt vomiting and not responding to voice, only pain  Pt was seen here last week and diagnosed with UTI

## 2021-12-19 NOTE — Progress Notes (Signed)
PHARMACY NOTE:  ANTIMICROBIAL RENAL DOSAGE ADJUSTMENT  Current antimicrobial regimen includes a mismatch between antimicrobial dosage and estimated renal function.  As per policy approved by the Pharmacy & Therapeutics and Medical Executive Committees, the antimicrobial dosage will be adjusted accordingly.  Current antimicrobial dosage:  Unasyn 3gm IV q6h  Indication: aspiration pneumonia  Renal Function:  Estimated Creatinine Clearance: 15.8 mL/min (A) (by C-G formula based on SCr of 1.44 mg/dL (H)). '[]'$      On intermittent HD, scheduled: '[]'$      On CRRT    Antimicrobial dosage has been changed to:  Unasyn 3gm IV q12h.  Additional comments:  Unasyn 1.5 gm IV x 1 ordered within the last hour but not yet given.  Order cancelled.   To begin Unasyn 3 gm IV q12h tonight.   Thank you for allowing pharmacy to be a part of this patient's care.  Arty Baumgartner, [facility] 12/19/2021 9:52 PM

## 2021-12-19 NOTE — H&P (Signed)
History and Physical    Patient: [name] DJS:[id number] DOB: Jul 04, 1922 DOA: 12/19/2021 DOS: the patient was seen and examined on 12/19/2021 PCP: [name], MD  Patient coming from: Home  Chief Complaint:  Chief Complaint  Patient presents with   Altered Mental Status   HPI: [name] is a 88 y.o. female with medical history significant of Hypertension, dementia, hypothyroidism, hyperlipidemia, GERD who presents to the emergency department accompanied by son due to an episode of sudden unresponsiveness.  She was unable to provide history, history was obtained from ED physician and ED medical record.  Per report, patient went out to eat with son at a restaurant in [place], on the way back, she suddenly slumped over in the front seat of the car and became unresponsive, she woke up and son brought her to the ER, while bringing her out of the car into a wheelchair, she became limp and she was lowered to the ground without hitting her head, when she wakes up, she started to vomit while being taken into the ER. She presented to the ED on 12/12/2021 due to similar presentation, she was diagnosed with near syncope, UTI and was treated with IV ceftriaxone and IV hydration due to dehydration.  Keflex was prescribed on being discharged from the ED. Patient was admitted from 6/1 to 06/22/2021 due to similar presentation in which she suddenly became unresponsive, several studies done at that time including CT head showed no acute findings, carotid artery Dopplers showed severe stenosis, chest x-ray at that time was normal, UDS negative, but urinalysis was suggestive of possible UTI.  Echo on 6/2 showed LVEF of 65 to 70%.  No RWMA, moderate LVH, G1 DD, EKG showed no acute findings.  ED Course:  In the emergency department, temperature was 96.3, respiration 26/min, BP 161/78, pulse 86 bpm, O2 sat 100% on NRB at 15 L.  Workup in the ED showed normal CBC, BMP was normal except for blood  glucose of 151, creatinine 1.44 and EGFR of 33.  Blood culture pending CT head without contrast showed no acute intracranial abnormality Chest x-ray showed small airspace opacity within the inferior aspect of the right upper lobe, suspicious for pneumonia.  Large hiatal hernia Patient was treated with Unasyn, ceftriaxone and Zofran was given.  Hospitalist was asked to admit patient for further evaluation and management.   Review of Systems: Review of systems as noted in the HPI. All other systems reviewed and are negative.   Past Medical History:  Diagnosis Date   Adult BMI 19-24 kg/sq m 2008 125 lbs   Anemia    Pernicious 2008-HB 12 MCV 119.5; JAN 2012 HB 13.2 MCV 97.6   Carcinoid tumor of stomach 2008   Diverticulosis    GERD (gastroesophageal reflux disease)    Hiatal hernia    HTN (hypertension)    Hyperlipidemia    Hypertension    Phreesia 02/07/2020   Mild memory loss 02/15/2011   Near syncope 04/18/2014   Pernicious anemia    Pernicious anemia 06/14/2010   Wrist fracture, left 2008   Past Surgical History:  Procedure Laterality Date   COLONOSCOPY  2003 LS   DC/[place] Diverticulosis   EUS  2008 DJ   FNA NO CARCINOID   EUS  JAN 2009   NL EXAM, NO Bx   EUS  [facility] 2011 WO   CARCINOID   UPPER GASTROINTESTINAL ENDOSCOPY  APR 2008 SLF   CARCIONOID   UPPER GASTROINTESTINAL ENDOSCOPY  FEB 2011  SLF   CARCINOID/mild anemia/large hiatal hernia    Social History:  reports that she has never smoked. She has never used smokeless tobacco. She reports that she does not drink alcohol and does not use drugs.   Allergies  Allergen Reactions   Codeine     Family History  Problem Relation Age of Onset   Cancer Mother    Heart disease Brother    Kidney disease Son    Colon cancer Neg Hx    Colon polyps Neg Hx     ***  Prior to Admission medications   Medication Sig Start Date End Date Taking? Authorizing Provider  acetaminophen (TYLENOL) 500 MG tablet Take 500 mg by mouth  every 6 (six) hours as needed. For pain   Yes [provider]  aspirin EC 81 MG tablet Take 1 tablet (81 mg total) by mouth daily with breakfast. 06/22/21 06/22/22 Yes Emokpae, Courage, MD  cephALEXin (KEFLEX) 500 MG capsule Take 1 capsule (500 mg total) by mouth 3 (three) times daily for 7 days. 12/12/21 12/19/21 Yes Noemi Chapel, MD  donepezil (ARICEPT) 5 MG tablet TAKE 1 TABLET BY MOUTH AT BEDTIME 09/25/21  Yes [name], MD  famotidine (PEPCID) 20 MG tablet Take 20 mg by mouth daily.   Yes [provider]  felodipine (PLENDIL) 5 MG 24 hr tablet Take 1 tablet (5 mg total) by mouth daily. 10/19/21  Yes [name], MD  levothyroxine (SYNTHROID) 25 MCG tablet TAKE 1 TABLET BY MOUTH ONCE DAILY BEFORE BREAKFAST 11/28/21  Yes [name], MD  simvastatin (ZOCOR) 40 MG tablet Take 1 tablet (40 mg total) by mouth daily. 04/24/21  Yes [name], MD  valsartan (DIOVAN) 40 MG tablet Take 1 tablet (40 mg total) by mouth daily. 10/19/21  Yes [name], MD  AREXVY 120 MCG/0.5ML injection  09/20/21   [provider]    Physical Exam: BP (!) 143/83   Pulse 83   Temp 97.7 F (36.5 C)   Resp 16   Ht _0  (1.549 m)   Wt 44.9 kg   SpO2 100%   BMI 18.70 kg/m   General: 88 y.o. year-old female well developed well nourished in no acute distress.  Alert and oriented x3. HEENT: NCAT, EOMI Neck: Supple, trachea medial Cardiovascular: Regular rate and rhythm with no rubs or gallops.  No thyromegaly or JVD noted.  No lower extremity edema. 2/4 pulses in all 4 extremities. Respiratory: Clear to auscultation with no wheezes or rales. Good inspiratory effort. Abdomen: Soft, nontender nondistended with normal bowel sounds x4 quadrants. Muskuloskeletal: No cyanosis, clubbing or edema noted bilaterally Neuro: CN II-XII intact, strength 5/5 x 4, sensation, reflexes intact Skin: No ulcerative lesions noted or rashes Psychiatry: Judgement and insight appear normal. Mood is  appropriate for condition and setting          Labs on Admission:  Basic Metabolic Panel: Recent Labs  Lab 12/19/21 1721 12/19/21 1726  NA 142 140  K 3.9 3.8  CL 105 105  CO2  --  24  GLUCOSE 150* 151*  BUN 18 19  CREATININE 1.40* 1.44*  CALCIUM  --  8.9   Liver Function Tests: Recent Labs  Lab 12/19/21 1726  AST 25  ALT 10  ALKPHOS 94  BILITOT 0.5  PROT 7.5  ALBUMIN 3.9   No results for input(s): "LIPASE", "AMYLASE" in the last 168 hours. No results for input(s): "AMMONIA" in the last 168 hours. CBC: Recent  Labs  Lab 12/19/21 1721 12/19/21 1726  WBC  --  8.4  NEUTROABS  --  5.1  HGB 15.0 13.4  HCT 44.0 42.8  MCV  --  99.5  PLT  --  208   Cardiac Enzymes: No results for input(s): "CKTOTAL", "CKMB", "CKMBINDEX", "TROPONINI" in the last 168 hours.  BNP (last 3 results) No results for input(s): "BNP" in the last 8760 hours.  ProBNP (last 3 results) No results for input(s): "PROBNP" in the last 8760 hours.  CBG: No results for input(s): "GLUCAP" in the last 168 hours.  Radiological Exams on Admission: CT Head Wo Contrast  Result Date: 12/19/2021 CLINICAL DATA:  Altered mental status, nontraumatic (Ped 0-17y) EXAM: CT HEAD WITHOUT CONTRAST TECHNIQUE: Contiguous axial images were obtained from the base of the skull through the vertex without intravenous contrast. RADIATION DOSE REDUCTION: This exam was performed according to the departmental dose-optimization program which includes automated exposure control, adjustment of the mA and/or kV according to patient size and/or use of iterative reconstruction technique. COMPARISON:  CT HEAD 06/21/2021 BRAIN: BRAIN Cerebral ventricle sizes are concordant with the degree of cerebral volume loss. Patchy and confluent areas of decreased attenuation are noted throughout the deep and periventricular white matter of the cerebral hemispheres bilaterally, compatible with chronic microvascular ischemic disease. No evidence of  large-territorial acute infarction. No parenchymal hemorrhage. No mass lesion. No extra-axial collection. No mass effect or midline shift. No hydrocephalus. Basilar cisterns are patent. Vascular: No hyperdense vessel. Atherosclerotic calcifications are present within the cavernous internal carotid AND VERTEBRAL arteries. Skull: No acute fracture or focal lesion. Sinuses/Orbits: BILATERAL MAXILLARY MUCOSAL THICKENING. OTHERWISE paranasal sinuses and mastoid air cells are clear. The orbits are unremarkable. Other: None. IMPRESSION: No acute intracranial abnormality. Electronically Signed   By: Iven Finn M.D.   On: 12/19/2021 20:59   DG Chest Port 1 View  Result Date: 12/19/2021 CLINICAL DATA:  Cough, vomiting EXAM: PORTABLE CHEST 1 VIEW COMPARISON:  12/12/2021 FINDINGS: Stable cardiomediastinal contours. Large hiatal hernia. Aortic atherosclerosis. Hyperinflated lungs with coarsened interstitial markings. Small airspace opacity within the inferior aspect of the right upper lobe. No pleural effusion or pneumothorax. IMPRESSION: 1. Small airspace opacity within the inferior aspect of the right upper lobe, suspicious for pneumonia. 2. Large hiatal hernia. Electronically Signed   By: Davina Poke D.O.   On: 12/19/2021 17:36    EKG: I independently viewed the EKG done and my findings are as followed: Normal sinus rhythm at a rate of 88 bpm with increased PR interval  Assessment/Plan Present on Admission:  CAP (community acquired pneumonia)  Carotid disease, bilateral ([place])  GERD  Mixed hyperlipidemia  Acquired hypothyroidism  Essential hypertension  Late onset Alzheimer's dementia without behavioral disturbance ([place])  Syncope and collapse  Principal Problem:   CAP (community acquired pneumonia) Active Problems:   Carotid disease, bilateral ([place])   Mixed hyperlipidemia   Essential hypertension   GERD   Syncope and collapse   Acquired hypothyroidism   Late onset Alzheimer's dementia  without behavioral disturbance ([facility])   Acute respiratory failure with hypoxia ([facility])   Lactic acidosis   UTI (urinary tract infection)   Possible CAP POA Chest x-ray was suggestive of right upper lobe pneumonia She was reported to start vomiting after recovering from sudden onset of unresponsiveness- ?possible aspiration Patient was started on Unasyn and ceftriaxone, we shall continue with Unasyn and azithromycin at this time with plan to de-escalate/discontinue based on blood culture, sputum culture, urine Legionella, strep pneumo and procalcitonin  Continue Tylenol as needed Continue Mucinex, incentive spirometry, flutter valve   Acute respiratory failure with hypoxia This is possibly secondary to above Continue supplemental oxygen to maintain O2 sat > 94% with plan to wean patient off supplemental oxygen  Presumed UTI POA Urine culture done on 06/21/2021 was positive for E. coli which was sensitive to Unasyn and ceftriaxone Patient was started on ceftriaxone, continue with Unasyn Urine culture pending  Syncopal episode Continue telemetry and watch for arrhythmias Troponins x 1 was 21, patient denies chest pain.  Continue to trend troponin EKG showed normal sinus rhythm at a rate of 88 bpm with prolonged PR interval Echocardiogram done on 6/2 showed LVEF of 65 to 70%.  No RWMA, moderate LVH, G1 DD, Echocardiogram will be done to rule out significant aortic stenosis or other outflow obstruction, and also to evaluate EF and to rule out segmental/Regional wall motion abnormalities.  Carotid artery Dopplers done 06/21/2021 showed severe (70-99%) stenosis proximal right internal carotid artery secondary to bulky heterogeneous atherosclerotic plaque. Plaque burden is largely in the carotid bifurcation and extending into the internal and external carotid arteries. This was discussed with patient's and son at that time, but they did not want overtly aggressive intervention.  Lactic  acidosis Lactic acid 2.1 > 2.5, this may be due to multifactorial including hypoxia Continue to trend lactic acid  Elevated troponin Troponin x 1 - 21, patient denies chest pain Continue to trend troponin  Essential hypertension Continue felodipine and Avapro  Mixed hyperlipidemia Continue Zocor  Acquired hypothyroidism Continue Synthroid  GERD Continue Pepcid  Dementia Continue Aricept  DVT prophylaxis: Lovenox  Code Status: DNR  Family Communication: None at bedside  Consults: None  Severity of Illness: The appropriate patient status for this patient is INPATIENT. Inpatient status is judged to be reasonable and necessary in order to provide the required intensity of service to ensure the patient's safety. The patient's presenting symptoms, physical exam findings, and initial radiographic and laboratory data in the context of their chronic comorbidities is felt to place them at high risk for further clinical deterioration. Furthermore, it is not anticipated that the patient will be medically stable for discharge from the hospital within 2 midnights of admission.   * I certify that at the point of admission it is my clinical judgment that the patient will require inpatient hospital care spanning beyond 2 midnights from the point of admission due to high intensity of service, high risk for further deterioration and high frequency of surveillance required.*  Author: Bernadette Hoit, DO 12/19/2021 10:05 PM  For on call review [email or web address].

## 2021-12-19 NOTE — ED Provider Notes (Signed)
[facility] EMERGENCY DEPARTMENT Provider Note   CSN: [id number] Arrival date & time: 12/19/21  1701     History  No chief complaint on file.   SCHERYL SANBORN is a 88 y.o. female.  HPI   This is a 88 year old female, history of hypertension, dementia, hypothyroidism and high cholesterol presenting in the care of her son after she was out eating lunch with him in [place].  They were on the way home when she slumped over in the front seat of the car became unresponsive.  He diverted to the hospital for evaluation and on arrival they had to bring the patient back unresponsive from the waiting room.  Vomiting, still breathing, the patient is not able answer questions.  Review of the medical record shows that the patient was seen on December 12, 2021.  I actually saw the patient at that time and the patient was very well-appearing despite having a brief episode where she was sleeping and slumped over in the car when he went into the pharmacy to get some medication.  She had an unremarkable exam and workup and other than a urinary tract infection which was seen on urinalysis the patient had no other findings and did very well.  She was prescribed cephalexin for the urinary tract infection which she reports she has been taking  Home Medications Prior to Admission medications   Medication Sig Start Date End Date Taking? Authorizing Provider  acetaminophen (TYLENOL) 500 MG tablet Take 500 mg by mouth every 6 (six) hours as needed. For pain    [provider]  aspirin EC 81 MG tablet Take 1 tablet (81 mg total) by mouth daily with breakfast. 06/22/21 06/22/22  Roxan Hockey, MD  cephALEXin (KEFLEX) 500 MG capsule Take 1 capsule (500 mg total) by mouth 3 (three) times daily for 7 days. 12/12/21 12/19/21  Noemi Chapel, MD  donepezil (ARICEPT) 5 MG tablet TAKE 1 TABLET BY MOUTH AT BEDTIME 09/25/21   [name], MD  famotidine (PEPCID) 20 MG tablet Take 20 mg by mouth daily.     [provider]  felodipine (PLENDIL) 5 MG 24 hr tablet Take 1 tablet (5 mg total) by mouth daily. 10/19/21   [name], MD  levothyroxine (SYNTHROID) 25 MCG tablet TAKE 1 TABLET BY MOUTH ONCE DAILY BEFORE BREAKFAST 11/28/21   [name], MD  simvastatin (ZOCOR) 40 MG tablet Take 1 tablet (40 mg total) by mouth daily. 04/24/21   [name], MD  valsartan (DIOVAN) 40 MG tablet Take 1 tablet (40 mg total) by mouth daily. 10/19/21   [name], MD      Allergies    Codeine    Review of Systems   Review of Systems  Unable to perform ROS: Acuity of condition    Physical Exam Updated Vital Signs There were no vitals taken for this visit. Physical Exam Vitals and nursing note reviewed.  Constitutional:      General: She is in acute distress.     Appearance: She is well-developed. She is ill-appearing and toxic-appearing.  HENT:     Head: Normocephalic and atraumatic.     Nose: No congestion or rhinorrhea.     Mouth/Throat:     Pharynx: No oropharyngeal exudate.  Eyes:     General: No scleral icterus.       Right eye: No discharge.        Left eye: No discharge.     Conjunctiva/sclera: Conjunctivae normal.  Pupils: Pupils are equal, round, and reactive to light.  Neck:     Thyroid: No thyromegaly.     Vascular: No JVD.  Cardiovascular:     Rate and Rhythm: Normal rate and regular rhythm.     Heart sounds: Normal heart sounds. No murmur heard.    No friction rub. No gallop.  Pulmonary:     Effort: Pulmonary effort is normal. No respiratory distress.     Breath sounds: Normal breath sounds. No wheezing or rales.  Abdominal:     General: Bowel sounds are normal. There is no distension.     Palpations: Abdomen is soft. There is no mass.     Tenderness: There is no abdominal tenderness.  Musculoskeletal:        General: No tenderness. Normal range of motion.     Cervical back: Normal range of motion and neck supple.     Right lower leg: No edema.      Left lower leg: No edema.  Lymphadenopathy:     Cervical: No cervical adenopathy.  Skin:    General: Skin is warm and dry.     Findings: No erythema or rash.  Neurological:     Comments: The patient is virtually unresponsive, she responds to painful stimuli, opens her mouth and takes the occasional breath, she has a repetitive motion of her teeth in the chattering motion, dentures were taken out, nothing else was in the mouth  Psychiatric:        Behavior: Behavior normal.     ED Results / Procedures / Treatments   Labs (all labs ordered are listed, but only abnormal results are displayed) Labs Reviewed  CULTURE, BLOOD (ROUTINE X 2)  CULTURE, BLOOD (ROUTINE X 2)  URINE CULTURE  CBC WITH DIFFERENTIAL/PLATELET  COMPREHENSIVE METABOLIC PANEL  LACTIC ACID, PLASMA  LACTIC ACID, PLASMA  URINALYSIS, ROUTINE W REFLEX MICROSCOPIC  TROPONIN I (HIGH SENSITIVITY)    EKG None  Radiology No results found.  Procedures Procedures    Medications Ordered in ED Medications - No data to display  ED Course/ Medical Decision Making/ A&P                           Medical Decision Making Amount and/or Complexity of Data Reviewed Labs: ordered. Radiology: ordered.  Risk Prescription drug management. Decision regarding hospitalization.   This patient presents to the ED for concern of recurrent altered mental status, this involves an extensive number of treatment options, and is a complaint that carries with it a high risk of complications and morbidity.  The differential diagnosis includes sepsis, recurrent UTI, electrolyte abnormalities, renal dysfunction, stroke, seizure   Co morbidities that complicate the patient evaluation  Recent evaluation, advanced age, I had a discussion with the family member, the son at the bedside and he reports that she would be a DO NOT RESUSCITATE.  This means no CPR, no intubation, no defibrillation   Additional history obtained:  Additional  history obtained from electronic medical record External records from outside source obtained and reviewed including prior medications including cephalexin prescribed approximately 1 week ago   Lab Tests:  I Ordered, and personally interpreted labs.  The pertinent results include: Lactate of 2.1 which rose to 2.5, troponin was stable at 20, creatinine was 1.3, urinalysis with possible infection, no leukocytosis, ABG with no significant acidosis, CT head is negative, possible pneumonia   Imaging Studies ordered:  I ordered imaging studies including chest x-ray  I independently visualized and interpreted imaging which showed possible pneumonia, CT head negative I agree with the radiologist interpretation   Cardiac Monitoring: / EKG:  The patient was maintained on a cardiac monitor.  I personally viewed and interpreted the cardiac monitored which showed an underlying rhythm of: Normal sinus rhythm   Consultations Obtained:  I requested consultation with the hospitalist,  and discussed lab and imaging findings as well as pertinent plan - they recommend: Admission   Problem List / ED Course / Critical interventions / Medication management  Patient will need antibiotics  I have reviewed the patients home medicines and have made adjustments as needed   Social Determinants of Health:  Antibiotics and admission, patient is very elderly lives with her son and is now DNR based on his report   Test / Admission - Considered:  Admit to hospital, acute encephalopathy        Final Clinical Impression(s) / ED Diagnoses Final diagnoses:  Acute encephalopathy  Community acquired pneumonia of right lung, unspecified part of lung    Rx / DC Orders ED Discharge Orders     None         Noemi Chapel, MD 12/21/21 1511

## 2021-12-19 NOTE — ED Triage Notes (Signed)
From [place]- pt was with her son and while riding in car, slumped over in passenger seat. Son stated she woke and he brought her to ER. As he was getting her out of the car into a wheelchair, he stated she went limp and he lowered her to the ground. He stated she did not hit her head. Pt awake, but vomitied while going to room.

## 2021-12-20 ENCOUNTER — Inpatient Hospital Stay (HOSPITAL_COMMUNITY): Payer: PPO

## 2021-12-20 ENCOUNTER — Other Ambulatory Visit (HOSPITAL_COMMUNITY): Payer: Self-pay | Admitting: *Deleted

## 2021-12-20 ENCOUNTER — Inpatient Hospital Stay (HOSPITAL_COMMUNITY)
Admit: 2021-12-20 | Discharge: 2021-12-20 | Disposition: A | Discharge status: Home / Self Care | Payer: PPO | Attending: Neurology | Admitting: Neurology

## 2021-12-20 DIAGNOSIS — R4189 Other symptoms and signs involving cognitive functions and awareness: Secondary | ICD-10-CM

## 2021-12-20 DIAGNOSIS — E872 Acidosis, unspecified: Secondary | ICD-10-CM | POA: Diagnosis not present

## 2021-12-20 DIAGNOSIS — N1832 Chronic kidney disease, stage 3b: Secondary | ICD-10-CM

## 2021-12-20 DIAGNOSIS — F0390 Unspecified dementia without behavioral disturbance: Secondary | ICD-10-CM | POA: Diagnosis not present

## 2021-12-20 DIAGNOSIS — R55 Syncope and collapse: Secondary | ICD-10-CM

## 2021-12-20 LAB — ECHOCARDIOGRAM LIMITED
AR max vel: 1.75 cm2
AV Area VTI: 1.76 cm2
AV Area mean vel: 1.9 cm2
AV Mean grad: 4 mmHg
AV Peak grad: 8.6 mmHg
Ao pk vel: 1.47 m/s
Height: 61 in
S' Lateral: 2.2 cm
Weight: 1583.78 oz

## 2021-12-20 LAB — TROPONIN I (HIGH SENSITIVITY): Troponin I (High Sensitivity): 19 ng/L — ABNORMAL HIGH (ref <18)

## 2021-12-20 LAB — BASIC METABOLIC PANEL
Anion gap: 6 (ref 5–15)
BUN: 18 mg/dL (ref 8–23)
CO2: 26 mmol/L (ref 22–32)
Calcium: 8.3 mg/dL — ABNORMAL LOW (ref 8.9–10.3)
Chloride: 108 mmol/L (ref 98–111)
Creatinine, Ser: 1.27 mg/dL — ABNORMAL HIGH (ref 0.44–1.00)
GFR, Estimated: 38 mL/min — ABNORMAL LOW (ref >60)
Glucose, Bld: 91 mg/dL (ref 70–99)
Potassium: 4.3 mmol/L (ref 3.5–5.1)
Sodium: 140 mmol/L (ref 135–145)

## 2021-12-20 LAB — PHOSPHORUS: Phosphorus: 3.6 mg/dL (ref 2.5–4.6)

## 2021-12-20 LAB — CBC
HCT: 34.5 % — ABNORMAL LOW (ref 36.0–46.0)
Hemoglobin: 10.9 g/dL — ABNORMAL LOW (ref 12.0–15.0)
MCH: 31.7 pg (ref 26.0–34.0)
MCHC: 31.6 g/dL (ref 30.0–36.0)
MCV: 100.3 fL — ABNORMAL HIGH (ref 80.0–100.0)
Platelets: 163 10*3/uL (ref 150–400)
RBC: 3.44 MIL/uL — ABNORMAL LOW (ref 3.87–5.11)
RDW: 12.3 % (ref 11.5–15.5)
WBC: 6.8 10*3/uL (ref 4.0–10.5)
nRBC: 0 % (ref 0.0–0.2)

## 2021-12-20 LAB — LACTIC ACID, PLASMA: Lactic Acid, Venous: 1.5 mmol/L (ref 0.5–1.9)

## 2021-12-20 LAB — PROCALCITONIN: Procalcitonin: <0.1 ng/mL

## 2021-12-20 LAB — MAGNESIUM: Magnesium: 1.9 mg/dL (ref 1.7–2.4)

## 2021-12-20 LAB — STREP PNEUMONIAE URINARY ANTIGEN: Strep Pneumo Urinary Antigen: NEGATIVE

## 2021-12-20 MED ORDER — ASPIRIN 81 MG PO TBEC
81.0000 mg | DELAYED_RELEASE_TABLET | Freq: Every day | ORAL | Status: DC
  Administered 2021-12-20 – 2021-12-23 (×4): 81 mg via ORAL
  Filled 2021-12-20 (×4): qty 1

## 2021-12-20 MED ORDER — CLOPIDOGREL BISULFATE 75 MG PO TABS
75.0000 mg | ORAL_TABLET | Freq: Every day | ORAL | Status: DC
  Administered 2021-12-20 – 2021-12-23 (×4): 75 mg via ORAL
  Filled 2021-12-20 (×4): qty 1

## 2021-12-20 MED ORDER — FELODIPINE ER 5 MG PO TB24
5.0000 mg | ORAL_TABLET | Freq: Every day | ORAL | Status: DC
  Administered 2021-12-20 – 2021-12-22 (×3): 5 mg via ORAL
  Filled 2021-12-20 (×6): qty 1

## 2021-12-20 MED ORDER — IRBESARTAN 75 MG PO TABS
75.0000 mg | ORAL_TABLET | Freq: Every day | ORAL | Status: DC
  Administered 2021-12-20 – 2021-12-23 (×4): 75 mg via ORAL
  Filled 2021-12-20 (×4): qty 1

## 2021-12-20 MED ORDER — FAMOTIDINE 20 MG PO TABS
20.0000 mg | ORAL_TABLET | Freq: Every day | ORAL | Status: DC
  Administered 2021-12-20 – 2021-12-23 (×4): 20 mg via ORAL
  Filled 2021-12-20 (×4): qty 1

## 2021-12-20 MED ORDER — DONEPEZIL HCL 5 MG PO TABS
5.0000 mg | ORAL_TABLET | Freq: Every day | ORAL | Status: DC
  Administered 2021-12-20 – 2021-12-22 (×3): 5 mg via ORAL
  Filled 2021-12-20 (×3): qty 1

## 2021-12-20 MED ORDER — SIMVASTATIN 20 MG PO TABS
40.0000 mg | ORAL_TABLET | Freq: Every day | ORAL | Status: DC
  Administered 2021-12-20 – 2021-12-23 (×4): 40 mg via ORAL
  Filled 2021-12-20 (×4): qty 2

## 2021-12-20 MED ORDER — LEVOTHYROXINE SODIUM 25 MCG PO TABS
25.0000 ug | ORAL_TABLET | Freq: Every day | ORAL | Status: DC
  Administered 2021-12-20 – 2021-12-23 (×4): 25 ug via ORAL
  Filled 2021-12-20 (×4): qty 1

## 2021-12-20 NOTE — Progress Notes (Addendum)
PROGRESS NOTE  [name] VQM:[id number] DOB: 09/03/1922 DOA: 12/19/2021 PCP: [name], MD  Brief History:  88 year old female with a history of cognitive impairment, hypertension, hyperlipidemia, hypothyroidism, B12 and iron deficiency presenting with transient episode of unresponsiveness.  The patient has had 6-7 similar episodes in the past year.  On 12/19/2021, the patient was the passenger in the car when her son noted the patient slumped over in the front seat.  He noted that she was foaming at the mouth but did not see any tonic-clonic activity.  They were about 5 minutes from the emergency department, so he drove her to the emergency department.  Upon arrival, the patient was still somnolent but arousable.  She required assistance to transfer to the gurney.  The patient had another similar episode on 12/12/2021.  They came to the emergency department at that time.  She was diagnosed with a UTI.  She was given IV ceftriaxone and sent home with cephalexin which she has finished.  The patient was admitted in June 2023 with similar episode.  EEG at that time was negative.  She was noted to have right carotid stenosis. In the ED, the patient was afebrile hemodynamically stable.  WBC 8.4, hemoglobin 13.4, platelets 208,000.  Sodium 140, potassium 4.3, bicarbonate 26, serum creatinine 1.27.  CT of the brain was negative.  Chest x-ray suggested right lower lobe opacity.  UA showed 11-20 WBC.  Lactic acid peaked at 2.5>> 1.5. MRI of the brain and EEG were ordered.  Neurology was consulted to assist with management.   Assessment/Plan: Transient episode of unresponsiveness -Concerning for seizure -EEG -MRI brain -Neurology consult  Pulmonary opacity -CT chest -Check procalcitonin <0.10  Pyuria -Continue Unasyn pending culture data  CKD 3b -baseline creatinine 1.2-1.5  Right carotid stenosis -06/21/2021 carotid ultrasound--right ICA 70-99% stenosis -Discussed with the  patient's son again--he does not want any invasive intervention at this time -not clinically contributing to current clinical picture  Hypothyroidism -Continue Synthroid -Check TSH  Major neurocognitive disorder -Continue Aricept  Essential hypertension -Continue Plendil  Mixed hyperlipidemia -Continue Zocor  Lactic acidosis -Sepsis ruled out -Secondary to delayed clearance   Elevated troponin -Secondary to demand ischemia -No chest pain presently -Personally reviewed EKG--sinus rhythm, no ST-T wave change      Family Communication:  son updated 11/30  Consultants:  neurology  Code Status:  DNR  DVT Prophylaxis:  [place] Lovenox   Procedures: As Listed in Progress Note Above  Antibiotics: Unasyn 11/30> Azithro 11/29>      Subjective: Patient denies fevers, chills, headache, chest pain, dyspnea, nausea, vomiting, diarrhea, abdominal pain, dysuria, hematuria, hematochezia, and melena.   Objective: Vitals:   12/19/21 2235 12/20/21 0103 12/20/21 0314 12/20/21 0539  BP: (!) 145/78 (!) 158/51 122/63 110/61  Pulse: 79 (!) 53 72 65  Resp: '19 18 16 18  '$ Temp: 98.1 F (36.7 C) 98 F (36.7 C) 98.3 F (36.8 C) 98.2 F (36.8 C)  TempSrc: Oral Oral Oral   SpO2: 100% 100% 100% 100%  Weight:      Height:        Intake/Output Summary (Last 24 hours) at 12/20/2021 9326 Last data filed at 12/20/2021 0603 Gross per 24 hour  Intake 440.83 ml  Output 200 ml  Net 240.83 ml   Weight change:  Exam:  General:  Pt is alert, follows commands appropriately, not in acute distress HEENT: No icterus, No thrush, No neck mass, [place]/AT  Cardiovascular: RRR, S1/S2, no rubs, no gallops Respiratory: Bibasilar rales, regular left.  No wheezing. Abdomen: Soft/+BS, non tender, non distended, no guarding Extremities: No edema, No lymphangitis, No petechiae, No rashes, no synovitis   Data Reviewed: I have personally reviewed following labs and imaging studies Basic Metabolic  Panel: Recent Labs  Lab 12/19/21 1721 12/19/21 1726 12/19/21 2226 12/20/21 0313  NA 142 140  --  140  K 3.9 3.8  --  4.3  CL 105 105  --  108  CO2  --  24  --  26  GLUCOSE 150* 151*  --  91  BUN 18 19  --  18  CREATININE 1.40* 1.44* 1.31* 1.27*  CALCIUM  --  8.9  --  8.3*  MG  --   --   --  1.9  PHOS  --   --   --  3.6   Liver Function Tests: Recent Labs  Lab 12/19/21 1726  AST 25  ALT 10  ALKPHOS 94  BILITOT 0.5  PROT 7.5  ALBUMIN 3.9   No results for input(s): "LIPASE", "AMYLASE" in the last 168 hours. No results for input(s): "AMMONIA" in the last 168 hours. Coagulation Profile: No results for input(s): "INR", "PROTIME" in the last 168 hours. CBC: Recent Labs  Lab 12/19/21 1721 12/19/21 1726 12/20/21 0313  WBC  --  8.4 6.8  NEUTROABS  --  5.1  --   HGB 15.0 13.4 10.9*  HCT 44.0 42.8 34.5*  MCV  --  99.5 100.3*  PLT  --  208 163   Cardiac Enzymes: No results for input(s): "CKTOTAL", "CKMB", "CKMBINDEX", "TROPONINI" in the last 168 hours. BNP: Invalid input(s): "POCBNP" CBG: No results for input(s): "GLUCAP" in the last 168 hours. HbA1C: No results for input(s): "HGBA1C" in the last 72 hours. Urine analysis:    Component Value Date/Time   COLORURINE YELLOW 12/19/2021 1922   APPEARANCEUR HAZY (A) 12/19/2021 1922   LABSPEC 1.020 12/19/2021 1922   PHURINE 6.0 12/19/2021 1922   GLUCOSEU NEGATIVE 12/19/2021 1922   HGBUR MODERATE (A) 12/19/2021 1922   BILIRUBINUR NEGATIVE 12/19/2021 1922   KETONESUR 5 (A) 12/19/2021 1922   PROTEINUR 100 (A) 12/19/2021 1922   UROBILINOGEN 0.2 04/18/2014 1420   NITRITE NEGATIVE 12/19/2021 1922   LEUKOCYTESUR MODERATE (A) 12/19/2021 1922   Sepsis Labs: '@LABRCNTIP'$ (procalcitonin:4,lacticidven:4) ) Recent Results (from the past 240 hour(s))  Blood culture (routine x 2)     Status: None (Preliminary result)   Collection Time: 12/19/21  5:26 PM   Specimen: Vein; Blood  Result Value Ref Range Status   Specimen  Description BLOOD RIGHT HAND  Final   Special Requests   Final    BOTTLES DRAWN AEROBIC AND ANAEROBIC Blood Culture results may not be optimal due to an inadequate volume of blood received in culture bottles Performed at [facility], [address], [place], [place] [address]    Culture PENDING  Incomplete   Report Status PENDING  Incomplete  Blood culture (routine x 2)     Status: None (Preliminary result)   Collection Time: 12/19/21  5:26 PM   Specimen: Vein; Blood  Result Value Ref Range Status   Specimen Description BLOOD LEFT FOREARM  Final   Special Requests   Final    BOTTLES DRAWN AEROBIC AND ANAEROBIC Blood Culture adequate volume Performed at [facility], [address] [place], [place] [address]    Culture PENDING  Incomplete   Report Status PENDING  Incomplete  Scheduled Meds:  aspirin EC  81 mg Oral Q breakfast   dextromethorphan-guaiFENesin  1 tablet Oral BID   donepezil  5 mg Oral QHS   enoxaparin (LOVENOX) injection  30 mg Subcutaneous Q24H   famotidine  20 mg Oral Daily   felodipine  5 mg Oral Daily   irbesartan  75 mg Oral Daily   levothyroxine  25 mcg Oral Q0600   simvastatin  40 mg Oral Daily   Continuous Infusions:  ampicillin-sulbactam (UNASYN) IV     azithromycin Stopped (12/20/21 0016)    Procedures/Studies: CT Head Wo Contrast  Result Date: 12/19/2021 CLINICAL DATA:  Altered mental status, nontraumatic (Ped 0-17y) EXAM: CT HEAD WITHOUT CONTRAST TECHNIQUE: Contiguous axial images were obtained from the base of the skull through the vertex without intravenous contrast. RADIATION DOSE REDUCTION: This exam was performed according to the departmental dose-optimization program which includes automated exposure control, adjustment of the mA and/or kV according to patient size and/or use of iterative reconstruction technique. COMPARISON:  CT HEAD 06/21/2021 BRAIN: BRAIN Cerebral ventricle sizes are concordant with the degree of cerebral volume loss. Patchy  and confluent areas of decreased attenuation are noted throughout the deep and periventricular white matter of the cerebral hemispheres bilaterally, compatible with chronic microvascular ischemic disease. No evidence of large-territorial acute infarction. No parenchymal hemorrhage. No mass lesion. No extra-axial collection. No mass effect or midline shift. No hydrocephalus. Basilar cisterns are patent. Vascular: No hyperdense vessel. Atherosclerotic calcifications are present within the cavernous internal carotid AND VERTEBRAL arteries. Skull: No acute fracture or focal lesion. Sinuses/Orbits: BILATERAL MAXILLARY MUCOSAL THICKENING. OTHERWISE paranasal sinuses and mastoid air cells are clear. The orbits are unremarkable. Other: None. IMPRESSION: No acute intracranial abnormality. Electronically Signed   By: Iven Finn M.D.   On: 12/19/2021 20:59   DG Chest Port 1 View  Result Date: 12/19/2021 CLINICAL DATA:  Cough, vomiting EXAM: PORTABLE CHEST 1 VIEW COMPARISON:  12/12/2021 FINDINGS: Stable cardiomediastinal contours. Large hiatal hernia. Aortic atherosclerosis. Hyperinflated lungs with coarsened interstitial markings. Small airspace opacity within the inferior aspect of the right upper lobe. No pleural effusion or pneumothorax. IMPRESSION: 1. Small airspace opacity within the inferior aspect of the right upper lobe, suspicious for pneumonia. 2. Large hiatal hernia. Electronically Signed   By: Davina Poke D.O.   On: 12/19/2021 17:36   DG Chest Port 1 View  Result Date: 12/12/2021 CLINICAL DATA:  Near syncopal episode. EXAM: PORTABLE CHEST 1 VIEW COMPARISON:  06/21/2021 FINDINGS: The cardiac silhouette, mediastinal and hilar contours are within normal limits and stable. Stable large hiatal hernia. No acute pulmonary findings. No pleural effusions or pulmonary lesions. No pneumothorax. The bony thorax is intact. IMPRESSION: 1. No acute cardiopulmonary findings. 2. Large hiatal hernia.  Electronically Signed   By: Marijo Sanes M.D.   On: 12/12/2021 16:25    Orson Eva, DO  [facility]  If 7PM-7AM, please contact night-coverage [email or web address] Password [email or web address] 12/20/2021, 8:23 AM   LOS: 1 day

## 2021-12-20 NOTE — Hospital Course (Signed)
88 year old female with a history of cognitive impairment, hypertension, hyperlipidemia, hypothyroidism, B12 and iron deficiency presenting with transient episode of unresponsiveness.  The patient has had 6-7 similar episodes in the past year.  On 12/19/2021, the patient was the passenger in the car when her son noted the patient slumped over in the front seat.  He noted that she was foaming at the mouth but did not see any tonic-clonic activity.  They were about 5 minutes from the emergency department, so he drove her to the emergency department.  Upon arrival, the patient was still somnolent but arousable.  She required assistance to transfer to the gurney.  The patient had another similar episode on 12/12/2021.  They came to the emergency department at that time.  She was diagnosed with a UTI.  She was given IV ceftriaxone and sent home with cephalexin which she has finished.  The patient was admitted in June 2023 with similar episode.  EEG at that time was negative.  She was noted to have right carotid stenosis. In the ED, the patient was afebrile hemodynamically stable.  WBC 8.4, hemoglobin 13.4, platelets 208,000.  Sodium 140, potassium 4.3, bicarbonate 26, serum creatinine 1.27.  CT of the brain was negative.  Chest x-ray suggested right lower lobe opacity.  UA showed 11-20 WBC.  Lactic acid peaked at 2.5>> 1.5. MRI of the brain and EEG were ordered.  Neurology was consulted to assist with management.

## 2021-12-20 NOTE — Progress Notes (Signed)
*  PRELIMINARY RESULTS* Echocardiogram Limited 2-D Echocardiogram  has been performed.  [name] 12/20/2021, 2:38 PM

## 2021-12-20 NOTE — Progress Notes (Signed)
EEG complete - results pending 

## 2021-12-20 NOTE — Consult Note (Addendum)
I connected with  [name] on 12/20/21 by a video enabled telemedicine application and verified that I am speaking with the correct person using two identifiers.   I discussed the limitations of evaluation and management by telemedicine. The patient expressed understanding and agreed to proceed.  Location of [facility]: [facility] Location of physician: [facility]  Neurology Consultation Reason for Consult: ams Referring Physician: Dr Shanon Brow Tat  CC: ams  History is obtained from: patient and son at bedside, chart review  HPI: [name] is a 88 y.o. female  with h/o HTN, HLD, cognitive impairment, pernicious anemia, and carcinoid tumor of stomach who was brought in after an episode of decreased responsiveness.   Per son, patient had lunch and was sitting in the car. When son looked over after some time, she was slumped over and not responding to stimulation. No stiffness or jerking, eyes were closed with drooling, no incontinence. Per son she has had 5-6 such episodes in last year or so, all while sitting in car except one while she was sitting at home, last few minutes followed by return to baseline. He denies any h/o seizures, recent medication changes, staring episodes, recent illness. States patient is compliant with meds on most days. Can perform most ADLs except needs helps with shower.  Per chart review, patient was seen in 2017 for a similar episode. Per that note, she had a similar event a year prior to that as well. Reportedly she has had 30 day even monitor in past without any arrhythmia. She was again seen in June 2023 for similar episode during which per notes she had bowel and bladder incontinence but son doesn't remember this detail. She was seen for similar episode on 12/12/2021 and was diagnosed with UTI, currently on cephalexin  Prior routine eeg on 06/22/2021 was within normal limits.  ROS: All other systems reviewed and negative except as noted in the HPI.   Past  Medical History:  Diagnosis Date   Adult BMI 19-24 kg/sq m 2008 125 lbs   Anemia    Pernicious 2008-HB 12 MCV 119.5; JAN 2012 HB 13.2 MCV 97.6   Carcinoid tumor of stomach 2008   Diverticulosis    GERD (gastroesophageal reflux disease)    Hiatal hernia    HTN (hypertension)    Hyperlipidemia    Hypertension    Phreesia 02/07/2020   Mild memory loss 02/15/2011   Near syncope 04/18/2014   Pernicious anemia    Pernicious anemia 06/14/2010   Wrist fracture, left 2008    Family History  Problem Relation Age of Onset   Cancer Mother    Heart disease Brother    Kidney disease Son    Colon cancer Neg Hx    Colon polyps Neg Hx     Social History:  reports that she has never smoked. She has never used smokeless tobacco. She reports that she does not drink alcohol and does not use drugs.   Medications Prior to Admission  Medication Sig Dispense Refill Last Dose   acetaminophen (TYLENOL) 500 MG tablet Take 500 mg by mouth every 6 (six) hours as needed. For pain   unknown   aspirin EC 81 MG tablet Take 1 tablet (81 mg total) by mouth daily with breakfast. 30 tablet 2 unknown   [EXPIRED] cephALEXin (KEFLEX) 500 MG capsule Take 1 capsule (500 mg total) by mouth 3 (three) times daily for 7 days. 21 capsule 0 12/19/2021   donepezil (ARICEPT) 5 MG tablet TAKE  1 TABLET BY MOUTH AT BEDTIME 30 tablet 4 12/18/2021   famotidine (PEPCID) 20 MG tablet Take 20 mg by mouth daily.   12/19/2021   felodipine (PLENDIL) 5 MG 24 hr tablet Take 1 tablet (5 mg total) by mouth daily. 90 tablet 1 12/19/2021   levothyroxine (SYNTHROID) 25 MCG tablet TAKE 1 TABLET BY MOUTH ONCE DAILY BEFORE BREAKFAST 90 tablet 0 12/19/2021   simvastatin (ZOCOR) 40 MG tablet Take 1 tablet (40 mg total) by mouth daily. 90 tablet 3 12/19/2021   valsartan (DIOVAN) 40 MG tablet Take 1 tablet (40 mg total) by mouth daily. 90 tablet 1 12/19/2021      Exam: Current vital signs: BP 110/61 (BP Location: Left Arm)   Pulse 65   Temp  98.2 F (36.8 C)   Resp 18   Ht '5\' 1"'$  (1.549 m)   Wt 44.9 kg   SpO2 100%   BMI 18.70 kg/m  Vital signs in last 24 hours: Temp:  [96.3 F (35.7 C)-98.3 F (36.8 C)] 98.2 F (36.8 C) (11/30 0539) Pulse Rate:  [53-89] 65 (11/30 0539) Resp:  [15-26] 18 (11/30 0539) BP: (110-164)/(51-91) 110/61 (11/30 0539) SpO2:  [100 %] 100 % (11/30 0539) Weight:  [44.9 kg] 44.9 kg (11/29 1721)   Physical Exam  Constitutional: Appears well-developed and well-nourished.  Psych: Affect appropriate to situation Eyes: No scleral injection Neuro: awake, alert, oriented to place and person but not to time ( baseline), no aphasia, CN 2-12 grossly intact, antigravity strength in all extremities without drift, FTN intact BL   I have reviewed labs in epic and the results pertinent to this consultation are: CBC:  Recent Labs  Lab 12/19/21 1726 12/20/21 0313  WBC 8.4 6.8  NEUTROABS 5.1  --   HGB 13.4 10.9*  HCT 42.8 34.5*  MCV 99.5 100.3*  PLT 208 497    Basic Metabolic Panel:  Lab Results  Component Value Date   NA 140 12/20/2021   K 4.3 12/20/2021   CO2 26 12/20/2021   GLUCOSE 91 12/20/2021   BUN 18 12/20/2021   CREATININE 1.27 (H) 12/20/2021   CALCIUM 8.3 (L) 12/20/2021   GFRNONAA 38 (L) 12/20/2021   GFRAA 42 (L) 09/25/2019   Lipid Panel: No results found for: "LDLCALC" HgbA1c:  Lab Results  Component Value Date   HGBA1C 5.1 10/25/2021   Urine Drug Screen:     Component Value Date/Time   LABOPIA NONE DETECTED 06/21/2021 1357   COCAINSCRNUR NONE DETECTED 06/21/2021 1357   LABBENZ NONE DETECTED 06/21/2021 1357   AMPHETMU NONE DETECTED 06/21/2021 1357   THCU NONE DETECTED 06/21/2021 1357   LABBARB NONE DETECTED 06/21/2021 1357    Alcohol Level     Component Value Date/Time   ETH <10 06/21/2021 1319     I have reviewed the images obtained: MR brain wo contrast and MRA wo contrast 12/20/2021:  1. Unremarkable for age brain MRI with no acute intracranial pathology. 2.  Moderate to severe stenosis of the bilateral supraclinoid ICAs, moderate to severe stenosis of the right M1 segment, and moderate multifocal irregularity and narrowing of the right PCA.  3. Mucosal thickening in the maxillary sinuses with layering fluid which could reflect acute sinusitis in the correct clinical setting. Small bilateral mastoid effusions.  ASSESSMENT/PLAN: 88yo F with 5-6 episodes of transient alteration of awareness over 5-6 years, but most recently in June 2023 and again now.   Transient alteration of awareness Carotid stenosis - ddx include cerebral hypoperfusion vs cardiac arrythmia  vs seizure   Recommendations: - except with the episode in June where patient reportedly had B/B incontinence, patient has not had any other features concerning for seizure. Dementia is her only risk factor for seizure. Therefore, I am hesitant to start AED unless we have evidence of epileptogenicity on EEG or episodes persist after other workup is negative -Recommend ASA '81mg'$  daily and plavix '75mg'$  daily for 3 months - Will discuss if she will be a candidate for intervention for significant carotid stenosis with Dr Kerrie Buffalo -Recommend checking blood pressure immediately after the event.  Recommend keeping blood pressure diary and stay hydrated. -Also discussed with Dr. Erlinda Hong, recommend systolic BP goal [phone] - Recommend 30 day event monitor to look for arrhythmia - Routine EEG to assess for epileptogenicity - F/u with [facility] in 2month - Discussed plan with Dr tat and son at bedside  ADDENDUM -Dr. TCarles Colletnotified me that echo showed highly mobile oval-shaped echodensity on the anterior/anterolateral wall protruding into left ventricular cavity.  [facility]  [facility] Thank you for allowing [facility] participate in the care of this patient. If you have any further questions, please  contact  me or neurohospitalist.   PZeb ComfortEpilepsy [facility]

## 2021-12-20 NOTE — Progress Notes (Addendum)
I discussed case and finding with neurology, Dr. Hortense Ramal and son.  MRI brain-no acute finding MRA brain-mod to severe stenosis of bilat supraclinoid ICAs; mod to severe stenosis of Right M1 Limited Echo--1.15 x 0.9 echodensity attached to anterolateral wall>>thrombus vs mass vs vegtation -----cardiology recommended cardiac MRI to clarify echodensity EEG--results pending Carotid US>>50-69% stenosis on right; <50% stenosis on left  Dr. Hortense Ramal discussed with neuro IR, Dr. Norma Fredrickson who felt would be a candidate for stenting R-ICA Other recommendations>>start plavix and goal BP systolic [phone]. Recommend laying down flat if episode recurs and checking BP. Also keeps BP diary, stay hydrated   Discussed with son, and he is agreeable to the transfer for further work up.  Dr. Hortense Ramal will follow patient after transfer.  Please request IR consult after transfer.  Please also order cardiac MRI after transfer  DTat

## 2021-12-20 NOTE — Progress Notes (Signed)
Patient pulled out her IV and it was on the meal tray when I entered the room to do the patient's shift assessment. Patient does not appear to be in any distress at this time. I explained the the patient that I would have to restart her IV access so that she can receive her antibiotics at 2200. Patient's son at the bedside.

## 2021-12-20 NOTE — Progress Notes (Signed)
  Transition of Care [facility]) Screening Note   Patient Details  Name: [name] Date of Birth: July 22, 1922   Transition of Care [facility]) CM/SW Contact:    Ihor Gully, LCSW Phone Number: 12/20/2021, 11:28 AM    Transition of Care Department [facility]) has reviewed patient and no TOC needs have been identified at this time. We will continue to monitor patient advancement through interdisciplinary progression rounds. If new patient transition needs arise, please place a TOC consult.

## 2021-12-21 ENCOUNTER — Inpatient Hospital Stay: Payer: PPO

## 2021-12-21 DIAGNOSIS — R55 Syncope and collapse: Secondary | ICD-10-CM | POA: Diagnosis not present

## 2021-12-21 DIAGNOSIS — R4182 Altered mental status, unspecified: Secondary | ICD-10-CM

## 2021-12-21 DIAGNOSIS — R931 Abnormal findings on diagnostic imaging of heart and coronary circulation: Secondary | ICD-10-CM

## 2021-12-21 DIAGNOSIS — I10 Essential (primary) hypertension: Secondary | ICD-10-CM | POA: Diagnosis not present

## 2021-12-21 DIAGNOSIS — I779 Disorder of arteries and arterioles, unspecified: Secondary | ICD-10-CM | POA: Diagnosis not present

## 2021-12-21 DIAGNOSIS — N1832 Chronic kidney disease, stage 3b: Secondary | ICD-10-CM | POA: Diagnosis not present

## 2021-12-21 LAB — CBC
HCT: 34.4 % — ABNORMAL LOW (ref 36.0–46.0)
Hemoglobin: 10.5 g/dL — ABNORMAL LOW (ref 12.0–15.0)
MCH: 30.9 pg (ref 26.0–34.0)
MCHC: 30.5 g/dL (ref 30.0–36.0)
MCV: 101.2 fL — ABNORMAL HIGH (ref 80.0–100.0)
Platelets: 147 10*3/uL — ABNORMAL LOW (ref 150–400)
RBC: 3.4 MIL/uL — ABNORMAL LOW (ref 3.87–5.11)
RDW: 12.4 % (ref 11.5–15.5)
WBC: 4.7 10*3/uL (ref 4.0–10.5)
nRBC: 0 % (ref 0.0–0.2)

## 2021-12-21 LAB — BASIC METABOLIC PANEL
Anion gap: 7 (ref 5–15)
BUN: 15 mg/dL (ref 8–23)
CO2: 25 mmol/L (ref 22–32)
Calcium: 8.4 mg/dL — ABNORMAL LOW (ref 8.9–10.3)
Chloride: 109 mmol/L (ref 98–111)
Creatinine, Ser: 1.17 mg/dL — ABNORMAL HIGH (ref 0.44–1.00)
GFR, Estimated: 42 mL/min — ABNORMAL LOW (ref >60)
Glucose, Bld: 86 mg/dL (ref 70–99)
Potassium: 4.2 mmol/L (ref 3.5–5.1)
Sodium: 141 mmol/L (ref 135–145)

## 2021-12-21 LAB — LEGIONELLA PNEUMOPHILA SEROGP 1 UR AG: L. pneumophila Serogp 1 Ur Ag: NEGATIVE

## 2021-12-21 LAB — MAGNESIUM: Magnesium: 1.8 mg/dL (ref 1.7–2.4)

## 2021-12-21 MED ORDER — CEFDINIR 300 MG PO CAPS: 300.0000 mg | ORAL_CAPSULE | Freq: Two times a day (BID) | ORAL | Status: DC

## 2021-12-21 MED ORDER — CEFDINIR 300 MG PO CAPS
300.0000 mg | ORAL_CAPSULE | Freq: Every day | ORAL | Status: DC
  Administered 2021-12-21 – 2021-12-22 (×2): 300 mg via ORAL
  Filled 2021-12-21 (×2): qty 1

## 2021-12-21 NOTE — Progress Notes (Signed)
PROGRESS NOTE  [name] DJT:[id number] DOB: March 10, 1922 DOA: 12/19/2021 PCP: [name], MD  Brief History:  88 year old female with a history of cognitive impairment, hypertension, hyperlipidemia, hypothyroidism, B12 and iron deficiency presenting with transient episode of unresponsiveness.  The patient has had 6-7 similar episodes in the past year.  On 12/19/2021, the patient was the passenger in the car when her son noted the patient slumped over in the front seat.  He noted that she was foaming at the mouth but did not see any tonic-clonic activity.  They were about 5 minutes from the emergency department, so he drove her to the emergency department.  Upon arrival, the patient was still somnolent but arousable.  She required assistance to transfer to the gurney.  The patient had another similar episode on 12/12/2021.  They came to the emergency department at that time.  She was diagnosed with a UTI.  She was given IV ceftriaxone and sent home with cephalexin which she has finished.  The patient was admitted in June 2023 with similar episode.  EEG at that time was negative.  She was noted to have right carotid stenosis. In the ED, the patient was afebrile hemodynamically stable.  WBC 8.4, hemoglobin 13.4, platelets 208,000.  Sodium 140, potassium 4.3, bicarbonate 26, serum creatinine 1.27.  CT of the brain was negative.  Chest x-ray suggested right lower lobe opacity.  UA showed 11-20 WBC.  Lactic acid peaked at 2.5>> 1.5. MRI of the brain and EEG were ordered.  Neurology was consulted to assist with management.   Assessment/Plan: Transient episode of unresponsiveness -Concerning for seizure vs cardiogenic -EEG-results pending -MRI brain--neg for acute finding -MRA brain-mod to severe stenosis of bilat supraclinoid ICAs; mod to severe stenosis of Right M1  -Neurology consulted>>discussed with Dr. Hortense Ramal -Dr. Hortense Ramal discussed with neuro IR, Dr. Norma Fredrickson who felt would be a  candidate for stenting R-ICA Other recommendations>>start plavix and goal BP systolic 300-923. Recommend laying down flat if episode recurs and checking BP. Also keep BP diary, stay hydrated -transfer to [facility] for consideration of carotid stent -no concerning dysrhythmia on tele presently  Echo abnormality -11/30 Echo--mobile oval shaped echodensity 1.15 x 0.9 cm attached to anterolateral wall--thrombus vs vegetation vs mass -cardiac MRI recommended by cardiology -no cardiac MR available at [facility]>>transfer to [facility]  Pulmonary opacity -CT chest--large sliding hiatal hernia with RLL atelectasis mostly due to mass effect from hernia.  Stable pulmonary nodules--no further f/u required -Check procalcitonin <0.10 -d/c IV antibiotics   Pyuria -Initially started Unasyn pending culture data -switch to cefdinir pending culture data   CKD 3b -baseline creatinine 1.2-1.5   Right carotid stenosis -06/21/2021 carotid ultrasound--right ICA 70-99% stenosis -Discussed with the patient's son again--he does not want any invasive intervention at this time -not clinically contributing to current clinical picture   Hypothyroidism -Continue Synthroid -10/25/21 TSH 2.880   Minor neurocognitive disorder -Continue Aricept   Essential hypertension -Continue Plendil   Mixed hyperlipidemia -Continue Zocor   Lactic acidosis -Sepsis ruled out -Secondary to delayed clearance  -remains afebrile and hemodynamically stable   Elevated troponin -Secondary to demand ischemia -No chest pain presently -Personally reviewed EKG--sinus rhythm, no ST-T wave change           Family Communication:  son updated 12/21/21--agrees with transfer to [facility]   Consultants:  neurology   Code Status:  DNR   DVT Prophylaxis:  [place] Lovenox  Procedures: As Listed in Progress Note Above   Antibiotics: Unasyn 11/30>12/1 Azithro 11/29>12/1        Subjective: Patient denies fevers, chills, headache,  chest pain, dyspnea, nausea, vomiting, diarrhea, abdominal pain, dysuria, hematuria, hematochezia, and melena.   Objective: Vitals:   12/20/21 0539 12/20/21 1430 12/20/21 2055 12/21/21 0603  BP: 110/61 (!) 147/67 (!) 152/74 (!) 141/74  Pulse: 65 78 (!) 59 67  Resp: '18 18 16 17  '$ Temp: 98.2 F (36.8 C) 98.5 F (36.9 C) 98.4 F (36.9 C) 98.3 F (36.8 C)  TempSrc:    Oral  SpO2: 100% 100% 100% 100%  Weight:      Height:        Intake/Output Summary (Last 24 hours) at 12/21/2021 0737 Last data filed at 12/21/2021 0505 Gross per 24 hour  Intake 1070 ml  Output --  Net 1070 ml   Weight change:  Exam:  General:  Pt is alert, follows commands appropriately, not in acute distress HEENT: No icterus, No thrush, No neck mass, [place]/AT Cardiovascular: RRR, S1/S2, no rubs, no gallops Respiratory: CTA bilaterally, no wheezing, no crackles, no rhonchi Abdomen: Soft/+BS, non tender, non distended, no guarding Extremities: No edema, No lymphangitis, No petechiae, No rashes, no synovitis   Data Reviewed: I have personally reviewed following labs and imaging studies Basic Metabolic Panel: Recent Labs  Lab 12/19/21 1721 12/19/21 1726 12/19/21 2226 12/20/21 0313 12/21/21 0349  NA 142 140  --  140 141  K 3.9 3.8  --  4.3 4.2  CL 105 105  --  108 109  CO2  --  24  --  26 25  GLUCOSE 150* 151*  --  91 86  BUN 18 19  --  18 15  CREATININE 1.40* 1.44* 1.31* 1.27* 1.17*  CALCIUM  --  8.9  --  8.3* 8.4*  MG  --   --   --  1.9 1.8  PHOS  --   --   --  3.6  --    Liver Function Tests: Recent Labs  Lab 12/19/21 1726  AST 25  ALT 10  ALKPHOS 94  BILITOT 0.5  PROT 7.5  ALBUMIN 3.9   No results for input(s): "LIPASE", "AMYLASE" in the last 168 hours. No results for input(s): "AMMONIA" in the last 168 hours. Coagulation Profile: No results for input(s): "INR", "PROTIME" in the last 168 hours. CBC: Recent Labs  Lab 12/19/21 1721 12/19/21 1726 12/20/21 0313 12/21/21 0349  WBC  --   8.4 6.8 4.7  NEUTROABS  --  5.1  --   --   HGB 15.0 13.4 10.9* 10.5*  HCT 44.0 42.8 34.5* 34.4*  MCV  --  99.5 100.3* 101.2*  PLT  --  208 163 147*   Cardiac Enzymes: No results for input(s): "CKTOTAL", "CKMB", "CKMBINDEX", "TROPONINI" in the last 168 hours. BNP: Invalid input(s): "POCBNP" CBG: No results for input(s): "GLUCAP" in the last 168 hours. HbA1C: No results for input(s): "HGBA1C" in the last 72 hours. Urine analysis:    Component Value Date/Time   COLORURINE YELLOW 12/19/2021 1922   APPEARANCEUR HAZY (A) 12/19/2021 1922   LABSPEC 1.020 12/19/2021 1922   PHURINE 6.0 12/19/2021 1922   GLUCOSEU NEGATIVE 12/19/2021 1922   HGBUR MODERATE (A) 12/19/2021 1922   BILIRUBINUR NEGATIVE 12/19/2021 1922   KETONESUR 5 (A) 12/19/2021 1922   PROTEINUR 100 (A) 12/19/2021 1922   UROBILINOGEN 0.2 04/18/2014 1420   NITRITE NEGATIVE 12/19/2021 1922   LEUKOCYTESUR MODERATE (A) 12/19/2021  1922   Sepsis Labs: '@LABRCNTIP'$ (procalcitonin:4,lacticidven:4) ) Recent Results (from the past 240 hour(s))  Blood culture (routine x 2)     Status: None (Preliminary result)   Collection Time: 12/19/21  5:26 PM   Specimen: BLOOD RIGHT HAND  Result Value Ref Range Status   Specimen Description BLOOD RIGHT HAND  Final   Special Requests   Final    BOTTLES DRAWN AEROBIC AND ANAEROBIC Blood Culture results may not be optimal due to an inadequate volume of blood received in culture bottles   Culture   Final    NO GROWTH < 24 HOURS Performed at [facility], [address] [place], [place] [address]    Report Status PENDING  Incomplete  Blood culture (routine x 2)     Status: None (Preliminary result)   Collection Time: 12/19/21  5:26 PM   Specimen: BLOOD LEFT FOREARM  Result Value Ref Range Status   Specimen Description BLOOD LEFT FOREARM  Final   Special Requests   Final    BOTTLES DRAWN AEROBIC AND ANAEROBIC Blood Culture adequate volume   Culture   Final    NO GROWTH < 24 HOURS Performed  at [facility], [address] [place], [place] [address]    Report Status PENDING  Incomplete     Scheduled Meds:  aspirin EC  81 mg Oral Q breakfast   clopidogrel  75 mg Oral Daily   dextromethorphan-guaiFENesin  1 tablet Oral BID   donepezil  5 mg Oral QHS   enoxaparin (LOVENOX) injection  30 mg Subcutaneous Q24H   famotidine  20 mg Oral Daily   felodipine  5 mg Oral Daily   irbesartan  75 mg Oral Daily   levothyroxine  25 mcg Oral Q0600   simvastatin  40 mg Oral Daily   Continuous Infusions:  ampicillin-sulbactam (UNASYN) IV Stopped (12/20/21 2215)   azithromycin Stopped (12/20/21 2329)    Procedures/Studies: ECHOCARDIOGRAM LIMITED  Result Date: 12/20/2021    ECHOCARDIOGRAM LIMITED REPORT   Patient Name:   AVIAH SORCI Date of Exam: 12/20/2021 Medical Rec #:  [id number]      Height:       61.0 in Accession #:    [id number]     Weight:       99.0 lb Date of Birth:  09-27-1922      BSA:          1.401 m Patient Age:    51 years       BP:           110/61 mmHg Patient Gender: F              HR:           65 bpm. Exam Location:  [facility] Procedure: Limited Echo and Cardiac Doppler Indications:    R55 Syncope  History:        Patient has prior history of Echocardiogram examinations, most                 recent 06/22/2021. Risk Factors:Hypertension and Dyslipidemia.  Sonographer:    Alvino Chapel RCS Referring Phys: [id number] OLADAPO ADEFESO IMPRESSIONS  1. Limited Echo to evaluate for syncope  2. There is a highly mobile oval shaped echodensity measuring 1.15 x 0.9 cm that appears to be attached to the mid anterior/anterolateral wall and protruding into the left ventricular cavity. Differential diagnoses include thrombus, tumor/metastases, vegetation. Clinical correlation is recommended. Consider obtaining cardiac MRI for further evaluation of the echodensity. Left  ventricular ejection fraction, by estimation, is >75%. The left ventricle has hyperdynamic function. There is mild left  ventricular hypertrophy. Left ventricular diastolic function could not be evaluated.  3. Right ventricular systolic function was not well visualized. The right ventricular size is normal. FINDINGS  Left Ventricle: There is a highly mobile oval shaped echodensity measuring 1.15 x 0.9 cm that appears to be attached to the mid anterior/anterolateral wall and protruding into the left ventricular cavity. Differential diagnoses include thrombus, tumor/metastases, vegetation. Clinical correlation is recommended. Consider obtaining cardiac MRI for further evaluation of the echodensity. Left ventricular ejection fraction, by estimation, is >75%. The left ventricle has hyperdynamic function. The left ventricular internal cavity size was small. There is mild left ventricular hypertrophy. Left ventricular diastolic function could not be evaluated. Right Ventricle: The right ventricular size is normal. Right vetricular wall thickness was not well visualized. Right ventricular systolic function was not well visualized. Left Atrium: Left atrial size was normal in size. Right Atrium: Right atrial size was normal in size. Pericardium: Trivial pericardial effusion is present. Mitral Valve: The mitral valve is degenerative in appearance. Tricuspid Valve: The tricuspid valve is grossly normal. Aortic Valve: The aortic valve is tricuspid. Aortic valve mean gradient measures 4.0 mmHg. Aortic valve peak gradient measures 8.6 mmHg. Aortic valve area, by VTI measures 1.76 cm. Pulmonic Valve: The pulmonic valve was grossly normal. Aorta: The aortic root is normal in size and structure. LEFT VENTRICLE PLAX 2D LVIDd:         3.50 cm LVIDs:         2.20 cm LV PW:         1.20 cm LV IVS:        1.30 cm LVOT diam:     1.80 cm LV SV:         55 LV SV Index:   39 LVOT Area:     2.54 cm  LEFT ATRIUM         Index LA diam:    2.95 cm 2.11 cm/m  AORTIC VALVE AV Area (Vmax):    1.75 cm AV Area (Vmean):   1.90 cm AV Area (VTI):     1.76 cm AV  Vmax:           147.00 cm/s AV Vmean:          93.600 cm/s AV VTI:            0.312 m AV Peak Grad:      8.6 mmHg AV Mean Grad:      4.0 mmHg LVOT Vmax:         101.00 cm/s LVOT Vmean:        70.000 cm/s LVOT VTI:          0.216 m LVOT/AV VTI ratio: 0.69  AORTA Ao Root diam: 2.80 cm  SHUNTS Systemic VTI:  0.22 m Systemic Diam: 1.80 cm Vishnu Priya Mallipeddi Electronically signed by Lorelee Cover Mallipeddi Signature Date/Time: 12/20/2021/3:41:31 PM    Final    US Carotid Bilateral  Result Date: 12/20/2021 CLINICAL DATA:  Transient neurologic symptoms Episodes of unresponsiveness Hypertension Syncope Hyperlipidemia EXAM: BILATERAL CAROTID DUPLEX ULTRASOUND TECHNIQUE: Pearline Cables scale imaging, color Doppler and duplex ultrasound were performed of bilateral carotid and vertebral arteries in the neck. COMPARISON:  06/21/2021 FINDINGS: Criteria: Quantification of carotid stenosis is based on velocity parameters that correlate the residual internal carotid diameter with NASCET-based stenosis levels, using the diameter of the distal internal carotid lumen as the denominator for stenosis  measurement. The following velocity measurements were obtained: RIGHT ICA: 127/11 cm/sec CCA: 10/6 cm/sec SYSTOLIC ICA/CCA RATIO:  1.6 ECA: 371 cm/sec LEFT ICA: 57/13 cm/sec CCA: 26/94 cm/sec SYSTOLIC ICA/CCA RATIO:  0.6 ECA: 114 cm/sec RIGHT CAROTID ARTERY: Bulky heterogeneous calcified plaque again seen extending from the distal right common carotid into the proximal internal and external carotid arteries. There has been significant interval decrease in peak systolic velocity which now measures approximately 120 centimeters/second compared to 246 centimeters/second on prior examination. The external carotid artery velocity remains elevated at 371 centimeters/second. RIGHT VERTEBRAL ARTERY:  Antegrade flow. LEFT CAROTID ARTERY: Moderate calcified plaque noted at the carotid bifurcation extending into the proximal internal carotid artery.  LEFT VERTEBRAL ARTERY:  Antegrade flow. IMPRESSION: 1. 50-69% stenosis of the right internal carotid artery. Interval decrease of peak systolic velocity. 2. Less than 50% stenosis of the left internal carotid artery. Electronically Signed   By: Miachel Roux M.D.   On: 12/20/2021 15:09   MR BRAIN WO CONTRAST  Result Date: 12/20/2021 CLINICAL DATA:  Altered mental status, seizure EXAM: MRI HEAD WITHOUT CONTRAST MRA HEAD WITHOUT CONTRAST TECHNIQUE: Multiplanar, multi-echo pulse sequences of the brain and surrounding structures were acquired without intravenous contrast. Angiographic images of the Circle of Willis were acquired using MRA technique without intravenous contrast. COMPARISON:  CT head 1 day prior, brain MRI 09/24/2019 FINDINGS: MRI HEAD FINDINGS Brain: There is no acute intracranial hemorrhage, extra-axial fluid collection, or acute infarct There is background parenchymal volume loss with prominence of the ventricular system and extra-axial CSF spaces. The ventricles are similar in size compared to the prior brain MRI from 2021. Patchy FLAIR signal abnormality in the supratentorial white matter is nonspecific but likely reflects sequela of underlying chronic small-vessel ischemic change, mild for age. There is no suspicious parenchymal signal abnormality or abnormal mass lesion. There is no mass effect or midline shift. Vascular: See below. Skull and upper cervical spine: Normal marrow signal. Sinuses/Orbits: The paranasal sinuses are clear. There is mucosal thickening in the maxillary sinuses with layering fluid. Other: There are small bilateral mastoid effusions. MRA HEAD FINDINGS Anterior circulation: The intracranial ICAs are patent with atherosclerotic irregularity and narrowing resulting in moderate to severe stenosis of the supraclinoid ICAs. There is moderate to severe stenosis of the right M1 segment (16-83). The MCAs are otherwise patent, without other proximal stenosis or occlusion. The ACAs  are patent, without proximal high-grade stenosis or occlusion. There is no aneurysm or AVM. Posterior circulation: The bilateral V4 segments are patent. The basilar artery is patent. The major cerebellar arteries appear patent. There is moderate multifocal irregularity and narrowing of the right PCA with overall diminished enhancement of the P2 segment. The left PCA is patent without proximal high-grade stenosis or occlusion. There is no aneurysm or AVM. Anatomic variants: None. IMPRESSION: 1. Unremarkable for age brain MRI with no acute intracranial pathology. 2. Moderate to severe stenosis of the bilateral supraclinoid ICAs, moderate to severe stenosis of the right M1 segment, and moderate multifocal irregularity and narrowing of the right PCA. 3. Mucosal thickening in the maxillary sinuses with layering fluid which could reflect acute sinusitis in the correct clinical setting. Small bilateral mastoid effusions. Electronically Signed   By: Valetta Mole M.D.   On: 12/20/2021 10:10   MR ANGIO HEAD WO CONTRAST  Result Date: 12/20/2021 CLINICAL DATA:  Altered mental status, seizure EXAM: MRI HEAD WITHOUT CONTRAST MRA HEAD WITHOUT CONTRAST TECHNIQUE: Multiplanar, multi-echo pulse sequences of the brain and surrounding structures were acquired  without intravenous contrast. Angiographic images of the Circle of Willis were acquired using MRA technique without intravenous contrast. COMPARISON:  CT head 1 day prior, brain MRI 09/24/2019 FINDINGS: MRI HEAD FINDINGS Brain: There is no acute intracranial hemorrhage, extra-axial fluid collection, or acute infarct There is background parenchymal volume loss with prominence of the ventricular system and extra-axial CSF spaces. The ventricles are similar in size compared to the prior brain MRI from 2021. Patchy FLAIR signal abnormality in the supratentorial white matter is nonspecific but likely reflects sequela of underlying chronic small-vessel ischemic change, mild for  age. There is no suspicious parenchymal signal abnormality or abnormal mass lesion. There is no mass effect or midline shift. Vascular: See below. Skull and upper cervical spine: Normal marrow signal. Sinuses/Orbits: The paranasal sinuses are clear. There is mucosal thickening in the maxillary sinuses with layering fluid. Other: There are small bilateral mastoid effusions. MRA HEAD FINDINGS Anterior circulation: The intracranial ICAs are patent with atherosclerotic irregularity and narrowing resulting in moderate to severe stenosis of the supraclinoid ICAs. There is moderate to severe stenosis of the right M1 segment (16-83). The MCAs are otherwise patent, without other proximal stenosis or occlusion. The ACAs are patent, without proximal high-grade stenosis or occlusion. There is no aneurysm or AVM. Posterior circulation: The bilateral V4 segments are patent. The basilar artery is patent. The major cerebellar arteries appear patent. There is moderate multifocal irregularity and narrowing of the right PCA with overall diminished enhancement of the P2 segment. The left PCA is patent without proximal high-grade stenosis or occlusion. There is no aneurysm or AVM. Anatomic variants: None. IMPRESSION: 1. Unremarkable for age brain MRI with no acute intracranial pathology. 2. Moderate to severe stenosis of the bilateral supraclinoid ICAs, moderate to severe stenosis of the right M1 segment, and moderate multifocal irregularity and narrowing of the right PCA. 3. Mucosal thickening in the maxillary sinuses with layering fluid which could reflect acute sinusitis in the correct clinical setting. Small bilateral mastoid effusions. Electronically Signed   By: Valetta Mole M.D.   On: 12/20/2021 10:10   CT CHEST WO CONTRAST  Result Date: 12/20/2021 CLINICAL DATA:  Respiratory illness. EXAM: CT CHEST WITHOUT CONTRAST TECHNIQUE: Multidetector CT imaging of the chest was performed following the standard protocol without IV  contrast. RADIATION DOSE REDUCTION: This exam was performed according to the departmental dose-optimization program which includes automated exposure control, adjustment of the mA and/or kV according to patient size and/or use of iterative reconstruction technique. COMPARISON:  December 19, 2021.  February 08, 2020. FINDINGS: Cardiovascular: Atherosclerosis of thoracic aorta is noted without aneurysm formation. Mild cardiomegaly. No pericardial effusion. Mediastinum/Nodes: Large sliding-type hiatal hernia is noted. Thyroid gland is not well visualized. No significant adenopathy is noted. Lungs/Pleura: No pneumothorax or pleural effusion is noted. Mild right lower lobe subsegmental atelectasis is noted. Stable cluster of nodules is seen in lingular segment of left upper lobe with the largest measuring 5 mm. 4 mm nodule is noted anteriorly in right upper lobe best seen on image number 54 series 4. This is not significantly changed compared to prior exam either. Upper Abdomen: Probable cholelithiasis. Calcified splenic granulomas are noted. Musculoskeletal: No chest wall mass or suspicious bone lesions identified. IMPRESSION: Large sliding-type hiatal hernia is noted. Mild right lower lobe subsegmental atelectasis is noted most likely due to mass effect from this hernia. Stable bilateral pulmonary nodules are noted compared with the prior exam. These can be considered benign at this point with no further follow-up required. Cholelithiasis.  Aortic Atherosclerosis (ICD10-I70.0). Electronically Signed   By: Marijo Conception M.D.   On: 12/20/2021 09:52   CT Head Wo Contrast  Result Date: 12/19/2021 CLINICAL DATA:  Altered mental status, nontraumatic (Ped 0-17y) EXAM: CT HEAD WITHOUT CONTRAST TECHNIQUE: Contiguous axial images were obtained from the base of the skull through the vertex without intravenous contrast. RADIATION DOSE REDUCTION: This exam was performed according to the departmental dose-optimization program  which includes automated exposure control, adjustment of the mA and/or kV according to patient size and/or use of iterative reconstruction technique. COMPARISON:  CT HEAD 06/21/2021 BRAIN: BRAIN Cerebral ventricle sizes are concordant with the degree of cerebral volume loss. Patchy and confluent areas of decreased attenuation are noted throughout the deep and periventricular white matter of the cerebral hemispheres bilaterally, compatible with chronic microvascular ischemic disease. No evidence of large-territorial acute infarction. No parenchymal hemorrhage. No mass lesion. No extra-axial collection. No mass effect or midline shift. No hydrocephalus. Basilar cisterns are patent. Vascular: No hyperdense vessel. Atherosclerotic calcifications are present within the cavernous internal carotid AND VERTEBRAL arteries. Skull: No acute fracture or focal lesion. Sinuses/Orbits: BILATERAL MAXILLARY MUCOSAL THICKENING. OTHERWISE paranasal sinuses and mastoid air cells are clear. The orbits are unremarkable. Other: None. IMPRESSION: No acute intracranial abnormality. Electronically Signed   By: Iven Finn M.D.   On: 12/19/2021 20:59   DG Chest Port 1 View  Result Date: 12/19/2021 CLINICAL DATA:  Cough, vomiting EXAM: PORTABLE CHEST 1 VIEW COMPARISON:  12/12/2021 FINDINGS: Stable cardiomediastinal contours. Large hiatal hernia. Aortic atherosclerosis. Hyperinflated lungs with coarsened interstitial markings. Small airspace opacity within the inferior aspect of the right upper lobe. No pleural effusion or pneumothorax. IMPRESSION: 1. Small airspace opacity within the inferior aspect of the right upper lobe, suspicious for pneumonia. 2. Large hiatal hernia. Electronically Signed   By: Davina Poke D.O.   On: 12/19/2021 17:36   DG Chest Port 1 View  Result Date: 12/12/2021 CLINICAL DATA:  Near syncopal episode. EXAM: PORTABLE CHEST 1 VIEW COMPARISON:  06/21/2021 FINDINGS: The cardiac silhouette, mediastinal and  hilar contours are within normal limits and stable. Stable large hiatal hernia. No acute pulmonary findings. No pleural effusions or pulmonary lesions. No pneumothorax. The bony thorax is intact. IMPRESSION: 1. No acute cardiopulmonary findings. 2. Large hiatal hernia. Electronically Signed   By: Marijo Sanes M.D.   On: 12/12/2021 16:25    Orson Eva, DO  [facility]  If 7PM-7AM, please contact night-coverage [email or web address] Password [email or web address] 12/21/2021, 7:37 AM   LOS: 2 days

## 2021-12-21 NOTE — Care Management Important Message (Signed)
Important Message  Patient Details  Name: [name] MRN: [id number] Date of Birth: March 28, 1922   Medicare Important Message Given:  Yes     [name] 12/21/2021, 9:55 AM

## 2021-12-21 NOTE — Procedures (Signed)
Patient Name: [name]  MRN: [id number]  Epilepsy Attending: Lora Havens  Referring Physician/Provider: Lora Havens, MD  Date: 12/20/2021 Duration: 24.51 mins  Patient history: 88 year old female with altered mental status.  EEG to evaluate for seizure.  Level of alertness: Awake  AEDs during EEG study: None  Technical aspects: This EEG study was done with scalp electrodes positioned according to the 10-20 International system of electrode placement. Electrical activity was reviewed with band pass filter of 1-'70Hz'$ , sensitivity of 7 uV/mm, display speed of 24m/sec with a '60Hz'$  notched filter applied as appropriate. EEG data were recorded continuously and digitally stored.  Video monitoring was available and reviewed as appropriate.  Description: The posterior dominant rhythm consists of 8 Hz activity of moderate voltage (25-35 uV) seen predominantly in posterior head regions, symmetric and reactive to eye opening and eye closing. Hyperventilation and photic stimulation were not performed.     IMPRESSION: This study is within normal limits. No seizures or epileptiform discharges were seen throughout the recording.  A normal interictal EEG does not exclude the diagnosis of epilepsy.  [name] OBarbra Sarks

## 2021-12-21 NOTE — Consult Note (Signed)
Cardiology Consultation   Patient ID: [name] MRN: [id number]; DOB: [birth year]  Admit date: 12/19/2021 Date of Consult: 12/21/2021  PCP:  [name], MD   [place] Prov[facility] Cardiologist:  Evalina Field, MD        Patient Profile:   [name] is a 88 y.o. female with a hx of neuroendocrine tumor of stomach, HTN, HLD, Hypothyroidism, dementia, anemia, Stage 3 CKD and recurrent unresponsiveness who is being seen 12/21/2021 for the evaluation of episodes of unresponsiveness and abnormal echocardiogram results at the request of Dr. Carles Collet.  History of Present Illness:   [name] was examined by Dr. Jenetta Downer' Nori Riis in 10/2019 as a new patient referral for episodes of presyncope. Reported having an episode a month prior during which she was minimally responsive to questions and work-up during her hospitalization showed abnormal thyroid studies and low B12, therefore she was started on supplementation. Her episode of unresponsiveness was felt to be a metabolic issue and possibly due to dehydration and poor oral intake.  Symptoms were overall felt to not be cardiac in etiology and she was informed to follow-up with Cardiology as needed going forward.  She presented to [facility] ED on 12/12/2021 for an episode of unresponsiveness and oxygen saturations were at 79% on room air. She had quick improvement in her vitals and received IV fluids and antibiotics for possible UTI with improvement in symptoms and discharged home.  She presented back to the ED on 12/19/2021 after having an episode of unresponsiveness while riding in the car and slumped over in the front seat. She was not able to answer questions upon arrival. Initial labs showed WBC 8.4, Hgb 13.4, platelets 208, Na+ 140, K+ 3.8 and creatinine 1.44 (close to baseline).  Lactic acid 2.1 with repeat values of 2.5 and 1.7. Troponin values were flat at 21, 20 and 19. Blood cultures were obtained and final results are still  pending but no growth at this time. CXR showed small airspace opacity within the inferior aspect of the right upper lobe suspicious for PNA. CT Head showed no acute intracranial abnormalities. Brain MRI was obtained yesterday and showed no acute intracranial abnormalities but was noted to have moderate to severe stenosis of the bilateral supraclinoid ICAs, moderate to severe stenosis of the right M1 segment and moderate multifocal irregularity and narrowing of the right PCA. Carotid Doppler showed 50 to 69% stenosis of the RICA and less than 50% stenosis of the LICA.  She did have a limited echo which showed a highly mobile oval shaped echodensity measuring 1.15 x 0.9 cm that appeared to be attached to the mid anterior/anterolateral wall and protruding into the left ventricular cavity with differential diagnoses including thrombus, tumor/metastases or vegetation with consideration of obtaining a cardiac MRI. Was previously noted on prior echo in 2021.  In talking with the patient today, she is sitting in the chair beside her bed and has removed her hospital gown and is wearing normal clothing.  Denies any current symptoms of chest pain, shortness of breath, dizziness or presyncope.  Says she feels back to baseline and is waiting for her son to come get her.  Past Medical History:  Diagnosis Date   Adult BMI 19-24 kg/sq m 2008 125 lbs   Anemia    Pernicious 2008-HB 12 MCV 119.5; JAN 2012 HB 13.2 MCV 97.6   Carcinoid tumor of stomach 2008   Diverticulosis    GERD (gastroesophageal reflux disease)    Hiatal  hernia    HTN (hypertension)    Hyperlipidemia    Hypertension    Phreesia 02/07/2020   Mild memory loss 02/15/2011   Near syncope 04/18/2014   Pernicious anemia    Pernicious anemia 06/14/2010   Wrist fracture, left 2008    Past Surgical History:  Procedure Laterality Date   COLONOSCOPY  2003 LS   DC/[place] Diverticulosis   EUS  2008 DJ   FNA NO CARCINOID   EUS  JAN 2009   NL EXAM, NO Bx    EUS  [facility] 2011 WO   CARCINOID   UPPER GASTROINTESTINAL ENDOSCOPY  APR 2008 SL[facility]   CARCIONOID   UPPER GASTROINTESTINAL ENDOSCOPY  FEB 2011 [facility]   CARCINOID/mild anemia/large hiatal hernia     Home Medications:  Prior to Admission medications   Medication Sig Start Date End Date Taking? Authorizing Provider  acetaminophen (TYLENOL) 500 MG tablet Take 500 mg by mouth every 6 (six) hours as needed. For pain   Yes [provider]  aspirin EC 81 MG tablet Take 1 tablet (81 mg total) by mouth daily with breakfast. 06/22/21 06/22/22 Yes Emokpae, Courage, MD  donepezil (ARICEPT) 5 MG tablet TAKE 1 TABLET BY MOUTH AT BEDTIME 09/25/21  Yes [name], MD  famotidine (PEPCID) 20 MG tablet Take 20 mg by mouth daily.   Yes [provider]  felodipine (PLENDIL) 5 MG 24 hr tablet Take 1 tablet (5 mg total) by mouth daily. 10/19/21  Yes [name], MD  levothyroxine (SYNTHROID) 25 MCG tablet TAKE 1 TABLET BY MOUTH ONCE DAILY BEFORE BREAKFAST 11/28/21  Yes [name], MD  simvastatin (ZOCOR) 40 MG tablet Take 1 tablet (40 mg total) by mouth daily. 04/24/21  Yes [name], MD  valsartan (DIOVAN) 40 MG tablet Take 1 tablet (40 mg total) by mouth daily. 10/19/21  Yes [name], MD    Inpatient Medications: Scheduled Meds:  aspirin EC  81 mg Oral Q breakfast   cefdinir  300 mg Oral Daily   clopidogrel  75 mg Oral Daily   dextromethorphan-guaiFENesin  1 tablet Oral BID   donepezil  5 mg Oral QHS   enoxaparin (LOVENOX) injection  30 mg Subcutaneous Q24H   famotidine  20 mg Oral Daily   felodipine  5 mg Oral Daily   irbesartan  75 mg Oral Daily   levothyroxine  25 mcg Oral Q0600   simvastatin  40 mg Oral Daily   Continuous Infusions:  PRN Meds: acetaminophen **OR** acetaminophen, ondansetron **OR** ondansetron (ZOFRAN) IV  Allergies:    Allergies  Allergen Reactions   Codeine     Social History:   Social History   Socioeconomic History   Marital status:  Widowed    Spouse name: Not on file   Number of children: 1   Years of education: 78   Highest education level: Not on file  Occupational History   Not on file  Tobacco Use   Smoking status: Never   Smokeless tobacco: Never  Vaping Use   Vaping Use: Never used  Substance and Sexual Activity   Alcohol use: No   Drug use: No   Sexual activity: Not Currently    Birth control/protection: Post-menopausal  Other Topics Concern   Not on file  Social History Narrative   Live with son Ray   Social Determinants of Health   Financial Resource Strain: Low Risk  (02/22/2021)   Overall Financial Resource Strain (CARDIA)    Difficulty  of Paying Living Expenses: Not hard at all  Food Insecurity: No Food Insecurity (12/20/2021)   Hunger Vital Sign    Worried About Running Out of Food in the Last Year: Never true    Ran Out of Food in the Last Year: Never true  Transportation Needs: No Transportation Needs (12/20/2021)   PRAPARE - Hydrologist (Medical): No    Lack of Transportation (Non-Medical): No  Physical Activity: Inactive (02/22/2021)   Exercise Vital Sign    Days of Exercise per Week: 0 days    Minutes of Exercise per Session: 0 min  Stress: No Stress Concern Present (02/22/2021)   [place]    Feeling of Stress : Not at all  Social Connections: Moderately Integrated (02/22/2021)   Social Connection and Isolation Panel [NHANES]    Frequency of Communication with Friends and Family: Three times a week    Frequency of Social Gatherings with Friends and Family: More than three times a week    Attends Religious Services: More than 4 times per year    Active Member of [employer] or Organizations: Yes    Attends Archivist Meetings: Never    Marital Status: Widowed  Intimate Partner Violence: Not At Risk (12/20/2021)   Humiliation, Afraid, Rape, and Kick questionnaire    Fear of Current or  Ex-Partner: No    Emotionally Abused: No    Physically Abused: No    Sexually Abused: No    Family History:    Family History  Problem Relation Age of Onset   Cancer Mother    Heart disease Brother    Kidney disease Son    Colon cancer Neg Hx    Colon polyps Neg Hx      ROS:  Please see the history of present illness.   All other ROS reviewed and negative.     Physical Exam/Data:   Vitals:   12/20/21 0539 12/20/21 1430 12/20/21 2055 12/21/21 0603  BP: 110/61 (!) 147/67 (!) 152/74 (!) 141/74  Pulse: 65 78 (!) 59 67  Resp: '18 18 16 17  '$ Temp: 98.2 F (36.8 C) 98.5 F (36.9 C) 98.4 F (36.9 C) 98.3 F (36.8 C)  TempSrc:    Oral  SpO2: 100% 100% 100% 100%  Weight:      Height:        Intake/Output Summary (Last 24 hours) at 12/21/2021 0912 Last data filed at 12/21/2021 0505 Gross per 24 hour  Intake 710 ml  Output --  Net 710 ml      12/19/2021    5:21 PM 12/12/2021    3:25 PM 10/19/2021   10:05 AM  Last 3 Weights  Weight (lbs) 98 lb 15.8 oz 99 lb 99 lb 12.8 oz  Weight (kg) 44.9 kg 44.906 kg 45.269 kg     Body mass index is 18.7 kg/m.  General: Pleasant, thin elderly female appearing in no acute distress.  HEENT: normal Neck: no JVD Vascular: No carotid bruits; Distal pulses 2+ bilaterally Cardiac:  normal S1, S2; RRR; no murmur Lungs:  clear to auscultation bilaterally, no wheezing, rhonchi or rales  Abd: soft, nontender, no hepatomegaly  Ext: no pitting edema Musculoskeletal:  No deformities, BUE and BLE strength normal and equal Skin: warm and dry  Neuro:  CNs 2-12 intact, no focal abnormalities noted Psych:  Normal affect   EKG:  The EKG was personally reviewed and demonstrates: Normal sinus rhythm, heart  rate 88 with first-degree AV block and prominent P-waves.  Telemetry:  Telemetry was personally reviewed and demonstrates: Normal sinus rhythm with episodes of bradycardia with heart rate ranging from the 50's to 70's. Occasional skipped beats but  no evidence of high-grade AV block.  Relevant CV Studies:  Echocardiogram: 09/2019 IMPRESSIONS     1. Left ventricular ejection fraction, by estimation, is 65 to 70%. The  left ventricle has hyperdynamic function. The left ventricle has no  regional wall motion abnormalities. There is mild concentric left  ventricular hypertrophy. Left ventricular  diastolic function could not be evaluated.   2. Right ventricular systolic function is normal. The right ventricular  size is normal. There is normal pulmonary artery systolic pressure.   3. The mitral valve is grossly normal. Trivial mitral valve  regurgitation. No evidence of mitral stenosis.   4. The aortic valve is tricuspid. Aortic valve regurgitation is not  visualized. Mild aortic valve sclerosis is present, with no evidence of  aortic valve stenosis.   5. The inferior vena cava is normal in size with greater than 50%  respiratory variability, suggesting right atrial pressure of 3 mmHg.   Comparison(s): No significant change from prior study.   Conclusion(s)/Recommendation(s): Otherwise normal echocardiogram, with  minor abnormalities described in the report.   FINDINGS   Left Ventricle: Hyperdynamic LV, Peak LVOT gradient 16 mmHg but cannot  exclude intracavitary gradient. There is a portion of the papillary muscle  that is highly mobile--may be ruptured chordae, not well visualized, would  not be an etiology for syncope.  Left ventricular ejection fraction, by estimation, is 65 to 70%. The left  ventricle has hyperdynamic function. The left ventricle has no regional  wall motion abnormalities. The left ventricular internal cavity size was  normal in size. There is mild  concentric left ventricular hypertrophy. Left ventricular diastolic  function could not be evaluated.    Echocardiogram: 06/22/2021 IMPRESSIONS     1. Left ventricular ejection fraction, by estimation, is 65 to 70%. The  left ventricle has normal  function. The left ventricle has no regional  wall motion abnormalities. There is moderate left ventricular hypertrophy.  Left ventricular diastolic  parameters are consistent with Grade I diastolic dysfunction (impaired  relaxation).   2. Right ventricular systolic function is normal. The right ventricular  size is normal. There is normal pulmonary artery systolic pressure. The  estimated right ventricular systolic pressure is 69.6 mmHg.   3. Left atrial size was mildly dilated.   4. The mitral valve is normal in structure. No evidence of mitral valve  regurgitation. No evidence of mitral stenosis.   5. The aortic valve is tricuspid. Aortic valve regurgitation is not  visualized. Aortic valve sclerosis is present, with no evidence of aortic  valve stenosis.   6. The inferior vena cava is normal in size with greater than 50%  respiratory variability, suggesting right atrial pressure of 3 mmHg.    Limited Echo: 12/21/2021 IMPRESSIONS     1. Limited Echo to evaluate for syncope   2. There is a highly mobile oval shaped echodensity measuring 1.15 x 0.9  cm that appears to be attached to the mid anterior/anterolateral wall and  protruding into the left ventricular cavity. Differential diagnoses  include thrombus, tumor/metastases,  vegetation. Clinical correlation is recommended. Consider obtaining  cardiac MRI for further evaluation of the echodensity. Left ventricular  ejection fraction, by estimation, is >75%. The left ventricle has  hyperdynamic function. There is mild left  ventricular  hypertrophy. Left ventricular diastolic function could not be  evaluated.   3. Right ventricular systolic function was not well visualized. The right  ventricular size is normal.   Laboratory Data:  High Sensitivity Troponin:   Recent Labs  Lab 12/19/21 1726 12/19/21 2226 12/20/21 0009  TROPONINIHS 21* 20* 19*     Chemistry Recent Labs  Lab 12/19/21 1726 12/19/21 2226 12/20/21 0313  12/21/21 0349  NA 140  --  140 141  K 3.8  --  4.3 4.2  CL 105  --  108 109  CO2 24  --  26 25  GLUCOSE 151*  --  91 86  BUN 19  --  18 15  CREATININE 1.44* 1.31* 1.27* 1.17*  CALCIUM 8.9  --  8.3* 8.4*  MG  --   --  1.9 1.8  GFRNONAA 33* 37* 38* 42*  ANIONGAP 11  --  6 7    Recent Labs  Lab 12/19/21 1726  PROT 7.5  ALBUMIN 3.9  AST 25  ALT 10  ALKPHOS 94  BILITOT 0.5   Lipids No results for input(s): "CHOL", "TRIG", "HDL", "LABVLDL", "LDLCALC", "CHOLHDL" in the last 168 hours.  Hematology Recent Labs  Lab 12/19/21 1726 12/20/21 0313 12/21/21 0349  WBC 8.4 6.8 4.7  RBC 4.30 3.44* 3.40*  HGB 13.4 10.9* 10.5*  HCT 42.8 34.5* 34.4*  MCV 99.5 100.3* 101.2*  MCH 31.2 31.7 30.9  MCHC 31.3 31.6 30.5  RDW 12.2 12.3 12.4  PLT 208 163 147*   Thyroid No results for input(s): "TSH", "FREET4" in the last 168 hours.  BNPNo results for input(s): "BNP", "PROBNP" in the last 168 hours.  DDimer No results for input(s): "DDIMER" in the last 168 hours.   Radiology/Studies:  EEG adult  Result Date: 12/21/2021 Lora Havens, MD     12/21/2021  8:26 AM Patient Name: ORIYA KETTERING MRN: [id number] Epilepsy Attending: Lora Havens Referring Physician/Provider: Lora Havens, MD Date: 12/20/2021 Duration: 24.51 mins Patient history: 88 year old female with altered mental status.  EEG to evaluate for seizure. Level of alertness: Awake AEDs during EEG study: None Technical aspects: This EEG study was done with scalp electrodes positioned according to the 10-20 International system of electrode placement. Electrical activity was reviewed with band pass filter of 1-'70Hz'$ , sensitivity of 7 uV/mm, display speed of 82m/sec with a '60Hz'$  notched filter applied as appropriate. EEG data were recorded continuously and digitally stored.  Video monitoring was available and reviewed as appropriate. Description: The posterior dominant rhythm consists of 8 Hz activity of moderate voltage (25-35 uV)  seen predominantly in posterior head regions, symmetric and reactive to eye opening and eye closing. Hyperventilation and photic stimulation were not performed.   IMPRESSION: This study is within normal limits. No seizures or epileptiform discharges were seen throughout the recording. A normal interictal EEG does not exclude the diagnosis of epilepsy. PLora Havens   [place]oreaCarotid Bilateral  Result Date: 12/20/2021 CLINICAL DATA:  Transient neurologic symptoms Episodes of unresponsiveness Hypertension Syncope Hyperlipidemia EXAM: BILATERAL CAROTID DUPLEX ULTRASOUND TECHNIQUE: GPearline Cablesscale imaging, color Doppler and duplex ultrasound were performed of bilateral carotid and vertebral arteries in the neck. COMPARISON:  06/21/2021 FINDINGS: Criteria: Quantification of carotid stenosis is based on velocity parameters that correlate the residual internal carotid diameter with NASCET-based stenosis levels, using the diameter of the distal internal carotid lumen as the denominator for stenosis measurement. The following velocity measurements were obtained: RIGHT ICA: 127/11 cm/sec CCA: 844/0cm/sec SYSTOLIC  ICA/CCA RATIO:  1.6 ECA: 371 cm/sec LEFT ICA: 57/13 cm/sec CCA: 00/92 cm/sec SYSTOLIC ICA/CCA RATIO:  0.6 ECA: 114 cm/sec RIGHT CAROTID ARTERY: Bulky heterogeneous calcified plaque again seen extending from the distal right common carotid into the proximal internal and external carotid arteries. There has been significant interval decrease in peak systolic velocity which now measures approximately 120 centimeters/second compared to 246 centimeters/second on prior examination. The external carotid artery velocity remains elevated at 371 centimeters/second. RIGHT VERTEBRAL ARTERY:  Antegrade flow. LEFT CAROTID ARTERY: Moderate calcified plaque noted at the carotid bifurcation extending into the proximal internal carotid artery. LEFT VERTEBRAL ARTERY:  Antegrade flow. IMPRESSION: 1. 50-69% stenosis of the right internal  carotid artery. Interval decrease of peak systolic velocity. 2. Less than 50% stenosis of the left internal carotid artery. Electronically Signed   By: Miachel Roux M.D.   On: 12/20/2021 15:09   MR BRAIN WO CONTRAST  Result Date: 12/20/2021 CLINICAL DATA:  Altered mental status, seizure EXAM: MRI HEAD WITHOUT CONTRAST MRA HEAD WITHOUT CONTRAST TECHNIQUE: Multiplanar, multi-echo pulse sequences of the brain and surrounding structures were acquired without intravenous contrast. Angiographic images of the Circle of Willis were acquired using MRA technique without intravenous contrast. COMPARISON:  CT head 1 day prior, brain MRI 09/24/2019 FINDINGS: MRI HEAD FINDINGS Brain: There is no acute intracranial hemorrhage, extra-axial fluid collection, or acute infarct There is background parenchymal volume loss with prominence of the ventricular system and extra-axial CSF spaces. The ventricles are similar in size compared to the prior brain MRI from 2021. Patchy FLAIR signal abnormality in the supratentorial white matter is nonspecific but likely reflects sequela of underlying chronic small-vessel ischemic change, mild for age. There is no suspicious parenchymal signal abnormality or abnormal mass lesion. There is no mass effect or midline shift. Vascular: See below. Skull and upper cervical spine: Normal marrow signal. Sinuses/Orbits: The paranasal sinuses are clear. There is mucosal thickening in the maxillary sinuses with layering fluid. Other: There are small bilateral mastoid effusions. MRA HEAD FINDINGS Anterior circulation: The intracranial ICAs are patent with atherosclerotic irregularity and narrowing resulting in moderate to severe stenosis of the supraclinoid ICAs. There is moderate to severe stenosis of the right M1 segment (16-83). The MCAs are otherwise patent, without other proximal stenosis or occlusion. The ACAs are patent, without proximal high-grade stenosis or occlusion. There is no aneurysm or AVM.  Posterior circulation: The bilateral V4 segments are patent. The basilar artery is patent. The major cerebellar arteries appear patent. There is moderate multifocal irregularity and narrowing of the right PCA with overall diminished enhancement of the P2 segment. The left PCA is patent without proximal high-grade stenosis or occlusion. There is no aneurysm or AVM. Anatomic variants: None. IMPRESSION: 1. Unremarkable for age brain MRI with no acute intracranial pathology. 2. Moderate to severe stenosis of the bilateral supraclinoid ICAs, moderate to severe stenosis of the right M1 segment, and moderate multifocal irregularity and narrowing of the right PCA. 3. Mucosal thickening in the maxillary sinuses with layering fluid which could reflect acute sinusitis in the correct clinical setting. Small bilateral mastoid effusions. Electronically Signed   By: Valetta Mole M.D.   On: 12/20/2021 10:10   MR ANGIO HEAD WO CONTRAST  Result Date: 12/20/2021 CLINICAL DATA:  Altered mental status, seizure EXAM: MRI HEAD WITHOUT CONTRAST MRA HEAD WITHOUT CONTRAST TECHNIQUE: Multiplanar, multi-echo pulse sequences of the brain and surrounding structures were acquired without intravenous contrast. Angiographic images of the Circle of Willis were acquired using MRA technique  without intravenous contrast. COMPARISON:  CT head 1 day prior, brain MRI 09/24/2019 FINDINGS: MRI HEAD FINDINGS Brain: There is no acute intracranial hemorrhage, extra-axial fluid collection, or acute infarct There is background parenchymal volume loss with prominence of the ventricular system and extra-axial CSF spaces. The ventricles are similar in size compared to the prior brain MRI from 2021. Patchy FLAIR signal abnormality in the supratentorial white matter is nonspecific but likely reflects sequela of underlying chronic small-vessel ischemic change, mild for age. There is no suspicious parenchymal signal abnormality or abnormal mass lesion. There is no  mass effect or midline shift. Vascular: See below. Skull and upper cervical spine: Normal marrow signal. Sinuses/Orbits: The paranasal sinuses are clear. There is mucosal thickening in the maxillary sinuses with layering fluid. Other: There are small bilateral mastoid effusions. MRA HEAD FINDINGS Anterior circulation: The intracranial ICAs are patent with atherosclerotic irregularity and narrowing resulting in moderate to severe stenosis of the supraclinoid ICAs. There is moderate to severe stenosis of the right M1 segment (16-83). The MCAs are otherwise patent, without other proximal stenosis or occlusion. The ACAs are patent, without proximal high-grade stenosis or occlusion. There is no aneurysm or AVM. Posterior circulation: The bilateral V4 segments are patent. The basilar artery is patent. The major cerebellar arteries appear patent. There is moderate multifocal irregularity and narrowing of the right PCA with overall diminished enhancement of the P2 segment. The left PCA is patent without proximal high-grade stenosis or occlusion. There is no aneurysm or AVM. Anatomic variants: None. IMPRESSION: 1. Unremarkable for age brain MRI with no acute intracranial pathology. 2. Moderate to severe stenosis of the bilateral supraclinoid ICAs, moderate to severe stenosis of the right M1 segment, and moderate multifocal irregularity and narrowing of the right PCA. 3. Mucosal thickening in the maxillary sinuses with layering fluid which could reflect acute sinusitis in the correct clinical setting. Small bilateral mastoid effusions. Electronically Signed   By: Valetta Mole M.D.   On: 12/20/2021 10:10   CT CHEST WO CONTRAST  Result Date: 12/20/2021 CLINICAL DATA:  Respiratory illness. EXAM: CT CHEST WITHOUT CONTRAST TECHNIQUE: Multidetector CT imaging of the chest was performed following the standard protocol without IV contrast. RADIATION DOSE REDUCTION: This exam was performed according to the departmental  dose-optimization program which includes automated exposure control, adjustment of the mA and/or kV according to patient size and/or use of iterative reconstruction technique. COMPARISON:  December 19, 2021.  February 08, 2020. FINDINGS: Cardiovascular: Atherosclerosis of thoracic aorta is noted without aneurysm formation. Mild cardiomegaly. No pericardial effusion. Mediastinum/Nodes: Large sliding-type hiatal hernia is noted. Thyroid gland is not well visualized. No significant adenopathy is noted. Lungs/Pleura: No pneumothorax or pleural effusion is noted. Mild right lower lobe subsegmental atelectasis is noted. Stable cluster of nodules is seen in lingular segment of left upper lobe with the largest measuring 5 mm. 4 mm nodule is noted anteriorly in right upper lobe best seen on image number 54 series 4. This is not significantly changed compared to prior exam either. Upper Abdomen: Probable cholelithiasis. Calcified splenic granulomas are noted. Musculoskeletal: No chest wall mass or suspicious bone lesions identified. IMPRESSION: Large sliding-type hiatal hernia is noted. Mild right lower lobe subsegmental atelectasis is noted most likely due to mass effect from this hernia. Stable bilateral pulmonary nodules are noted compared with the prior exam. These can be considered benign at this point with no further follow-up required. Cholelithiasis. Aortic Atherosclerosis (ICD10-I70.0). Electronically Signed   By: Bobbe Medico.D.  On: 12/20/2021 09:52   CT Head Wo Contrast  Result Date: 12/19/2021 CLINICAL DATA:  Altered mental status, nontraumatic (Ped 0-17y) EXAM: CT HEAD WITHOUT CONTRAST TECHNIQUE: Contiguous axial images were obtained from the base of the skull through the vertex without intravenous contrast. RADIATION DOSE REDUCTION: This exam was performed according to the departmental dose-optimization program which includes automated exposure control, adjustment of the mA and/or kV according to  patient size and/or use of iterative reconstruction technique. COMPARISON:  CT HEAD 06/21/2021 BRAIN: BRAIN Cerebral ventricle sizes are concordant with the degree of cerebral volume loss. Patchy and confluent areas of decreased attenuation are noted throughout the deep and periventricular white matter of the cerebral hemispheres bilaterally, compatible with chronic microvascular ischemic disease. No evidence of large-territorial acute infarction. No parenchymal hemorrhage. No mass lesion. No extra-axial collection. No mass effect or midline shift. No hydrocephalus. Basilar cisterns are patent. Vascular: No hyperdense vessel. Atherosclerotic calcifications are present within the cavernous internal carotid AND VERTEBRAL arteries. Skull: No acute fracture or focal lesion. Sinuses/Orbits: BILATERAL MAXILLARY MUCOSAL THICKENING. OTHERWISE paranasal sinuses and mastoid air cells are clear. The orbits are unremarkable. Other: None. IMPRESSION: No acute intracranial abnormality. Electronically Signed   By: Iven Finn M.D.   On: 12/19/2021 20:59   DG Chest Port 1 View  Result Date: 12/19/2021 CLINICAL DATA:  Cough, vomiting EXAM: PORTABLE CHEST 1 VIEW COMPARISON:  12/12/2021 FINDINGS: Stable cardiomediastinal contours. Large hiatal hernia. Aortic atherosclerosis. Hyperinflated lungs with coarsened interstitial markings. Small airspace opacity within the inferior aspect of the right upper lobe. No pleural effusion or pneumothorax. IMPRESSION: 1. Small airspace opacity within the inferior aspect of the right upper lobe, suspicious for pneumonia. 2. Large hiatal hernia. Electronically Signed   By: Davina Poke D.O.   On: 12/19/2021 17:36     Assessment and Plan:   1. Abnormal Echocardiogram - Limited echo this admission showed a highly mobile oval shaped echodensity measuring 1.15 x 0.9 cm that appeared to be attached to the mid anterior/anterolateral wall and protruding into the left ventricular cavity  with differential diagnoses including thrombus, tumor/metastases or vegetation with consideration of obtaining a cardiac MRI. This has been noted on prior echo images as well, dating back to 2021. - Unlikely to be infectious given the time-frame this has been present and blood cultures have been negative. Will review with Dr. Dellia Cloud in regards to a Cardiac MRI but unlikely to change the management strategy given her age. If the patient and family wish to pursue further evaluation, a cMRI can be arranged as an outpatient.   2. Episodes of Unresponsiveness/Carotid Artery Stenosis - This has been a recurrent issue for the patient over the years and previously felt to be metabolic in the setting of decreased PO intake. She does have carotid artery stenosis and is currently awaiting transfer to [facility] for further evaluation but the patient and family are unsure if they wish to pursue invasive treatments given her age. Continue ASA and statin therapy (will recheck an FLP - currently on Simvastatin '40mg'$  daily).  - Continue to follow on telemetry this admission. Thus far, she has only been found to have intermittent episodes of bradycardia with heart rate in the 50's but no evidence of high-grade AV block. She is not on any AV nodal blocking agents and would continue to avoid.  3. Elevated Troponin Values - Hs Troponin values have been flat at 21, 20 and 19 this admission. Not consistent with ACS. Her echocardiogram shows a preserved EF.  No indication for further ischemic testing at this time in the setting of no recent anginal symptoms and her advanced age.  4. HTN - BP was at 141/74 on most recent check. Remains on Felodipine '5mg'$  daily and Avapro '75mg'$  daily.   For questions or updates, please contact Anita Please consult [email or web address] for contact info under    Signed, Erma Heritage, PA-C  12/21/2021 9:12 AM

## 2021-12-22 DIAGNOSIS — I10 Essential (primary) hypertension: Secondary | ICD-10-CM | POA: Diagnosis not present

## 2021-12-22 LAB — LIPID PANEL
Cholesterol: 175 mg/dL (ref 0–200)
HDL: 68 mg/dL (ref >40)
LDL Cholesterol: 98 mg/dL (ref 0–99)
Total CHOL/HDL Ratio: 2.6 RATIO
Triglycerides: 47 mg/dL (ref <150)
VLDL: 9 mg/dL (ref 0–40)

## 2021-12-22 MED ORDER — CHLORTHALIDONE 25 MG PO TABS
25.0000 mg | ORAL_TABLET | Freq: Every day | ORAL | Status: DC
  Administered 2021-12-22: 25 mg via ORAL
  Filled 2021-12-22: qty 1

## 2021-12-22 MED ORDER — LABETALOL HCL 5 MG/ML IV SOLN
10.0000 mg | INTRAVENOUS | Status: DC | PRN
  Administered 2021-12-22: 10 mg via INTRAVENOUS
  Filled 2021-12-22: qty 4

## 2021-12-22 MED ORDER — FELODIPINE ER 5 MG PO TB24
7.5000 mg | ORAL_TABLET | Freq: Every day | ORAL | Status: DC
  Administered 2021-12-23: 7.5 mg via ORAL
  Filled 2021-12-22: qty 1

## 2021-12-22 MED ORDER — CHLORTHALIDONE 25 MG PO TABS
12.5000 mg | ORAL_TABLET | Freq: Every day | ORAL | Status: DC
  Administered 2021-12-23: 12.5 mg via ORAL
  Filled 2021-12-22: qty 0.5

## 2021-12-22 MED ORDER — HYDRALAZINE HCL 20 MG/ML IJ SOLN
5.0000 mg | INTRAMUSCULAR | Status: DC | PRN
  Administered 2021-12-22: 5 mg via INTRAVENOUS
  Filled 2021-12-22: qty 1

## 2021-12-22 NOTE — Progress Notes (Signed)
PROGRESS NOTE    [name]  FAO:[id number] DOB: Dec 11, 1922 DOA: 12/19/2021 PCP: Anabel Halon, MD  Chief Complaint  Patient presents with   Altered Mental Status    Brief Narrative:  88 year old female with Rowe Warman history of cognitive impairment, hypertension, hyperlipidemia, hypothyroidism, B12 and iron deficiency presenting with transient episode of unresponsiveness.  The patient has had 6-7 similar episodes in the past year.  On 12/19/2021, the patient was the passenger in the car when her son noted the patient slumped over in the front seat.  He noted that she was foaming at the mouth but did not see any tonic-clonic activity.  They were about 5 minutes from the emergency department, so he drove her to the emergency department.  Upon arrival, the patient was still somnolent but arousable.  She required assistance to transfer to the gurney.  The patient had another similar episode on 12/12/2021.  They came to the emergency department at that time.  She was diagnosed with Audreyanna Butkiewicz UTI.  She was given IV ceftriaxone and sent home with cephalexin which she has finished.  The patient was admitted in June 2023 with similar episode.  EEG at that time was negative.  She was noted to have right carotid stenosis. In the ED, the patient was afebrile hemodynamically stable.  WBC 8.4, hemoglobin 13.4, platelets 208,000.  Sodium 140, potassium 4.3, bicarbonate 26, serum creatinine 1.27.  CT of the brain was negative.  Chest x-ray suggested right lower lobe opacity.  UA showed 11-20 WBC.  Lactic acid peaked at 2.5>> 1.5. MRI of the brain and EEG were ordered.  Neurology was consulted to assist with management.   Assessment & Plan:   Principal Problem:   CAP (community acquired pneumonia) Active Problems:   Carotid disease, bilateral (HCC)   Mixed hyperlipidemia   Essential hypertension   GERD   Syncope and collapse   Acquired hypothyroidism   Major neurocognitive disorder (HCC)   Acute respiratory  failure with hypoxia (HCC)   Lactic acidosis   UTI (urinary tract infection)   Chronic kidney disease, stage 3b (HCC)   Unresponsiveness  Uncontrolled hypertension Continue felodipine, irbesartan (pharmacy substitution for home valsartan) Will increase felodipine to 7.5 mg daily Start chlorthalidone, gave 25 mg today, but will reduced dose to 12.5 in hopes to avoid electrolyte abnormalities in this 88 yo  Continue hydralazine and labetalol prn intravenously  Goal SBP is between 130 and 150 given carotid stenosis per neurology recommendations BP's remain significantly elevated, will follow   Transient episode of unresponsiveness occurring for 3 years (per neurology note, they told neuro 5-6 years), every 4-5 months, more frequently recently unclear cause, appreciate neurology assistance -> concern for cerebral hypoperfusion vs cardiac arrhythmia vs seizure EEG wnl  Head CT without acute abnormality MRI brain unremarkable age brain MRI without acute intracranial pathology.  Moderate to severe stenosis of bilateral supraclinoid ICA's, moderate to severe stenosis of the R M1 segment, and moderate multifocal irregularity and narrowing of the R PCA. Echo as noted below with highly mobile oval shaped echodensity measuring 1.15 x 0.9 cm that appears to be attached to the mid anterior/anterolateral wall and protruding into the L ventricular cavity.   Carotid US with 50-69% stenosis of R ICA, less than 50% stenosis of L ICA Neurology recommending DAPT x 3 months.  They discussed the case with Dr. Quay Burow from neuro IR to see if she was Ellayna Hilligoss candidate for intervention for carotid stenosis.  Per Dr. Don Perking note, Dr. Sherlon Handing  felt patient would be candidate for stenting R ICA.  Neurology recommended laying flat if episode recurs and checking BP.  Keep BP diary, stay hydrated.   Echo abnormality -11/30 Echo--mobile oval shaped echodensity 1.15 x 0.9 cm attached to anterolateral wall--thrombus vs vegetation  vs mass -cardiac MRI recommended by cardiology -I discussed with cardiology here who noted though not present in the report from 06/2021, the echo from 06/2021 showed the same findings as this recent echo from 11/2021.  They did not recommend Benigna Delisi cardiac MRI at this time.  Will continue to discuss with family.   Pulmonary opacity -CT chest--large sliding hiatal hernia with RLL atelectasis mostly due to mass effect from hernia.  Stable pulmonary nodules--no further f/u required -Check procalcitonin <0.10 -d/c IV antibiotics   Enterobacter Cloacae UTI -currently on cefdinir -not clear she's symptomatic, will hold further abx and discuss -follow sensitivities   CKD 3b -baseline creatinine 1.2-1.5   Right carotid stenosis -06/21/2021 carotid ultrasound--right ICA 70-99% stenosis -12/20/21 [place] with 50-69% stenosis on the R - as above, case was discussed with Dr. Quay Burow from Neuro IR who thought she would be Graclynn Vanantwerp candidate for stenting - I would favor Jochebed Bills conservative approach in Mrs. Costanzo given her age and frailty unless the benefits are thought to clearly outweigh the risks of an invasive procedure   Hypothyroidism -Continue Synthroid -10/25/21 TSH 2.880   Minor neurocognitive disorder -Continue Aricept   Mixed hyperlipidemia -Continue Zocor   Lactic acidosis Resolved  Elevated troponin -mild, not c/w ACS     DVT prophylaxis: lovenox Code Status: full Family Communication: son at bedside Disposition:   Status is: Inpatient Remains inpatient appropriate because: continued need for blood pressure management   Consultants:  neurology  Procedures:  Carotid US IMPRESSION: 1. 50-69% stenosis of the right internal carotid artery. Interval decrease of peak systolic velocity. 2. Less than 50% stenosis of the left internal carotid artery.  Echo IMPRESSIONS     1. Limited Echo to evaluate for syncope   2. There is Hamad Whyte highly mobile oval shaped echodensity measuring 1.15 x 0.9   cm that appears to be attached to the mid anterior/anterolateral wall and  protruding into the left ventricular cavity. Differential diagnoses  include thrombus, tumor/metastases,  vegetation. Clinical correlation is recommended. Consider obtaining  cardiac MRI for further evaluation of the echodensity. Left ventricular  ejection fraction, by estimation, is >75%. The left ventricle has  hyperdynamic function. There is mild left  ventricular hypertrophy. Left ventricular diastolic function could not be  evaluated.   3. Right ventricular systolic function was not well visualized. The right  ventricular size is normal.    Antimicrobials:  Anti-infectives (From admission, onward)    Start     Dose/Rate Route Frequency Ordered Stop   12/21/21 1000  cefdinir (OMNICEF) capsule 300 mg  Status:  Discontinued        300 mg Oral Every 12 hours 12/21/21 0750 12/21/21 0801   12/21/21 1000  cefdinir (OMNICEF) capsule 300 mg        300 mg Oral Daily 12/21/21 0801     12/20/21 2200  Ampicillin-Sulbactam (UNASYN) 3 g in sodium chloride 0.9 % 100 mL IVPB  Status:  Discontinued        3 g 200 mL/hr over 30 Minutes Intravenous Every 12 hours 12/19/21 2132 12/21/21 0749   12/19/21 2200  azithromycin (ZITHROMAX) 500 mg in sodium chloride 0.9 % 250 mL IVPB  Status:  Discontinued  500 mg 250 mL/hr over 60 Minutes Intravenous Every 24 hours 12/19/21 2132 12/21/21 0749   12/19/21 2100  ampicillin-sulbactam (UNASYN) 1.5 g in sodium chloride 0.9 % 100 mL IVPB  Status:  Discontinued        1.5 g 200 mL/hr over 30 Minutes Intravenous  Once 12/19/21 2046 12/19/21 2151   12/19/21 2030  cefTRIAXone (ROCEPHIN) 1 g in sodium chloride 0.9 % 100 mL IVPB        1 g 200 mL/hr over 30 Minutes Intravenous  Once 12/19/21 2022 12/19/21 2212       Subjective: Denies any complaints other than mild HA  Objective: Vitals:   12/22/21 0607 12/22/21 0640 12/22/21 0757 12/22/21 1229  BP: (!) 226/90 (!) 184/81 (!)  198/64 (!) 204/81  Pulse: 76 65 69 76  Resp: 18  18 17   Temp: 98.2 F (36.8 C)  98.3 F (36.8 C) 98.2 F (36.8 C)  TempSrc: Oral  Oral Oral  SpO2: 100% 100% 100% 100%  Weight: 43.8 kg     Height:        Intake/Output Summary (Last 24 hours) at 12/22/2021 1752 Last data filed at 12/22/2021 0900 Gross per 24 hour  Intake 117 ml  Output --  Net 117 ml   Filed Weights   12/19/21 1721 12/22/21 0607  Weight: 44.9 kg 43.8 kg    Examination:  General exam: frail, NAD Respiratory system: unlabored Cardiovascular system: RRR Gastrointestinal system: Abdomen is nondistended, soft and nontender. Central nervous system: Alert. No gross focal neurological deficits. Extremities: no LEE   Data Reviewed: I have personally reviewed following labs and imaging studies  CBC: Recent Labs  Lab 12/19/21 1721 12/19/21 1726 12/20/21 0313 12/21/21 0349  WBC  --  8.4 6.8 4.7  NEUTROABS  --  5.1  --   --   HGB 15.0 13.4 10.9* 10.5*  HCT 44.0 42.8 34.5* 34.4*  MCV  --  99.5 100.3* 101.2*  PLT  --  208 163 147*    Basic Metabolic Panel: Recent Labs  Lab 12/19/21 1721 12/19/21 1726 12/19/21 2226 12/20/21 0313 12/21/21 0349  NA 142 140  --  140 141  K 3.9 3.8  --  4.3 4.2  CL 105 105  --  108 109  CO2  --  24  --  26 25  GLUCOSE 150* 151*  --  91 86  BUN 18 19  --  18 15  CREATININE 1.40* 1.44* 1.31* 1.27* 1.17*  CALCIUM  --  8.9  --  8.3* 8.4*  MG  --   --   --  1.9 1.8  PHOS  --   --   --  3.6  --     GFR: Estimated Creatinine Clearance: 19 mL/min (Tavyn Kurka) (by C-G formula based on SCr of 1.17 mg/dL (H)).  Liver Function Tests: Recent Labs  Lab 12/19/21 1726  AST 25  ALT 10  ALKPHOS 94  BILITOT 0.5  PROT 7.5  ALBUMIN 3.9    CBG: No results for input(s): "GLUCAP" in the last 168 hours.   Recent Results (from the past 240 hour(s))  Blood culture (routine x 2)     Status: None (Preliminary result)   Collection Time: 12/19/21  5:26 PM   Specimen: BLOOD RIGHT HAND   Result Value Ref Range Status   Specimen Description BLOOD RIGHT HAND  Final   Special Requests   Final    BOTTLES DRAWN AEROBIC AND ANAEROBIC Blood Culture results may not  be optimal due to an inadequate volume of blood received in culture bottles   Culture   Final    NO GROWTH 3 DAYS Performed at [facility], [address] [place], [place] [address]    Report Status PENDING  Incomplete  Blood culture (routine x 2)     Status: None (Preliminary result)   Collection Time: 12/19/21  5:26 PM   Specimen: BLOOD LEFT FOREARM  Result Value Ref Range Status   Specimen Description BLOOD LEFT FOREARM  Final   Special Requests   Final    BOTTLES DRAWN AEROBIC AND ANAEROBIC Blood Culture adequate volume   Culture   Final    NO GROWTH 3 DAYS Performed at [facility], [address] [place], [place] [address]    Report Status PENDING  Incomplete  Urine Culture     Status: Abnormal (Preliminary result)   Collection Time: 12/19/21  7:22 PM   Specimen: Urine, Clean Catch  Result Value Ref Range Status   Specimen Description   Final    URINE, CLEAN CATCH Performed at [facility], [address], [place], [place] [address]    Special Requests   Final    NONE Performed at [facility], [address] [place], [place] [address]    Culture (Christle Nolting)  Final    40,000 COLONIES/mL ENTEROBACTER CLOACAE CONFIRMATION OF SUSCEPTIBILITIES IN PROGRESS Performed at [facility], [address], [place], [place] [address]    Report Status PENDING  Incomplete         Radiology Studies: EEG adult  Result Date: January 13, 2022 Charlsie Quest, MD     01/13/2022  8:26 AM Patient Name: ANDIA PERRIN MRN: [id number] Epilepsy Attending: Charlsie Quest Referring Physician/Provider: Charlsie Quest, MD Date: 12/20/2021 Duration: 24.51 mins Patient history: 88 year old female with altered mental status.  EEG to evaluate for seizure. Level of alertness: Awake AEDs during EEG study: None Technical aspects:  This EEG study was done with scalp electrodes positioned according to the 10-20 International system of electrode placement. Electrical activity was reviewed with band pass filter of 1-70Hz , sensitivity of 7 uV/mm, display speed of 46mm/sec with Bodey Frizell 60Hz  notched filter applied as appropriate. EEG data were recorded continuously and digitally stored.  Video monitoring was available and reviewed as appropriate. Description: The posterior dominant rhythm consists of 8 Hz activity of moderate voltage (25-35 uV) seen predominantly in posterior head regions, symmetric and reactive to eye opening and eye closing. Hyperventilation and photic stimulation were not performed.   IMPRESSION: This study is within normal limits. No seizures or epileptiform discharges were seen throughout the recording. Autym Siess normal interictal EEG does not exclude the diagnosis of epilepsy. Priyanka Annabelle Harman        Scheduled Meds:  aspirin EC  81 mg Oral Q breakfast   cefdinir  300 mg Oral Daily   chlorthalidone  25 mg Oral Daily   clopidogrel  75 mg Oral Daily   dextromethorphan-guaiFENesin  1 tablet Oral BID   donepezil  5 mg Oral QHS   enoxaparin (LOVENOX) injection  30 mg Subcutaneous Q24H   famotidine  20 mg Oral Daily   felodipine  5 mg Oral Daily   irbesartan  75 mg Oral Daily   levothyroxine  25 mcg Oral Q0600   simvastatin  40 mg Oral Daily   Continuous Infusions:   LOS: 3 days    Time spent: over 30 min    Lacretia Nicks, MD [facility]  To contact the attending provider between 7A-7P or the covering provider during after hours 7P-7A, please log into the web site [email or web address] and access using universal [place] password for that web site. If you do not have the password, please call the hospital operator.  12/22/2021, 5:52 PM

## 2021-12-22 NOTE — Progress Notes (Signed)
Patient admitted to [phone]. Patient alert to self and place. [facility] paged to be informed of admission and vital signs. Blood pressure 226/90. Awaiting response.

## 2021-12-22 NOTE — Progress Notes (Signed)
Pt oriented to person, disoriented to place, situation, and time. Diminished lung sounds present. Pt reported 0/10 pain and slept through the night. Pt being transferred to [facility], report given to care link and [name] nurse.

## 2021-12-22 NOTE — Evaluation (Signed)
Physical Therapy Evaluation Patient Details Name: [name] MRN: [id number] DOB: 21-Jul-1922 Today's Date: 12/22/2021  History of Present Illness  Pt is a 88 y.o. female admitted 12/19/21 with transient episode of unresponsiveness; of note, pt with 6-7 similar episodes in the past year. Brain MRI negative for acute injury. 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 Chest CT with large hiatal hernia, RLL atelectasis. Transfer to [facility] for eval of carotid artery stenosis, potential R ICA stent. EEG 12/1 WNL. PMH includes HTN, HLD, minor neurocognitive disorder.   Clinical Impression  Pt presents with an overall decrease in functional mobility secondary to above. PTA, pt indep ambulating household distances, live with son's family who assists with ADL/iADLs as needed. Today, pt moving well with HHA to stabilize; mobility limited by high BP (up to 251/112), pt asymptomatic; MD outside of room and notified. Son present and supportive, reports no concerns for pt's return home. Pt would benefit from continued acute PT services to maximize functional mobility and independence prior to d/c home.      HR 82, SpO2 100% on RA    Recommendations for follow up therapy are one component of a multi-disciplinary discharge planning process, led by the attending physician.  Recommendations may be updated based on patient status, additional functional criteria and insurance authorization.  Follow Up Recommendations No PT follow up      Assistance Recommended at Discharge Frequent or constant Supervision/Assistance  Patient can return home with the following  A little help with bathing/dressing/bathroom;Direct supervision/assist for medications management;Direct supervision/assist for financial management;Assistance with cooking/housework;Assist for transportation;Help with stairs or ramp for entrance    Equipment Recommendations None recommended by PT   Recommendations for Other Services   Mobility Specialist   Functional Status Assessment Patient has had a recent decline in their functional status and demonstrates the ability to make significant improvements in function in a reasonable and predictable amount of time.     Precautions / Restrictions Precautions Precautions: Fall;Other (comment) Precaution Comments: watch BP (SBP goal 130-150) Restrictions Weight Bearing Restrictions: No      Mobility  Bed Mobility Overal bed mobility: Modified Independent                  Transfers Overall transfer level: Needs assistance Equipment used: None Transfers: Sit to/from Stand Sit to Stand: Min guard           General transfer comment: able to stand from from EOB and recliner without DME, min guard for balance    Ambulation/Gait Ambulation/Gait assistance: Min assist Gait Distance (Feet): 8 Feet Assistive device: 1 person hand held assist Gait Pattern/deviations: Step-to pattern, Step-through pattern, Decreased stride length, Trunk flexed Gait velocity: Decreased     General Gait Details: pt reaching for HHA to stabilize before taking steps, minA for UE support and stability; deferred ambulation beyond to chair and window (pt wanted to look out) due to hypertension  Stairs            Wheelchair Mobility    Modified Rankin (Stroke Patients Only)       Balance Overall balance assessment: Needs assistance   Sitting balance-Leahy Scale: Good     Standing balance support: No upper extremity supported, During functional activity Standing balance-Leahy Scale: Fair Standing balance comment: can static stand without UE support, unable to accept challenge  Pertinent Vitals/Pain Pain Assessment Pain Assessment: No/denies pain    Home Living Family/patient expects to be discharged to:: Private residence Living Arrangements: Children Available Help at Discharge:  Family;Available PRN/intermittently Type of Home: House Home Access: Level entry       Home Layout: Able to live on main level with bedroom/bathroom Home Equipment: Shower seat Additional Comments: Lives with son and daughter-in-law available for assist as needed    Prior Function Prior Level of Function : Independent/Modified Independent             Mobility Comments: Reports mod indep household ambulator without DME ADLs Comments: Family assists with ADL/iADLs as needed     Hand Dominance        Extremity/Trunk Assessment   Upper Extremity Assessment Upper Extremity Assessment: Generalized weakness    Lower Extremity Assessment Lower Extremity Assessment: Generalized weakness    Cervical / Trunk Assessment Cervical / Trunk Assessment: Kyphotic  Communication   Communication: No difficulties  Cognition Arousal/Alertness: Awake/alert Behavior During Therapy: WFL for tasks assessed/performed Overall Cognitive Status: History of cognitive impairments - at baseline                                 General Comments: following simple commands well, increased time; some poor memory of DME at home ("ask my son") and disorientatino to date, which son reports is baseline        General Comments General comments (skin integrity, edema, etc.): pt's son present at end of session, reports no concerns for pt's return home and assist available, declines need for HHPT services. mobility limited by high BP (up to 251/112), pt asymptomatic, MD outside of room and notified. HR 82, SpO2 100% on RA    Exercises     Assessment/Plan    PT Assessment Patient needs continued PT services  PT Problem List Decreased strength;Decreased activity tolerance;Decreased balance;Decreased mobility;Cardiopulmonary status limiting activity;Decreased knowledge of use of DME       PT Treatment Interventions DME instruction;Gait training;Stair training;Functional mobility  training;Therapeutic activities;Therapeutic exercise;Patient/family education    PT Goals (Current goals can be found in the Care Plan section)  Acute Rehab PT Goals Patient Stated Goal: return home PT Goal Formulation: With patient Time For Goal Achievement: 01/05/22 Potential to Achieve Goals: Good    Frequency Min 3X/week     Co-evaluation               AM-PAC PT "6 Clicks" Mobility  Outcome Measure Help needed turning from your back to your side while in a flat bed without using bedrails?: None Help needed moving from lying on your back to sitting on the side of a flat bed without using bedrails?: None Help needed moving to and from a bed to a chair (including a wheelchair)?: A Little Help needed standing up from a chair using your arms (e.g., wheelchair or bedside chair)?: A Little Help needed to walk in hospital room?: A Little Help needed climbing 3-5 steps with a railing? : A Lot 6 Click Score: 19    End of Session   Activity Tolerance: Patient tolerated treatment well;Treatment limited secondary to medical complications (Comment) Patient left: in chair;with call bell/phone within reach;with family/visitor present;with chair alarm set Nurse Communication: Mobility status;Other (comment) (HTN) PT Visit Diagnosis: Other abnormalities of gait and mobility (R26.89)    Time: 2800-3491 PT Time Calculation (min) (ACUTE ONLY): 22 min   Charges:   PT Evaluation $  PT Eval Moderate Complexity: 1 Mod        Mabeline Caras, PT, DPT Acute Rehabilitation Services  Personal: [facility] Office: [place] 12/22/2021, 12:14 PM

## 2021-12-22 NOTE — Progress Notes (Signed)
[facility] night cross cover note:   I was notified by RN that this patient who has arrived as admit from [facility] has BP 226/90. I reviewed documentation from neuro consult via Dr Hortense Ramal, with rec for goal SBP of 130-150 mmHg. Currently no prn and hypertensive orders, and does not appear to be on any AV nodal blocking agents by mouth.  Heart rates in the 70s.  I subsequently started her for prn IV labetalol for systolic blood pressure greater than 160 mmHg.    Babs Bertin, DO Hospitalist

## 2021-12-23 DIAGNOSIS — R404 Transient alteration of awareness: Secondary | ICD-10-CM

## 2021-12-23 DIAGNOSIS — R4182 Altered mental status, unspecified: Secondary | ICD-10-CM | POA: Insufficient documentation

## 2021-12-23 LAB — CBC WITH DIFFERENTIAL/PLATELET
Abs Immature Granulocytes: 0.01 10*3/uL (ref 0.00–0.07)
Basophils Absolute: 0 10*3/uL (ref 0.0–0.1)
Basophils Relative: 0 %
Eosinophils Absolute: 0 10*3/uL (ref 0.0–0.5)
Eosinophils Relative: 1 %
HCT: 34.4 % — ABNORMAL LOW (ref 36.0–46.0)
Hemoglobin: 11.6 g/dL — ABNORMAL LOW (ref 12.0–15.0)
Immature Granulocytes: 0 %
Lymphocytes Relative: 14 %
Lymphs Abs: 0.9 10*3/uL (ref 0.7–4.0)
MCH: 32.1 pg (ref 26.0–34.0)
MCHC: 33.7 g/dL (ref 30.0–36.0)
MCV: 95.3 fL (ref 80.0–100.0)
Monocytes Absolute: 0.4 10*3/uL (ref 0.1–1.0)
Monocytes Relative: 6 %
Neutro Abs: 4.7 10*3/uL (ref 1.7–7.7)
Neutrophils Relative %: 79 %
Platelets: 180 10*3/uL (ref 150–400)
RBC: 3.61 MIL/uL — ABNORMAL LOW (ref 3.87–5.11)
RDW: 12 % (ref 11.5–15.5)
WBC: 6 10*3/uL (ref 4.0–10.5)
nRBC: 0 % (ref 0.0–0.2)

## 2021-12-23 LAB — COMPREHENSIVE METABOLIC PANEL
ALT: 9 U/L (ref 0–44)
AST: 18 U/L (ref 15–41)
Albumin: 3.1 g/dL — ABNORMAL LOW (ref 3.5–5.0)
Alkaline Phosphatase: 67 U/L (ref 38–126)
Anion gap: 12 (ref 5–15)
BUN: 11 mg/dL (ref 8–23)
CO2: 23 mmol/L (ref 22–32)
Calcium: 8.8 mg/dL — ABNORMAL LOW (ref 8.9–10.3)
Chloride: 103 mmol/L (ref 98–111)
Creatinine, Ser: 1.2 mg/dL — ABNORMAL HIGH (ref 0.44–1.00)
GFR, Estimated: 41 mL/min — ABNORMAL LOW (ref >60)
Glucose, Bld: 105 mg/dL — ABNORMAL HIGH (ref 70–99)
Potassium: 3.7 mmol/L (ref 3.5–5.1)
Sodium: 138 mmol/L (ref 135–145)
Total Bilirubin: 0.7 mg/dL (ref 0.3–1.2)
Total Protein: 5.8 g/dL — ABNORMAL LOW (ref 6.5–8.1)

## 2021-12-23 LAB — CARBAPENEM RESISTANCE PANEL
Carba Resistance IMP Gene: NOT DETECTED
Carba Resistance KPC Gene: NOT DETECTED
Carba Resistance NDM Gene: NOT DETECTED
Carba Resistance OXA48 Gene: NOT DETECTED
Carba Resistance VIM Gene: NOT DETECTED

## 2021-12-23 LAB — URINE CULTURE: Culture: 40000 — AB

## 2021-12-23 LAB — MAGNESIUM: Magnesium: 1.8 mg/dL (ref 1.7–2.4)

## 2021-12-23 LAB — PHOSPHORUS: Phosphorus: 3.6 mg/dL (ref 2.5–4.6)

## 2021-12-23 MED ORDER — FAMOTIDINE 10 MG PO TABS: 10.0000 mg | ORAL_TABLET | Freq: Every day | ORAL | 0 refills | Status: DC

## 2021-12-23 MED ORDER — CLOPIDOGREL BISULFATE 75 MG PO TABS: 75.0000 mg | ORAL_TABLET | Freq: Every day | ORAL | 2 refills | Status: DC

## 2021-12-23 MED ORDER — CHLORTHALIDONE 25 MG PO TABS: 12.5000 mg | ORAL_TABLET | Freq: Every day | ORAL | 0 refills | Status: DC

## 2021-12-23 MED ORDER — FELODIPINE ER 2.5 MG PO TB24: 7.5000 mg | ORAL_TABLET | Freq: Every day | ORAL | 0 refills | Status: DC

## 2021-12-23 NOTE — Plan of Care (Signed)

## 2021-12-23 NOTE — Discharge Summary (Signed)
Physician Discharge Summary  [name] GQQ:[id number] DOB: 1922/06/17 DOA: 12/19/2021  PCP: Jessica Spar, MD  Admit date: 12/19/2021 Discharge date: 12/23/2021  Time spent: 40 minutes  Recommendations for Outpatient Follow-up:  Follow outpatient CBC/CMP  Follow episodes of AMS outpatient - neurology outpatient referral placed Abnormal echo 11/2021 noted, but cardiac MRI deferred after discussion with cardiology at [name] who noted the echo seemed similar to echo's from 06/2021 and 09/2019.  Follow outpatient as needed. Neurology discussed her carotid stenosis with neuro IR who felt she was potentially [name] candidate for procedure, can continue this discussion outpatient, though I would favor conservative management for [name] unless the benefits are thought to clearly outweigh the risks for [name] procedure. Asymptomatic James, follow for sx outpatient    Discharge Diagnoses:  Principal Problem:   Altered mental status Active Problems:   Carotid disease, bilateral (HCC)   Mixed hyperlipidemia   Essential hypertension   GERD   Syncope and collapse   Acquired hypothyroidism   Major neurocognitive disorder ([place])   CAP (community acquired pneumonia)   Acute respiratory failure with hypoxia (HCC)   Lactic acidosis   James (urinary tract infection)   Chronic kidney disease, stage 3b ([place])   Unresponsiveness   Discharge Condition: stable  Diet recommendation: heart healthy  Filed Weights   12/19/21 1721 12/22/21 0607  Weight: 44.9 kg 43.8 kg    History of present illness:  88 year old female with [name] history of cognitive impairment, hypertension, hyperlipidemia, hypothyroidism, B12 and iron deficiency presenting with transient episode of unresponsiveness.  The patient has had 6-7 similar episodes in the past year.  On 12/19/2021, the patient was the passenger in the car when her son noted the patient slumped over in the front seat.  He noted that she was foaming at the mouth but did  not see any tonic-clonic activity.  They were about 5 minutes from the emergency department, so he drove her to the emergency department.  Upon arrival, the patient was still somnolent but arousable.  She required assistance to transfer to the gurney.  The patient had another similar episode on 12/12/2021.  They came to the emergency department at that time.  She was diagnosed with [name].  She was given IV ceftriaxone and sent home with cephalexin which she has finished.  The patient was admitted in June 2023 with similar episode.  EEG at that time was negative.  She was noted to have right carotid stenosis.  In the ED, the patient was afebrile hemodynamically stable.  WBC 8.4, hemoglobin 13.4, platelets 208,000.  Sodium 140, potassium 4.3, bicarbonate 26, serum creatinine 1.27.  CT of the brain was negative.  Chest x-ray suggested right lower lobe opacity.  UA showed 11-20 WBC.  Lactic acid peaked at 2.5>> 1.5. MRI of the brain and EEG were ordered.  Neurology was consulted to assist with management.  She was seen by neurology and had an extensive workup showing carotid artery disease and cerebral vascular disease.  She had an echo that was abnormal and she was transferred to [name] for cardiac MRI.  After discussion with cardiology here, cardiac MRI was deferred due to the fact that on their review of prior echos, the findings appeared chronic.  She had significant hypertension that was managed with adjustment with her blood pressure while she was here.    See below for additional details    Hospital Course:  Assessment and Plan: Transient episode of unresponsiveness occurring for 3 years (per  AEDs during EEG study: None Technical aspects: This EEG study was done with scalp electrodes positioned according to the 10-20 International system of electrode placement. Electrical activity was reviewed with band pass filter of 1-'70Hz'$ , sensitivity of 7 uV/mm, display speed of 41m/sec with [name] '60Hz'$  notched filter applied as appropriate. EEG data were recorded continuously and digitally stored.  Video monitoring was available and reviewed as appropriate. Description: The posterior dominant rhythm consists of 8 Hz activity of moderate voltage (25-35 uV) seen predominantly in posterior head regions, symmetric and reactive to eye opening and eye closing. Hyperventilation and photic stimulation were not performed.   IMPRESSION: This study is within normal limits. No seizures or epileptiform discharges were seen throughout the recording. [name] normal interictal EEG does not exclude the diagnosis of epilepsy. [name]  ECHOCARDIOGRAM LIMITED  Result Date: 12/20/2021    ECHOCARDIOGRAM LIMITED REPORT   Patient Name:   Jessica SERENODate of Exam: 12/20/2021 Medical Rec #:  [id number]     Height:       61.0 in Accession #:    [id number]    Weight:       99.0 lb Date of Birth:  [birth year]     BSA:          1.401 m Patient Age:    89years       BP:           110/61 mmHg Patient Gender: F              HR:           65 bpm. [facility]  [facility]: Limited Echo and Cardiac Doppler Indications:    R55 Syncope  History:        Patient has prior history of Echocardiogram examinations, most                 recent 06/22/2021. Risk Factors:Hypertension and Dyslipidemia.  Sonographer:    BAlvino James  Referring Phys: [id number] James IMPRESSIONS  1. Limited Echo to evaluate for syncope  2. There is Akshar Starnes highly mobile oval shaped echodensity measuring 1.15 x 0.9 cm that appears to be attached to the mid anterior/anterolateral wall and protruding into the left ventricular cavity. Differential diagnoses include thrombus, tumor/metastases, vegetation. Clinical correlation is recommended. Consider obtaining cardiac MRI for further evaluation of the echodensity. Left ventricular ejection fraction, by estimation, is >75%. The left ventricle has hyperdynamic function. There is mild left ventricular hypertrophy. Left ventricular diastolic function could not be evaluated.  3. Right ventricular systolic function was not well visualized. The right ventricular size is normal. FINDINGS  Left Ventricle: There is Ramon Brant highly mobile oval shaped echodensity measuring 1.15 x 0.9 cm that appears to be attached to the mid anterior/anterolateral wall and protruding into the left ventricular cavity. Differential diagnoses include thrombus, tumor/metastases, vegetation. Clinical correlation is recommended. Consider obtaining cardiac MRI for further evaluation of the echodensity. Left ventricular ejection fraction, by estimation, is >75%. The left ventricle has hyperdynamic function. The left ventricular internal cavity size was small. There is mild left ventricular hypertrophy. Left ventricular diastolic function could not be evaluated. Right Ventricle: The right ventricular size is normal. Right vetricular wall thickness was not well visualized. Right ventricular systolic function was not well visualized. Left Atrium: Left atrial size was normal in size. Right Atrium: Right atrial size was normal in size. Pericardium: Trivial pericardial effusion is present. Mitral Valve: The mitral valve is degenerative  AEDs during EEG study: None Technical aspects: This EEG study was done with scalp electrodes positioned according to the 10-20 International system of electrode placement. Electrical activity was reviewed with band pass filter of 1-'70Hz'$ , sensitivity of 7 uV/mm, display speed of 41m/sec with [name] '60Hz'$  notched filter applied as appropriate. EEG data were recorded continuously and digitally stored.  Video monitoring was available and reviewed as appropriate. Description: The posterior dominant rhythm consists of 8 Hz activity of moderate voltage (25-35 uV) seen predominantly in posterior head regions, symmetric and reactive to eye opening and eye closing. Hyperventilation and photic stimulation were not performed.   IMPRESSION: This study is within normal limits. No seizures or epileptiform discharges were seen throughout the recording. [name] normal interictal EEG does not exclude the diagnosis of epilepsy. [name]  ECHOCARDIOGRAM LIMITED  Result Date: 12/20/2021    ECHOCARDIOGRAM LIMITED REPORT   Patient Name:   Jessica SERENODate of Exam: 12/20/2021 Medical Rec #:  [id number]     Height:       61.0 in Accession #:    [id number]    Weight:       99.0 lb Date of Birth:  [birth year]     BSA:          1.401 m Patient Age:    89years       BP:           110/61 mmHg Patient Gender: F              HR:           65 bpm. [facility]am Location[facility]  [facility]: Limited Echo and Cardiac Doppler Indications:    R55 Syncope  History:        Patient has prior history of Echocardiogram examinations, most                 recent 06/22/2021. Risk Factors:Hypertension and Dyslipidemia.  Sonographer:    BAlvino James  Referring Phys: [id number] James IMPRESSIONS  1. Limited Echo to evaluate for syncope  2. There is Akshar Starnes highly mobile oval shaped echodensity measuring 1.15 x 0.9 cm that appears to be attached to the mid anterior/anterolateral wall and protruding into the left ventricular cavity. Differential diagnoses include thrombus, tumor/metastases, vegetation. Clinical correlation is recommended. Consider obtaining cardiac MRI for further evaluation of the echodensity. Left ventricular ejection fraction, by estimation, is >75%. The left ventricle has hyperdynamic function. There is mild left ventricular hypertrophy. Left ventricular diastolic function could not be evaluated.  3. Right ventricular systolic function was not well visualized. The right ventricular size is normal. FINDINGS  Left Ventricle: There is Ramon Brant highly mobile oval shaped echodensity measuring 1.15 x 0.9 cm that appears to be attached to the mid anterior/anterolateral wall and protruding into the left ventricular cavity. Differential diagnoses include thrombus, tumor/metastases, vegetation. Clinical correlation is recommended. Consider obtaining cardiac MRI for further evaluation of the echodensity. Left ventricular ejection fraction, by estimation, is >75%. The left ventricle has hyperdynamic function. The left ventricular internal cavity size was small. There is mild left ventricular hypertrophy. Left ventricular diastolic function could not be evaluated. Right Ventricle: The right ventricular size is normal. Right vetricular wall thickness was not well visualized. Right ventricular systolic function was not well visualized. Left Atrium: Left atrial size was normal in size. Right Atrium: Right atrial size was normal in size. Pericardium: Trivial pericardial effusion is present. Mitral Valve: The mitral valve is degenerative  AEDs during EEG study: None Technical aspects: This EEG study was done with scalp electrodes positioned according to the 10-20 International system of electrode placement. Electrical activity was reviewed with band pass filter of 1-'70Hz'$ , sensitivity of 7 uV/mm, display speed of 41m/sec with [name] '60Hz'$  notched filter applied as appropriate. EEG data were recorded continuously and digitally stored.  Video monitoring was available and reviewed as appropriate. Description: The posterior dominant rhythm consists of 8 Hz activity of moderate voltage (25-35 uV) seen predominantly in posterior head regions, symmetric and reactive to eye opening and eye closing. Hyperventilation and photic stimulation were not performed.   IMPRESSION: This study is within normal limits. No seizures or epileptiform discharges were seen throughout the recording. [name] normal interictal EEG does not exclude the diagnosis of epilepsy. [name]  ECHOCARDIOGRAM LIMITED  Result Date: 12/20/2021    ECHOCARDIOGRAM LIMITED REPORT   Patient Name:   Jessica SERENODate of Exam: 12/20/2021 Medical Rec #:  [id number]     Height:       61.0 in Accession #:    [id number]    Weight:       99.0 lb Date of Birth:  [birth year]     BSA:          1.401 m Patient Age:    89years       BP:           110/61 mmHg Patient Gender: F              HR:           65 bpm. Exam Location:  [facility]: Limited Echo and Cardiac Doppler Indications:    R55 Syncope  History:        Patient has prior history of Echocardiogram examinations, most                 recent 06/22/2021. Risk Factors:Hypertension and Dyslipidemia.  Sonographer:    BAlvino James  Referring Phys: [id number] James IMPRESSIONS  1. Limited Echo to evaluate for syncope  2. There is Akshar Starnes highly mobile oval shaped echodensity measuring 1.15 x 0.9 cm that appears to be attached to the mid anterior/anterolateral wall and protruding into the left ventricular cavity. Differential diagnoses include thrombus, tumor/metastases, vegetation. Clinical correlation is recommended. Consider obtaining cardiac MRI for further evaluation of the echodensity. Left ventricular ejection fraction, by estimation, is >75%. The left ventricle has hyperdynamic function. There is mild left ventricular hypertrophy. Left ventricular diastolic function could not be evaluated.  3. Right ventricular systolic function was not well visualized. The right ventricular size is normal. FINDINGS  Left Ventricle: There is Ramon Brant highly mobile oval shaped echodensity measuring 1.15 x 0.9 cm that appears to be attached to the mid anterior/anterolateral wall and protruding into the left ventricular cavity. Differential diagnoses include thrombus, tumor/metastases, vegetation. Clinical correlation is recommended. Consider obtaining cardiac MRI for further evaluation of the echodensity. Left ventricular ejection fraction, by estimation, is >75%. The left ventricle has hyperdynamic function. The left ventricular internal cavity size was small. There is mild left ventricular hypertrophy. Left ventricular diastolic function could not be evaluated. Right Ventricle: The right ventricular size is normal. Right vetricular wall thickness was not well visualized. Right ventricular systolic function was not well visualized. Left Atrium: Left atrial size was normal in size. Right Atrium: Right atrial size was normal in size. Pericardium: Trivial pericardial effusion is present. Mitral Valve: The mitral valve is degenerative  AEDs during EEG study: None Technical aspects: This EEG study was done with scalp electrodes positioned according to the 10-20 International system of electrode placement. Electrical activity was reviewed with band pass filter of 1-'70Hz'$ , sensitivity of 7 uV/mm, display speed of 41m/sec with [name] '60Hz'$  notched filter applied as appropriate. EEG data were recorded continuously and digitally stored.  Video monitoring was available and reviewed as appropriate. Description: The posterior dominant rhythm consists of 8 Hz activity of moderate voltage (25-35 uV) seen predominantly in posterior head regions, symmetric and reactive to eye opening and eye closing. Hyperventilation and photic stimulation were not performed.   IMPRESSION: This study is within normal limits. No seizures or epileptiform discharges were seen throughout the recording. [name] normal interictal EEG does not exclude the diagnosis of epilepsy. [name]  ECHOCARDIOGRAM LIMITED  Result Date: 12/20/2021    ECHOCARDIOGRAM LIMITED REPORT   Patient Name:   Jessica SERENODate of Exam: 12/20/2021 Medical Rec #:  [id number]     Height:       61.0 in Accession #:    [id number]    Weight:       99.0 lb Date of Birth:  [birth year]     BSA:          1.401 m Patient Age:    89years       BP:           110/61 mmHg Patient Gender: F              HR:           65 bpm. Exam Location:  [facility]: Limited Echo and Cardiac Doppler Indications:    R55 Syncope  History:        Patient has prior history of Echocardiogram examinations, most                 recent 06/22/2021. Risk Factors:Hypertension and Dyslipidemia.  Sonographer:    BAlvino James  Referring Phys: [id number] James IMPRESSIONS  1. Limited Echo to evaluate for syncope  2. There is Akshar Starnes highly mobile oval shaped echodensity measuring 1.15 x 0.9 cm that appears to be attached to the mid anterior/anterolateral wall and protruding into the left ventricular cavity. Differential diagnoses include thrombus, tumor/metastases, vegetation. Clinical correlation is recommended. Consider obtaining cardiac MRI for further evaluation of the echodensity. Left ventricular ejection fraction, by estimation, is >75%. The left ventricle has hyperdynamic function. There is mild left ventricular hypertrophy. Left ventricular diastolic function could not be evaluated.  3. Right ventricular systolic function was not well visualized. The right ventricular size is normal. FINDINGS  Left Ventricle: There is Ramon Brant highly mobile oval shaped echodensity measuring 1.15 x 0.9 cm that appears to be attached to the mid anterior/anterolateral wall and protruding into the left ventricular cavity. Differential diagnoses include thrombus, tumor/metastases, vegetation. Clinical correlation is recommended. Consider obtaining cardiac MRI for further evaluation of the echodensity. Left ventricular ejection fraction, by estimation, is >75%. The left ventricle has hyperdynamic function. The left ventricular internal cavity size was small. There is mild left ventricular hypertrophy. Left ventricular diastolic function could not be evaluated. Right Ventricle: The right ventricular size is normal. Right vetricular wall thickness was not well visualized. Right ventricular systolic function was not well visualized. Left Atrium: Left atrial size was normal in size. Right Atrium: Right atrial size was normal in size. Pericardium: Trivial pericardial effusion is present. Mitral Valve: The mitral valve is degenerative  Physician Discharge Summary  [name] GQQ:[id number] DOB: 1922/06/17 DOA: 12/19/2021  PCP: Jessica Spar, MD  Admit date: 12/19/2021 Discharge date: 12/23/2021  Time spent: 40 minutes  Recommendations for Outpatient Follow-up:  Follow outpatient CBC/CMP  Follow episodes of AMS outpatient - neurology outpatient referral placed Abnormal echo 11/2021 noted, but cardiac MRI deferred after discussion with cardiology at [name] who noted the echo seemed similar to echo's from 06/2021 and 09/2019.  Follow outpatient as needed. Neurology discussed her carotid stenosis with neuro IR who felt she was potentially [name] candidate for procedure, can continue this discussion outpatient, though I would favor conservative management for [name] unless the benefits are thought to clearly outweigh the risks for [name] procedure. Asymptomatic James, follow for sx outpatient    Discharge Diagnoses:  Principal Problem:   Altered mental status Active Problems:   Carotid disease, bilateral (HCC)   Mixed hyperlipidemia   Essential hypertension   GERD   Syncope and collapse   Acquired hypothyroidism   Major neurocognitive disorder ([place])   CAP (community acquired pneumonia)   Acute respiratory failure with hypoxia (HCC)   Lactic acidosis   James (urinary tract infection)   Chronic kidney disease, stage 3b ([place])   Unresponsiveness   Discharge Condition: stable  Diet recommendation: heart healthy  Filed Weights   12/19/21 1721 12/22/21 0607  Weight: 44.9 kg 43.8 kg    History of present illness:  88 year old female with [name] history of cognitive impairment, hypertension, hyperlipidemia, hypothyroidism, B12 and iron deficiency presenting with transient episode of unresponsiveness.  The patient has had 6-7 similar episodes in the past year.  On 12/19/2021, the patient was the passenger in the car when her son noted the patient slumped over in the front seat.  He noted that she was foaming at the mouth but did  not see any tonic-clonic activity.  They were about 5 minutes from the emergency department, so he drove her to the emergency department.  Upon arrival, the patient was still somnolent but arousable.  She required assistance to transfer to the gurney.  The patient had another similar episode on 12/12/2021.  They came to the emergency department at that time.  She was diagnosed with [name].  She was given IV ceftriaxone and sent home with cephalexin which she has finished.  The patient was admitted in June 2023 with similar episode.  EEG at that time was negative.  She was noted to have right carotid stenosis.  In the ED, the patient was afebrile hemodynamically stable.  WBC 8.4, hemoglobin 13.4, platelets 208,000.  Sodium 140, potassium 4.3, bicarbonate 26, serum creatinine 1.27.  CT of the brain was negative.  Chest x-ray suggested right lower lobe opacity.  UA showed 11-20 WBC.  Lactic acid peaked at 2.5>> 1.5. MRI of the brain and EEG were ordered.  Neurology was consulted to assist with management.  She was seen by neurology and had an extensive workup showing carotid artery disease and cerebral vascular disease.  She had an echo that was abnormal and she was transferred to [name] for cardiac MRI.  After discussion with cardiology here, cardiac MRI was deferred due to the fact that on their review of prior echos, the findings appeared chronic.  She had significant hypertension that was managed with adjustment with her blood pressure while she was here.    See below for additional details    Hospital Course:  Assessment and Plan: Transient episode of unresponsiveness occurring for 3 years (per  AEDs during EEG study: None Technical aspects: This EEG study was done with scalp electrodes positioned according to the 10-20 International system of electrode placement. Electrical activity was reviewed with band pass filter of 1-'70Hz'$ , sensitivity of 7 uV/mm, display speed of 41m/sec with [name] '60Hz'$  notched filter applied as appropriate. EEG data were recorded continuously and digitally stored.  Video monitoring was available and reviewed as appropriate. Description: The posterior dominant rhythm consists of 8 Hz activity of moderate voltage (25-35 uV) seen predominantly in posterior head regions, symmetric and reactive to eye opening and eye closing. Hyperventilation and photic stimulation were not performed.   IMPRESSION: This study is within normal limits. No seizures or epileptiform discharges were seen throughout the recording. [name] normal interictal EEG does not exclude the diagnosis of epilepsy. [name]  ECHOCARDIOGRAM LIMITED  Result Date: 12/20/2021    ECHOCARDIOGRAM LIMITED REPORT   Patient Name:   Jessica SERENODate of Exam: 12/20/2021 Medical Rec #:  [id number]     Height:       61.0 in Accession #:    [id number]    Weight:       99.0 lb Date of Birth:  [birth year]     BSA:          1.401 m Patient Age:    89years       BP:           110/61 mmHg Patient Gender: F              HR:           65 bpm. Exam Location:  [facility]cedure: Limited Echo and Cardiac Doppler Indications:    R55 Syncope  History:        Patient has prior history of Echocardiogram examinations, most                 recent 06/22/2021. Risk Factors:Hypertension and Dyslipidemia.  Sonographer:    BAlvino James  Referring Phys: [id number] James IMPRESSIONS  1. Limited Echo to evaluate for syncope  2. There is Akshar Starnes highly mobile oval shaped echodensity measuring 1.15 x 0.9 cm that appears to be attached to the mid anterior/anterolateral wall and protruding into the left ventricular cavity. Differential diagnoses include thrombus, tumor/metastases, vegetation. Clinical correlation is recommended. Consider obtaining cardiac MRI for further evaluation of the echodensity. Left ventricular ejection fraction, by estimation, is >75%. The left ventricle has hyperdynamic function. There is mild left ventricular hypertrophy. Left ventricular diastolic function could not be evaluated.  3. Right ventricular systolic function was not well visualized. The right ventricular size is normal. FINDINGS  Left Ventricle: There is Ramon Brant highly mobile oval shaped echodensity measuring 1.15 x 0.9 cm that appears to be attached to the mid anterior/anterolateral wall and protruding into the left ventricular cavity. Differential diagnoses include thrombus, tumor/metastases, vegetation. Clinical correlation is recommended. Consider obtaining cardiac MRI for further evaluation of the echodensity. Left ventricular ejection fraction, by estimation, is >75%. The left ventricle has hyperdynamic function. The left ventricular internal cavity size was small. There is mild left ventricular hypertrophy. Left ventricular diastolic function could not be evaluated. Right Ventricle: The right ventricular size is normal. Right vetricular wall thickness was not well visualized. Right ventricular systolic function was not well visualized. Left Atrium: Left atrial size was normal in size. Right Atrium: Right atrial size was normal in size. Pericardium: Trivial pericardial effusion is present. Mitral Valve: The mitral valve is degenerative  Physician Discharge Summary  [name] GQQ:[id number] DOB: 1922/06/17 DOA: 12/19/2021  PCP: Jessica Spar, MD  Admit date: 12/19/2021 Discharge date: 12/23/2021  Time spent: 40 minutes  Recommendations for Outpatient Follow-up:  Follow outpatient CBC/CMP  Follow episodes of AMS outpatient - neurology outpatient referral placed Abnormal echo 11/2021 noted, but cardiac MRI deferred after discussion with cardiology at [name] who noted the echo seemed similar to echo's from 06/2021 and 09/2019.  Follow outpatient as needed. Neurology discussed her carotid stenosis with neuro IR who felt she was potentially [name] candidate for procedure, can continue this discussion outpatient, though I would favor conservative management for [name] unless the benefits are thought to clearly outweigh the risks for [name] procedure. Asymptomatic James, follow for sx outpatient    Discharge Diagnoses:  Principal Problem:   Altered mental status Active Problems:   Carotid disease, bilateral (HCC)   Mixed hyperlipidemia   Essential hypertension   GERD   Syncope and collapse   Acquired hypothyroidism   Major neurocognitive disorder ([place])   CAP (community acquired pneumonia)   Acute respiratory failure with hypoxia (HCC)   Lactic acidosis   James (urinary tract infection)   Chronic kidney disease, stage 3b ([place])   Unresponsiveness   Discharge Condition: stable  Diet recommendation: heart healthy  Filed Weights   12/19/21 1721 12/22/21 0607  Weight: 44.9 kg 43.8 kg    History of present illness:  88 year old female with [name] history of cognitive impairment, hypertension, hyperlipidemia, hypothyroidism, B12 and iron deficiency presenting with transient episode of unresponsiveness.  The patient has had 6-7 similar episodes in the past year.  On 12/19/2021, the patient was the passenger in the car when her son noted the patient slumped over in the front seat.  He noted that she was foaming at the mouth but did  not see any tonic-clonic activity.  They were about 5 minutes from the emergency department, so he drove her to the emergency department.  Upon arrival, the patient was still somnolent but arousable.  She required assistance to transfer to the gurney.  The patient had another similar episode on 12/12/2021.  They came to the emergency department at that time.  She was diagnosed with [name].  She was given IV ceftriaxone and sent home with cephalexin which she has finished.  The patient was admitted in June 2023 with similar episode.  EEG at that time was negative.  She was noted to have right carotid stenosis.  In the ED, the patient was afebrile hemodynamically stable.  WBC 8.4, hemoglobin 13.4, platelets 208,000.  Sodium 140, potassium 4.3, bicarbonate 26, serum creatinine 1.27.  CT of the brain was negative.  Chest x-ray suggested right lower lobe opacity.  UA showed 11-20 WBC.  Lactic acid peaked at 2.5>> 1.5. MRI of the brain and EEG were ordered.  Neurology was consulted to assist with management.  She was seen by neurology and had an extensive workup showing carotid artery disease and cerebral vascular disease.  She had an echo that was abnormal and she was transferred to [name] for cardiac MRI.  After discussion with cardiology here, cardiac MRI was deferred due to the fact that on their review of prior echos, the findings appeared chronic.  She had significant hypertension that was managed with adjustment with her blood pressure while she was here.    See below for additional details    Hospital Course:  Assessment and Plan: Transient episode of unresponsiveness occurring for 3 years (per  Physician Discharge Summary  [name] GQQ:[id number] DOB: 1922/06/17 DOA: 12/19/2021  PCP: Jessica Spar, MD  Admit date: 12/19/2021 Discharge date: 12/23/2021  Time spent: 40 minutes  Recommendations for Outpatient Follow-up:  Follow outpatient CBC/CMP  Follow episodes of AMS outpatient - neurology outpatient referral placed Abnormal echo 11/2021 noted, but cardiac MRI deferred after discussion with cardiology at [name] who noted the echo seemed similar to echo's from 06/2021 and 09/2019.  Follow outpatient as needed. Neurology discussed her carotid stenosis with neuro IR who felt she was potentially [name] candidate for procedure, can continue this discussion outpatient, though I would favor conservative management for [name] unless the benefits are thought to clearly outweigh the risks for [name] procedure. Asymptomatic James, follow for sx outpatient    Discharge Diagnoses:  Principal Problem:   Altered mental status Active Problems:   Carotid disease, bilateral (HCC)   Mixed hyperlipidemia   Essential hypertension   GERD   Syncope and collapse   Acquired hypothyroidism   Major neurocognitive disorder ([place])   CAP (community acquired pneumonia)   Acute respiratory failure with hypoxia (HCC)   Lactic acidosis   James (urinary tract infection)   Chronic kidney disease, stage 3b ([place])   Unresponsiveness   Discharge Condition: stable  Diet recommendation: heart healthy  Filed Weights   12/19/21 1721 12/22/21 0607  Weight: 44.9 kg 43.8 kg    History of present illness:  88 year old female with [name] history of cognitive impairment, hypertension, hyperlipidemia, hypothyroidism, B12 and iron deficiency presenting with transient episode of unresponsiveness.  The patient has had 6-7 similar episodes in the past year.  On 12/19/2021, the patient was the passenger in the car when her son noted the patient slumped over in the front seat.  He noted that she was foaming at the mouth but did  not see any tonic-clonic activity.  They were about 5 minutes from the emergency department, so he drove her to the emergency department.  Upon arrival, the patient was still somnolent but arousable.  She required assistance to transfer to the gurney.  The patient had another similar episode on 12/12/2021.  They came to the emergency department at that time.  She was diagnosed with [name].  She was given IV ceftriaxone and sent home with cephalexin which she has finished.  The patient was admitted in June 2023 with similar episode.  EEG at that time was negative.  She was noted to have right carotid stenosis.  In the ED, the patient was afebrile hemodynamically stable.  WBC 8.4, hemoglobin 13.4, platelets 208,000.  Sodium 140, potassium 4.3, bicarbonate 26, serum creatinine 1.27.  CT of the brain was negative.  Chest x-ray suggested right lower lobe opacity.  UA showed 11-20 WBC.  Lactic acid peaked at 2.5>> 1.5. MRI of the brain and EEG were ordered.  Neurology was consulted to assist with management.  She was seen by neurology and had an extensive workup showing carotid artery disease and cerebral vascular disease.  She had an echo that was abnormal and she was transferred to [name] for cardiac MRI.  After discussion with cardiology here, cardiac MRI was deferred due to the fact that on their review of prior echos, the findings appeared chronic.  She had significant hypertension that was managed with adjustment with her blood pressure while she was here.    See below for additional details    Hospital Course:  Assessment and Plan: Transient episode of unresponsiveness occurring for 3 years (per  Physician Discharge Summary  [name] GQQ:[id number] DOB: 1922/06/17 DOA: 12/19/2021  PCP: Jessica Spar, MD  Admit date: 12/19/2021 Discharge date: 12/23/2021  Time spent: 40 minutes  Recommendations for Outpatient Follow-up:  Follow outpatient CBC/CMP  Follow episodes of AMS outpatient - neurology outpatient referral placed Abnormal echo 11/2021 noted, but cardiac MRI deferred after discussion with cardiology at [name] who noted the echo seemed similar to echo's from 06/2021 and 09/2019.  Follow outpatient as needed. Neurology discussed her carotid stenosis with neuro IR who felt she was potentially [name] candidate for procedure, can continue this discussion outpatient, though I would favor conservative management for [name] unless the benefits are thought to clearly outweigh the risks for [name] procedure. Asymptomatic James, follow for sx outpatient    Discharge Diagnoses:  Principal Problem:   Altered mental status Active Problems:   Carotid disease, bilateral (HCC)   Mixed hyperlipidemia   Essential hypertension   GERD   Syncope and collapse   Acquired hypothyroidism   Major neurocognitive disorder ([place])   CAP (community acquired pneumonia)   Acute respiratory failure with hypoxia (HCC)   Lactic acidosis   James (urinary tract infection)   Chronic kidney disease, stage 3b ([place])   Unresponsiveness   Discharge Condition: stable  Diet recommendation: heart healthy  Filed Weights   12/19/21 1721 12/22/21 0607  Weight: 44.9 kg 43.8 kg    History of present illness:  88 year old female with [name] history of cognitive impairment, hypertension, hyperlipidemia, hypothyroidism, B12 and iron deficiency presenting with transient episode of unresponsiveness.  The patient has had 6-7 similar episodes in the past year.  On 12/19/2021, the patient was the passenger in the car when her son noted the patient slumped over in the front seat.  He noted that she was foaming at the mouth but did  not see any tonic-clonic activity.  They were about 5 minutes from the emergency department, so he drove her to the emergency department.  Upon arrival, the patient was still somnolent but arousable.  She required assistance to transfer to the gurney.  The patient had another similar episode on 12/12/2021.  They came to the emergency department at that time.  She was diagnosed with [name].  She was given IV ceftriaxone and sent home with cephalexin which she has finished.  The patient was admitted in June 2023 with similar episode.  EEG at that time was negative.  She was noted to have right carotid stenosis.  In the ED, the patient was afebrile hemodynamically stable.  WBC 8.4, hemoglobin 13.4, platelets 208,000.  Sodium 140, potassium 4.3, bicarbonate 26, serum creatinine 1.27.  CT of the brain was negative.  Chest x-ray suggested right lower lobe opacity.  UA showed 11-20 WBC.  Lactic acid peaked at 2.5>> 1.5. MRI of the brain and EEG were ordered.  Neurology was consulted to assist with management.  She was seen by neurology and had an extensive workup showing carotid artery disease and cerebral vascular disease.  She had an echo that was abnormal and she was transferred to [name] for cardiac MRI.  After discussion with cardiology here, cardiac MRI was deferred due to the fact that on their review of prior echos, the findings appeared chronic.  She had significant hypertension that was managed with adjustment with her blood pressure while she was here.    See below for additional details    Hospital Course:  Assessment and Plan: Transient episode of unresponsiveness occurring for 3 years (per

## 2021-12-24 LAB — CULTURE, BLOOD (ROUTINE X 2)
Culture: NO GROWTH
Culture: NO GROWTH
Special Requests: ADEQUATE

## 2021-12-25 ENCOUNTER — Encounter: Payer: Self-pay | Admitting: Internal Medicine

## 2021-12-25 ENCOUNTER — Ambulatory Visit: Payer: PPO | Admitting: Internal Medicine

## 2021-12-25 ENCOUNTER — Encounter (HOSPITAL_COMMUNITY): Payer: Self-pay | Admitting: Hematology

## 2021-12-25 ENCOUNTER — Ambulatory Visit (INDEPENDENT_AMBULATORY_CARE_PROVIDER_SITE_OTHER): Payer: PPO | Admitting: Internal Medicine

## 2021-12-25 VITALS — BP 121/67 | HR 85 | Temp 98.1°F | Ht 61.0 in | Wt 94.0 lb

## 2021-12-25 DIAGNOSIS — I10 Essential (primary) hypertension: Secondary | ICD-10-CM

## 2021-12-25 DIAGNOSIS — I6523 Occlusion and stenosis of bilateral carotid arteries: Secondary | ICD-10-CM | POA: Diagnosis not present

## 2021-12-25 MED ORDER — AMLODIPINE BESYLATE 10 MG PO TABS: 10.0000 mg | ORAL_TABLET | Freq: Every day | ORAL | 1 refills | Status: DC

## 2021-12-25 NOTE — Progress Notes (Unsigned)
CC: Hospitalization Follow-up    HPI:[name] is a 88 y.o. female who presents for hospital follow up. Patient is not having any symptoms of urinary tract infection today. Patient CBC and CMP checked 2 days ago. Did not repeat CBC, mild anemia I expect from phlebotomy seeing trend. Ca corrects to normal on CMP. Creatinine at baseline. Patient does have low albumin and underweight. Today we focused on blood pressure and carotid stenosis. Patient has follow up tomorrow with cardiology. No follow up scheduled with Neurology, will hold off on referral at this time after discussing with patient and family.  For the details of today's visit, please refer to the assessment and plan.   Past Medical History:  Diagnosis Date   Adult BMI 19-24 kg/sq m 2008 125 lbs   Anemia    Pernicious 2008-HB 12 MCV 119.5; JAN 2012 HB 13.2 MCV 97.6   Carcinoid tumor of stomach 2008   Diverticulosis    GERD (gastroesophageal reflux disease)    Hiatal hernia    HTN (hypertension)    Hyperlipidemia    Hypertension    Phreesia 02/07/2020   Mild memory loss 02/15/2011   Near syncope 04/18/2014   Pernicious anemia    Pernicious anemia 06/14/2010   Wrist fracture, left 2008     Physical Exam: Vitals:   12/25/21 1518  BP: 121/67  Pulse: 85  Temp: 98.1 F (36.7 C)  SpO2: 96%  Weight: 94 lb (42.6 kg)  Height: '5\' 1"'$  (1.549 m)     Physical Exam Constitutional:      Appearance: She is well-groomed and underweight.  Neck:     Vascular: Carotid bruit present.  Cardiovascular:     Rate and Rhythm: Normal rate and regular rhythm.  Pulmonary:     Breath sounds: Normal breath sounds. No wheezing or rales.  Neurological:     General: No focal deficit present.     Mental Status: She is alert.  Psychiatric:     Comments: Oriented to person , place,and situation, not to time ( answers year wrong, but close to correct age, says 31). Son says this is baseline      Assessment & Plan:    Essential hypertension BP today 121/67. Patient had transient alteration of awareness and was admitted to hospital on 11/29. She has had similar episodes , estimated by son to have 4-5 episodes over last 4 to 5 years. She has 50 to 69% R Carotid stenosis with decreased peak velocity flow. Left carotid stenosis <50%. Neurology consulted in hospital and recommend SBP goal 130 to 150. Patient on Felodipine 7.5, Valsartan 40, and Chlorthalidone 12.5.   Assessment/Plan: Hypertension, chronic problem. Goal SBP <150.  - Stop chlorthalidone, prefer stopping this medication in geriatric patients  - Continue Valsartan 40  - Stop Felodipine ( son said he was having a hard time getting this medication at [facility]) - Start amlodipine 10 mg    Carotid disease, bilateral ([place]) Carotid US repeated during hospitalization for transient alteration of consciousness. She has 50 to 69% R Carotid stenosis with decreased peak velocity flow. Left carotid stenosis <50%. Dr. Debbrah Alar from Neuro IR who thought she would be a candidate for stenting and transferring patient was discussed. After discussions between family , hospital, and neurology conservative therapy with dual antiplatelet for 3 months was recommended. Discussed this with son and patient during todays visit, and they prefer to continue this therapy at this time and did not want referral to neurology.  Assessment/Plan: Severe stenosis of right internal carotid artery -We are liberalizing BP goal and adjusting blood pressure medication today. - Patient is also on aricept for her dementia. No reported episodes of bradycardia. Patient has been referred to cardiology for heart monitor. Aricept could be stopped if patient has bradycardic episodes on monitoring. -Patient on moderate dose Statin, will increase to high intensity statin. - Continue DAPT for 3 months - Follow up with PCP in January already scheduled     Lorene Dy, MD

## 2021-12-25 NOTE — Patient Instructions (Addendum)
Thank you for trusting me with your care. To recap, today we discussed the following:   High blood pressure - goal BP systolic less than 443  Stop chlorthalidone Stop Feledipine - Start amLODipine (NORVASC) 10 MG tablet; Take 1 tablet (10 mg total) by mouth daily.  Dispense: 30 tablet; Refill: 1 - Continue Valsartan - Follow up with Dr.Patel in January  -Continue Asprin and Plavix for 3 months.

## 2021-12-26 MED ORDER — ROSUVASTATIN CALCIUM 40 MG PO TABS: 40.0000 mg | ORAL_TABLET | Freq: Every day | ORAL | 3 refills | Status: DC

## 2021-12-26 NOTE — Assessment & Plan Note (Signed)
BP today 121/67. Patient had transient alteration of awareness and was admitted to hospital on 11/29. She has had similar episodes , estimated by son to have 4-5 episodes over last 4 to 5 years. She has 50 to 69% R Carotid stenosis with decreased peak velocity flow. Left carotid stenosis <50%. Neurology consulted in hospital and recommend SBP goal 130 to 150. Patient on Felodipine 7.5, Valsartan 40, and Chlorthalidone 12.5.   Assessment/Plan: Hypertension, chronic problem. Goal SBP <150.  - Stop chlorthalidone, prefer stopping this medication in geriatric patients  - Continue Valsartan 40  - Stop Felodipine ( son said he was having a hard time getting this medication at [facility]) - Start amlodipine 10 mg

## 2021-12-26 NOTE — Assessment & Plan Note (Addendum)
Carotid US repeated during hospitalization for transient alteration of consciousness. She has 50 to 69% R Carotid stenosis with decreased peak velocity flow. Left carotid stenosis <50%. Dr. Debbrah Alar from Neuro IR who thought she would be a candidate for stenting and transferring patient was discussed. After discussions between family , hospital, and neurology conservative therapy with dual antiplatelet for 3 months was recommended. Discussed this with son and patient during todays visit, and they prefer to continue this therapy at this time and did not want referral to neurology.   Assessment/Plan: Severe stenosis of right internal carotid artery -We are liberalizing BP goal and adjusting blood pressure medication today. - Patient is also on aricept for her dementia. No reported episodes of bradycardia. Patient has been referred to cardiology for heart monitor. Aricept could be stopped if patient has bradycardic episodes on monitoring. -Patient on moderate dose Statin, will increase to high intensity statin. - Continue DAPT for 3 months - Follow up with PCP in January already scheduled

## 2021-12-27 ENCOUNTER — Telehealth: Payer: Self-pay | Admitting: *Deleted

## 2021-12-27 NOTE — Patient Outreach (Signed)
  Care Coordination TOC Note Transition Care Management Unsuccessful Follow-up Telephone Call  Date of discharge and from where:  [facility] on 12/23/21  Attempts:  1st Attempt. Spoke with son, Jeanell Sparrow, briefly. Unable to complete TOC call because he was currently undergoing physical therapy. Ms Reinheimer was there with him and he reports that she is stable and doing much better. Provided with [facility] telephone number and encouraged to reach out with any questions prior to our telephone f/u tomorrow. Patient is scheduled for outreach on 12/28/21 at 10:30.  Reason for unsuccessful TCM follow-up call:  Unable to reach patient  Chong Sicilian, BSN, RN-BC [place]: [phone] Main #: [phone]

## 2021-12-28 ENCOUNTER — Ambulatory Visit: Payer: PPO | Attending: Student

## 2021-12-28 ENCOUNTER — Encounter: Payer: Self-pay | Admitting: Student

## 2021-12-28 ENCOUNTER — Ambulatory Visit: Payer: Self-pay | Admitting: *Deleted

## 2021-12-28 ENCOUNTER — Encounter: Payer: Self-pay | Admitting: *Deleted

## 2021-12-28 ENCOUNTER — Ambulatory Visit: Payer: PPO | Attending: Student | Admitting: Student

## 2021-12-28 VITALS — BP 116/60 | HR 80 | Ht 62.0 in | Wt 94.0 lb

## 2021-12-28 DIAGNOSIS — R931 Abnormal findings on diagnostic imaging of heart and coronary circulation: Secondary | ICD-10-CM | POA: Diagnosis not present

## 2021-12-28 DIAGNOSIS — R55 Syncope and collapse: Secondary | ICD-10-CM

## 2021-12-28 DIAGNOSIS — I10 Essential (primary) hypertension: Secondary | ICD-10-CM | POA: Diagnosis not present

## 2021-12-28 DIAGNOSIS — I6523 Occlusion and stenosis of bilateral carotid arteries: Secondary | ICD-10-CM

## 2021-12-28 NOTE — Progress Notes (Signed)
Cardiology Office Note    Date:  12/28/2021   ID:  JULIAETTE AGATE, DOB 1922/02/11, MRN [id number]  PCP:  Anabel Halon, MD  Cardiologist: Reatha Harps, MD    Chief Complaint  Patient presents with   Hospitalization Follow-up    History of Present Illness:    [name] is a 88 y.o. female with past medical history of neuroendocrine tumor of stomach, HTN, HLD, Hypothyroidism, dementia, anemia, Stage 3 CKD and recurrent unresponsiveness who presents to the office today for hospital follow-up.   She was most recently admitted to [facility] from 11/29 - 12/23/2021 for evaluation of an episode of unresponsiveness while riding in the car. Troponin values were flat, peaking at 21 and Brain MRI showed no acute intracranial abnormalities. She did have moderate to severe stenosis of the bilateral supraclinoid ICAs and carotid doppler showed 50 to 69% stenosis of the RICA and less than 50% stenosis of the LICA. Cardiology was consulted as limited echocardiogram showed a highly mobile oval shaped echodensity measuring 1.15 x 0.9 cm that appeared to be attached to the mid anterior/anterolateral wall and protruding into the left ventricular cavity with differential diagnoses including thrombus, tumor/metastases or vegetation. The report mentioned to consider a cardiac MRI but this had been noted on prior echocardiogram imaging dating back to 2021. This was reviewed with the patient and her family and given that a cardiac MRI would likely not change the management strategy, this did not need to be pursued during admission. Was recommended to consider a 2-week event monitor upon discharge to rule out conduction abnormalities as the cause of her unresponsiveness but her carotid artery stenosis was felt to be the culprit. Options for carotid intervention were reviewed with IR but in discussion with the patient and her family, they preferred conservative management and it was recommended for her to be on  DAPT for 3 months.  In talking with the patient and her son today, she reports having fatigue since hospital discharge. Her son says her appetite has overall been stable but this varies on a daily basis and she does not consume enough fluids. She denies any specific chest pain or palpitations. No reported orthopnea, PND or pitting edema. No recurrent syncopal episodes.   Past Medical History:  Diagnosis Date   Adult BMI 19-24 kg/sq m 2008 125 lbs   Anemia    Pernicious 2008-HB 12 MCV 119.5; JAN 2012 HB 13.2 MCV 97.6   Carcinoid tumor of stomach 2008   Diverticulosis    GERD (gastroesophageal reflux disease)    Hiatal hernia    HTN (hypertension)    Hyperlipidemia    Hypertension    Phreesia 02/07/2020   Mild memory loss 02/15/2011   Near syncope 04/18/2014   Pernicious anemia    Pernicious anemia 06/14/2010   Wrist fracture, left 2008    Past Surgical History:  Procedure Laterality Date   COLONOSCOPY  2003 LS   DC/[place] Diverticulosis   EUS  2008 DJ   FNA NO CARCINOID   EUS  JAN 2009   NL EXAM, NO Bx   EUS  MAR 2011 WO   CARCINOID   UPPER GASTROINTESTINAL ENDOSCOPY  APR 2008 [facility]   CARCIONOID   UPPER GASTROINTESTINAL ENDOSCOPY  FEB 2011 [facility]   CARCINOID/mild anemia/large hiatal hernia    Current Medications: Outpatient Medications Prior to Visit  Medication Sig Dispense Refill   acetaminophen (TYLENOL) 500 MG tablet Take 500 mg by mouth every 6 (six) hours  as needed. For pain     amLODipine (NORVASC) 10 MG tablet Take 1 tablet (10 mg total) by mouth daily. 30 tablet 1   aspirin EC 81 MG tablet Take 1 tablet (81 mg total) by mouth daily with breakfast. 30 tablet 2   clopidogrel (PLAVIX) 75 MG tablet Take 1 tablet (75 mg total) by mouth daily. For 3 months with 81 mg aspirin 30 tablet 2   donepezil (ARICEPT) 5 MG tablet TAKE 1 TABLET BY MOUTH AT BEDTIME 30 tablet 4   famotidine (PEPCID) 10 MG tablet Take 1 tablet (10 mg total) by mouth daily. 30 tablet 0   levothyroxine  (SYNTHROID) 25 MCG tablet TAKE 1 TABLET BY MOUTH ONCE DAILY BEFORE BREAKFAST 90 tablet 0   rosuvastatin (CRESTOR) 40 MG tablet Take 1 tablet (40 mg total) by mouth daily. 90 tablet 3   valsartan (DIOVAN) 40 MG tablet Take 1 tablet (40 mg total) by mouth daily. 90 tablet 1   No facility-administered medications prior to visit.     Allergies:   Codeine   Social History   Socioeconomic History   Marital status: Widowed    Spouse name: Not on file   Number of children: 1   Years of education: 74   Highest education level: Not on file  Occupational History   Not on file  Tobacco Use   Smoking status: Never   Smokeless tobacco: Never  Vaping Use   Vaping Use: Never used  Substance and Sexual Activity   Alcohol use: No   Drug use: No   Sexual activity: Not Currently    Birth control/protection: Post-menopausal  Other Topics Concern   Not on file  Social History Narrative   Live with son Ray   Social Determinants of Health   Financial Resource Strain: Low Risk  (12/28/2021)   Overall Financial Resource Strain (CARDIA)    Difficulty of Paying Living Expenses: Not hard at all  Food Insecurity: No Food Insecurity (12/20/2021)   Hunger Vital Sign    Worried About Running Out of Food in the Last Year: Never true    Ran Out of Food in the Last Year: Never true  Transportation Needs: No Transportation Needs (12/28/2021)   PRAPARE - Administrator, Civil Service (Medical): No    Lack of Transportation (Non-Medical): No  Physical Activity: Inactive (02/22/2021)   Exercise Vital Sign    Days of Exercise per Week: 0 days    Minutes of Exercise per Session: 0 min  Stress: No Stress Concern Present (02/22/2021)   [employer] of Occupational Health - Occupational Stress Questionnaire    Feeling of Stress : Not at all  Social Connections: Moderately Integrated (02/22/2021)   Social Connection and Isolation Panel [NHANES]    Frequency of Communication with Friends and Family:  Three times a week    Frequency of Social Gatherings with Friends and Family: More than three times a week    Attends Religious Services: More than 4 times per year    Active Member of [employer] or Organizations: Yes    Attends Banker Meetings: Never    Marital Status: Widowed     Family History:  The patient's family history includes Cancer in her mother; Heart disease in her brother; Kidney disease in her son.   Review of Systems:    Please see the history of present illness.     All other systems reviewed and are otherwise negative except as noted above.  Physical Exam:    VS:  BP 116/60   Pulse 80   Ht 5\' 2"  (1.575 m)   Wt 94 lb (42.6 kg)   SpO2 100%   BMI 17.19 kg/m    General: Pleasant, thin elderly female appearing in no acute distress. Head: Normocephalic, atraumatic. Neck: Right carotid bruit.  JVD not elevated.  Lungs: Respirations regular and unlabored, without wheezes or rales.  Heart: Regular rate and rhythm. No S3 or S4.  No murmur, no rubs, or gallops appreciated. Abdomen: Appears non-distended. No obvious abdominal masses. Msk:  Strength and tone appear normal for age. No obvious joint deformities or effusions. Extremities: No clubbing or cyanosis. No pitting edema.  Distal pedal pulses are 2+ bilaterally. Neuro: Alert and oriented X 3. Moves all extremities spontaneously. No focal deficits noted. Psych:  Responds to questions appropriately with a normal affect. Skin: No rashes or lesions noted  Wt Readings from Last 3 Encounters:  12/28/21 94 lb (42.6 kg)  12/25/21 94 lb (42.6 kg)  12/22/21 96 lb 9 oz (43.8 kg)     Studies/Labs Reviewed:   EKG:  EKG is not ordered today.   Recent Labs: 10/25/2021: TSH 2.880 12/23/2021: ALT 9; BUN 11; Creatinine, Ser 1.20; Hemoglobin 11.6; Magnesium 1.8; Platelets 180; Potassium 3.7; Sodium 138   Lipid Panel    Component Value Date/Time   CHOL 175 12/22/2021 0338   TRIG 47 12/22/2021 0338   HDL 68  12/22/2021 0338   CHOLHDL 2.6 12/22/2021 0338   VLDL 9 12/22/2021 0338   LDLCALC 98 12/22/2021 0338    Additional studies/ records that were reviewed today include:   Limited Echocardiogram: 11/2021 IMPRESSIONS     1. Limited Echo to evaluate for syncope   2. There is a highly mobile oval shaped echodensity measuring 1.15 x 0.9  cm that appears to be attached to the mid anterior/anterolateral wall and  protruding into the left ventricular cavity. Differential diagnoses  include thrombus, tumor/metastases,  vegetation. Clinical correlation is recommended. Consider obtaining  cardiac MRI for further evaluation of the echodensity. Left ventricular  ejection fraction, by estimation, is >75%. The left ventricle has  hyperdynamic function. There is mild left  ventricular hypertrophy. Left ventricular diastolic function could not be  evaluated.   3. Right ventricular systolic function was not well visualized. The right  ventricular size is normal.    Carotid Dopplers: 11/2021 IMPRESSION: 1. 50-69% stenosis of the right internal carotid artery. Interval decrease of peak systolic velocity. 2. Less than 50% stenosis of the left internal carotid artery.    Assessment:    1. Syncope, unspecified syncope type   2. Abnormal echocardiogram   3. Bilateral carotid artery stenosis   4. Essential hypertension      Plan:   In order of problems listed above:  1. Syncope - As discussed during prior evaluations, her syncope is likely of a metabolic etiology in the setting of variable PO intake or possibly due to her carotid artery stenosis. It was recommended during her recent admission to pursue a cardiac monitor as an outpatient and we will place a 2-week ZIO monitor today. Would anticipate conservative measures unless found to have significant arrhythmias.  2. Abnormal Echocardiogram - Echocardiogram during her recent admission showed an echodensity measuring 1.15 x 0.9 cm that  appeared to be attached to the mid anterior/anterolateral wall and protruding into the left ventricular cavity with differential diagnoses including thrombus, tumor/metastases or vegetation. This had been noted on prior imaging as well  and the possibility of a cardiac MRI was reviewed but it was felt that this would not change the management strategy given her advanced age. Reviewed with the patient and her son again today and would not pursue further testing at this time given this has been noted on prior images and that she would not be a candidate for aggressive treatment given her age. They are in agreement with this.     3. Carotid Artery Stenosis - She does have known carotid artery stenosis as outlined above and medical management was recommended given her advanced age. Neurology has recommended for her to be on DAPT with ASA and Plavix for 3 months. Also on Crestor 40mg  daily.   4. HTN - Her blood pressure is well-controlled at 116/60 during today's visit. Continue current medical therapy with Amlodipine 10 mg daily and Valsartan 40 mg daily.   Medication Adjustments/Labs and Tests Ordered: Current medicines are reviewed at length with the patient today.  Concerns regarding medicines are outlined above.  Medication changes, Labs and Tests ordered today are listed in the Patient Instructions below. Patient Instructions  Medication Instructions:  Your physician recommends that you continue on your current medications as directed. Please refer to the Current Medication list given to you today.  *If you need a refill on your cardiac medications before your next appointment, please call your pharmacy*   Lab Work: NONE   If you have labs (blood work) drawn today and your tests are completely normal, you will receive your results only by: MyChart Message (if you have MyChart) OR A paper copy in the mail If you have any lab test that is abnormal or we need to change your treatment, we will  call you to review the results.   Testing/Procedures: Christena Deem- Long Term Monitor Instructions   Your physician has requested you wear your ZIO patch monitor__14___days.   This is a single patch monitor.  Irhythm supplies one patch monitor per enrollment.  Additional stickers are not available.   Please do not apply patch if you will be having a Nuclear Stress Test, Echocardiogram, Cardiac CT, MRI, or Chest Xray during the time frame you would be wearing the monitor. The patch cannot be worn during these tests.  You cannot remove and re-apply the ZIO XT patch monitor.   Your ZIO patch monitor will be sent USPS Priority mail from [facility] directly to your home address. The monitor may also be mailed to a PO BOX if home delivery is not available.   It may take 3-5 days to receive your monitor after you have been enrolled.   Once you have received you monitor, please review enclosed instructions.  Your monitor has already been registered assigning a specific monitor serial # to you.   Applying the monitor   Shave hair from upper left chest.   Hold abrader disc by orange tab.  Rub abrader in 40 strokes over left upper chest as indicated in your monitor instructions.   Clean area with 4 enclosed alcohol pads .  Use all pads to assure are is cleaned thoroughly.  Let dry.   Apply patch as indicated in monitor instructions.  Patch will be place under collarbone on left side of chest with arrow pointing upward.   Rub patch adhesive wings for 2 minutes.Remove white label marked "1".  Remove white label marked "2".  Rub patch adhesive wings for 2 additional minutes.   While looking in a mirror, press and release button in  center of patch.  A small green light will flash 3-4 times .  This will be your only indicator the monitor has been turned on.     Do not shower for the first 24 hours.  You may shower after the first 24 hours.   Press button if you feel a symptom. You will hear a  small click.  Record Date, Time and Symptom in the Patient Log Book.   When you are ready to remove patch, follow instructions on last 2 pages of Patient Log Book.  Stick patch monitor onto last page of Patient Log Book.   Place Patient Log Book in [place] box.  Use locking tab on box and tape box closed securely.  The Orange and [employer] has [employer] on it.  Please place in mailbox as soon as possible.  Your physician should have your test results approximately 7 days after the monitor has been mailed back to [facility].   Call [facility] Customer Care at [phone] if you have questions regarding your ZIO XT patch monitor.  Call them immediately if you see an orange light blinking on your monitor.   If your monitor falls off in less than 4 days contact our Monitor department at [phone].  If your monitor becomes loose or falls off after 4 days call [employer] at [phone] for suggestions on securing your monitor.     Follow-Up: At [facility], you and your health needs are our priority.  As part of our continuing mission to provide you with exceptional heart care, we have created designated Provider Care Teams.  These Care Teams include your primary Cardiologist (physician) and Advanced Practice Providers (APPs -  Physician Assistants and Nurse Practitioners) who all work together to provide you with the care you need, when you need it.  We recommend signing up for the patient portal called "MyChart".  Sign up information is provided on this After Visit Summary.  MyChart is used to connect with patients for Virtual Visits (Telemedicine).  Patients are able to view lab/test results, encounter notes, upcoming appointments, etc.  Non-urgent messages can be sent to your provider as well.   To learn more about what you can do with MyChart, go to [email or web address].    Your next appointment:   6 month(s)  The format for your next appointment:   In  Person  Provider:   Randall An, PA-C    Other Instructions Thank you for choosing [facility]!    Important Information About Sugar         Signed, Ellsworth Lennox, PA-C  12/28/2021 4:55 PM    [facility] [address] [place], [place] [address] Phone: [phone] Fax: [phone]

## 2021-12-28 NOTE — Patient Instructions (Signed)
Medication Instructions:  Your physician recommends that you continue on your current medications as directed. Please refer to the Current Medication list given to you today.  *If you need a refill on your cardiac medications before your next appointment, please call your pharmacy*   Lab Work: NONE   If you have labs (blood work) drawn today and your tests are completely normal, you will receive your results only by: [place] (if you have MyChart) OR A paper copy in the mail If you have any lab test that is abnormal or we need to change your treatment, we will call you to review the results.   Testing/Procedures: Bryn Gulling- Long Term Monitor Instructions   Your physician has requested you wear your ZIO patch monitor__14___days.   This is a single patch monitor.  Irhythm supplies one patch monitor per enrollment.  Additional stickers are not available.   Please do not apply patch if you will be having a Nuclear Stress Test, Echocardiogram, Cardiac CT, MRI, or Chest Xray during the time frame you would be wearing the monitor. The patch cannot be worn during these tests.  You cannot remove and re-apply the ZIO XT patch monitor.   Your ZIO patch monitor will be sent USPS Priority mail from [facility] directly to your home address. The monitor may also be mailed to a PO BOX if home delivery is not available.   It may take 3-5 days to receive your monitor after you have been enrolled.   Once you have received you monitor, please review enclosed instructions.  Your monitor has already been registered assigning a specific monitor serial # to you.   Applying the monitor   Shave hair from upper left chest.   Hold abrader disc by orange tab.  Rub abrader in 40 strokes over left upper chest as indicated in your monitor instructions.   Clean area with 4 enclosed alcohol pads .  Use all pads to assure are is cleaned thoroughly.  Let dry.   Apply patch as indicated in monitor  instructions.  Patch will be place under collarbone on left side of chest with arrow pointing upward.   Rub patch adhesive wings for 2 minutes.Remove white label marked "1".  Remove white label marked "2".  Rub patch adhesive wings for 2 additional minutes.   While looking in a mirror, press and release button in center of patch.  A small green light will flash 3-4 times .  This will be your only indicator the monitor has been turned on.     Do not shower for the first 24 hours.  You may shower after the first 24 hours.   Press button if you feel a symptom. You will hear a small click.  Record Date, Time and Symptom in the Patient Log Book.   When you are ready to remove patch, follow instructions on last 2 pages of Patient Log Book.  Stick patch monitor onto last page of Patient Log Book.   Place Patient Log Book in [place] box.  Use locking tab on box and tape box closed securely.  The Orange and [employer] has [employer] on it.  Please place in mailbox as soon as possible.  Your physician should have your test results approximately 7 days after the monitor has been mailed back to [facility].   Call [place] at [phone] if you have questions regarding your ZIO XT patch monitor.  Call them immediately if you see an orange  light blinking on your monitor.   If your monitor falls off in less than 4 days contact our Monitor department at [phone].  If your monitor becomes loose or falls off after 4 days call [employer] at [phone] for suggestions on securing your monitor.     Follow-Up: At [facility], you and your health needs are our priority.  As part of our continuing mission to provide you with exceptional heart care, we have created designated Provider Care Teams.  These Care Teams include your primary Cardiologist (physician) and Advanced Practice Providers (APPs -  Physician Assistants and Nurse Practitioners) who all work together to provide  you with the care you need, when you need it.  We recommend signing up for the patient portal called "MyChart".  Sign up information is provided on this After Visit Summary.  MyChart is used to connect with patients for Virtual Visits (Telemedicine).  Patients are able to view lab/test results, encounter notes, upcoming appointments, etc.  Non-urgent messages can be sent to your provider as well.   To learn more about what you can do with MyChart, go to [email or web address].    Your next appointment:   6 month(s)  The format for your next appointment:   In Person  Provider:   Bernerd Pho, PA-C    Other Instructions Thank you for choosing [facility]!    Important Information About Sugar

## 2021-12-28 NOTE — Patient Outreach (Signed)
  Care Coordination [facility] Note Transition Care Management Follow-up Telephone Call Date of discharge and from where: 12/23/21 from [facility] How have you been since you were released from the hospital? Per son, "She is doing much better. Her thinking and memory are pretty much back to normal" Any questions or concerns? No  Items Reviewed: Did the pt receive and understand the discharge instructions provided? Yes  Medications obtained and verified? Yes Finishing up Keflex. No side effects.  Other? Yes advised that discharge instructions recommended repeating CBC/CMP. Reviewed Dr Lonzo Candy note and he is deferring rck for now. May be needed at PCP visit in January Any new allergies since your discharge? No  Dietary orders reviewed? Yes Do you have support at home? Yes . Lives with son  Home Care and Equipment/Supplies: Were home health services ordered? no If so, what is the name of the agency?   Has the agency set up a time to come to the patient's home? not applicable Were any new equipment or medical supplies ordered?  No What is the name of the medical supply agency?  Were you able to get the supplies/equipment? not applicable Do you have any questions related to the use of the equipment or supplies? No  Functional Questionnaire: (I = Independent and D = Dependent) ADLs: I with assist  Bathing/Dressing- I with assist  Meal Prep- D  Eating- D  Maintaining continence- I  Transferring/Ambulation- I  Managing Meds- D  Follow up appointments reviewed:  PCP Hospital f/u appt confirmed? Yes  Had a visit at PCP office on 12/28/21 with Dr Edward Hospital f/u appt confirmed? Yes  Scheduled to see Bernerd Pho, PA (cardio) on 12/28/21 @ 3:00. Are transportation arrangements needed? No  If their condition worsens, is the pt aware to call PCP or go to the Emergency Dept.? Yes Was the patient provided with contact information for the PCP's office or ED? Yes Was to pt  encouraged to call back with questions or concerns? Yes  SDOH assessments and interventions completed:   Yes SDOH Interventions Today    Flowsheet Row Most Recent Value  SDOH Interventions   Housing Interventions Intervention Not Indicated  Transportation Interventions Intervention Not Indicated  Financial Strain Interventions Intervention Not Indicated       Care Coordination Interventions:  No Care Coordination interventions needed at this time.   Encounter Outcome:  Pt. Visit Completed    Chong Sicilian, BSN, RN-BC RN Care Coordinator [facility] Direct Dial: [phone] Main #: [phone]

## 2022-01-02 ENCOUNTER — Telehealth: Payer: Self-pay | Admitting: Student

## 2022-01-02 NOTE — Telephone Encounter (Signed)
Pt;s son states that pt's [place] Monitor that was applied at visit on 12/8 has come off and they are unable to find. He would like a callback regarding this matter. Please advise

## 2022-01-02 NOTE — Telephone Encounter (Signed)
Zio Rep will ship a new monitor out.   Patients son notified and verbalized understanding.   Will fwd to provider as Juluis Rainier

## 2022-01-02 NOTE — Telephone Encounter (Signed)
Spoke to pt's son who stated that pt pulled monitor off at some point, but he is unsure of when she pulled it off or where as they cannot find the monitor.   Email sent to [facility] rep about replacing monitor and cost as this was another concern of pt's son.

## 2022-01-07 ENCOUNTER — Telehealth: Payer: Self-pay

## 2022-01-07 DIAGNOSIS — R55 Syncope and collapse: Secondary | ICD-10-CM

## 2022-01-07 NOTE — Telephone Encounter (Signed)
Son brought mother in to have replacement Zio put on.They were unable to find the one she had on previously. Monitor [id number] placed on patient. She will wear for 14 days.

## 2022-01-22 ENCOUNTER — Inpatient Hospital Stay: Payer: PPO | Attending: Hematology

## 2022-01-22 VITALS — BP 144/67 | HR 99 | Temp 99.0°F | Resp 16

## 2022-01-22 DIAGNOSIS — E538 Deficiency of other specified B group vitamins: Secondary | ICD-10-CM | POA: Diagnosis not present

## 2022-01-22 DIAGNOSIS — D509 Iron deficiency anemia, unspecified: Secondary | ICD-10-CM

## 2022-01-22 DIAGNOSIS — D510 Vitamin B12 deficiency anemia due to intrinsic factor deficiency: Secondary | ICD-10-CM

## 2022-01-22 MED ORDER — CYANOCOBALAMIN 1000 MCG/ML IJ SOLN
1000.0000 ug | Freq: Once | INTRAMUSCULAR | Status: AC
  Administered 2022-01-22: 1000 ug via INTRAMUSCULAR
  Filled 2022-01-22: qty 1

## 2022-01-22 NOTE — Progress Notes (Signed)
Patient is here today for her B-12 injection per provider orders. She denies any issues since her recent hospitalization.  She remained stable during injection and was discharged ambulatory and in stable condition. She will follow up as scheduled.

## 2022-01-22 NOTE — Patient Instructions (Signed)
You received your B-12 injection today. Please follow up as scheduled for your next injection and visit with the provider.  Please bring [place] a copy of your healthcare power of attorney and living will for your medical records here at [facility].

## 2022-01-29 ENCOUNTER — Telehealth: Payer: Self-pay | Admitting: Student

## 2022-01-29 DIAGNOSIS — R55 Syncope and collapse: Secondary | ICD-10-CM | POA: Diagnosis not present

## 2022-01-29 NOTE — Telephone Encounter (Signed)
Monitor finished- downloaded and sent to provider.

## 2022-01-29 NOTE — Telephone Encounter (Signed)
Received a call from [place] with [employer] stating that pt report shows 4 episodes of complete heart block and 30 runs of SVT.

## 2022-01-29 NOTE — Telephone Encounter (Signed)
Raquel Sarna with [employer] calling with critical zio monitor results

## 2022-01-31 ENCOUNTER — Telehealth: Payer: Self-pay

## 2022-01-31 DIAGNOSIS — I442 Atrioventricular block, complete: Secondary | ICD-10-CM

## 2022-01-31 NOTE — Telephone Encounter (Signed)
I spoke with son,Ray,([phone])  and he agrees to have EP see his mother. He also agrees to go to the [employer] [facility] for initial appointment.

## 2022-01-31 NOTE — Telephone Encounter (Signed)
Mertha Finders, RN talked to son. Ok per [employer]

## 2022-01-31 NOTE — Telephone Encounter (Signed)
-----   Message from Charlie Pitter, [place] sent at 01/31/2022 11:32 AM EST ----- Monitor showed intermittent complete heart block. I spoke with Dr. Audie Box who recommended I call to check on pt to help guide whether we need more emergent evaluation of this versus more conservative approach. He felt reassured by ventricular escape rhythm at 41bpm. Spoke with pt and son Ray -> has not had any further passing out spells or dizziness, no complaints. She no longer drives. ED precautions reviewed. Can we try to get her an EP appointment very soon to evaluate this and help decide more definitively whether this is something we can just monitor clinically versus need for ppm? Thank you!

## 2022-01-31 NOTE — Telephone Encounter (Signed)
Jessica Pitter, PA-C  P [facility] Triage Monitor showed intermittent complete heart block. I spoke with Dr. Audie Box who recommended I call to check on pt to help guide whether we need more emergent evaluation of this versus more conservative approach. He felt reassured by ventricular escape rhythm at 41bpm. Spoke with pt and son [name] -> has not had any further passing out spells or dizziness, no complaints. She no longer drives. ED precautions reviewed. Can we try to get her an EP appointment very soon to evaluate this and help decide more definitively whether this is something we can just monitor clinically versus need for ppm? Thank you!     I placed referral to EP  I will message Gracy Bruins

## 2022-01-31 NOTE — Telephone Encounter (Signed)
  Son agrees to see EP at [facility] office. Urgent referral, scheduler [place] was messaged   Charlie Pitter, PA-C 01/31/2022 11:32 AM EST     Monitor showed intermittent complete heart block. I spoke with Dr. Audie Box who recommended I call to check on pt to help guide whether we need more emergent evaluation of this versus more conservative approach. He felt reassured by ventricular escape rhythm at 41bpm. Spoke with pt and son Ray -> has not had any further passing out spells or dizziness, no complaints. She no longer drives. ED precautions reviewed. Can we try to get her an EP appointment very soon to evaluate this and help decide more definitively whether this is something we can just monitor clinically versus need for ppm? Thank you!

## 2022-02-06 ENCOUNTER — Ambulatory Visit: Payer: PPO | Attending: Internal Medicine | Admitting: Internal Medicine

## 2022-02-06 ENCOUNTER — Encounter: Payer: Self-pay | Admitting: Internal Medicine

## 2022-02-06 VITALS — BP 116/56 | HR 80 | Ht 61.0 in | Wt 96.6 lb

## 2022-02-06 DIAGNOSIS — I495 Sick sinus syndrome: Secondary | ICD-10-CM | POA: Diagnosis not present

## 2022-02-06 NOTE — Patient Instructions (Signed)
Medication Instructions:  Your physician recommends that you continue on your current medications as directed. Please refer to the Current Medication list given to you today.  *If you need a refill on your cardiac medications before your next appointment, please call your pharmacy*   Lab Work: NONE   If you have labs (blood work) drawn today and your tests are completely normal, you will receive your results only by: MyChart Message (if you have MyChart) OR A paper copy in the mail If you have any lab test that is abnormal or we need to change your treatment, we will call you to review the results.   Testing/Procedures: NONE    Follow-Up: At [facility], you and your health needs are our priority.  As part of our continuing mission to provide you with exceptional heart care, we have created designated Provider Care Teams.  These Care Teams include your primary Cardiologist (physician) and Advanced Practice Providers (APPs -  Physician Assistants and Nurse Practitioners) who all work together to provide you with the care you need, when you need it.  We recommend signing up for the patient portal called "MyChart".  Sign up information is provided on this After Visit Summary.  MyChart is used to connect with patients for Virtual Visits (Telemedicine).  Patients are able to view lab/test results, encounter notes, upcoming appointments, etc.  Non-urgent messages can be sent to your provider as well.   To learn more about what you can do with MyChart, go to [email or web address].    Your next appointment:   1 year(s)  Provider:   Gregg Taylor, MD    Other Instructions Thank you for choosing  [facility]!    

## 2022-02-06 NOTE — Progress Notes (Signed)
HPI [name] is referred by  Bernerd Pho for evaluation of altered mentation. She is a pleasant 89 yo woman with dementia who has had a couple of ER visits where she will lose consciousness. The spells last a few seconds but her son describes an episode which lasted several minutes. No associated seizure activity or loss of continence. She denies chest pain. She wore a heart monitor which demonstrated transient bradycardia due to sinus node dysfunction and heart block but without long pauses and with SVT lasting seconds.  Allergies  Allergen Reactions   Codeine      Current Outpatient Medications  Medication Sig Dispense Refill   acetaminophen (TYLENOL) 500 MG tablet Take 500 mg by mouth every 6 (six) hours as needed. For pain     amLODipine (NORVASC) 10 MG tablet Take 1 tablet (10 mg total) by mouth daily. 30 tablet 1   aspirin EC 81 MG tablet Take 1 tablet (81 mg total) by mouth daily with breakfast. 30 tablet 2   clopidogrel (PLAVIX) 75 MG tablet Take 1 tablet (75 mg total) by mouth daily. For 3 months with 81 mg aspirin 30 tablet 2   donepezil (ARICEPT) 5 MG tablet TAKE 1 TABLET BY MOUTH AT BEDTIME 30 tablet 4   famotidine (PEPCID) 10 MG tablet Take 1 tablet (10 mg total) by mouth daily. 30 tablet 0   levothyroxine (SYNTHROID) 25 MCG tablet TAKE 1 TABLET BY MOUTH ONCE DAILY BEFORE BREAKFAST 90 tablet 0   rosuvastatin (CRESTOR) 40 MG tablet Take 1 tablet (40 mg total) by mouth daily. 90 tablet 3   valsartan (DIOVAN) 40 MG tablet Take 1 tablet (40 mg total) by mouth daily. 90 tablet 1   No current facility-administered medications for this visit.     Past Medical History:  Diagnosis Date   Adult BMI 19-24 kg/sq m 2008 125 lbs   Anemia    Pernicious 2008-HB 12 MCV 119.5; JAN 2012 HB 13.2 MCV 97.6   Carcinoid tumor of stomach 2008   Diverticulosis    GERD (gastroesophageal reflux disease)    Hiatal hernia    HTN (hypertension)    Hyperlipidemia    Hypertension     Phreesia 02/07/2020   Mild memory loss 02/15/2011   Near syncope 04/18/2014   Pernicious anemia    Pernicious anemia 06/14/2010   Wrist fracture, left 2008    ROS:   All systems reviewed and negative except as noted in the HPI.   Past Surgical History:  Procedure Laterality Date   COLONOSCOPY  2003 LS   DC/[place] Diverticulosis   EUS  2008 DJ   FNA NO CARCINOID   EUS  JAN 2009   NL EXAM, NO Bx   EUS  [facility] 2011 WO   CARCINOID   UPPER GASTROINTESTINAL ENDOSCOPY  APR 2008 SLF   CARCIONOID   UPPER GASTROINTESTINAL ENDOSCOPY  FEB 2011 SLF   CARCINOID/mild anemia/large hiatal hernia     Family History  Problem Relation Age of Onset   Cancer Mother    Heart disease Brother    Kidney disease Son    Colon cancer Neg Hx    Colon polyps Neg Hx      Social History   Socioeconomic History   Marital status: Widowed    Spouse name: Not on file   Number of children: 1   Years of education: 63   Highest education level: Not on file  Occupational History   Not on file  Tobacco Use   Smoking status: Never   Smokeless tobacco: Never  Vaping Use   Vaping Use: Never used  Substance and Sexual Activity   Alcohol use: No   Drug use: No   Sexual activity: Not Currently    Birth control/protection: Post-menopausal  Other Topics Concern   Not on file  Social History Narrative   Live with son [name]   Social Determinants of Health   Financial Resource Strain: Low Risk  (12/28/2021)   Overall Financial Resource Strain (CARDIA)    Difficulty of Paying Living Expenses: Not hard at all  Food Insecurity: No Food Insecurity (12/20/2021)   Hunger Vital Sign    Worried About Running Out of Food in the Last Year: Never true    Ran Out of Food in the Last Year: Never true  Transportation Needs: No Transportation Needs (12/28/2021)   PRAPARE - Hydrologist (Medical): No    Lack of Transportation (Non-Medical): No  Physical Activity: Inactive (02/22/2021)    Exercise Vital Sign    Days of Exercise per Week: 0 days    Minutes of Exercise per Session: 0 min  Stress: No Stress Concern Present (02/22/2021)   [place]    Feeling of Stress : Not at all  Social Connections: Moderately Integrated (02/22/2021)   Social Connection and Isolation Panel [NHANES]    Frequency of Communication with Friends and Family: Three times a week    Frequency of Social Gatherings with Friends and Family: More than three times a week    Attends Religious Services: More than 4 times per year    Active Member of [employer] or Organizations: Yes    Attends Archivist Meetings: Never    Marital Status: Widowed  Intimate Partner Violence: Not At Risk (12/20/2021)   Humiliation, Afraid, Rape, and Kick questionnaire    Fear of Current or Ex-Partner: No    Emotionally Abused: No    Physically Abused: No    Sexually Abused: No     BP (!) 116/56   Pulse 80   Ht '5\' 1"'$  (1.549 m)   Wt 96 lb 9.6 oz (43.8 kg)   SpO2 (!) 0%   BMI 18.25 kg/m   Physical Exam:  Well appearing elderly woman, NAD HEENT: Unremarkable Neck:  No JVD, no thyromegally Lymphatics:  No adenopathy Back:  No CVA tenderness Lungs:  Clear with no wheezes HEART:  Regular rate rhythm, no murmurs, no rubs, no clicks Abd:  soft, positive bowel sounds, no organomegally, no rebound, no guarding Ext:  2 plus pulses, no edema, no cyanosis, no clubbing Skin:  No rashes no nodules Neuro:  CN II through XII intact, motor grossly intact   Heart monitor - reviewed  Assess/Plan: Altered consciousness - I have recommended she undergo watchful waiting. The prolonged episode of altered consciousness with no rhythm correlation makes the likelihood of symptomatic brady or tachy unlikely. She did not have any long pauses on her heart monitor.  HTN - her bp is well controlled. We will follow.  Carleene Overlie Masashi Snowdon,MD

## 2022-02-07 ENCOUNTER — Ambulatory Visit (INDEPENDENT_AMBULATORY_CARE_PROVIDER_SITE_OTHER): Payer: PPO | Admitting: Internal Medicine

## 2022-02-07 ENCOUNTER — Encounter: Payer: Self-pay | Admitting: Internal Medicine

## 2022-02-07 VITALS — BP 116/60 | HR 69 | Ht 61.0 in | Wt 96.0 lb

## 2022-02-07 DIAGNOSIS — Z0001 Encounter for general adult medical examination with abnormal findings: Secondary | ICD-10-CM

## 2022-02-07 DIAGNOSIS — F02A0 Dementia in other diseases classified elsewhere, mild, without behavioral disturbance, psychotic disturbance, mood disturbance, and anxiety: Secondary | ICD-10-CM | POA: Diagnosis not present

## 2022-02-07 DIAGNOSIS — G301 Alzheimer's disease with late onset: Secondary | ICD-10-CM

## 2022-02-07 DIAGNOSIS — N1832 Chronic kidney disease, stage 3b: Secondary | ICD-10-CM | POA: Diagnosis not present

## 2022-02-07 DIAGNOSIS — I6523 Occlusion and stenosis of bilateral carotid arteries: Secondary | ICD-10-CM | POA: Diagnosis not present

## 2022-02-07 DIAGNOSIS — E782 Mixed hyperlipidemia: Secondary | ICD-10-CM

## 2022-02-07 DIAGNOSIS — R55 Syncope and collapse: Secondary | ICD-10-CM

## 2022-02-07 DIAGNOSIS — D3A092 Benign carcinoid tumor of the stomach: Secondary | ICD-10-CM | POA: Diagnosis not present

## 2022-02-07 DIAGNOSIS — E039 Hypothyroidism, unspecified: Secondary | ICD-10-CM

## 2022-02-07 DIAGNOSIS — I10 Essential (primary) hypertension: Secondary | ICD-10-CM | POA: Diagnosis not present

## 2022-02-07 NOTE — Assessment & Plan Note (Signed)
Lab Results  Component Value Date   TSH 2.880 10/25/2021   On Levothyroxine 25 mcg QD, needs to stay compliant Check TSH and free T4

## 2022-02-07 NOTE — Assessment & Plan Note (Signed)
Benign, rising chromogranin Serum serotonin normal CT abdomen recently did not show any new gastric mass.

## 2022-02-07 NOTE — Assessment & Plan Note (Signed)
On statin.

## 2022-02-07 NOTE — Assessment & Plan Note (Addendum)
Unclear etiology Could be due to dehydration and/or carotid artery stenosis  Advised to maintain adequate hydration On aspirin, Plavix and statin for carotid artery stenosis Had referred to neurology

## 2022-02-07 NOTE — Assessment & Plan Note (Signed)
On Donepezil 5 mg QD Has had slight worsening in her cognition recently - MMSE: 23/30 today

## 2022-02-07 NOTE — Assessment & Plan Note (Signed)
Checked CMP, GFR recently worse likely due to dehydration Avoid nephrotoxic agents Advised to maintain adequate hydration On ARB

## 2022-02-07 NOTE — Progress Notes (Signed)
Established Patient Office Visit  Subjective:  Patient ID: [name], female    DOB: 07-26-1922  Age: 89 y.o. MRN: [id number]  CC:  Chief Complaint  Patient presents with   Annual Exam    HPI [name] is a 89 y.o. female with past medical history of HTN, benign neuroendocrine carcinoid tumor of stomach, iron deficiency anemia and vitamin B12 deficiency who presents for annual physical.  HTN: BP is well-controlled. Takes medications regularly. Patient denies headache, dizziness, chest pain, dyspnea or palpitations.  She had cardiology evaluation for episodes of syncope and collapse.  Cardiac monitor did not show any pauses or significant symptomatic bradycardia.  She has no history of carotid artery stenosis, but she is not a good candidate for surgical intervention.  She takes levothyroxine for history of hypothyroidism.  Denies any recent change in weight or appetite.  She has chronic tremors.      Past Medical History:  Diagnosis Date   Adult BMI 19-24 kg/sq m 2008 125 lbs   Anemia    Pernicious 2008-HB 12 MCV 119.5; JAN 2012 HB 13.2 MCV 97.6   Carcinoid tumor of stomach 2008   Diverticulosis    GERD (gastroesophageal reflux disease)    Hiatal hernia    HTN (hypertension)    Hyperlipidemia    Hypertension    Phreesia 02/07/2020   Mild memory loss 02/15/2011   Near syncope 04/18/2014   Pernicious anemia    Pernicious anemia 06/14/2010   Wrist fracture, left 2008    Past Surgical History:  Procedure Laterality Date   COLONOSCOPY  2003 LS   DC/[place] Diverticulosis   EUS  2008 DJ   FNA NO CARCINOID   EUS  JAN 2009   NL EXAM, NO Bx   EUS  [facility] 2011 WO   CARCINOID   UPPER GASTROINTESTINAL ENDOSCOPY  APR 2008 SLF   CARCIONOID   UPPER GASTROINTESTINAL ENDOSCOPY  FEB 2011 SLF   CARCINOID/mild anemia/large hiatal hernia    Family History  Problem Relation Age of Onset   Cancer Mother    Heart disease Brother    Kidney disease Son    Colon cancer Neg Hx     Colon polyps Neg Hx     Social History   Socioeconomic History   Marital status: Widowed    Spouse name: Not on file   Number of children: 1   Years of education: 43   Highest education level: Not on file  Occupational History   Not on file  Tobacco Use   Smoking status: Never   Smokeless tobacco: Never  Vaping Use   Vaping Use: Never used  Substance and Sexual Activity   Alcohol use: No   Drug use: No   Sexual activity: Not Currently    Birth control/protection: Post-menopausal  Other Topics Concern   Not on file  Social History Narrative   Live with son Ray   Social Determinants of Health   Financial Resource Strain: Low Risk  (12/28/2021)   Overall Financial Resource Strain (CARDIA)    Difficulty of Paying Living Expenses: Not hard at all  Food Insecurity: No Food Insecurity (12/20/2021)   Hunger Vital Sign    Worried About Running Out of Food in the Last Year: Never true    [place] in the Last Year: Never true  Transportation Needs: No Transportation Needs (12/28/2021)   PRAPARE - Transportation    Lack of Transportation (Medical): No    Lack of  Transportation (Non-Medical): No  Physical Activity: Inactive (02/22/2021)   Exercise Vital Sign    Days of Exercise per Week: 0 days    Minutes of Exercise per Session: 0 min  Stress: No Stress Concern Present (02/22/2021)   [place]    Feeling of Stress : Not at all  Social Connections: Moderately Integrated (02/22/2021)   Social Connection and Isolation Panel [NHANES]    Frequency of Communication with Friends and Family: Three times a week    Frequency of Social Gatherings with Friends and Family: More than three times a week    Attends Religious Services: More than 4 times per year    Active Member of [employer] or Organizations: Yes    Attends Archivist Meetings: Never    Marital Status: Widowed  Intimate Partner Violence: Not At Risk  (12/20/2021)   Humiliation, Afraid, Rape, and Kick questionnaire    Fear of Current or Ex-Partner: No    Emotionally Abused: No    Physically Abused: No    Sexually Abused: No    Outpatient Medications Prior to Visit  Medication Sig Dispense Refill   acetaminophen (TYLENOL) 500 MG tablet Take 500 mg by mouth every 6 (six) hours as needed. For pain     amLODipine (NORVASC) 10 MG tablet Take 1 tablet (10 mg total) by mouth daily. 30 tablet 1   aspirin EC 81 MG tablet Take 1 tablet (81 mg total) by mouth daily with breakfast. 30 tablet 2   clopidogrel (PLAVIX) 75 MG tablet Take 1 tablet (75 mg total) by mouth daily. For 3 months with 81 mg aspirin 30 tablet 2   donepezil (ARICEPT) 5 MG tablet TAKE 1 TABLET BY MOUTH AT BEDTIME 30 tablet 4   famotidine (PEPCID) 10 MG tablet Take 1 tablet (10 mg total) by mouth daily. 30 tablet 0   levothyroxine (SYNTHROID) 25 MCG tablet TAKE 1 TABLET BY MOUTH ONCE DAILY BEFORE BREAKFAST 90 tablet 0   rosuvastatin (CRESTOR) 40 MG tablet Take 1 tablet (40 mg total) by mouth daily. 90 tablet 3   valsartan (DIOVAN) 40 MG tablet Take 1 tablet (40 mg total) by mouth daily. 90 tablet 1   No facility-administered medications prior to visit.    Allergies  Allergen Reactions   Codeine     ROS Review of Systems  Constitutional:  Negative for chills and fever.  HENT:  Negative for congestion, sinus pressure, sinus pain and sore throat.   Eyes:  Negative for pain and discharge.  Respiratory:  Negative for cough and shortness of breath.   Cardiovascular:  Negative for chest pain and palpitations.  Gastrointestinal:  Negative for abdominal pain, diarrhea, nausea and vomiting.  Endocrine: Negative for polydipsia and polyuria.  Genitourinary:  Negative for dysuria and hematuria.  Musculoskeletal:  Positive for back pain. Negative for neck pain and neck stiffness.  Skin:  Negative for rash.  Neurological:  Positive for syncope. Negative for dizziness, weakness and  headaches.  Psychiatric/Behavioral:  Negative for agitation and behavioral problems.       Objective:    Physical Exam Vitals reviewed.  Constitutional:      General: She is not in acute distress.    Appearance: She is not diaphoretic.  HENT:     Head: Normocephalic and atraumatic.     Nose: Nose normal. No congestion.     Mouth/Throat:     Mouth: Mucous membranes are moist.     Pharynx: No  posterior oropharyngeal erythema.  Eyes:     General: No scleral icterus.    Extraocular Movements: Extraocular movements intact.  Cardiovascular:     Rate and Rhythm: Normal rate and regular rhythm.     Pulses: Normal pulses.     Heart sounds: Normal heart sounds. No murmur heard. Pulmonary:     Breath sounds: Normal breath sounds. No wheezing or rales.  Musculoskeletal:     Cervical back: Neck supple. No tenderness.     Right lower leg: Edema (Trace) present.     Left lower leg: Edema (Trace) present.  Skin:    General: Skin is warm.     Findings: No rash.  Neurological:     General: No focal deficit present.     Mental Status: She is alert and oriented to person, place, and time.     Sensory: No sensory deficit.     Motor: No weakness.  Psychiatric:        Mood and Affect: Mood normal.        Behavior: Behavior normal.     BP 116/60 (BP Location: Left Arm)   Pulse 69   Ht '5\' 1"'$  (1.549 m)   Wt 96 lb (43.5 kg)   SpO2 99%   BMI 18.14 kg/m  Wt Readings from Last 3 Encounters:  02/07/22 96 lb (43.5 kg)  02/06/22 96 lb 9.6 oz (43.8 kg)  12/28/21 94 lb (42.6 kg)    Lab Results  Component Value Date   TSH 2.880 10/25/2021   Lab Results  Component Value Date   WBC 6.0 12/23/2021   HGB 11.6 (L) 12/23/2021   HCT 34.4 (L) 12/23/2021   MCV 95.3 12/23/2021   PLT 180 12/23/2021   Lab Results  Component Value Date   NA 138 12/23/2021   K 3.7 12/23/2021   CO2 23 12/23/2021   GLUCOSE 105 (H) 12/23/2021   BUN 11 12/23/2021   CREATININE 1.20 (H) 12/23/2021   BILITOT 0.7  12/23/2021   ALKPHOS 67 12/23/2021   AST 18 12/23/2021   ALT 9 12/23/2021   PROT 5.8 (L) 12/23/2021   ALBUMIN 3.1 (L) 12/23/2021   CALCIUM 8.8 (L) 12/23/2021   ANIONGAP 12 12/23/2021   EGFR 44 (L) 10/25/2021   Lab Results  Component Value Date   CHOL 175 12/22/2021   Lab Results  Component Value Date   HDL 68 12/22/2021   Lab Results  Component Value Date   LDLCALC 98 12/22/2021   Lab Results  Component Value Date   TRIG 47 12/22/2021   Lab Results  Component Value Date   CHOLHDL 2.6 12/22/2021   Lab Results  Component Value Date   HGBA1C 5.1 10/25/2021      Assessment & Plan:   Problem List Items Addressed This Visit       Cardiovascular and Mediastinum   Essential hypertension    BP Readings from Last 1 Encounters:  02/07/22 116/60  Usually well-controlled with Amlodipine and Valsartan Counseled for compliance with the medications Advised DASH diet and moderate exercise/walking as tolerated      Carotid disease, bilateral ([place])    Carotid artery US showed - Severe (70-99%) stenosis proximal right internal carotid artery secondary to bulky heterogeneous atherosclerotic plaque. Plaque burden is largely in the carotid bifurcation and extending into the internal and external carotid arteries. On aspirin and statin Referred to neurology She is not deemed to be a good candidate for any aggressive intervention  Digestive   Neuroendocrine carcinoid tumor of stomach    Benign, rising chromogranin Serum serotonin normal CT abdomen recently did not show any new gastric mass.        Endocrine   Acquired hypothyroidism    Lab Results  Component Value Date   TSH 2.880 10/25/2021  On Levothyroxine 25 mcg QD, needs to stay compliant Check TSH and free T4        Nervous and Auditory   Dementia (HCC)    On Donepezil 5 mg QD Has had slight worsening in her cognition recently - MMSE: 23/30 today        Genitourinary   Chronic kidney disease,  stage 3b ([place])    Checked CMP, GFR recently worse likely due to dehydration Avoid nephrotoxic agents Advised to maintain adequate hydration On ARB      Relevant Orders   CBC with Differential/Platelet     Other   Mixed hyperlipidemia    On statin      Syncope and collapse    Unclear etiology Could be due to dehydration and/or carotid artery stenosis  Advised to maintain adequate hydration On aspirin, Plavix and statin for carotid artery stenosis Had referred to neurology      Relevant Orders   CBC with Differential/Platelet   CMP14+EGFR   Encounter for general adult medical examination with abnormal findings - Primary    Physical exam as documented. Fasting labs reviewed. Advised to get Shingrix at local pharmacy.       No orders of the defined types were placed in this encounter.   Follow-up: Return in about 4 months (around 06/08/2022) for HTN.    [name], MD

## 2022-02-07 NOTE — Assessment & Plan Note (Addendum)
BP Readings from Last 1 Encounters:  02/07/22 116/60   Usually well-controlled with Amlodipine and Valsartan Counseled for compliance with the medications Advised DASH diet and moderate exercise/walking as tolerated

## 2022-02-07 NOTE — Assessment & Plan Note (Addendum)
Carotid artery US showed - Severe (70-99%) stenosis proximal right internal carotid artery secondary to bulky heterogeneous atherosclerotic plaque. Plaque burden is largely in the carotid bifurcation and extending into the internal and external carotid arteries. On aspirin and statin Referred to neurology She is not deemed to be a good candidate for any aggressive intervention

## 2022-02-07 NOTE — Assessment & Plan Note (Addendum)
Physical exam as documented. Fasting labs reviewed. Advised to get Shingrix at local pharmacy.

## 2022-02-07 NOTE — Patient Instructions (Signed)
Please continue taking medications as prescribed.  Please continue to follow low salt diet and ambulate as tolerated.  Please take Plavix for only 3 months and then continue only aspirin once daily.

## 2022-02-08 LAB — CMP14+EGFR
ALT: 12 IU/L (ref 0–32)
AST: 25 IU/L (ref 0–40)
Albumin/Globulin Ratio: 1.4 (ref 1.2–2.2)
Albumin: 4.2 g/dL (ref 3.6–4.6)
Alkaline Phosphatase: 126 IU/L — ABNORMAL HIGH (ref 44–121)
BUN/Creatinine Ratio: 11 — ABNORMAL LOW (ref 12–28)
BUN: 18 mg/dL (ref 10–36)
Bilirubin Total: 0.4 mg/dL (ref 0.0–1.2)
CO2: 19 mmol/L — ABNORMAL LOW (ref 20–29)
Calcium: 9.6 mg/dL (ref 8.7–10.3)
Chloride: 105 mmol/L (ref 96–106)
Creatinine, Ser: 1.66 mg/dL — ABNORMAL HIGH (ref 0.57–1.00)
Globulin, Total: 3.1 g/dL (ref 1.5–4.5)
Glucose: 109 mg/dL — ABNORMAL HIGH (ref 70–99)
Potassium: 4.3 mmol/L (ref 3.5–5.2)
Sodium: 141 mmol/L (ref 134–144)
Total Protein: 7.3 g/dL (ref 6.0–8.5)
eGFR: 28 mL/min/{1.73_m2} — ABNORMAL LOW (ref >59)

## 2022-02-08 LAB — CBC WITH DIFFERENTIAL/PLATELET
Basophils Absolute: 0 10*3/uL (ref 0.0–0.2)
Basos: 0 %
EOS (ABSOLUTE): 0 10*3/uL (ref 0.0–0.4)
Eos: 1 %
Hematocrit: 40.8 % (ref 34.0–46.6)
Hemoglobin: 13.7 g/dL (ref 11.1–15.9)
Immature Grans (Abs): 0 10*3/uL (ref 0.0–0.1)
Immature Granulocytes: 0 %
Lymphocytes Absolute: 0.8 10*3/uL (ref 0.7–3.1)
Lymphs: 15 %
MCH: 31.8 pg (ref 26.6–33.0)
MCHC: 33.6 g/dL (ref 31.5–35.7)
MCV: 95 fL (ref 79–97)
Monocytes Absolute: 0.4 10*3/uL (ref 0.1–0.9)
Monocytes: 6 %
Neutrophils Absolute: 4.5 10*3/uL (ref 1.4–7.0)
Neutrophils: 78 %
Platelets: 204 10*3/uL (ref 150–450)
RBC: 4.31 x10E6/uL (ref 3.77–5.28)
RDW: 12.3 % (ref 11.7–15.4)
WBC: 5.7 10*3/uL (ref 3.4–10.8)

## 2022-02-13 ENCOUNTER — Other Ambulatory Visit: Payer: Self-pay | Admitting: Internal Medicine

## 2022-02-13 DIAGNOSIS — F0280 Dementia in other diseases classified elsewhere without behavioral disturbance: Secondary | ICD-10-CM

## 2022-02-18 ENCOUNTER — Emergency Department (HOSPITAL_COMMUNITY)
Admission: EM | Admit: 2022-02-18 | Discharge: 2022-02-18 | Disposition: A | Discharge status: Home / Self Care | Payer: PPO | Attending: Emergency Medicine | Admitting: Emergency Medicine

## 2022-02-18 ENCOUNTER — Encounter (HOSPITAL_COMMUNITY): Payer: Self-pay | Admitting: *Deleted

## 2022-02-18 ENCOUNTER — Other Ambulatory Visit: Payer: Self-pay

## 2022-02-18 DIAGNOSIS — R001 Bradycardia, unspecified: Secondary | ICD-10-CM | POA: Insufficient documentation

## 2022-02-18 DIAGNOSIS — Z7982 Long term (current) use of aspirin: Secondary | ICD-10-CM | POA: Insufficient documentation

## 2022-02-18 DIAGNOSIS — I10 Essential (primary) hypertension: Secondary | ICD-10-CM | POA: Insufficient documentation

## 2022-02-18 DIAGNOSIS — R55 Syncope and collapse: Secondary | ICD-10-CM | POA: Insufficient documentation

## 2022-02-18 DIAGNOSIS — Z79899 Other long term (current) drug therapy: Secondary | ICD-10-CM | POA: Insufficient documentation

## 2022-02-18 DIAGNOSIS — E860 Dehydration: Secondary | ICD-10-CM | POA: Diagnosis not present

## 2022-02-18 LAB — COMPREHENSIVE METABOLIC PANEL
ALT: 12 U/L (ref 0–44)
AST: 24 U/L (ref 15–41)
Albumin: 4 g/dL (ref 3.5–5.0)
Alkaline Phosphatase: 99 U/L (ref 38–126)
Anion gap: 13 (ref 5–15)
BUN: 35 mg/dL — ABNORMAL HIGH (ref 8–23)
CO2: 21 mmol/L — ABNORMAL LOW (ref 22–32)
Calcium: 8.7 mg/dL — ABNORMAL LOW (ref 8.9–10.3)
Chloride: 101 mmol/L (ref 98–111)
Creatinine, Ser: 2.11 mg/dL — ABNORMAL HIGH (ref 0.44–1.00)
GFR, Estimated: 21 mL/min — ABNORMAL LOW (ref >60)
Glucose, Bld: 146 mg/dL — ABNORMAL HIGH (ref 70–99)
Potassium: 3.7 mmol/L (ref 3.5–5.1)
Sodium: 135 mmol/L (ref 135–145)
Total Bilirubin: 0.6 mg/dL (ref 0.3–1.2)
Total Protein: 7.5 g/dL (ref 6.5–8.1)

## 2022-02-18 LAB — CBC WITH DIFFERENTIAL/PLATELET
Abs Immature Granulocytes: 0.05 10*3/uL (ref 0.00–0.07)
Basophils Absolute: 0 10*3/uL (ref 0.0–0.1)
Basophils Relative: 0 %
Eosinophils Absolute: 0 10*3/uL (ref 0.0–0.5)
Eosinophils Relative: 0 %
HCT: 42.3 % (ref 36.0–46.0)
Hemoglobin: 13.8 g/dL (ref 12.0–15.0)
Immature Granulocytes: 0 %
Lymphocytes Relative: 5 %
Lymphs Abs: 0.6 10*3/uL — ABNORMAL LOW (ref 0.7–4.0)
MCH: 32.2 pg (ref 26.0–34.0)
MCHC: 32.6 g/dL (ref 30.0–36.0)
MCV: 98.6 fL (ref 80.0–100.0)
Monocytes Absolute: 0.6 10*3/uL (ref 0.1–1.0)
Monocytes Relative: 5 %
Neutro Abs: 10.8 10*3/uL — ABNORMAL HIGH (ref 1.7–7.7)
Neutrophils Relative %: 90 %
Platelets: 169 10*3/uL (ref 150–400)
RBC: 4.29 MIL/uL (ref 3.87–5.11)
RDW: 12.8 % (ref 11.5–15.5)
WBC: 12.1 10*3/uL — ABNORMAL HIGH (ref 4.0–10.5)
nRBC: 0 % (ref 0.0–0.2)

## 2022-02-18 LAB — TROPONIN I (HIGH SENSITIVITY)
Troponin I (High Sensitivity): 21 ng/L — ABNORMAL HIGH (ref <18)
Troponin I (High Sensitivity): 26 ng/L — ABNORMAL HIGH (ref <18)

## 2022-02-18 MED ORDER — SODIUM CHLORIDE 0.9 % IV BOLUS: 500.0000 mL | Freq: Once | INTRAVENOUS | Status: DC

## 2022-02-18 NOTE — ED Notes (Signed)
Patient was given a cup of water. 

## 2022-02-18 NOTE — ED Notes (Signed)
The patient has had 3 ultrasound IV attempts following 4 IV attempts.

## 2022-02-18 NOTE — ED Triage Notes (Signed)
Pt's son  noted pt slumped over while riding in the car in [place] today.  Pt's son states pt with hx of same. Pt brought here due to pt seen here in the past.  Pt with hx of dehydration

## 2022-02-18 NOTE — ED Provider Notes (Signed)
[facility] Provider Note   CSN: [id number] Arrival date & time: 02/18/22  1826     History  No chief complaint on file.   [name] is a 89 y.o. female.  HPI Patient presents with syncopal episode.  Reportedly was riding in a car with her son to go to [place] and slumped over.  Has had episodes of the same in the past.  Has had dehydration with these episodes in the past.  Patient still not quite herself.  No injury.  I reviewed notes from electrophysiology.  Has had some mild bradycardia but no pauses on monitoring.   Past Medical History:  Diagnosis Date   Adult BMI 19-24 kg/sq m 2008 125 lbs   Anemia    Pernicious 2008-HB 12 MCV 119.5; JAN 2012 HB 13.2 MCV 97.6   Carcinoid tumor of stomach 2008   Diverticulosis    GERD (gastroesophageal reflux disease)    Hiatal hernia    HTN (hypertension)    Hyperlipidemia    Hypertension    Phreesia 02/07/2020   Mild memory loss 02/15/2011   Near syncope 04/18/2014   Pernicious anemia    Pernicious anemia 06/14/2010   Wrist fracture, left 2008    Home Medications Prior to Admission medications   Medication Sig Start Date End Date Taking? Authorizing Provider  acetaminophen (TYLENOL) 500 MG tablet Take 500 mg by mouth every 6 (six) hours as needed. For pain    [provider]  amLODipine (NORVASC) 10 MG tablet Take 1 tablet (10 mg total) by mouth daily. 12/25/21   Lyndal Pulley, MD  aspirin EC 81 MG tablet Take 1 tablet (81 mg total) by mouth daily with breakfast. 06/22/21 06/22/22  Roxan Hockey, MD  clopidogrel (PLAVIX) 75 MG tablet Take 1 tablet (75 mg total) by mouth daily. For 3 months with 81 mg aspirin 12/24/21 03/24/22  Elodia Florence., MD  donepezil (ARICEPT) 5 MG tablet TAKE 1 TABLET BY MOUTH AT BEDTIME 02/13/22   [name], MD  famotidine (PEPCID) 10 MG tablet Take 1 tablet (10 mg total) by mouth daily. 12/23/21 02/06/22  Elodia Florence., MD   levothyroxine (SYNTHROID) 25 MCG tablet TAKE 1 TABLET BY MOUTH ONCE DAILY BEFORE BREAKFAST 11/28/21   [name], MD  rosuvastatin (CRESTOR) 40 MG tablet Take 1 tablet (40 mg total) by mouth daily. 12/26/21   Lyndal Pulley, MD  valsartan (DIOVAN) 40 MG tablet Take 1 tablet (40 mg total) by mouth daily. 10/19/21   [name], MD      Allergies    Codeine    Review of Systems   Review of Systems  Physical Exam Updated Vital Signs BP 128/64   Pulse 85   Temp 97.8 F (36.6 C)   Resp 20   SpO2 99%  Physical Exam Vitals reviewed.  HENT:     Head: Atraumatic.  Cardiovascular:     Rate and Rhythm: Normal rate.  Pulmonary:     Breath sounds: No wheezing.  Abdominal:     Tenderness: There is no abdominal tenderness.  Musculoskeletal:        General: No tenderness.  Neurological:     Comments: Awake and answers questions but may have some confusion.     ED Results / Procedures / Treatments   Labs (all labs ordered are listed, but only abnormal results are displayed) Labs Reviewed  COMPREHENSIVE METABOLIC PANEL - Abnormal; Notable for  the following components:      Result Value   CO2 21 (*)    Glucose, Bld 146 (*)    BUN 35 (*)    Creatinine, Ser 2.11 (*)    Calcium 8.7 (*)    GFR, Estimated 21 (*)    All other components within normal limits  CBC WITH DIFFERENTIAL/PLATELET - Abnormal; Notable for the following components:   WBC 12.1 (*)    Neutro Abs 10.8 (*)    Lymphs Abs 0.6 (*)    All other components within normal limits  TROPONIN I (HIGH SENSITIVITY) - Abnormal; Notable for the following components:   Troponin I (High Sensitivity) 21 (*)    All other components within normal limits  TROPONIN I (HIGH SENSITIVITY) - Abnormal; Notable for the following components:   Troponin I (High Sensitivity) 26 (*)    All other components within normal limits    EKG EKG Interpretation  Date/Time:  Monday February 18 2022 19:29:32 EST Ventricular Rate:  86 PR  Interval:  263 QRS Duration: 84 QT Interval:  398 QTC Calculation: 476 R Axis:   64 Text Interpretation: Sinus rhythm Ventricular premature complex Prolonged PR interval Left atrial enlargement Anteroseptal infarct, old Minimal ST elevation, inferior leads Confirmed by Davonna Belling [phone]) on 02/18/2022 8:10:12 PM  Radiology No results found.  Procedures Procedures    Medications Ordered in ED Medications  sodium chloride 0.9 % bolus 500 mL (500 mLs Intravenous Not Given 02/18/22 2105)    ED Course/ Medical Decision Making/ A&P                             Medical Decision Making Amount and/or Complexity of Data Reviewed Labs: ordered.   Patient with mental status change/syncope.  Has had previous episodes of same dehydration.  Differential diagnosis includes arrhythmia, dehydration, infection.  Will get basic blood work.  Will give small fluid bolus.  Will check EKG.  No trauma. Creatinine has increased.  Recently increased from physicians check also.  Vitals reassuring here however.  Troponin mildly elevated but appears near baseline.  Reviewed cardiology note.  Discussed with patient's son and patient.  She does not want to come in the hospital.  I think this is reasonable at 89 years old.  Offered admission for dehydration IV fluids but will try oral hydration at home first       Final Clinical Impression(s) / ED Diagnoses Final diagnoses:  Syncope, unspecified syncope type  Dehydration    Rx / DC Orders ED Discharge Orders     None         Davonna Belling, MD 02/18/22 2321

## 2022-02-22 ENCOUNTER — Telehealth: Payer: Self-pay

## 2022-02-22 NOTE — Telephone Encounter (Signed)
        Patient  visited [facility] on 02/18/2022  for syncope.   Telephone encounter attempt :  1st  A HIPAA compliant voice message was left requesting a return call.  Instructed patient to call back at [phone].   [facility] Resource Care Guide   [email or web address]  ?? [phone]   Website: [email or web address]  [email or web address]

## 2022-02-24 ENCOUNTER — Encounter (HOSPITAL_COMMUNITY): Payer: Self-pay

## 2022-02-24 ENCOUNTER — Other Ambulatory Visit: Payer: Self-pay

## 2022-02-24 ENCOUNTER — Inpatient Hospital Stay (HOSPITAL_COMMUNITY)
Admission: EM | Admit: 2022-02-24 | Discharge: 2022-03-01 | DRG: 683 | Disposition: A | Discharge status: Hospice | Payer: PPO | Attending: Family Medicine | Admitting: Family Medicine

## 2022-02-24 ENCOUNTER — Observation Stay (HOSPITAL_COMMUNITY): Payer: PPO

## 2022-02-24 DIAGNOSIS — Z8249 Family history of ischemic heart disease and other diseases of the circulatory system: Secondary | ICD-10-CM | POA: Diagnosis not present

## 2022-02-24 DIAGNOSIS — E872 Acidosis, unspecified: Secondary | ICD-10-CM | POA: Diagnosis not present

## 2022-02-24 DIAGNOSIS — E871 Hypo-osmolality and hyponatremia: Secondary | ICD-10-CM | POA: Diagnosis present

## 2022-02-24 DIAGNOSIS — Z1152 Encounter for screening for COVID-19: Secondary | ICD-10-CM

## 2022-02-24 DIAGNOSIS — F0390 Unspecified dementia without behavioral disturbance: Secondary | ICD-10-CM | POA: Diagnosis present

## 2022-02-24 DIAGNOSIS — E860 Dehydration: Secondary | ICD-10-CM | POA: Diagnosis not present

## 2022-02-24 DIAGNOSIS — I129 Hypertensive chronic kidney disease with stage 1 through stage 4 chronic kidney disease, or unspecified chronic kidney disease: Secondary | ICD-10-CM | POA: Diagnosis present

## 2022-02-24 DIAGNOSIS — Z66 Do not resuscitate: Secondary | ICD-10-CM | POA: Diagnosis not present

## 2022-02-24 DIAGNOSIS — E861 Hypovolemia: Secondary | ICD-10-CM | POA: Diagnosis not present

## 2022-02-24 DIAGNOSIS — K449 Diaphragmatic hernia without obstruction or gangrene: Secondary | ICD-10-CM | POA: Diagnosis not present

## 2022-02-24 DIAGNOSIS — Z885 Allergy status to narcotic agent status: Secondary | ICD-10-CM

## 2022-02-24 DIAGNOSIS — K219 Gastro-esophageal reflux disease without esophagitis: Secondary | ICD-10-CM | POA: Diagnosis present

## 2022-02-24 DIAGNOSIS — N1832 Chronic kidney disease, stage 3b: Secondary | ICD-10-CM | POA: Diagnosis present

## 2022-02-24 DIAGNOSIS — R64 Cachexia: Secondary | ICD-10-CM | POA: Diagnosis present

## 2022-02-24 DIAGNOSIS — R627 Adult failure to thrive: Secondary | ICD-10-CM | POA: Diagnosis present

## 2022-02-24 DIAGNOSIS — N179 Acute kidney failure, unspecified: Secondary | ICD-10-CM | POA: Diagnosis not present

## 2022-02-24 DIAGNOSIS — I6529 Occlusion and stenosis of unspecified carotid artery: Secondary | ICD-10-CM | POA: Diagnosis not present

## 2022-02-24 DIAGNOSIS — I10 Essential (primary) hypertension: Secondary | ICD-10-CM | POA: Diagnosis not present

## 2022-02-24 DIAGNOSIS — Z9181 History of falling: Secondary | ICD-10-CM | POA: Diagnosis not present

## 2022-02-24 DIAGNOSIS — Z7982 Long term (current) use of aspirin: Secondary | ICD-10-CM

## 2022-02-24 DIAGNOSIS — E878 Other disorders of electrolyte and fluid balance, not elsewhere classified: Secondary | ICD-10-CM | POA: Diagnosis present

## 2022-02-24 DIAGNOSIS — E876 Hypokalemia: Secondary | ICD-10-CM | POA: Diagnosis not present

## 2022-02-24 DIAGNOSIS — E785 Hyperlipidemia, unspecified: Secondary | ICD-10-CM | POA: Diagnosis not present

## 2022-02-24 DIAGNOSIS — Z79899 Other long term (current) drug therapy: Secondary | ICD-10-CM | POA: Diagnosis not present

## 2022-02-24 DIAGNOSIS — Z681 Body mass index (BMI) 19 or less, adult: Secondary | ICD-10-CM | POA: Diagnosis not present

## 2022-02-24 DIAGNOSIS — Z7902 Long term (current) use of antithrombotics/antiplatelets: Secondary | ICD-10-CM | POA: Diagnosis not present

## 2022-02-24 DIAGNOSIS — E039 Hypothyroidism, unspecified: Secondary | ICD-10-CM | POA: Diagnosis not present

## 2022-02-24 DIAGNOSIS — Z515 Encounter for palliative care: Secondary | ICD-10-CM | POA: Diagnosis not present

## 2022-02-24 DIAGNOSIS — Z7189 Other specified counseling: Secondary | ICD-10-CM | POA: Diagnosis not present

## 2022-02-24 DIAGNOSIS — J439 Emphysema, unspecified: Secondary | ICD-10-CM | POA: Diagnosis not present

## 2022-02-24 LAB — PHOSPHORUS: Phosphorus: 6.3 mg/dL — ABNORMAL HIGH (ref 2.5–4.6)

## 2022-02-24 LAB — RESP PANEL BY RT-PCR (RSV, FLU A&B, COVID)  RVPGX2
Influenza A by PCR: NEGATIVE
Influenza B by PCR: NEGATIVE
Resp Syncytial Virus by PCR: NEGATIVE
SARS Coronavirus 2 by RT PCR: NEGATIVE

## 2022-02-24 LAB — CBC WITH DIFFERENTIAL/PLATELET
Abs Immature Granulocytes: 0.03 10*3/uL (ref 0.00–0.07)
Basophils Absolute: 0 10*3/uL (ref 0.0–0.1)
Basophils Relative: 0 %
Eosinophils Absolute: 0 10*3/uL (ref 0.0–0.5)
Eosinophils Relative: 0 %
HCT: 44.3 % (ref 36.0–46.0)
Hemoglobin: 14.9 g/dL (ref 12.0–15.0)
Immature Granulocytes: 0 %
Lymphocytes Relative: 5 %
Lymphs Abs: 0.5 10*3/uL — ABNORMAL LOW (ref 0.7–4.0)
MCH: 31.8 pg (ref 26.0–34.0)
MCHC: 33.6 g/dL (ref 30.0–36.0)
MCV: 94.7 fL (ref 80.0–100.0)
Monocytes Absolute: 0.8 10*3/uL (ref 0.1–1.0)
Monocytes Relative: 9 %
Neutro Abs: 8.1 10*3/uL — ABNORMAL HIGH (ref 1.7–7.7)
Neutrophils Relative %: 86 %
Platelets: 180 10*3/uL (ref 150–400)
RBC: 4.68 MIL/uL (ref 3.87–5.11)
RDW: 12.3 % (ref 11.5–15.5)
WBC: 9.5 10*3/uL (ref 4.0–10.5)
nRBC: 0 % (ref 0.0–0.2)

## 2022-02-24 LAB — COMPREHENSIVE METABOLIC PANEL
ALT: 21 U/L (ref 0–44)
AST: 53 U/L — ABNORMAL HIGH (ref 15–41)
Albumin: 4.1 g/dL (ref 3.5–5.0)
Alkaline Phosphatase: 102 U/L (ref 38–126)
Anion gap: 20 — ABNORMAL HIGH (ref 5–15)
BUN: 81 mg/dL — ABNORMAL HIGH (ref 8–23)
CO2: 20 mmol/L — ABNORMAL LOW (ref 22–32)
Calcium: 9 mg/dL (ref 8.9–10.3)
Chloride: 92 mmol/L — ABNORMAL LOW (ref 98–111)
Creatinine, Ser: 4.34 mg/dL — ABNORMAL HIGH (ref 0.44–1.00)
GFR, Estimated: 9 mL/min — ABNORMAL LOW (ref >60)
Glucose, Bld: 125 mg/dL — ABNORMAL HIGH (ref 70–99)
Potassium: 2.9 mmol/L — ABNORMAL LOW (ref 3.5–5.1)
Sodium: 132 mmol/L — ABNORMAL LOW (ref 135–145)
Total Bilirubin: 1.1 mg/dL (ref 0.3–1.2)
Total Protein: 8.5 g/dL — ABNORMAL HIGH (ref 6.5–8.1)

## 2022-02-24 LAB — MAGNESIUM: Magnesium: 2.4 mg/dL (ref 1.7–2.4)

## 2022-02-24 LAB — TSH: TSH: 1.466 u[IU]/mL (ref 0.350–4.500)

## 2022-02-24 MED ORDER — ONDANSETRON HCL 4 MG PO TABS: 4.0000 mg | ORAL_TABLET | Freq: Four times a day (QID) | ORAL | Status: DC | PRN

## 2022-02-24 MED ORDER — LEVOTHYROXINE SODIUM 25 MCG PO TABS
25.0000 ug | ORAL_TABLET | Freq: Every day | ORAL | Status: DC
  Administered 2022-02-25 – 2022-03-01 (×5): 25 ug via ORAL
  Filled 2022-02-24 (×5): qty 1

## 2022-02-24 MED ORDER — LACTATED RINGERS IV BOLUS
1000.0000 mL | Freq: Once | INTRAVENOUS | Status: AC
  Administered 2022-02-24: 1000 mL via INTRAVENOUS

## 2022-02-24 MED ORDER — POTASSIUM CHLORIDE 20 MEQ PO PACK
40.0000 meq | PACK | Freq: Once | ORAL | Status: AC
  Administered 2022-02-24: 40 meq via ORAL
  Filled 2022-02-24: qty 2

## 2022-02-24 MED ORDER — POTASSIUM CHLORIDE 10 MEQ/100ML IV SOLN
10.0000 meq | INTRAVENOUS | Status: AC
  Administered 2022-02-24 (×2): 10 meq via INTRAVENOUS
  Filled 2022-02-24 (×2): qty 100

## 2022-02-24 MED ORDER — MAGNESIUM SULFATE IN D5W 1-5 GM/100ML-% IV SOLN
1.0000 g | Freq: Once | INTRAVENOUS | Status: AC
  Administered 2022-02-24: 1 g via INTRAVENOUS
  Filled 2022-02-24: qty 100

## 2022-02-24 MED ORDER — AMLODIPINE BESYLATE 5 MG PO TABS
10.0000 mg | ORAL_TABLET | Freq: Every day | ORAL | Status: DC
  Administered 2022-02-25 – 2022-03-01 (×5): 10 mg via ORAL
  Filled 2022-02-24 (×5): qty 2

## 2022-02-24 MED ORDER — ACETAMINOPHEN 650 MG RE SUPP: 650.0000 mg | Freq: Four times a day (QID) | RECTAL | Status: DC | PRN

## 2022-02-24 MED ORDER — SODIUM CHLORIDE 0.9 % IV SOLN: INTRAVENOUS | Status: AC

## 2022-02-24 MED ORDER — POLYETHYLENE GLYCOL 3350 17 G PO PACK: 17.0000 g | PACK | Freq: Every day | ORAL | Status: DC | PRN

## 2022-02-24 MED ORDER — LACTATED RINGERS IV SOLN: INTRAVENOUS | Status: DC

## 2022-02-24 MED ORDER — ASPIRIN 81 MG PO TBEC
81.0000 mg | DELAYED_RELEASE_TABLET | Freq: Every day | ORAL | Status: DC
  Administered 2022-02-25 – 2022-03-01 (×5): 81 mg via ORAL
  Filled 2022-02-24 (×5): qty 1

## 2022-02-24 MED ORDER — ONDANSETRON HCL 4 MG/2ML IJ SOLN: 4.0000 mg | Freq: Four times a day (QID) | INTRAMUSCULAR | Status: DC | PRN

## 2022-02-24 MED ORDER — HEPARIN SODIUM (PORCINE) 5000 UNIT/ML IJ SOLN
5000.0000 [IU] | Freq: Three times a day (TID) | INTRAMUSCULAR | Status: DC
  Administered 2022-02-24 – 2022-03-01 (×12): 5000 [IU] via SUBCUTANEOUS
  Filled 2022-02-24 (×12): qty 1

## 2022-02-24 MED ORDER — ACETAMINOPHEN 325 MG PO TABS: 650.0000 mg | ORAL_TABLET | Freq: Four times a day (QID) | ORAL | Status: DC | PRN

## 2022-02-24 MED ORDER — CLOPIDOGREL BISULFATE 75 MG PO TABS
75.0000 mg | ORAL_TABLET | Freq: Every day | ORAL | Status: DC
  Administered 2022-02-25 – 2022-03-01 (×5): 75 mg via ORAL
  Filled 2022-02-24 (×5): qty 1

## 2022-02-24 NOTE — ED Provider Notes (Signed)
[facility] Provider Note  CSN: [id number] Arrival date & time: 02/24/22 1539  Chief Complaint(s) Failure To Thrive  HPI [name] is a 89 y.o. female with PMH HTN, HLD, pernicious anemia, carcinoid tumor of the stomach, CKD 3, dementia who presents emergency department for evaluation of failure to thrive.  Patient was recently seen in the emergency department on 02/18/2022 and found to have likely hypovolemic AKI but show decision-making was had and as patient improved with IV fluids from a functional standpoint family ultimately decided to take her home and forego hospital admission.  Since leaving the emergency department, patient has had a functional status declined and she is no longer eating or drinking.  History obtained from patient's son who denies patient vomiting, diarrhea, chest pain or other systemic symptoms.   Past Medical History Past Medical History:  Diagnosis Date   Adult BMI 19-24 kg/sq m 2008 125 lbs   Anemia    Pernicious 2008-HB 12 MCV 119.5; JAN 2012 HB 13.2 MCV 97.6   Carcinoid tumor of stomach 2008   Diverticulosis    GERD (gastroesophageal reflux disease)    Hiatal hernia    HTN (hypertension)    Hyperlipidemia    Hypertension    Phreesia 02/07/2020   Mild memory loss 02/15/2011   Near syncope 04/18/2014   Pernicious anemia    Pernicious anemia 06/14/2010   Wrist fracture, left 2008   Patient Active Problem List   Diagnosis Date Noted   Altered mental status 12/23/2021   Chronic kidney disease, stage 3b ([place]) 12/20/2021   Neck pain 10/19/2021   Hospital discharge follow-up 06/28/2021   Fall 06/20/2021   Stage 3a chronic kidney disease ([place]) 02/05/2021   Encounter for general adult medical examination with abnormal findings 02/05/2021   Dementia ([place]) 04/06/2020   Acquired hypothyroidism 02/10/2020   Renal mass 02/10/2020   Pulmonary nodule 02/10/2020   Iron deficiency anemia 01/26/2020   Carotid  disease, bilateral ([place]) 04/19/2014   Syncope and collapse 04/18/2014   Pernicious anemia 06/14/2010   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 Authorizing Provider  acetaminophen (TYLENOL) 500 MG tablet Take 500 mg by mouth every 6 (six) hours as needed. For pain    [provider]  amLODipine (NORVASC) 10 MG tablet Take 1 tablet (10 mg total) by mouth daily. 12/25/21   Lyndal Pulley, MD  aspirin EC 81 MG tablet Take 1 tablet (81 mg total) by mouth daily with breakfast. 06/22/21 06/22/22  Roxan Hockey, MD  clopidogrel (PLAVIX) 75 MG tablet Take 1 tablet (75 mg total) by mouth daily. For 3 months with 81 mg aspirin 12/24/21 03/24/22  Elodia Florence., MD  donepezil (ARICEPT) 5 MG tablet TAKE 1 TABLET BY MOUTH AT BEDTIME 02/13/22   [name], MD  famotidine (PEPCID) 10 MG tablet Take 1 tablet (10 mg total) by mouth daily. 12/23/21 02/06/22  Elodia Florence., MD  levothyroxine (SYNTHROID) 25 MCG tablet TAKE 1 TABLET BY MOUTH ONCE DAILY BEFORE BREAKFAST 11/28/21   [name], MD  rosuvastatin (CRESTOR) 40 MG tablet Take 1 tablet (40 mg total) by mouth daily. 12/26/21   Lyndal Pulley, MD  valsartan (DIOVAN) 40 MG tablet Take 1 tablet (40 mg total) by mouth daily. 10/19/21  [name], MD                                                                                                                                    Past Surgical History Past Surgical History:  Procedure Laterality Date   COLONOSCOPY  2003 LS   DC/[place] Diverticulosis   EUS  2008 DJ   FNA NO CARCINOID   EUS  JAN 2009   NL EXAM, NO Bx   EUS  [facility] 2011 WO   CARCINOID   UPPER GASTROINTESTINAL ENDOSCOPY  APR 2008 SLF   CARCIONOID   UPPER GASTROINTESTINAL ENDOSCOPY  FEB 2011 SLF   CARCINOID/mild  anemia/large hiatal hernia   Family History Family History  Problem Relation Age of Onset   Cancer Mother    Heart disease Brother    Kidney disease Son    Colon cancer Neg Hx    Colon polyps Neg Hx     Social History Social History   Tobacco Use   Smoking status: Never   Smokeless tobacco: Never  Vaping Use   Vaping Use: Never used  Substance Use Topics   Alcohol use: No   Drug use: No   Allergies Codeine  Review of Systems Review of Systems  Constitutional:  Positive for appetite change and fatigue.    Physical Exam Vital Signs  I have reviewed the triage vital signs BP 121/66 (BP Location: Right Arm)   Pulse 90   Temp 98.1 F (36.7 C) (Oral)   Resp 18   Ht '5\' 1"'$  (1.549 m)   Wt 43.5 kg   SpO2 100%   BMI 18.14 kg/m   Physical Exam Vitals and nursing note reviewed.  Constitutional:      General: She is not in acute distress.    Appearance: She is well-developed.  HENT:     Head: Normocephalic and atraumatic.     Comments: Cachectic, dry tacky mucous membranes Eyes:     Conjunctiva/sclera: Conjunctivae normal.  Cardiovascular:     Rate and Rhythm: Normal rate and regular rhythm.     Heart sounds: No murmur heard. Pulmonary:     Effort: Pulmonary effort is normal. No respiratory distress.     Breath sounds: Normal breath sounds.  Abdominal:     Palpations: Abdomen is soft.     Tenderness: There is no abdominal tenderness.  Musculoskeletal:        General: No swelling.     Cervical back: Neck supple.  Skin:    General: Skin is warm and dry.     Capillary Refill: Capillary refill takes less than 2 seconds.  Neurological:     Mental Status: She is alert.  Psychiatric:        Mood and Affect: Mood normal.     ED Results and Treatments Labs (all labs ordered are listed, but only abnormal results are displayed) Labs Reviewed  RESP PANEL BY RT-PCR (  RSV, FLU A&B, COVID)  RVPGX2  COMPREHENSIVE METABOLIC PANEL  CBC WITH DIFFERENTIAL/PLATELET   URINALYSIS, ROUTINE W REFLEX MICROSCOPIC  TSH                                                                                                                          Radiology No results found.  Pertinent labs & imaging results that were available during my care of the patient were reviewed by me and considered in my medical decision making (see MDM for details).  Medications Ordered in ED Medications - No data to display                                                                                                                                   Procedures .Critical Care  Performed by: Teressa Lower, MD Authorized by: Teressa Lower, MD   Critical care provider statement:    Critical care time (minutes):  30   Critical care was necessary to treat or prevent imminent or life-threatening deterioration of the following conditions:  Dehydration and renal failure   Critical care was time spent personally by me on the following activities:  Development of treatment plan with patient or surrogate, discussions with consultants, evaluation of patient's response to treatment, examination of patient, ordering and review of laboratory studies, ordering and review of radiographic studies, ordering and performing treatments and interventions, pulse oximetry, re-evaluation of patient's condition and review of old charts   (including critical care time)  Medical Decision Making / ED Course   This patient presents to the ED for concern of failure to thrive, this involves an extensive number of treatment options, and is a complaint that carries with it a high risk of complications and morbidity.  The differential diagnosis includes progression of underlying dementia, prerenal azotemia, medical renal disease, obstructive uropathy, electrolyte abnormality, bacteremia  MDM: Patient seen emerged part for evaluation of failure to thrive.  Physical exam with a cachectic patient with dry tacky mucous  membranes but is otherwise unremarkable.  Her evaluation is concerning with a significant AKI with BUN 81, creatinine 4.34 which has doubled from her AKI seen 6 days ago.  Patient also with hyponatremia, hypokalemia, hypochloremia all likely secondary to decreased p.o. intake.  Bicarb is 20 with anion gap of 20.  I had a discussion with the patients on who states that he understands that the patient is likely headed  towards a functional decline at 89 years of age and likely would pursue home health with hospice but would currently like patient to be medically admitted, rehydrated to speak with case management team and coordinate home resources.  Patient then admitted for AKI on CKD and failure to thrive.   Additional history obtained: -Additional history obtained from son -External records from outside source obtained and reviewed including: Chart review including previous notes, labs, imaging, consultation notes   Lab Tests: -I ordered, reviewed, and interpreted labs.   The pertinent results include:   Labs Reviewed  RESP PANEL BY RT-PCR (RSV, FLU A&B, COVID)  RVPGX2  COMPREHENSIVE METABOLIC PANEL  CBC WITH DIFFERENTIAL/PLATELET  URINALYSIS, ROUTINE W REFLEX MICROSCOPIC  TSH       Medicines ordered and prescription drug management: No orders of the defined types were placed in this encounter.   -I have reviewed the patients home medicines and have made adjustments as needed  Critical interventions Fluid resuscitation    Cardiac Monitoring: The patient was maintained on a cardiac monitor.  I personally viewed and interpreted the cardiac monitored which showed an underlying rhythm of: NSR  Social Determinants of Health:  Factors impacting patients care include: Currently lives at home, cared for by son and family   Reevaluation: After the interventions noted above, I reevaluated the patient and found that they have :improved  Co morbidities that complicate the patient  evaluation  Past Medical History:  Diagnosis Date   Adult BMI 19-24 kg/sq m 2008 125 lbs   Anemia    Pernicious 2008-HB 12 MCV 119.5; JAN 2012 HB 13.2 MCV 97.6   Carcinoid tumor of stomach 2008   Diverticulosis    GERD (gastroesophageal reflux disease)    Hiatal hernia    HTN (hypertension)    Hyperlipidemia    Hypertension    Phreesia 02/07/2020   Mild memory loss 02/15/2011   Near syncope 04/18/2014   Pernicious anemia    Pernicious anemia 06/14/2010   Wrist fracture, left 2008      Dispostion: I considered admission for this patient, and due to AKI on CKD with failure to thrive patient require hospital admission     Final Clinical Impression(s) / ED Diagnoses Final diagnoses:  None     '@PCDICTATION'$ @    Teressa Lower, MD 02/24/22 1721

## 2022-02-24 NOTE — Assessment & Plan Note (Addendum)
Presenting with weakness, falls, poor oral intake, resultant AKI and electrolyte abnormalities.  Advanced age, patient appears frail.  TSH WNL- 1.4. -Palliative care consult -Obtain chest x-ray -Follow-up UA

## 2022-02-24 NOTE — H&P (Signed)
History and Physical    [name] LEX:[id number] DOB: December 07, 1922 DOA: 02/24/2022  PCP: [name], MD   Patient coming from: Home  I have personally briefly reviewed patient's old medical records in [place]  Chief Complaint: Poor Appetite  HPI: [name] is a 89 y.o. female with medical history significant for CKD 3, falls, hypertension. Patient was brought to the ED reports of reduced oral intake, and not doing her normal activities.  On my evaluation, patient is alone: Patient is able to answer questions appropriately.  She tells me she has been weak and falling, with poor appetite.  Denies pain.  No cough, no difficulty breathing.  Patient was in the ED 1/29-5 syncopal episode, patient was riding in a car with her son when she slumped over.  Patient was thought to be dehydrated, troponins mildly elevated.  Patient did not want to be admitted, hence patient was discharged home.  ED Course: Temperature 98.1.  Heart rate 83-90.  Blood pressure 113-121. Cr elevated at 4.34, last checked during ED visit 1/29 - Cr- 2.1. baseline- 1.2-1.6.  Potassium 2.9.  Magnesium 2.4.  TSH normal 1.46. 2 L bolus given. Hospitalist to admit for failure to thrive  Review of Systems: As per HPI all other systems reviewed and negative.  Past Medical History:  Diagnosis Date   Adult BMI 19-24 kg/sq m 2008 125 lbs   Anemia    Pernicious 2008-HB 12 MCV 119.5; JAN 2012 HB 13.2 MCV 97.6   Carcinoid tumor of stomach 2008   Diverticulosis    GERD (gastroesophageal reflux disease)    Hiatal hernia    HTN (hypertension)    Hyperlipidemia    Hypertension    Phreesia 02/07/2020   Mild memory loss 02/15/2011   Near syncope 04/18/2014   Pernicious anemia    Pernicious anemia 06/14/2010   Wrist fracture, left 2008    Past Surgical History:  Procedure Laterality Date   COLONOSCOPY  2003 LS   DC/[place] Diverticulosis   EUS  2008 DJ   FNA NO CARCINOID   EUS  JAN 2009   NL EXAM, NO Bx    EUS  [facility] 2011 WO   CARCINOID   UPPER GASTROINTESTINAL ENDOSCOPY  APR 2008 SLF   CARCIONOID   UPPER GASTROINTESTINAL ENDOSCOPY  FEB 2011 SLF   CARCINOID/mild anemia/large hiatal hernia     reports that she has never smoked. She has never used smokeless tobacco. She reports that she does not drink alcohol and does not use drugs.  Allergies  Allergen Reactions   Codeine     Family History  Problem Relation Age of Onset   Cancer Mother    Heart disease Brother    Kidney disease Son    Colon cancer Neg Hx    Colon polyps Neg Hx    Prior to Admission medications   Medication Sig Start Date End Date Taking? Authorizing Provider  acetaminophen (TYLENOL) 500 MG tablet Take 500 mg by mouth every 6 (six) hours as needed. For pain    [provider]  amLODipine (NORVASC) 10 MG tablet Take 1 tablet (10 mg total) by mouth daily. 12/25/21   Lyndal Pulley, MD  aspirin EC 81 MG tablet Take 1 tablet (81 mg total) by mouth daily with breakfast. 06/22/21 06/22/22  Roxan Hockey, MD  clopidogrel (PLAVIX) 75 MG tablet Take 1 tablet (75 mg total) by mouth daily. For 3 months with 81 mg aspirin 12/24/21 03/24/22  Hulen Luster  Clint Lipps., MD  donepezil (ARICEPT) 5 MG tablet TAKE 1 TABLET BY MOUTH AT BEDTIME 02/13/22   [name], MD  famotidine (PEPCID) 10 MG tablet Take 1 tablet (10 mg total) by mouth daily. 12/23/21 02/06/22  Elodia Florence., MD  levothyroxine (SYNTHROID) 25 MCG tablet TAKE 1 TABLET BY MOUTH ONCE DAILY BEFORE BREAKFAST 11/28/21   [name], MD  rosuvastatin (CRESTOR) 40 MG tablet Take 1 tablet (40 mg total) by mouth daily. 12/26/21   Lyndal Pulley, MD  valsartan (DIOVAN) 40 MG tablet Take 1 tablet (40 mg total) by mouth daily. 10/19/21   [name], MD    Physical Exam: Vitals:   02/24/22 1546 02/24/22 1553 02/24/22 2034  BP:  121/66 (!) 113/50  Pulse:  90 83  Resp:  18 18  Temp:  98.1 F (36.7 C) (!) 97.4 F (36.3 C)  TempSrc:  Oral Oral  SpO2:  100% 97%   Weight: 43.5 kg    Height: '5\' 1"'$  (1.549 m)      Constitutional: Frail-appearing, thin, Vitals:   02/24/22 1546 02/24/22 1553 02/24/22 2034  BP:  121/66 (!) 113/50  Pulse:  90 83  Resp:  18 18  Temp:  98.1 F (36.7 C) (!) 97.4 F (36.3 C)  TempSrc:  Oral Oral  SpO2:  100% 97%  Weight: 43.5 kg    Height: '5\' 1"'$  (1.549 m)     Eyes: PERRL, lids and conjunctivae normal ENMT: Mucous membranes are dry, Appears dehydrated Neck: normal, supple, no masses, no thyromegaly Respiratory: clear to auscultation bilaterally, no wheezing, no crackles. Normal respiratory effort. No accessory muscle use.  Cardiovascular: Regular rate and rhythm, no murmurs / rubs / gallops.  Extremities warm. Abdomen: no tenderness, no masses palpated. No hepatosplenomegaly. Bowel sounds positive.  Musculoskeletal: no clubbing / cyanosis. No joint deformity upper and lower extremities. Skin: no rashes, lesions, ulcers. No induration Neurologic: Moving extremities spontaneously, no facial symmetry, speech clear without evidence of aphasia. Psychiatric:  Awake, alert and oriented to person and place and situation.  Occasionally repeats questions.  Labs on Admission: I have personally reviewed following labs and imaging studies  CBC: Recent Labs  Lab 02/18/22 2016 02/24/22 1614  WBC 12.1* 9.5  NEUTROABS 10.8* 8.1*  HGB 13.8 14.9  HCT 42.3 44.3  MCV 98.6 94.7  PLT 169 790   Basic Metabolic Panel: Recent Labs  Lab 02/18/22 2016 02/24/22 1614 02/24/22 1720  NA 135 132*  --   K 3.7 2.9*  --   CL 101 92*  --   CO2 21* 20*  --   GLUCOSE 146* 125*  --   BUN 35* 81*  --   CREATININE 2.11* 4.34*  --   CALCIUM 8.7* 9.0  --   MG  --   --  2.4  PHOS  --   --  6.3*   GFR: Estimated Creatinine Clearance: 5 mL/min (A) (by C-G formula based on SCr of 4.34 mg/dL (H)). Liver Function Tests: Recent Labs  Lab 02/18/22 2016 02/24/22 1614  AST 24 53*  ALT 12 21  ALKPHOS 99 102  BILITOT 0.6 1.1  PROT 7.5  8.5*  ALBUMIN 4.0 4.1   Thyroid Function Tests: Recent Labs    02/24/22 1614  TSH 1.466   Radiological Exams on Admission: No results found.  EKG: None  Assessment/Plan Principal Problem:   AKI (acute kidney injury) ([place]) Active Problems:   FTT (failure to thrive) in adult  Essential hypertension   Chronic kidney disease, stage 3b ([facility])   Electrolyte abnormality    Assessment and Plan: * AKI (acute kidney injury) ([place]) AKI on CKD stage IIIb.  Cr elevated at 4.34, last checked during ED visit 1/29 - Cr- 2.1. Baseline ~ 1.2-1. 6.  Likely due to poor oral intake. -2 Liter bolus given, continue N/s 75cc/hr x 1 day  FTT (failure to thrive) in adult Presenting with weakness, falls, poor oral intake, resultant AKI and electrolyte abnormalities.  Advanced age, patient appears frail.  TSH WNL- 1.4. -Palliative care consult -Obtain chest x-ray -Follow-up UA  Electrolyte abnormality Hyponatremia sodium 133, hypokalemia 2.9.  Possibly anion gap metabolic acidosis anion OIT-25, serum bicarb of 20.  Likely due to AKI.  Magnesium normal 2.4.  Phosphorus 6.3. - replete lytes.   Essential hypertension Stable. - Hold Valsartan with AKI -Resume Norvasc   DVT prophylaxis: heparin Code Status: FULL Code-confirmed with patient and family Family Communication: None at bedside. Son [name] is Tax adviser. Disposition Plan: ~ 2 days Consults called: PAlliative Admission status:  Obs tele   Author: Bethena Roys, MD 02/24/2022 9:25 PM  For on call review [email or web address].

## 2022-02-24 NOTE — ED Triage Notes (Signed)
Pt son reports she is not wanting to eat or drink anything and has stopped doing her normal activities.

## 2022-02-24 NOTE — Assessment & Plan Note (Signed)
Stable. - Hold Valsartan with AKI -Resume Norvasc

## 2022-02-24 NOTE — Assessment & Plan Note (Addendum)
AKI on CKD stage IIIb.  Cr elevated at 4.34, last checked during ED visit 1/29 - Cr- 2.1. Baseline ~ 1.2-1. 6.  Likely due to poor oral intake, with valsartan use -2 Liter bolus given, continue N/s 75cc/hr x 1 day -Hold valsartan

## 2022-02-24 NOTE — Assessment & Plan Note (Signed)
Hyponatremia sodium 133, hypokalemia 2.9.  Possibly anion gap metabolic acidosis anion PDP-24, serum bicarb of 20.  Likely due to AKI.  Magnesium normal 2.4.  Phosphorus 6.3. - replete lytes.

## 2022-02-25 ENCOUNTER — Encounter (HOSPITAL_COMMUNITY): Payer: Self-pay | Admitting: Internal Medicine

## 2022-02-25 DIAGNOSIS — Z515 Encounter for palliative care: Secondary | ICD-10-CM | POA: Diagnosis not present

## 2022-02-25 DIAGNOSIS — R627 Adult failure to thrive: Secondary | ICD-10-CM | POA: Diagnosis present

## 2022-02-25 DIAGNOSIS — Z9181 History of falling: Secondary | ICD-10-CM | POA: Diagnosis not present

## 2022-02-25 DIAGNOSIS — Z7189 Other specified counseling: Secondary | ICD-10-CM | POA: Diagnosis not present

## 2022-02-25 DIAGNOSIS — Z8249 Family history of ischemic heart disease and other diseases of the circulatory system: Secondary | ICD-10-CM | POA: Diagnosis not present

## 2022-02-25 DIAGNOSIS — R64 Cachexia: Secondary | ICD-10-CM | POA: Diagnosis present

## 2022-02-25 DIAGNOSIS — E861 Hypovolemia: Secondary | ICD-10-CM | POA: Diagnosis present

## 2022-02-25 DIAGNOSIS — E876 Hypokalemia: Secondary | ICD-10-CM | POA: Diagnosis present

## 2022-02-25 DIAGNOSIS — Z7902 Long term (current) use of antithrombotics/antiplatelets: Secondary | ICD-10-CM | POA: Diagnosis not present

## 2022-02-25 DIAGNOSIS — E878 Other disorders of electrolyte and fluid balance, not elsewhere classified: Secondary | ICD-10-CM | POA: Diagnosis present

## 2022-02-25 DIAGNOSIS — I10 Essential (primary) hypertension: Secondary | ICD-10-CM | POA: Diagnosis not present

## 2022-02-25 DIAGNOSIS — N179 Acute kidney failure, unspecified: Secondary | ICD-10-CM | POA: Diagnosis present

## 2022-02-25 DIAGNOSIS — Z1152 Encounter for screening for COVID-19: Secondary | ICD-10-CM | POA: Diagnosis not present

## 2022-02-25 DIAGNOSIS — Z79899 Other long term (current) drug therapy: Secondary | ICD-10-CM | POA: Diagnosis not present

## 2022-02-25 DIAGNOSIS — E860 Dehydration: Secondary | ICD-10-CM

## 2022-02-25 DIAGNOSIS — E871 Hypo-osmolality and hyponatremia: Secondary | ICD-10-CM | POA: Diagnosis present

## 2022-02-25 DIAGNOSIS — F0390 Unspecified dementia without behavioral disturbance: Secondary | ICD-10-CM | POA: Diagnosis present

## 2022-02-25 DIAGNOSIS — E039 Hypothyroidism, unspecified: Secondary | ICD-10-CM | POA: Diagnosis present

## 2022-02-25 DIAGNOSIS — K219 Gastro-esophageal reflux disease without esophagitis: Secondary | ICD-10-CM | POA: Diagnosis present

## 2022-02-25 DIAGNOSIS — I129 Hypertensive chronic kidney disease with stage 1 through stage 4 chronic kidney disease, or unspecified chronic kidney disease: Secondary | ICD-10-CM | POA: Diagnosis present

## 2022-02-25 DIAGNOSIS — Z7982 Long term (current) use of aspirin: Secondary | ICD-10-CM | POA: Diagnosis not present

## 2022-02-25 DIAGNOSIS — I6529 Occlusion and stenosis of unspecified carotid artery: Secondary | ICD-10-CM | POA: Diagnosis present

## 2022-02-25 DIAGNOSIS — Z681 Body mass index (BMI) 19 or less, adult: Secondary | ICD-10-CM | POA: Diagnosis not present

## 2022-02-25 DIAGNOSIS — E872 Acidosis, unspecified: Secondary | ICD-10-CM | POA: Diagnosis present

## 2022-02-25 DIAGNOSIS — E785 Hyperlipidemia, unspecified: Secondary | ICD-10-CM | POA: Diagnosis present

## 2022-02-25 DIAGNOSIS — Z66 Do not resuscitate: Secondary | ICD-10-CM | POA: Diagnosis not present

## 2022-02-25 DIAGNOSIS — Z885 Allergy status to narcotic agent status: Secondary | ICD-10-CM | POA: Diagnosis not present

## 2022-02-25 DIAGNOSIS — N1832 Chronic kidney disease, stage 3b: Secondary | ICD-10-CM | POA: Diagnosis present

## 2022-02-25 LAB — BASIC METABOLIC PANEL
Anion gap: 13 (ref 5–15)
BUN: 70 mg/dL — ABNORMAL HIGH (ref 8–23)
CO2: 18 mmol/L — ABNORMAL LOW (ref 22–32)
Calcium: 8.2 mg/dL — ABNORMAL LOW (ref 8.9–10.3)
Chloride: 101 mmol/L (ref 98–111)
Creatinine, Ser: 3.94 mg/dL — ABNORMAL HIGH (ref 0.44–1.00)
GFR, Estimated: 10 mL/min — ABNORMAL LOW (ref >60)
Glucose, Bld: 96 mg/dL (ref 70–99)
Potassium: 3.9 mmol/L (ref 3.5–5.1)
Sodium: 132 mmol/L — ABNORMAL LOW (ref 135–145)

## 2022-02-25 LAB — URINALYSIS, ROUTINE W REFLEX MICROSCOPIC
Bilirubin Urine: NEGATIVE
Glucose, UA: NEGATIVE mg/dL
Ketones, ur: NEGATIVE mg/dL
Nitrite: NEGATIVE
Protein, ur: 100 mg/dL — AB
Specific Gravity, Urine: 1.008 (ref 1.005–1.030)
pH: 7 (ref 5.0–8.0)

## 2022-02-25 LAB — CBC
HCT: 34.7 % — ABNORMAL LOW (ref 36.0–46.0)
Hemoglobin: 12 g/dL (ref 12.0–15.0)
MCH: 32.6 pg (ref 26.0–34.0)
MCHC: 34.6 g/dL (ref 30.0–36.0)
MCV: 94.3 fL (ref 80.0–100.0)
Platelets: 158 10*3/uL (ref 150–400)
RBC: 3.68 MIL/uL — ABNORMAL LOW (ref 3.87–5.11)
RDW: 12.3 % (ref 11.5–15.5)
WBC: 8 10*3/uL (ref 4.0–10.5)
nRBC: 0 % (ref 0.0–0.2)

## 2022-02-25 MED ORDER — ADULT MULTIVITAMIN W/MINERALS CH
1.0000 | ORAL_TABLET | Freq: Every day | ORAL | Status: DC
  Administered 2022-02-26 – 2022-03-01 (×4): 1 via ORAL
  Filled 2022-02-25 (×4): qty 1

## 2022-02-25 MED ORDER — ENSURE ENLIVE PO LIQD
237.0000 mL | Freq: Two times a day (BID) | ORAL | Status: DC
  Administered 2022-02-25 – 2022-03-01 (×6): 237 mL via ORAL

## 2022-02-25 NOTE — Assessment & Plan Note (Signed)
-  Follow electrolytes to avoid refeeding syndrome -Continue feeding supplements and encourage oral intake -Continue to maintain adequate hydration and once medically stable discharge home with home health services and palliative care.

## 2022-02-25 NOTE — Progress Notes (Signed)
Progress Note   Patient: [name] PXT:[id number] DOB: 31-Mar-1922 DOA: 02/24/2022     0 DOS: the patient was seen and examined on 02/25/2022   Brief hospital course: As per H&P written by Dr. Denton Brick on 02/24/2022 [name] is a 89 y.o. female with medical history significant for CKD 3, falls, hypertension. Patient was brought to the ED reports of reduced oral intake, and not doing her normal activities.  On my evaluation, patient is alone: Patient is able to answer questions appropriately.  She tells me she has been weak and falling, with poor appetite.  Denies pain.  No cough, no difficulty breathing.   Patient was in the ED 1/29-5 syncopal episode, patient was riding in a car with her son when she slumped over.  Patient was thought to be dehydrated, troponins mildly elevated.  Patient did not want to be admitted, hence patient was discharged home.   ED Course: Temperature 98.1.  Heart rate 83-90.  Blood pressure 113-121. Cr elevated at 4.34, last checked during ED visit 1/29 - Cr- 2.1. baseline- 1.2-1.6.  Potassium 2.9.  Magnesium 2.4.  TSH normal 1.46. 2 L bolus given. Hospitalist to admit for failure to thrive.  Assessment and Plan: * AKI (acute kidney injury) ([place]) -AKI on CKD stage IIIb.  Cr elevated at 4.34, last checked during ED visit 1/29 - Cr- 2.1.  -Appears to be secondary to prerenal azotemia and dehydration. -Continue minimizing nephrotoxic agents and avoid hypotension -Continue fluid resuscitation and follow electrolytes/renal function trend -Creatinine trending down (chronically 3.9).  FTT (failure to thrive) in adult -Presenting with weakness, falls, poor oral intake, resultant AKI and electrolyte abnormalities.   -Patient with concomitant deconditioning, generalized weakness and ongoing poor appetite. -While discussing with patient and her son decision for no artificial nutrition decided. -Continue to treat was treatable and follow clinical response. -Patient and  family in agreement to go home with home health services and palliative care follow-up.   -Continue fluid resuscitation, continue encourage oral intake and feeding supplement; follow magnesium and phosphorus level.  Failure to thrive in adult -Follow electrolytes to avoid refeeding syndrome -Continue feeding supplements and encourage oral intake -Continue to maintain adequate hydration and once medically stable discharge home with home health services and palliative care.  Electrolyte abnormality -Hyponatremia sodium 133, hypokalemia 2.9.  Possibly high anion gap metabolic acidosis; Likely due to AKI.   -Continue to maintain adequate hydration, follow electrolytes trend and provide repletion as needed.  Essential hypertension -Stable overall -Continue to follow -Continue holding the use of valsartan in the setting of acute on chronic renal failure.    Subjective:  Chronically ill in appearance; underweight and frail.  Reports decreased appetite.  No nausea, no vomiting.  Would like to go home.  Patient expressed feeling weak and tired.  Physical Exam: Vitals:   02/25/22 0457 02/25/22 0811 02/25/22 0900 02/25/22 1358  BP: 134/65 (!) 155/65  107/60  Pulse: 79 82  67  Resp: '15 16  16  '$ Temp: 97.9 F (36.6 C) (!) 97.5 F (36.4 C)  98.2 F (36.8 C)  TempSrc: Axillary Axillary  Oral  SpO2: 97%  100% 97%  Weight:      Height:       General exam: Alert, awake, oriented and following commands appropriately; patient reports feeling weak and not having much appetite. Respiratory system: Good saturation on room air; no using accessory muscles. Cardiovascular system:RRR. No rubs or gallops.  No JVD Gastrointestinal system: Abdomen is nondistended, soft  and nontender. No organomegaly or masses felt. Normal bowel sounds heard. Central nervous system: No focal neurological deficits. Extremities: No cyanosis or clubbing. Skin: No petechiae Psychiatry: Mood & affect appropriate.   Data  Reviewed: Urinalysis: Demonstrating moderate leukocyte esterase; negative nitrite, specific gravity of 1.657 Basic metabolic panel: Sodium 903, potassium 3.9, chloride 101, bicarb 18, glucose 96, BUN 70, creatinine 3.94 and GFR 10. CBC: WBC 8.0, hemoglobin 12.0 and platelet count 158 K  Family Communication: Son at bedside.  Disposition: Status is: Inpatient Remains inpatient appropriate because: Continue IV fluids; continue advancing diet and follow electrolytes/renal function trend.   Planned Discharge Destination: Home with Home Health  Time spent: 35 minutes  Author: Barton Dubois, MD 02/25/2022 4:57 PM  For on call review [email or web address].

## 2022-02-25 NOTE — Evaluation (Signed)
Occupational Therapy Evaluation Patient Details Name: [name] MRN: [id number] DOB: 1922/05/28 Today's Date: 02/25/2022   History of Present Illness [name] is a 89 y.o. female with medical history significant for CKD 3, falls, hypertension.  Patient was brought to the ED reports of reduced oral intake, and not doing her normal activities.  On my evaluation, patient is alone: Patient is able to answer questions appropriately.  She tells me she has been weak and falling, with poor appetite.  Denies pain.  No cough, no difficulty breathing. (Per MD)   Clinical Impression   Pt agreeable to OT and PT co-evaluation. Pt presents generally weak with low labored movement for ADL's and mobility. Pt reported no use of AD at baseline but required a RW today due to unsteady gait and deficits in balance while standing. Pt presents with generally B UE weakness which is more severe in L UE. Pt reports living with son who can give 24/7 assist. Pt will require assist if returning home including supervision while ambulating. Pt left in chair with NT present. Pt will benefit from continued OT in the hospital and recommended venue below to increase strength, balance, and endurance for safe ADL's.         Recommendations for follow up therapy are one component of a multi-disciplinary discharge planning process, led by the attending physician.  Recommendations may be updated based on patient status, additional functional criteria and insurance authorization.   Follow Up Recommendations  Home health OT     Assistance Recommended at Discharge Intermittent Supervision/Assistance  Patient can return home with the following A little help with walking and/or transfers;A little help with bathing/dressing/bathroom;Assistance with cooking/housework;Help with stairs or ramp for entrance;Assist for transportation;Direct supervision/assist for medications management    Functional Status Assessment  Patient has  had a recent decline in their functional status and demonstrates the ability to make significant improvements in function in a reasonable and predictable amount of time.  Equipment Recommendations  None recommended by OT    Recommendations for Other Services       Precautions / Restrictions Precautions Precautions: Fall Restrictions Weight Bearing Restrictions: No      Mobility Bed Mobility Overal bed mobility: Modified Independent                  Transfers Overall transfer level: Needs assistance Equipment used: Rolling walker (2 wheels) Transfers: Sit to/from Stand, Bed to chair/wheelchair/BSC Sit to Stand: Min guard, Min assist     Step pivot transfers: Min guard     General transfer comment: Min G to min A to boost from EOB. More min G once standing. Cuing needed for proper technique to boost from EOB.      Balance Overall balance assessment: Needs assistance Sitting-balance support: Feet supported, Bilateral upper extremity supported Sitting balance-Leahy Scale: Fair Sitting balance - Comments: fair to good seated at EOB   Standing balance support: Bilateral upper extremity supported, During functional activity, Reliant on assistive device for balance Standing balance-Leahy Scale: Fair Standing balance comment: using RW                           ADL either performed or assessed with clinical judgement   ADL Overall ADL's : Needs assistance/impaired     Grooming: Standing;Min guard   Upper Body Bathing: Set up;Sitting   Lower Body Bathing: Set up;Sitting/lateral leans   Upper Body Dressing : Set up;Sitting   Lower Body  Dressing: Set up;Sitting/lateral leans Lower Body Dressing Details (indicate cue type and reason): Able to doff and don socks seated in chair with mild labored effort and extended time. Toilet Transfer: Min guard;Ambulation;Rolling walker (2 wheels) Toilet Transfer Details (indicate cue type and reason): Pt able to  ambulate to toilet and back to chair with RW and min G assist. Toileting- Clothing Manipulation and Hygiene: Supervision/safety;Sitting/lateral lean Toileting - Clothing Manipulation Details (indicate cue type and reason): Pt completed peri-care without physical assist while seated on the toilet.     Functional mobility during ADLs: Min guard;Rolling walker (2 wheels) General ADL Comments: PT able to ambulate over 50 feet in the hall with RW.     Vision Baseline Vision/History: 1 Wears glasses Ability to See in Adequate Light: 0 Adequate Patient Visual Report: No change from baseline Vision Assessment?: No apparent visual deficits                Pertinent Vitals/Pain Pain Assessment Pain Assessment: No/denies pain     Hand Dominance Right   Extremity/Trunk Assessment Upper Extremity Assessment Upper Extremity Assessment: RUE deficits/detail;LUE deficits/detail RUE Deficits / Details: generally weak. LUE Deficits / Details: 2+/5 shoulder flexion MMT. Generally weak otherwise.   Lower Extremity Assessment Lower Extremity Assessment: Defer to PT evaluation   Cervical / Trunk Assessment Cervical / Trunk Assessment: Kyphotic   Communication Communication Communication: No difficulties   Cognition Arousal/Alertness: Awake/alert Behavior During Therapy: WFL for tasks assessed/performed Overall Cognitive Status: Within Functional Limits for tasks assessed                                                        Home Living Family/patient expects to be discharged to:: Private residence Living Arrangements: Children Available Help at Discharge: Family;Available PRN/intermittently Type of Home: House Home Access: Level entry     Home Layout: Able to live on main level with bedroom/bathroom     Bathroom Shower/Tub: Teacher, early years/pre: Standard Bathroom Accessibility: No   Home Equipment: Shower seat   Additional Comments: Lives  with son who is available 24/7 per report of pt. Pt seemed unsure of some elements of home living.      Prior Functioning/Environment Prior Level of Function : Needs assist       Physical Assist : ADLs (physical)   ADLs (physical): IADLs;Bathing;Dressing Mobility Comments: Reports mod indep household ambulator without DME ADLs Comments: Family assists with ADL/iADLs as needed. Pt reports that someone comes in to help clean.        OT Problem List: Decreased strength;Decreased range of motion;Decreased activity tolerance;Impaired balance (sitting and/or standing)      OT Treatment/Interventions: Self-care/ADL training;Therapeutic exercise;Therapeutic activities;Patient/family education;DME and/or AE instruction;Balance training    OT Goals(Current goals can be found in the care plan section) Acute Rehab OT Goals Patient Stated Goal: return home OT Goal Formulation: With patient Time For Goal Achievement: 03/11/22 Potential to Achieve Goals: Good  OT Frequency: Min 1X/week    Co-evaluation PT/OT/SLP Co-Evaluation/Treatment: Yes Reason for Co-Treatment: To address functional/ADL transfers   OT goals addressed during session: ADL's and self-care      AM-PAC OT "6 Clicks" Daily Activity     Outcome Measure Help from another person eating meals?: None Help from another person taking care of personal grooming?: A Little Help from another  person toileting, which includes using toliet, bedpan, or urinal?: A Little Help from another person bathing (including washing, rinsing, drying)?: A Little Help from another person to put on and taking off regular upper body clothing?: None Help from another person to put on and taking off regular lower body clothing?: A Little 6 Click Score: 20   End of Session Equipment Utilized During Treatment: Rolling walker (2 wheels)  Activity Tolerance: Patient tolerated treatment well Patient left: in chair;with call bell/phone within reach;with  family/visitor present  OT Visit Diagnosis: Unsteadiness on feet (R26.81);Other abnormalities of gait and mobility (R26.89);Muscle weakness (generalized) (M62.81);Adult, failure to thrive (R62.7)                Time: 1886-7737 OT Time Calculation (min): 23 min Charges:  OT General Charges $OT Visit: 1 Visit OT Evaluation $OT Eval Low Complexity: 1 Low  Korrie Hofbauer OT, MOT   Larey Seat 02/25/2022, 9:03 AM

## 2022-02-25 NOTE — Progress Notes (Signed)
Pt resting peacefully in bed.  Denies pain.  Able to tolerate small amounts of po fluids, swallowed meds without difficulty.   Unable to tell me time of day but otherwise oriented.  Lies in side lying fetal position in bed for most of noc, withdrawn.  NSR on monitor.

## 2022-02-25 NOTE — Consult Note (Signed)
Consultation Note Date: 02/25/2022   Patient Name: [name]  DOB: 11-03-1922  MRN: [id number]  Age / Sex: 89 y.o., female  PCP: [name], MD Referring Physician: Barton Dubois, MD  Reason for Consultation: Establishing goals of care  HPI/Patient Profile: 89 y.o. female  with past medical history of CKD 3, falls, HTN/HLD, mild memory loss, carcinoid tumor of stomach 2011, hiatal hernia/GERD/diverticulosis, pernicious anemia admitted on 02/24/2022 with acute kidney injury on CKD 3, failure to thrive.   Clinical Assessment and Goals of Care: I have reviewed medical records including EPIC notes, labs and imaging, received report from RN, assessed the patient.  [name] is sitting up in the [place] chair in her room.  She greets me, making and somewhat keeping eye contact.  She is alert and oriented x 3, able to make her needs known.  There is no family at bedside at this time.  We discussed a brief life review of the patient.  [name] is a widow.  She is unable to tell me when her husband passed.  She tells me that she only had 1 child, her Jessica [name].  She lives in his home.  Call to Jessica/HCPOA, Jessica Herb., to discuss diagnosis prognosis, GOC, EOL wishes, disposition and options.  I introduced Palliative Medicine as specialized medical care for people living with serious illness. It focuses on providing relief from the symptoms and stress of a serious illness. The goal is to improve quality of life for both the patient and the family.  We then focused on their current illness.  We talk about [name] failure to thrive and acute on chronic kidney injury.  We talk about returning home with home health.   The natural disease trajectory and expectations at EOL were discussed.   Advanced directives, concepts specific to code status, artifical feeding and hydration, and rehospitalization were  considered and discussed.  [name] shares that his mother has a living will completed in 09 but he is not sure of what this living will contains.  He does shared that this was completed quite sometime ago.  I shared that things may be looking different now.  He tells me that he will review her wishes.  Hospice and Palliative Care services outpatient were explained and offered.  [name] shared that his mother-in-law had hospice care.  He is open to discussing palliative or hospice care with hospice of [facility].  Provider choice offered.  He shares that [name] has a tumor in her stomach.  This would likely qualify her for palliative/hospice care.  Discussed the importance of continued conversation with family and the medical providers regarding overall plan of care and treatment options, ensuring decisions are within the context of the patient's values and GOCs.  Questions and concerns were addressed.  The family was encouraged to call with questions or concerns.  PMT will continue to support holistically.  Conference with attending, bedside nursing staff, transition of care team related to patient condition, needs,  goals of care, disposition.   HCPOA  NEXT OF KIN -only child, Jessica Nusaybah, Ivie. Jessica states that [name] husband died in 74.    SUMMARY OF RECOMMENDATIONS   At this point continue to treat the treatable Home with home health   Code Status/Advance Care Planning: Full code -Jessica states that he recently got a copy of living will completed in '09.  He will review her choices and consider CODE STATUS.  Symptom Management:  Per hospitalist, no additional needs at this time.  Palliative Prophylaxis:  Oral Care and Turn Reposition  Additional Recommendations (Limitations, Scope, Preferences): Full Scope Treatment  Psycho-social/Spiritual:  Desire for further Chaplaincy support:no Additional Recommendations: Caregiving  Support/Resources  Prognosis:   Unable to determine, based on outcomes.  6 months or less would not be surprising based on advanced age, chronic illness burden.  Discharge Planning: Home with Home Health      Primary Diagnoses: Present on Admission:  AKI (acute kidney injury) ([place])  Electrolyte abnormality  FTT (failure to thrive) in adult  Essential hypertension  Chronic kidney disease, stage 3b ([place])   I have reviewed the medical record, interviewed the patient and family, and examined the patient. The following aspects are pertinent.  Past Medical History:  Diagnosis Date   Adult BMI 19-24 kg/sq m 2008 125 lbs   Anemia    Pernicious 2008-HB 12 MCV 119.5; JAN 2012 HB 13.2 MCV 97.6   Carcinoid tumor of stomach 2008   Diverticulosis    GERD (gastroesophageal reflux disease)    Hiatal hernia    HTN (hypertension)    Hyperlipidemia    Hypertension    Phreesia 02/07/2020   Mild memory loss 02/15/2011   Near syncope 04/18/2014   Pernicious anemia    Pernicious anemia 06/14/2010   Wrist fracture, left 2008   Social History   Socioeconomic History   Marital status: Widowed    Spouse name: Not on file   Number of children: 1   Years of education: 19   Highest education level: Not on file  Occupational History   Not on file  Tobacco Use   Smoking status: Never   Smokeless tobacco: Never  Vaping Use   Vaping Use: Never used  Substance and Sexual Activity   Alcohol use: No   Drug use: No   Sexual activity: Not Currently    Birth control/protection: Post-menopausal  Other Topics Concern   Not on file  Social History Narrative   Live with [name]   Social Determinants of Health   Financial Resource Strain: Low Risk  (12/28/2021)   Overall Financial Resource Strain (CARDIA)    Difficulty of Paying Living Expenses: Not hard at all  Food Insecurity: No Food Insecurity (02/24/2022)   Hunger Vital Sign    Worried About Running Out of Food in the Last Year: Never true    [place] in the Last  Year: Never true  Transportation Needs: No Transportation Needs (02/24/2022)   PRAPARE - Hydrologist (Medical): No    Lack of Transportation (Non-Medical): No  Physical Activity: Inactive (02/22/2021)   Exercise Vital Sign    Days of Exercise per Week: 0 days    Minutes of Exercise per Session: 0 min  Stress: No Stress Concern Present (02/22/2021)   [place]    Feeling of Stress : Not at all  Social Connections: Moderately Integrated (02/22/2021)   Social  Connection and Isolation Panel [NHANES]    Frequency of Communication with Friends and Family: Three times a week    Frequency of Social Gatherings with Friends and Family: More than three times a week    Attends Religious Services: More than 4 times per year    Active Member of [employer] or Organizations: Yes    Attends Archivist Meetings: Never    Marital Status: Widowed   Family History  Problem Relation Age of Onset   Cancer Mother    Heart disease Brother    Kidney disease Jessica    Colon cancer Neg Hx    Colon polyps Neg Hx    Scheduled Meds:  amLODipine  10 mg Oral Daily   aspirin EC  81 mg Oral Q breakfast   clopidogrel  75 mg Oral Daily   feeding supplement  237 mL Oral BID BM   heparin  5,000 Units Subcutaneous Q8H   levothyroxine  25 mcg Oral Q0600   [START ON 02/26/2022] multivitamin with minerals  1 tablet Oral Daily   Continuous Infusions:  sodium chloride 75 mL/hr at 02/25/22 0442   PRN Meds:.acetaminophen **OR** acetaminophen, ondansetron **OR** ondansetron (ZOFRAN) IV, polyethylene glycol Medications Prior to Admission:  Prior to Admission medications   Medication Sig Start Date End Date Taking? Authorizing Provider  acetaminophen (TYLENOL) 500 MG tablet Take 500 mg by mouth every 6 (six) hours as needed for mild pain. For pain   Yes [provider]  amLODipine (NORVASC) 10 MG tablet Take 1 tablet (10 mg  total) by mouth daily. 12/25/21  Yes Lyndal Pulley, MD  aspirin EC 81 MG tablet Take 1 tablet (81 mg total) by mouth daily with breakfast. 06/22/21 06/22/22 Yes Emokpae, Courage, MD  clopidogrel (PLAVIX) 75 MG tablet Take 1 tablet (75 mg total) by mouth daily. For 3 months with 81 mg aspirin 12/24/21 03/24/22 Yes Elodia Florence., MD  donepezil (ARICEPT) 5 MG tablet TAKE 1 TABLET BY MOUTH AT BEDTIME Patient taking differently: Take 5 mg by mouth at bedtime. 02/13/22  Yes [name], MD  famotidine (PEPCID) 10 MG tablet Take 1 tablet (10 mg total) by mouth daily. 12/23/21 02/25/22 Yes Elodia Florence., MD  levothyroxine (SYNTHROID) 25 MCG tablet TAKE 1 TABLET BY MOUTH ONCE DAILY BEFORE BREAKFAST 11/28/21  Yes [name], MD  rosuvastatin (CRESTOR) 40 MG tablet Take 1 tablet (40 mg total) by mouth daily. 12/26/21  Yes Lyndal Pulley, MD  valsartan (DIOVAN) 40 MG tablet Take 1 tablet (40 mg total) by mouth daily. 10/19/21  Yes [name], MD   Allergies  Allergen Reactions   Codeine    Review of Systems  Unable to perform ROS: Age    Physical Exam Vitals and nursing note reviewed.  Constitutional:      General: She is not in acute distress.    Appearance: She is ill-appearing.  HENT:     Mouth/Throat:     Mouth: Mucous membranes are moist.  Cardiovascular:     Rate and Rhythm: Normal rate.  Pulmonary:     Effort: Pulmonary effort is normal.  Skin:    General: Skin is warm and dry.  Neurological:     Mental Status: She is alert and oriented to person, place, and time.  Psychiatric:        Mood and Affect: Mood normal.        Behavior: Behavior normal.     Vital Signs: BP  107/60 (BP Location: Right Arm)   Pulse 67   Temp 98.2 F (36.8 C) (Oral)   Resp 16   Ht '5\' 1"'$  (1.549 m)   Wt 40.6 kg   SpO2 97%   BMI 16.91 kg/m  Pain Scale: 0-10   Pain Score: 0-No pain   SpO2: SpO2: 97 % O2 Device:SpO2: 97 % O2 Flow Rate: .   IO: Intake/output summary:   Intake/Output Summary (Last 24 hours) at 02/25/2022 1517 Last data filed at 02/25/2022 1358 Gross per 24 hour  Intake 2584.69 ml  Output 200 ml  Net 2384.69 ml    LBM: Last BM Date : 02/23/22 Baseline Weight: Weight: 43.5 kg Most recent weight: Weight: 40.6 kg     Palliative Assessment/Data:     Time In: 1400 Time Out: 1515 Time Total: 75 minutes  Greater than 50%  of this time was spent counseling and coordinating care related to the above assessment and plan.  Signed by: Drue Novel, NP   Please contact Palliative Medicine Team phone at [phone] for questions and concerns.  For individual provider: See Shea Evans

## 2022-02-25 NOTE — Plan of Care (Signed)
  Problem: Acute Rehab PT Goals(only PT should resolve) Goal: Pt Will Go Supine/Side To Sit Outcome: Progressing Flowsheets (Taken 02/25/2022 1157) Pt will go Supine/Side to Sit:  Independently  with modified independence Goal: Patient Will Transfer Sit To/From Stand Outcome: Progressing Flowsheets (Taken 02/25/2022 1157) Patient will transfer sit to/from stand:  with modified independence  with supervision Goal: Pt Will Transfer Bed To Chair/Chair To Bed Outcome: Progressing Flowsheets (Taken 02/25/2022 1157) Pt will Transfer Bed to Chair/Chair to Bed:  with modified independence  with supervision Goal: Pt Will Ambulate Outcome: Progressing Flowsheets (Taken 02/25/2022 1157) Pt will Ambulate:  100 feet  with supervision  with modified independence  with rolling walker   11:58 AM, 02/25/22 Lonell Grandchild, MPT Physical Therapist with [facility] 336 [phone] office [phone] mobile phone

## 2022-02-25 NOTE — Evaluation (Signed)
Physical Therapy Evaluation Patient Details Name: [name] MRN: [id number] DOB: Dec 27, 1922 Today's Date: 02/25/2022  History of Present Illness  [name] is a 89 y.o. female with medical history significant for CKD 3, falls, hypertension.  Patient was brought to the ED reports of reduced oral intake, and not doing her normal activities.  On my evaluation, patient is alone: Patient is able to answer questions appropriately.  She tells me she has been weak and falling, with poor appetite.  Denies pain.  No cough, no difficulty breathing.   Clinical Impression  Patient unsteady on feet when taking steps without use of AD having to lean on nearby objects for support, safer using RW with good return demonstrated for transferring to/from commode in bathroom and ambulating in room/hallway without loss of balance.  Patient tolerated sitting up in chair after therapy.  Patient will benefit from continued skilled physical therapy in hospital and recommended venue below to increase strength, balance, endurance for safe ADLs and gait.         Recommendations for follow up therapy are one component of a multi-disciplinary discharge planning process, led by the attending physician.  Recommendations may be updated based on patient status, additional functional criteria and insurance authorization.  Follow Up Recommendations Home health PT      Assistance Recommended at Discharge Set up Supervision/Assistance  Patient can return home with the following  A little help with walking and/or transfers;A little help with bathing/dressing/bathroom;Help with stairs or ramp for entrance;Assistance with cooking/housework    Equipment Recommendations Rolling walker (2 wheels)  Recommendations for Other Services       Functional Status Assessment Patient has had a recent decline in their functional status and demonstrates the ability to make significant improvements in function in a reasonable and  predictable amount of time.     Precautions / Restrictions Precautions Precautions: Fall Restrictions Weight Bearing Restrictions: No      Mobility  Bed Mobility Overal bed mobility: Modified Independent                  Transfers Overall transfer level: Needs assistance Equipment used: Rolling walker (2 wheels) Transfers: Sit to/from Stand, Bed to chair/wheelchair/BSC Sit to Stand: Min guard, Min assist   Step pivot transfers: Min guard       General transfer comment: unsteady labored movement requiring hand held assist to maintain standing balance when not using an AD, safer using RW    Ambulation/Gait Ambulation/Gait assistance: Supervision, Min guard Gait Distance (Feet): 75 Feet Assistive device: Rolling walker (2 wheels) Gait Pattern/deviations: Decreased step length - right, Decreased step length - left, Decreased stride length, Trunk flexed Gait velocity: decreased     General Gait Details: slow slightly labored cadence without loss of balance using RW, limited mostly due to fatigue  Stairs            Wheelchair Mobility    Modified Rankin (Stroke Patients Only)       Balance Overall balance assessment: Needs assistance Sitting-balance support: Feet supported, No upper extremity supported Sitting balance-Leahy Scale: Fair Sitting balance - Comments: fair/good seated at EOB   Standing balance support: During functional activity, No upper extremity supported Standing balance-Leahy Scale: Poor Standing balance comment: fair/poor without AD, fair/good using RW                             Pertinent Vitals/Pain Pain Assessment Pain Assessment: No/denies pain  Home Living Family/patient expects to be discharged to:: Private residence Living Arrangements: Children Available Help at Discharge: Family;Available PRN/intermittently Type of Home: House Home Access: Level entry       Home Layout: Able to live on main level  with bedroom/bathroom Home Equipment: Shower seat Additional Comments: .    Prior Function Prior Level of Function : Needs assist       Physical Assist : ADLs (physical);Mobility (physical) Mobility (physical): Bed mobility;Transfers;Gait;Stairs ADLs (physical): IADLs;Bathing;Dressing Mobility Comments: Reports mod indep household ambulator without DME ADLs Comments: Family assists with ADL/iADLs as needed. Pt reports that someone comes in to help clean.     Hand Dominance   Dominant Hand: Right    Extremity/Trunk Assessment   Upper Extremity Assessment Upper Extremity Assessment: Defer to OT evaluation RUE Deficits / Details: generally weak. LUE Deficits / Details: 2+/5 shoulder flexion MMT. Generally weak otherwise.    Lower Extremity Assessment Lower Extremity Assessment: Generalized weakness    Cervical / Trunk Assessment Cervical / Trunk Assessment: Kyphotic  Communication   Communication: No difficulties  Cognition Arousal/Alertness: Awake/alert Behavior During Therapy: WFL for tasks assessed/performed Overall Cognitive Status: Within Functional Limits for tasks assessed                                          General Comments      Exercises     Assessment/Plan    PT Assessment Patient needs continued PT services  PT Problem List Decreased strength;Decreased activity tolerance;Decreased balance;Decreased mobility       PT Treatment Interventions DME instruction;Gait training;Stair training;Functional mobility training;Therapeutic activities;Therapeutic exercise;Patient/family education;Balance training    PT Goals (Current goals can be found in the Care Plan section)  Acute Rehab PT Goals Patient Stated Goal: return home with family to assist PT Goal Formulation: With patient Time For Goal Achievement: 03/01/22 Potential to Achieve Goals: Good    Frequency Min 3X/week     Co-evaluation PT/OT/SLP Co-Evaluation/Treatment:  Yes Reason for Co-Treatment: To address functional/ADL transfers PT goals addressed during session: Mobility/safety with mobility;Balance;Proper use of DME OT goals addressed during session: ADL's and self-care       AM-PAC PT "6 Clicks" Mobility  Outcome Measure Help needed turning from your back to your side while in a flat bed without using bedrails?: None Help needed moving from lying on your back to sitting on the side of a flat bed without using bedrails?: None Help needed moving to and from a bed to a chair (including a wheelchair)?: A Little Help needed standing up from a chair using your arms (e.g., wheelchair or bedside chair)?: A Little Help needed to walk in hospital room?: A Little Help needed climbing 3-5 steps with a railing? : A Little 6 Click Score: 20    End of Session   Activity Tolerance: Patient tolerated treatment well;Patient limited by fatigue Patient left: in chair;with call bell/phone within reach Nurse Communication: Mobility status PT Visit Diagnosis: Unsteadiness on feet (R26.81);Other abnormalities of gait and mobility (R26.89);Muscle weakness (generalized) (M62.81)    Time: 6734-1937 PT Time Calculation (min) (ACUTE ONLY): 22 min   Charges:   PT Evaluation $PT Eval Moderate Complexity: 1 Mod PT Treatments $Therapeutic Activity: 8-22 mins        11:56 AM, 02/25/22 Lonell Grandchild, MPT Physical Therapist with [facility] [phone] office [phone] mobile phone

## 2022-02-25 NOTE — Progress Notes (Signed)
Initial Nutrition Assessment  DOCUMENTATION CODES:   Underweight  INTERVENTION:   Ensure Enlive po BID, each supplement provides 350 kcal and 20 grams of protein.  Magic cup TID with meals, each supplement provides 290 kcal and 9 grams of protein  MVI po daily   Dysphagia 3 diet   Pt at high refeed risk; recommend monitor potassium, magnesium and phosphorus labs daily until stable  NUTRITION DIAGNOSIS:   Inadequate oral intake related to chronic illness (dementia, advanced age) as evidenced by per patient/family report.  GOAL:   Patient will meet greater than or equal to 90% of their needs  MONITOR:   PO intake, Supplement acceptance, Labs, Weight trends, Skin, I & O's  REASON FOR ASSESSMENT:   Consult Assessment of nutrition requirement/status  ASSESSMENT:   89 y/o female with h/o HTN, CKD III, neuroendocrine carcinoid tumor of stomach, HLD, GERD, IDA, dementia, IDA and B12 deficiency who is admitted with AKI secondary to poor oral intake.  RD working remotely.  Unable to speak with pt by phone. Per chart review, pt and pt's son reported pt with poor oral intake pta. Spoke with RN, pt is eating ~75% of meals in hospital. RD will add supplements and MVI to help pt meet her estimated needs. Pt is likely at refeed risk. Per chart, pt is down ~10lbs(10%) over the past 4 months; this is significant weight loss. Pt is at high risk for malnutrition but unable to diagnose at this time. RD will obtain nutrition related history and exam at follow up.   Medications reviewed and include: aspirin, plavix, heparin, synthroid, NaCl '@75ml'$ /hr   Labs reviewed: Na 132(L), K 3.9 wnl, BUN 70(H), creat 3.94(H) P 6.3(H), Mg 2.4 wnl- 2/4  NUTRITION - FOCUSED PHYSICAL EXAM: Unable to perform at this time   Diet Order:   Diet Order             Diet regular Room service appropriate? Yes; Fluid consistency: Thin  Diet effective now                  EDUCATION NEEDS:   No  education needs have been identified at this time  Skin:  Skin Assessment: Reviewed RN Assessment (ecchymosis)  Last BM:  2/3  Height:   Ht Readings from Last 1 Encounters:  02/24/22 '5\' 1"'$  (1.549 m)    Weight:   Wt Readings from Last 1 Encounters:  02/24/22 40.6 kg    Ideal Body Weight:  47.7 kg  BMI:  Body mass index is 16.91 kg/m.  Estimated Nutritional Needs:   Kcal:  1000-1200kcal/day  Protein:  50-60g/day  Fluid:  1.0-1.2L/day  Koleen Distance MS, RD, LDN Please refer to [facility] for RD and/or RD on-call/weekend/after hours pager

## 2022-02-25 NOTE — TOC Initial Note (Addendum)
Transition of Care [facility]) - Initial/Assessment Note    Patient Details  Name: [name] MRN: [id number] Date of Birth: 10/01/1922  Transition of Care [facility]) CM/SW Contact:    Salome Arnt, [place] Phone Number: 02/25/2022, 1:24 PM  Clinical Narrative:  Pt admitted due to acute kidney injury and failure to thrive. Assessment completed with pt's son, Jeanell Sparrow as pt oriented to self only per chart. Ray reports pt lives with him. PT/OT evaluated pt and recommend home health. Discussed with Ray who is agreeable with no preference on agency. [facility] can accept for PT only. MD agreeable. Will need HHPT order. PT also recommending rolling walker. However, son states he just bought pt rollator. PT notified and agreeable.                Per palliative, refer to [facility] for outpatient palliative. Referral made.   Expected Discharge Plan: [place] Barriers to Discharge: Continued Medical Work up   Patient Goals and CMS Choice Patient states their goals for this hospitalization and ongoing recovery are:: return home   Choice offered to / list presented to : Adult [place] ownership interest in [facility].provided to::  (n/a)    Expected Discharge Plan and Services In-house Referral: Clinical Social Work   Post Acute Care Choice: [place] arrangements for the past 2 months: [place]: PT [place]: [place] Date [place]: 02/25/22 Time [place]: [place] Representative spoke with at [place]: [place] Arrangements/Services Living arrangements for the past 2 months: [place] Lives with:: Adult Children Patient language and need for interpreter reviewed:: Yes Do you feel safe going back to the place where you live?: Yes      Need for Family Participation in Patient Care: Yes (Comment) Care giver support system in place?: Yes  (comment) Current home services: DME (rollator) Criminal Activity/Legal Involvement Pertinent to Current Situation/Hospitalization: No - Comment as needed  Activities of Daily Living Home Assistive Devices/Equipment: Wheelchair, Environmental consultant (specify type) ADL Screening (condition at time of admission) Patient's cognitive ability adequate to safely complete daily activities?: Yes Is the patient deaf or have difficulty hearing?: No Does the patient have difficulty seeing, even when wearing glasses/contacts?: No Does the patient have difficulty concentrating, remembering, or making decisions?: No Patient able to express need for assistance with ADLs?: Yes Does the patient have difficulty dressing or bathing?: Yes Independently performs ADLs?: No Communication: Independent Dressing (OT): Needs assistance Is this a change from baseline?: Pre-admission baseline Grooming: Needs assistance Is this a change from baseline?: Pre-admission baseline Feeding: Needs assistance Is this a change from baseline?: Pre-admission baseline Bathing: Needs assistance Is this a change from baseline?: Pre-admission baseline Toileting: Needs assistance Is this a change from baseline?: Pre-admission baseline In/Out Bed: Needs assistance Is this a change from baseline?: Pre-admission baseline Walks in Home: Needs assistance Is this a change from baseline?: Pre-admission baseline Does the patient have difficulty walking or climbing stairs?: Yes Weakness of Legs: Both Weakness of Arms/Hands: Both  Permission Sought/Granted                  Emotional Assessment   Attitude/Demeanor/Rapport: Unable to Assess Affect (typically observed): Unable to Assess Orientation: : Oriented to Self Alcohol / Substance Use: Not Applicable Psych  Involvement: No (comment)  Admission diagnosis:  Dehydration [E86.0] Failure to thrive in adult [R62.7] AKI (acute kidney injury) ([place]) [N17.9] Patient Active Problem List    Diagnosis Date Noted   AKI (acute kidney injury) ([place]) 02/24/2022   Electrolyte abnormality 02/24/2022   FTT (failure to thrive) in adult 02/24/2022   Altered mental status 12/23/2021   Chronic kidney disease, stage 3b ([place]) 12/20/2021   Neck pain 10/19/2021   Hospital discharge follow-up 06/28/2021   Fall 06/20/2021   Stage 3a chronic kidney disease ([place]) 02/05/2021   Encounter for general adult medical examination with abnormal findings 02/05/2021   Dementia ([place]) 04/06/2020   Acquired hypothyroidism 02/10/2020   Renal mass 02/10/2020   Pulmonary nodule 02/10/2020   Iron deficiency anemia 01/26/2020   Carotid disease, bilateral ([place]) 04/19/2014   Syncope and collapse 04/18/2014   Pernicious anemia 06/14/2010   Neuroendocrine carcinoid tumor of stomach 09/28/2009   Mixed hyperlipidemia 04/03/2009   Essential hypertension 04/03/2009   GERD 04/03/2009   PCP:  [name], MD Pharmacy:   [facility], [place] [address] [place] [address] Phone: [phone] Fax: [place] [address], [place] - [place] [address] [place] [address] [place] [address] Phone: [phone] Fax: [phone]     Social Determinants of Health (SDOH) Social History: SDOH Screenings   Food Insecurity: No Food Insecurity (02/24/2022)  Housing: Low Risk  (02/24/2022)  Transportation Needs: No Transportation Needs (02/24/2022)  Utilities: Not At Risk (02/24/2022)  Alcohol Screen: Low Risk  (02/22/2021)  Depression (PHQ2-9): Low Risk  (02/07/2022)  Financial Resource Strain: Low Risk  (12/28/2021)  Physical Activity: Inactive (02/22/2021)  Social Connections: Moderately Integrated (02/22/2021)  Stress: No Stress Concern Present (02/22/2021)  Tobacco Use: Low Risk  (02/24/2022)   SDOH Interventions: Housing Interventions: Intervention Not Indicated   Readmission Risk Interventions     No data to display

## 2022-02-25 NOTE — Plan of Care (Signed)
  Problem: Acute Rehab OT Goals (only OT should resolve) Goal: Pt. Will Perform Grooming Flowsheets (Taken 02/25/2022 0906) Pt Will Perform Grooming:  with modified independence  standing Goal: Pt. Will Perform Lower Body Dressing Flowsheets (Taken 02/25/2022 0906) Pt Will Perform Lower Body Dressing:  with modified independence  sitting/lateral leans Goal: Pt. Will Transfer To Toilet Flowsheets (Taken 02/25/2022 0906) Pt Will Transfer to Toilet:  with modified independence  ambulating Goal: Pt/Caregiver Will Perform Home Exercise Program Flowsheets (Taken 02/25/2022 0906) Pt/caregiver will Perform Home Exercise Program:  Increased ROM  Increased strength  Right Upper extremity  Left upper extremity  Independently  Armonie Mettler OT, MOT

## 2022-02-26 DIAGNOSIS — Z515 Encounter for palliative care: Secondary | ICD-10-CM | POA: Diagnosis not present

## 2022-02-26 DIAGNOSIS — R627 Adult failure to thrive: Secondary | ICD-10-CM | POA: Diagnosis not present

## 2022-02-26 DIAGNOSIS — N179 Acute kidney failure, unspecified: Secondary | ICD-10-CM | POA: Diagnosis not present

## 2022-02-26 DIAGNOSIS — E860 Dehydration: Secondary | ICD-10-CM | POA: Diagnosis not present

## 2022-02-26 DIAGNOSIS — Z7189 Other specified counseling: Secondary | ICD-10-CM | POA: Diagnosis not present

## 2022-02-26 DIAGNOSIS — I10 Essential (primary) hypertension: Secondary | ICD-10-CM | POA: Diagnosis not present

## 2022-02-26 LAB — MAGNESIUM: Magnesium: 2.1 mg/dL (ref 1.7–2.4)

## 2022-02-26 LAB — BASIC METABOLIC PANEL
Anion gap: 11 (ref 5–15)
BUN: 65 mg/dL — ABNORMAL HIGH (ref 8–23)
CO2: 20 mmol/L — ABNORMAL LOW (ref 22–32)
Calcium: 7.9 mg/dL — ABNORMAL LOW (ref 8.9–10.3)
Chloride: 105 mmol/L (ref 98–111)
Creatinine, Ser: 3.87 mg/dL — ABNORMAL HIGH (ref 0.44–1.00)
GFR, Estimated: 10 mL/min — ABNORMAL LOW (ref >60)
Glucose, Bld: 105 mg/dL — ABNORMAL HIGH (ref 70–99)
Potassium: 3.5 mmol/L (ref 3.5–5.1)
Sodium: 136 mmol/L (ref 135–145)

## 2022-02-26 LAB — PHOSPHORUS: Phosphorus: 3.4 mg/dL (ref 2.5–4.6)

## 2022-02-26 MED ORDER — SODIUM CHLORIDE 0.9 % IV SOLN: INTRAVENOUS | Status: AC

## 2022-02-26 NOTE — Care Management Important Message (Signed)
Important Message  Patient Details  Name: [name] MRN: [id number] Date of Birth: 1922-04-10   Medicare Important Message Given:  N/A - LOS <3 / Initial given by admissions     [name] 02/26/2022, 10:31 AM

## 2022-02-26 NOTE — Progress Notes (Signed)
Progress Note   Patient: [name]:[id number] DOB: 1922-03-25 DOA: 02/24/2022     1 DOS: the patient was seen and examined on 02/26/2022   Brief hospital course: As per H&P written by Dr. Denton Brick on 02/24/2022 [name] is a 89 y.o. female with medical history significant for CKD 3, falls, hypertension. Patient was brought to the ED reports of reduced oral intake, and not doing her normal activities.  On my evaluation, patient is alone: Patient is able to answer questions appropriately.  She tells me she has been weak and falling, with poor appetite.  Denies pain.  No cough, no difficulty breathing.   Patient was in the ED 1/29-5 syncopal episode, patient was riding in a car with her son when she slumped over.  Patient was thought to be dehydrated, troponins mildly elevated.  Patient did not want to be admitted, hence patient was discharged home.   ED Course: Temperature 98.1.  Heart rate 83-90.  Blood pressure 113-121. Cr elevated at 4.34, last checked during ED visit 1/29 - Cr- 2.1. baseline- 1.2-1.6.  Potassium 2.9.  Magnesium 2.4.  TSH normal 1.46. 2 L bolus given. Hospitalist to admit for failure to thrive.  Assessment and Plan: * AKI (acute kidney injury) ([place]) -AKI on CKD stage IIIb.  Cr elevated at 4.34, last checked during ED visit 1/29 - Cr- 2.1.  -Appears to be secondary to prerenal azotemia and dehydration. -Continue minimizing nephrotoxic agents and avoid hypotension -Continue fluid resuscitation and follow electrolytes/renal function trend -Creatinine trending down (currently 3.87). -Family interested on 24 more hours of IV fluids to further improve renal function and provide better condition prior to discharge. -Continue to follow clinical response.  FTT (failure to thrive) in adult -Presenting with weakness, falls, poor oral intake, resultant AKI and electrolyte abnormalities.   -Patient with concomitant deconditioning, generalized weakness and ongoing poor  appetite. -While discussing with patient and her son, decision for no artificial nutrition decided. -Continue to treat what is treatable and follow clinical response. Patient is DNR/DNI -Patient and family in agreement to go home with home health services and palliative care follow-up.   -Continue fluid resuscitation, continue encourage oral intake and feeding supplement; continue to follow magnesium and phosphorus level given concerns for refeeding syndrome..  Failure to thrive in adult -Follow electrolytes to avoid refeeding syndrome -Continue feeding supplements and encourage oral intake -Continue to maintain adequate hydration and once medically stable discharge home with home health services and palliative care.  Electrolyte abnormality -Hyponatremia sodium 133, hypokalemia 2.9.  Possibly high anion gap metabolic acidosis; Likely due to AKI.   -Sodium is now 136 and resolved; potassium 3.5 and bicarb 20. -Continue IV fluids and supportive care.  Essential hypertension -Stable overall -Continue to follow BP. -Continue holding the use of valsartan in the setting of acute on chronic renal failure.    Subjective:  Chronically ill in appearance; underweight and frail; reporting no nausea, no vomiting, no chest pain or shortness of breath.  Still with poor appetite.  Physical Exam: Vitals:   02/25/22 1358 02/25/22 1900 02/26/22 0446 02/26/22 1003  BP: 107/60 (!) 107/48 (!) 119/52 (!) 117/48  Pulse: 67 80 76 82  Resp: '16 16 15   '$ Temp: 98.2 F (36.8 C) 97.9 F (36.6 C) 97.7 F (36.5 C)   TempSrc: Oral Oral Axillary   SpO2: 97% 98% 100%   Weight:      Height:       General exam: Alert, awake, oriented x  3; no chest pain, no nausea or vomiting.  Reporting poor appetite. Respiratory system: Clear to auscultation. Respiratory effort normal.  Good saturation on room air. Cardiovascular system:RRR. No rubs or gallops; no JVD. Gastrointestinal system: Abdomen is nondistended, soft  and nontender. No organomegaly or masses felt. Normal bowel sounds heard. Central nervous system: Alert and oriented. No focal neurological deficits. Extremities: No cyanosis or clubbing. Skin: No petechiae. Psychiatry: Mood and affect appropriate.  Data Reviewed: Urinalysis: Demonstrating moderate leukocyte esterase; negative nitrite, specific gravity of 0.712 Basic metabolic panel: Sodium 197, potassium 3.5, chloride 105, bicarb 20, BUN 65, creatinine 3.87, anion gap 11 and GFR 10.  Family Communication: No family at bedside at time of evaluation; son updated on 02/25/2022.  Disposition: Status is: Inpatient Remains inpatient appropriate because: Continue IV fluids; continue advancing diet and follow electrolytes/renal function trend.   Planned Discharge Destination: Home with Home Health  Time spent: 35 minutes  Author: Barton Dubois, MD 02/26/2022 12:50 PM  For on call review [email or web address].

## 2022-02-26 NOTE — Progress Notes (Signed)
OT Cancellation Note  Patient Details Name: [name] MRN: [id number] DOB: Mar 28, 1922   Cancelled Treatment:    Reason Eval/Treat Not Completed: Patient declined, no reason specified. Pt declined therapy today without any specific reason. Reported she did not want to.   Kerrigan Glendening OT, MOT   Larey Seat 02/26/2022, 8:27 AM

## 2022-02-26 NOTE — Progress Notes (Signed)
Palliative:  Mrs. Haskins is sitting quietly in the [place] chair in her room.  She appears acutely/chronically ill and somewhat frail, elderly.  She is resting comfortably but wakes easily when I speak to her.  She will briefly make but not keep eye contact.  She is able to make her basic needs known.  Her son, Alyzah, Pelly., is present at bedside.  I offer Mrs. Midkiff something to drink.  She is able to drink from her strawberry Ensure without overt signs and symptoms of aspiration.  She denies any needs.  Mr. Nappi and I talk about disposition and services.  He states that he would like to bring his mother home tomorrow.  He will transport her home in a private vehicle.  We talk about home with home health services and outpatient palliative services.  He tells me that he currently employs a worker 3 times a week to help Mrs. Henson with bathing.  Conference with attending, bedside nursing staff, transition of care team related to patient condition, needs, goals of care, disposition.  Plan: At this point continue to treat the treatable.  Home with home health services.  Outpatient palliative services with hosp[facility].  Considering CODE STATUS, son to review advanced directives completed in '09.  37 minutes Quinn Axe, NP Palliative Medicine Team  Team phone [phone] Great that 50%

## 2022-02-27 DIAGNOSIS — N179 Acute kidney failure, unspecified: Secondary | ICD-10-CM | POA: Diagnosis not present

## 2022-02-27 DIAGNOSIS — Z7189 Other specified counseling: Secondary | ICD-10-CM | POA: Diagnosis not present

## 2022-02-27 DIAGNOSIS — Z515 Encounter for palliative care: Secondary | ICD-10-CM | POA: Diagnosis not present

## 2022-02-27 DIAGNOSIS — R627 Adult failure to thrive: Secondary | ICD-10-CM | POA: Diagnosis not present

## 2022-02-27 LAB — BASIC METABOLIC PANEL
Anion gap: 13 (ref 5–15)
BUN: 58 mg/dL — ABNORMAL HIGH (ref 8–23)
CO2: 19 mmol/L — ABNORMAL LOW (ref 22–32)
Calcium: 8.1 mg/dL — ABNORMAL LOW (ref 8.9–10.3)
Chloride: 104 mmol/L (ref 98–111)
Creatinine, Ser: 3.69 mg/dL — ABNORMAL HIGH (ref 0.44–1.00)
GFR, Estimated: 11 mL/min — ABNORMAL LOW (ref >60)
Glucose, Bld: 92 mg/dL (ref 70–99)
Potassium: 3.8 mmol/L (ref 3.5–5.1)
Sodium: 136 mmol/L (ref 135–145)

## 2022-02-27 MED ORDER — SODIUM CHLORIDE 0.9 % IV SOLN: INTRAVENOUS | Status: AC

## 2022-02-27 NOTE — Progress Notes (Signed)
Progress Note   Patient: [name] ERD:[id number] DOB: 05-14-1922 DOA: 02/24/2022     2 DOS: the patient was seen and examined on 02/27/2022   Brief hospital course: As per H&P written by Dr. Denton James on 02/24/2022 [name] is a 89 y.o. female with medical history significant for CKD 3, falls, hypertension. Patient was brought to the ED reports of reduced oral intake, and not doing her normal activities.  On my evaluation, patient is alone: Patient is able to answer questions appropriately.  She tells me she has been weak and falling, with poor appetite.  Denies pain.  No cough, no difficulty breathing.   Patient was in the ED 1/29-5 syncopal episode, patient was riding in a car with her son when she slumped over.  Patient was thought to be dehydrated, troponins mildly elevated.  Patient did not want to be admitted, hence patient was discharged home.   ED Course: Temperature 98.1.  Heart rate 83-90.  Blood pressure 113-121. Cr elevated at 4.34, last checked during ED visit 1/29 - Cr- 2.1. baseline- 1.2-1.6.  Potassium 2.9.  Magnesium 2.4.  TSH normal 1.46. 2 L bolus given. Hospitalist to admit for failure to thrive.  Assessment and Plan: 1)-AKI on CKD stage IIIb.   On admission Cr elevated at 4.34,  last checked during ED visit 02/18/2022 - Cr- 2.1.  -Appears to be secondary to prerenal azotemia and dehydration. -Continue minimizing nephrotoxic agents and avoid hypotension 02/27/2022 -Creatinine down to 3.69 (4.34 >>3.94 >>3.87 >>3.69) -Continue gentle iv hydration  - renally adjust medications, avoid nephrotoxic agents / dehydration  / hypotension  2)FTT (failure to thrive) in adult -Presenting with weakness, falls, poor oral intake, resultant AKI and electrolyte abnormalities.   -Patient with concomitant deconditioning, generalized weakness and ongoing poor appetite. -Continue to encourage oral intake -Continue IV fluids as above   3)Generalized weakness and  deconditioning--patient refused physical therapy eval -Anticipate she will need home health services  4)Social/Ethics--  -Palliative care consult appreciated -Remains a full code at this time  5) hypokalemia/hyponatremia--- resolved with hydration and replacement -Metabolic acidosis persist =-Continue IV fluids as above  6)HTN-stable, continue amlodipine -Valsartan on hold due to kidney concerns  7) hypothyroidism--- continue levothyroxine    Subjective:  -Oral intake is not great -No vomiting or diarrhea -Voiding okay  Physical Exam: Vitals:   02/26/22 1003 02/26/22 1319 02/26/22 1952 02/27/22 0430  BP: (!) 117/48 (!) 119/55 (!) 112/52 (!) 114/50  Pulse: 82 87 82 77  Resp:  '16 18 18  '$ Temp:  98.8 F (37.1 C) 98.7 F (37.1 C) 98.8 F (37.1 C)  TempSrc:  Oral Oral Oral  SpO2:  99% 99% 99%  Weight:      Height:        Physical Exam  Gen:- Awake Alert, in no acute distress ,  HEENT:- [place].AT, No sclera icterus Neck-Supple Neck,No JVD,.  Lungs-  CTAB , fair air movement bilaterally  CV- S1, S2 normal, RRR Abd-  +ve B.Sounds, Abd Soft, No tenderness, No CVA tenderness   Extremity/Skin:- No  edema,   good pedal pulses  Psych-affect is appropriate, oriented x3 Neuro-generalized weakness and deconditioning, no new focal deficits, no tremors    Family Communication: No family at bedside at time of evaluation; son previously updated   Disposition:home with [facility] Status is: Inpatient Remains inpatient appropriate because: Continue IV fluids; continue advancing diet and follow electrolytes/renal function trend.   Planned Discharge Destination: Home with Home Health  Author: Tywon James  Jessica Brick, MD 02/27/2022 12:17 PM  For on call review [email or web address].

## 2022-02-27 NOTE — Progress Notes (Signed)
Palliative: [name] is resting quietly in the [place] chair in her room.  She appears chronically ill and frail, elderly.  She is resting comfortably but wakes easily when I call her name.  She is alert, oriented to self.  She is able to make her needs known.  [name] is able to take a sip of coffee without overt signs or symptoms of aspiration.  There is no family at bedside.  Bedside nursing staff is present attending to needs.  [name]' discharge is being pushed out until tomorrow.  Face to face meeting with transition of care and bedside nursing staff related to patient condition, needs, goals of care, disposition.  Plan:  At this point, continue to treat the treatable.  Home with home health services.  Outpatient palliative services with hospice of [facility].  Considering CODE STATUS, son to review advanced directives completed in '09.     25 minutes  Jessica Axe, NP Palliative medicine team Team phone [phone] Greater than 50% of this time was spent counseling and coordinating care related to the above assessment and plan.

## 2022-02-28 ENCOUNTER — Ambulatory Visit: Payer: PPO | Admitting: Neurology

## 2022-02-28 DIAGNOSIS — Z515 Encounter for palliative care: Secondary | ICD-10-CM | POA: Diagnosis not present

## 2022-02-28 DIAGNOSIS — N179 Acute kidney failure, unspecified: Secondary | ICD-10-CM | POA: Diagnosis not present

## 2022-02-28 DIAGNOSIS — Z7189 Other specified counseling: Secondary | ICD-10-CM | POA: Diagnosis not present

## 2022-02-28 DIAGNOSIS — R627 Adult failure to thrive: Secondary | ICD-10-CM | POA: Diagnosis not present

## 2022-02-28 LAB — BASIC METABOLIC PANEL
Anion gap: 9 (ref 5–15)
BUN: 48 mg/dL — ABNORMAL HIGH (ref 8–23)
CO2: 21 mmol/L — ABNORMAL LOW (ref 22–32)
Calcium: 8.2 mg/dL — ABNORMAL LOW (ref 8.9–10.3)
Chloride: 107 mmol/L (ref 98–111)
Creatinine, Ser: 3.1 mg/dL — ABNORMAL HIGH (ref 0.44–1.00)
GFR, Estimated: 13 mL/min — ABNORMAL LOW (ref >60)
Glucose, Bld: 116 mg/dL — ABNORMAL HIGH (ref 70–99)
Potassium: 4 mmol/L (ref 3.5–5.1)
Sodium: 137 mmol/L (ref 135–145)

## 2022-02-28 MED ORDER — SODIUM CHLORIDE 0.9 % IV SOLN: INTRAVENOUS | Status: AC

## 2022-02-28 MED ORDER — MEGESTROL ACETATE 400 MG/10ML PO SUSP
400.0000 mg | Freq: Every day | ORAL | Status: DC
  Administered 2022-02-28 – 2022-03-01 (×2): 400 mg via ORAL
  Filled 2022-02-28 (×2): qty 10

## 2022-02-28 NOTE — TOC Progression Note (Signed)
Transition of Care [facility]) - Progression Note    Patient Details  Name: [name] MRN: [id number] Date of Birth: 09/19/1922  Transition of Care [facility]) CM/SW Contact  Shade Flood, LCSW Phone Number: 02/28/2022, 2:50 PM  Clinical Narrative:     TOC following. Pt's son informed Palliative Care APNP that he would like to take pt home with hospice tomorrow. He reported preference for [facility]   Referral made to [facility] [place].   Will follow up in AM.  Expected Discharge Plan: Home w Hospice Care Barriers to Discharge: Continued Medical Work up  Expected Discharge Plan and Services In-house Referral: Clinical Social Work   Post Acute Care Choice: Hospice Living arrangements for the past 2 months: [place]: PT [place]: [place] Date [place]: 02/25/22 Time [place]: [place] Representative spoke with at [place]: [place] Determinants of Health ([place]) Interventions SDOH Screenings   Food Insecurity: No Food Insecurity (02/24/2022)  Housing: Low Risk  (02/24/2022)  Transportation Needs: No Transportation Needs (02/24/2022)  Utilities: Not At Risk (02/24/2022)  Alcohol Screen: Low Risk  (02/22/2021)  Depression (PHQ2-9): Low Risk  (02/07/2022)  Financial Resource Strain: Low Risk  (12/28/2021)  Physical Activity: Inactive (02/22/2021)  Social Connections: Moderately Integrated (02/22/2021)  Stress: No Stress Concern Present (02/22/2021)  Tobacco Use: Low Risk  (02/25/2022)    Readmission Risk Interventions     No data to display

## 2022-02-28 NOTE — Care Management Important Message (Signed)
Important Message  Patient Details  Name: [name] MRN: [id number] Date of Birth: 1922/12/22   Medicare Important Message Given:  Yes     [name] 02/28/2022, 12:00 PM

## 2022-02-28 NOTE — Progress Notes (Signed)
Palliative: [name] is resting quietly in bed.  She appears acutely/chronically ill and somewhat frail.  She is alert, and will make an somewhat keep eye contact.  She has known memory loss, but I believe that she can make her basic needs known.  Her son, Jahlisa, [place]., is present at bedside, along with Dr. Joesph Fillers.    We talk about the benefits of hospice at home.  Hessie Diener. tells life stories of his mother in laws passing in November with hospice.  He also tells of his fathers passing on the hospital.  Raed tells a story of decline after [name] 98[age] birthday.  I encouraged Ray to review [name] advanced directives.  DNR recommended.  Plan: Home with the benefits of hospice of [facility].  Anticipate discharge 2/9, home via private vehicle.  Denies equipment needs.  34 minutes  Quinn Axe, NP  Palliative Medicine Team  Team Phone [phone] Greater than 50% of this time was spent counseling and coordinating care related to the above assessment and plan.

## 2022-02-28 NOTE — Progress Notes (Signed)
Progress Note   Patient: [name] EPP:[id number] DOB: Jun 24, 1922 DOA: 02/24/2022     3 DOS: the patient was seen and examined on 02/28/2022   Brief hospital course: As per H&P written by Dr. Denton Brick on 02/24/2022 [name] is a 89 y.o. female with medical history significant for CKD 3, falls, hypertension. Patient was brought to the ED reports of reduced oral intake, and not doing her normal activities.  On my evaluation, patient is alone: Patient is able to answer questions appropriately.  She tells me she has been weak and falling, with poor appetite.  Denies pain.  No cough, no difficulty breathing.   Patient was in the ED 1/29-5 syncopal episode, patient was riding in a car with her son when she slumped over.  Patient was thought to be dehydrated, troponins mildly elevated.  Patient did not want to be admitted, hence patient was discharged home.   ED Course: Temperature 98.1.  Heart rate 83-90.  Blood pressure 113-121. Cr elevated at 4.34, last checked during ED visit 1/29 - Cr- 2.1. baseline- 1.2-1.6.  Potassium 2.9.  Magnesium 2.4.  TSH normal 1.46. 2 L bolus given. Hospitalist to admit for failure to thrive.  Assessment and Plan: 1)-AKI on CKD stage IIIb.   On admission Cr elevated at 4.34,  last checked during ED visit 02/18/2022 - Cr- 2.1.  -Appears to be secondary to prerenal azotemia and dehydration. -Continue minimizing nephrotoxic agents and avoid hypotension 02/28/2022 -Creatinine down  (4.34 >>3.94 >>3.87 >>3.69> 3.10 -Continue gentle iv hydration especially given very very poor oral intake - renally adjust medications, avoid nephrotoxic agents / dehydration  / hypotension  2)FTT (failure to thrive) in adult -Presenting with weakness, falls, poor oral intake, resultant AKI and electrolyte abnormalities.   -Patient with concomitant deconditioning, generalized weakness and ongoing poor appetite. Sadly oral intake remains very very poor -Continue IV fluids as  above -Give Megace for appetite stimulation   3)Generalized weakness and deconditioning--patient refused physical therapy eval -Anticipate she will need home health services  4)Social/Ethics-- 02/28/22 conference at bedside with patient's son and palliative care provider -Palliative care consult appreciated -Remains a full code at this time -Tentatively anticipate discharge home on 03/01/2022 with home health services and palliative care services -Overall prognosis is poor due to poor oral intake may need to transition to hospice if oral intake remains poor as dehydration and worsening renal function in setting of poor oral intake will become more of an issue  5) hypokalemia/hyponatremia--- resolved with hydration and replacement -Metabolic acidosis persist =-Continue IV fluids as above  6)HTN-stable, continue amlodipine -Valsartan on hold due to kidney concerns  7) hypothyroidism--- continue levothyroxine    Subjective:  --Patient's son at bedside -Oral intake remains very very challenging -Patient sleeps on and off  Physical Exam: Vitals:   02/27/22 1249 02/27/22 2030 02/27/22 2035 02/28/22 0433  BP: 127/69 (!) 125/54  134/62  Pulse: 93 78  76  Resp: '16 16 17 16  '$ Temp: 98.2 F (36.8 C) 98.6 F (37 C)  98.4 F (36.9 C)  TempSrc: Oral Axillary  Axillary  SpO2: 99% 99%  98%  Weight:      Height:        Physical Exam  Gen:-Sleepy on and off,  in no acute distress ,  HEENT:- [place].AT, No sclera icterus Neck-Supple Neck,No JVD,.  Lungs-  CTAB , fair air movement bilaterally  CV- S1, S2 normal, RRR Abd-  +ve B.Sounds, Abd Soft, No tenderness, No CVA tenderness  Extremity/Skin:- No  edema,   good pedal pulses  Psych-affect is appropriate, oriented x3 Neuro-generalized weakness and deconditioning, no new focal deficits, no tremors   Family Communication: Discussed with son at bedside  Disposition:home with HH/palliative care on 03/01/2022 Status is: Inpatient Remains  inpatient appropriate because: Continue IV fluids; continue advancing diet and follow electrolytes/renal function trend.   Planned Discharge Destination: Home with Home Health  Author: Roxan Hockey, MD 02/28/2022 5:56 PM  For on call review [email or web address].

## 2022-03-01 ENCOUNTER — Telehealth: Payer: Self-pay | Admitting: Internal Medicine

## 2022-03-01 DIAGNOSIS — N179 Acute kidney failure, unspecified: Secondary | ICD-10-CM | POA: Diagnosis not present

## 2022-03-01 LAB — BASIC METABOLIC PANEL
Anion gap: 11 (ref 5–15)
BUN: 39 mg/dL — ABNORMAL HIGH (ref 8–23)
CO2: 19 mmol/L — ABNORMAL LOW (ref 22–32)
Calcium: 8.4 mg/dL — ABNORMAL LOW (ref 8.9–10.3)
Chloride: 107 mmol/L (ref 98–111)
Creatinine, Ser: 2.85 mg/dL — ABNORMAL HIGH (ref 0.44–1.00)
GFR, Estimated: 14 mL/min — ABNORMAL LOW (ref >60)
Glucose, Bld: 118 mg/dL — ABNORMAL HIGH (ref 70–99)
Potassium: 3.6 mmol/L (ref 3.5–5.1)
Sodium: 137 mmol/L (ref 135–145)

## 2022-03-01 MED ORDER — MEGESTROL ACETATE 400 MG/10ML PO SUSP: 400.0000 mg | Freq: Every day | ORAL | 3 refills | Status: DC

## 2022-03-01 MED ORDER — ADULT MULTIVITAMIN W/MINERALS CH: 1.0000 | ORAL_TABLET | Freq: Every day | ORAL | 2 refills | Status: DC

## 2022-03-01 MED ORDER — PANTOPRAZOLE SODIUM 40 MG PO TBEC: 40.0000 mg | DELAYED_RELEASE_TABLET | Freq: Every day | ORAL | 1 refills | Status: DC

## 2022-03-01 MED ORDER — ENSURE ENLIVE PO LIQD: 237.0000 mL | Freq: Two times a day (BID) | ORAL | 12 refills | Status: DC

## 2022-03-01 NOTE — Discharge Instructions (Signed)
1)Avoid ibuprofen/Advil/Aleve/Motrin/Goody Powders/Naproxen/BC powders/Meloxicam/Diclofenac/Indomethacin and other Nonsteroidal anti-inflammatory medications as these will make you more likely to bleed and can cause stomach ulcers, can also cause Kidney problems.   2)Encourage food and fluid intake to avoid dehydration  3)Take Megestrol/Megace--- to help your appetite  4) consider transition to hospice--- especially if oral intake remains poor with risk for dehydration and malnutrition

## 2022-03-01 NOTE — Plan of Care (Signed)
  Problem: Education: Goal: Knowledge of General Education information will improve Description: Including pain rating scale, medication(s)/side effects and non-pharmacologic comfort measures Outcome: Adequate for Discharge   Problem: Health Behavior/Discharge Planning: Goal: Ability to manage health-related needs will improve Outcome: Adequate for Discharge   Problem: Clinical Measurements: Goal: Ability to maintain clinical measurements within normal limits will improve Outcome: Adequate for Discharge Goal: Will remain free from infection Outcome: Adequate for Discharge   Problem: Activity: Goal: Risk for activity intolerance will decrease Outcome: Adequate for Discharge   Problem: Nutrition: Goal: Adequate nutrition will be maintained Outcome: Adequate for Discharge   Problem: Elimination: Goal: Will not experience complications related to bowel motility Outcome: Adequate for Discharge Goal: Will not experience complications related to urinary retention Outcome: Adequate for Discharge   Problem: Safety: Goal: Ability to remain free from injury will improve Outcome: Adequate for Discharge

## 2022-03-01 NOTE — Care Management Important Message (Signed)
Important Message  Patient Details  Name: [name] MRN: [id number] Date of Birth: 02/10/1922   Medicare Important Message Given:  Yes     [name] 03/01/2022, 10:29 AM

## 2022-03-01 NOTE — Telephone Encounter (Signed)
[name], [place], [phone]   Wants to know if Dr. Posey Pronto will be the attending physician

## 2022-03-01 NOTE — Discharge Summary (Signed)
[name], is a 89 y.o. female  DOB [birth year]  MRN [id number].  Admission date:  02/24/2022  Admitting Physician  Barton Dubois, MD  Discharge Date:  03/01/2022   Primary MD  [name], MD  Recommendations for primary care physician for things to follow:   1)Avoid ibuprofen/Advil/Aleve/Motrin/Goody Powders/Naproxen/BC powders/Meloxicam/Diclofenac/Indomethacin and other Nonsteroidal anti-inflammatory medications as these will make you more likely to bleed and can cause stomach ulcers, can also cause Kidney problems.   2)Encourage food and fluid intake to avoid dehydration  3)Take Megestrol/Megace--- to help your appetite  4) consider transition to hospice--- especially if oral intake remains poor with risk for dehydration and malnutrition  Admission Diagnosis  Dehydration [E86.0] Failure to thrive in adult [R62.7] AKI (acute kidney injury) ([place]) [N17.9]   Discharge Diagnosis  Dehydration [E86.0] Failure to thrive in adult [R62.7] AKI (acute kidney injury) ([place]) [N17.9]   Principal Problem:   AKI (acute kidney injury) ([place]) Active Problems:   FTT (failure to thrive) in adult   Essential hypertension   Electrolyte abnormality   Failure to thrive in adult      Past Medical History:  Diagnosis Date   Adult BMI 19-24 kg/sq m 2008 125 lbs   Anemia    Pernicious 2008-HB 12 MCV 119.5; JAN 2012 HB 13.2 MCV 97.6   Carcinoid tumor of stomach 2008   Diverticulosis    GERD (gastroesophageal reflux disease)    Hiatal hernia    HTN (hypertension)    Hyperlipidemia    Hypertension    Phreesia 02/07/2020   Mild memory loss 02/15/2011   Near syncope 04/18/2014   Pernicious anemia    Pernicious anemia 06/14/2010   Wrist fracture, left 2008    Past Surgical History:  Procedure Laterality Date   COLONOSCOPY  2003 LS   DC/[place] Diverticulosis   EUS  2008 DJ   FNA NO CARCINOID   EUS  JAN 2009    NL EXAM, NO Bx   EUS  [facility] 2011 WO   CARCINOID   UPPER GASTROINTESTINAL ENDOSCOPY  APR 2008 [facility]   CARCIONOID   UPPER GASTROINTESTINAL ENDOSCOPY  FEB 2011 [facility]   CARCINOID/mild anemia/large hiatal hernia     HPI  from the history and physical done on the day of admission:    Chief Complaint: Poor Appetite   HPI: [name] is a 89 y.o. female with medical history significant for CKD 3, falls, hypertension. Patient was brought to the ED reports of reduced oral intake, and not doing her normal activities.  On my evaluation, patient is alone: Patient is able to answer questions appropriately.  She tells me she has been weak and falling, with poor appetite.  Denies pain.  No cough, no difficulty breathing.   Patient was in the ED 1/29-5 syncopal episode, patient was riding in a car with her son when she slumped over.  Patient was thought to be dehydrated, troponins mildly elevated.  Patient did not want to be admitted, hence patient was discharged home.  ED Course: Temperature 98.1.  Heart rate 83-90.  Blood pressure 113-121. Cr elevated at 4.34, last checked during ED visit 1/29 - Cr- 2.1. baseline- 1.2-1.6.  Potassium 2.9.  Magnesium 2.4.  TSH normal 1.46. 2 L bolus given. Hospitalist to admit for failure to thrive   Review of Systems: As per HPI all other systems reviewed and negative.    Hospital Course:     Assessment and Plan: 1)-AKI on CKD stage IIIb.   On admission Cr elevated at 4.34,  last checked during ED visit 02/18/2022 - Cr- 2.1.  -Appears to be secondary to prerenal azotemia and dehydration. -Continue minimizing nephrotoxic agents and avoid hypotension -Improved with IV fluids -Creatinine down  (4.34 >>3.94 >>3.87 >>3.69> 3.10 >>2.85 - renally adjust medications, avoid nephrotoxic agents / dehydration  / hypotension   2)FTT (failure to thrive) in adult -Presenting with weakness, falls, poor oral intake, resultant AKI and electrolyte abnormalities.   -Patient  with concomitant deconditioning, generalized weakness and ongoing poor appetite. Sadly oral intake remains very very poor -Treated with IV fluids -Give Megace for appetite stimulation    3)Generalized weakness and deconditioning--patient refused physical therapy eval -Anticipate she will need home health services   4)Social/Ethics-- conference at bedside with patient's son and palliative care provider -Palliative care consult appreciated -Remains a full code at this time -Overall prognosis is poor due to poor oral intake may need to transition to hospice if oral intake remains poor as dehydration and worsening renal function in setting of poor oral intake will become more of an issue   5) hypokalemia/hyponatremia--- treated with IV fluids, electrolyte abnormalities resolved with hydration and replacement - 6)HTN-stable, continue amlodipine -Discussed valsartan due to kidney concerns   7) hypothyroidism--- continue levothyroxine  8)PAD/Carotid Artery Stenosis--- continue aspirin and Plavix  9)GERD--continue Protonix   Discharge Condition: Stable  Follow UP   Follow-up Information     [facility] Follow up.   Why: Will contact you to schedule home health visits.                Consults obtained -palliative care  Diet and Activity recommendation:  As advised  Discharge Instructions    Discharge Instructions     Call MD for:  difficulty breathing, headache or visual disturbances   Complete by: As directed    Call MD for:  persistant dizziness or light-headedness   Complete by: As directed    Call MD for:  persistant nausea and vomiting   Complete by: As directed    Call MD for:  temperature >100.4   Complete by: As directed    Diet general   Complete by: As directed    Discharge instructions   Complete by: As directed    1)Avoid ibuprofen/Advil/Aleve/Motrin/Goody Powders/Naproxen/BC powders/Meloxicam/Diclofenac/Indomethacin and other Nonsteroidal  anti-inflammatory medications as these will make you more likely to bleed and can cause stomach ulcers, can also cause Kidney problems.   2)Encourage food and fluid intake to avoid dehydration  3)Take Megestrol/Megace--- to help your appetite  4) consider transition to hospice--- especially if oral intake remains poor with risk for dehydration and malnutrition   Increase activity slowly   Complete by: As directed        Discharge Medications     Allergies as of 03/01/2022       Reactions   Codeine         Medication List     STOP taking these medications    famotidine 10 MG tablet Commonly known  as: PEPCID   rosuvastatin 40 MG tablet Commonly known as: CRESTOR   valsartan 40 MG tablet Commonly known as: DIOVAN       TAKE these medications    acetaminophen 500 MG tablet Commonly known as: TYLENOL Take 500 mg by mouth every 6 (six) hours as needed for mild pain. For pain   amLODipine 10 MG tablet Commonly known as: NORVASC Take 1 tablet (10 mg total) by mouth daily.   aspirin EC 81 MG tablet Take 1 tablet (81 mg total) by mouth daily with breakfast.   clopidogrel 75 MG tablet Commonly known as: PLAVIX Take 1 tablet (75 mg total) by mouth daily. For 3 months with 81 mg aspirin   donepezil 5 MG tablet Commonly known as: ARICEPT TAKE 1 TABLET BY MOUTH AT BEDTIME   feeding supplement Liqd Take 237 mLs by mouth 2 (two) times daily between meals.   levothyroxine 25 MCG tablet Commonly known as: SYNTHROID TAKE 1 TABLET BY MOUTH ONCE DAILY BEFORE BREAKFAST   megestrol 400 MG/10ML suspension Commonly known as: MEGACE Take 10 mLs (400 mg total) by mouth daily. Start taking on: March 02, 2022   multivitamin with minerals Tabs tablet Take 1 tablet by mouth daily. Start taking on: March 02, 2022   pantoprazole 40 MG tablet Commonly known as: Protonix Take 1 tablet (40 mg total) by mouth daily.               Durable Medical Equipment   (From admission, onward)           Start     Ordered   02/25/22 1215  For home use only DME Walker rolling  Once       Comments: Patient unsteady on feet without AD, safer using RW with good return for use demonstrated  Question Answer Comment  Walker: With [place]   Patient needs a walker to treat with the following condition Gait difficulty      02/25/22 1215            Major procedures and Radiology Reports - PLEASE review detailed and final reports for all details, in brief -   DG CHEST PORT 1 VIEW  Result Date: 02/24/2022 CLINICAL DATA:  Failure to thrive. EXAM: PORTABLE CHEST 1 VIEW COMPARISON:  12/19/2021 FINDINGS: Heart size and pulmonary vascularity are normal. Emphysematous changes in the lungs. Diffuse fine interstitial pattern likely representing fibrosis. No airspace disease or consolidation. No pleural effusions. No pneumothorax. Mediastinal contours appear intact. Large esophageal hiatal hernia behind the heart. Calcification of the aorta. Calcified granulomas in the spleen. Degenerative changes in the spine and shoulders. IMPRESSION: Emphysematous changes and fibrosis in the lungs. Large esophageal hiatal hernia. No focal consolidation. Electronically Signed   By: Lucienne Capers M.D.   On: 02/24/2022 22:02    Micro Results   Recent Results (from the past 240 hour(s))  Resp panel by RT-PCR (RSV, Flu A&B, Covid) Anterior Nasal Swab     Status: None   Collection Time: 02/24/22  4:03 PM   Specimen: Anterior Nasal Swab  Result Value Ref Range Status   SARS Coronavirus 2 by RT PCR NEGATIVE NEGATIVE Final    Comment: (NOTE) SARS-CoV-2 target nucleic acids are NOT DETECTED.  The SARS-CoV-2 RNA is generally detectable in upper respiratory specimens during the acute phase of infection. The lowest concentration of SARS-CoV-2 viral copies this assay can detect is 138 copies/mL. A negative result does not preclude SARS-Cov-2 infection and should not be used  as  the sole basis for treatment or other patient management decisions. A negative result may occur with  improper specimen collection/handling, submission of specimen other than nasopharyngeal swab, presence of viral mutation(s) within the areas targeted by this assay, and inadequate number of viral copies(<138 copies/mL). A negative result must be combined with clinical observations, patient history, and epidemiological information. The expected result is Negative.  Fact Sheet for Patients:  EntrepreneurPulse.com.au  Fact Sheet for Healthcare Providers:  IncredibleEmployment.be  This test is no t yet approved or cleared by the [place] FDA and  has been authorized for detection and/or diagnosis of SARS-CoV-2 by FDA under an Emergency Use Authorization (EUA). This EUA will remain  in effect (meaning this test can be used) for the duration of the COVID-19 declaration under Section 564(b)(1) of the Act, 21 U.S.C.section 360bbb-3(b)(1), unless the authorization is terminated  or revoked sooner.       Influenza A by PCR NEGATIVE NEGATIVE Final   Influenza B by PCR NEGATIVE NEGATIVE Final    Comment: (NOTE) The Xpert Xpress SARS-CoV-2/FLU/RSV plus assay is intended as an aid in the diagnosis of influenza from Nasopharyngeal swab specimens and should not be used as a sole basis for treatment. Nasal washings and aspirates are unacceptable for Xpert Xpress SARS-CoV-2/FLU/RSV testing.  Fact Sheet for Patients: EntrepreneurPulse.com.au  Fact Sheet for Healthcare Providers: IncredibleEmployment.be  This test is not yet approved or cleared by the [place] FDA and has been authorized for detection and/or diagnosis of SARS-CoV-2 by FDA under an Emergency Use Authorization (EUA). This EUA will remain in effect (meaning this test can be used) for the duration of the COVID-19 declaration under Section 564(b)(1) of  the Act, 21 U.S.C. section 360bbb-3(b)(1), unless the authorization is terminated or revoked.     Resp Syncytial Virus by PCR NEGATIVE NEGATIVE Final    Comment: (NOTE) Fact Sheet for Patients: EntrepreneurPulse.com.au  Fact Sheet for Healthcare Providers: IncredibleEmployment.be  This test is not yet approved or cleared by the [place] FDA and has been authorized for detection and/or diagnosis of SARS-CoV-2 by FDA under an Emergency Use Authorization (EUA). This EUA will remain in effect (meaning this test can be used) for the duration of the COVID-19 declaration under Section 564(b)(1) of the Act, 21 U.S.C. section 360bbb-3(b)(1), unless the authorization is terminated or revoked.  Performed at [facility], [address], [place], [place] [address]    Today   Subjective    [name] today has no New complaints- - Eating and drinking this morning -More talkative/interactive   Patient has been seen and examined prior to discharge   Objective   Blood pressure 137/62, pulse 99, temperature 98.2 F (36.8 C), temperature source Oral, resp. rate 18, height 5' 1"$  (1.549 m), weight 40.6 kg, SpO2 93 %.   Intake/Output Summary (Last 24 hours) at 03/01/2022 1136 Last data filed at 03/01/2022 0415 Gross per 24 hour  Intake 1358.5 ml  Output 1175 ml  Net 183.5 ml    Exam Gen:-More awake, more interactive, in no acute distress ,  HEENT:- [place].AT, No sclera icterus Neck-Supple Neck,No JVD,.  Lungs-  CTAB , fair air movement bilaterally  CV- S1, S2 normal, RRR Abd-  +ve B.Sounds, Abd Soft, No tenderness, No CVA tenderness   Extremity/Skin:- No  edema,   good pedal pulses  Psych-affect is appropriate, oriented x3 Neuro-generalized weakness and deconditioning, no new focal deficits, no tremors   Data Review   CBC w Diff:  Lab Results  Component Value Date   WBC 8.0 02/25/2022   HGB 12.0 02/25/2022   HGB 13.7 02/07/2022   HCT  34.7 (L) 02/25/2022   HCT 40.8 02/07/2022   PLT 158 02/25/2022   PLT 204 02/07/2022   LYMPHOPCT 5 02/24/2022   MONOPCT 9 02/24/2022   EOSPCT 0 02/24/2022   BASOPCT 0 02/24/2022    CMP:  Lab Results  Component Value Date   NA 137 03/01/2022   NA 141 02/07/2022   K 3.6 03/01/2022   CL 107 03/01/2022   CO2 19 (L) 03/01/2022   BUN 39 (H) 03/01/2022   BUN 18 02/07/2022   CREATININE 2.85 (H) 03/01/2022   PROT 8.5 (H) 02/24/2022   PROT 7.3 02/07/2022   ALBUMIN 4.1 02/24/2022   ALBUMIN 4.2 02/07/2022   BILITOT 1.1 02/24/2022   BILITOT 0.4 02/07/2022   ALKPHOS 102 02/24/2022   AST 53 (H) 02/24/2022   ALT 21 02/24/2022  . Total Discharge time is about 33 minutes  Roxan Hockey M.D on 03/01/2022 at 11:36 AM  Go to [email or web address] -  for contact info  [facility] - Office  [phone]

## 2022-03-01 NOTE — Progress Notes (Signed)
PT Cancellation Note  Patient Details Name: [name] MRN: [id number] DOB: 1922/11/11   Cancelled Treatment:    Reason Eval/Treat Not Completed: Other (comment).  Patient put on hospice care for comfort measures only.  Patient discharged physical therapy to care of nursing for OOB daily as tolerated for length of stay.   9:20 AM, 03/01/22 Lonell Grandchild, MPT Physical Therapist with [facility] 336 [phone] office [phone] mobile phone

## 2022-03-01 NOTE — TOC Transition Note (Addendum)
Transition of Care [facility]) - CM/SW Discharge Note   Patient Details  Name: [name] MRN: [id number] Date of Birth: [birth year]  Transition of [facility] CM/SW Contact:  Shade Flood, LCSW Phone Number: 03/01/2022, 12:00 PM   Clinical Narrative:     Pt stable for dc home with hospice per MD. Updated Aldona Bar at [facility]. They will follow up with pt/family at home. Updated Lattie Haw at [facility] that they can cancel the [facility] referral.  There are no other TOC needs for dc.  Final next level of care: Home w Hospice Care Barriers to Discharge: Barriers Resolved   Patient Goals and CMS Choice CMS Medicare.gov Compare Post Acute Care list provided to:: Patient Represenative (must comment) Choice offered to / list presented to : Adult Children  Discharge Placement                         Discharge Plan and Services Additional resources added to the After Visit Summary for   In-house Referral: Clinical Social Work   Post Acute Care Choice: Hospice                    HH Arranged: PT [facility] Agency: Magazine Date [facility] Agency Contacted: 02/25/22 Time [place]: [place] Representative spoke with at [place]: [place] Determinants of Health ([place]) Interventions SDOH Screenings   Food Insecurity: No Food Insecurity (02/24/2022)  Housing: Low Risk  (02/24/2022)  Transportation Needs: No Transportation Needs (02/24/2022)  Utilities: Not At Risk (02/24/2022)  Alcohol Screen: Low Risk  (02/22/2021)  Depression (PHQ2-9): Low Risk  (02/07/2022)  Financial Resource Strain: Low Risk  (12/28/2021)  Physical Activity: Inactive (02/22/2021)  Social Connections: Moderately Integrated (02/22/2021)  Stress: No Stress Concern Present (02/22/2021)  Tobacco Use: Low Risk  (02/25/2022)     Readmission Risk Interventions     No data to display

## 2022-03-01 NOTE — Consult Note (Signed)
   [facility] Inpatient Consult   03/01/2022  [name] 1922/02/16 [id number]  [place] [facility] [ACO] Patient: [name] Hospital Liaison remote coverage review for patient admitted to [facility]  Primary Care Provider:  [name], MD with [facility] listed for the post hospital transition of care follow up   Patient screened for hospitalization with noted high risk score for unplanned readmission risk to assess for potential [place] Management service needs for post hospital transition for care coordination.  Review of patient's electronic medical record reveals patient has been outreach by [facility] RN CC and [place] in the past.  However, current electronic medical record review of inpatient [facility] team and Palliative consult notes reveals patient is for home with hospice care.  Plan:  No [facility] Care Coordination needs assessed as patient has transitioned to home hospice care with [facility] for post hospital care coordination noted per inpatient TOC LCSW notes.  Will sign off.  Of note, [facility] Care Management/Population Health does not replace or interfere with any arrangements made by the Inpatient Transition of Care team.  For questions contact:   Natividad Brood, RN BSN [place]  [phone] business mobile phone Toll free office [phone]  *[place]  [phone] Fax number: [phone] [email or web address]

## 2022-03-01 NOTE — Progress Notes (Signed)
OT Cancellation Note  Patient Details Name: [name] MRN: [id number] DOB: [birth year]   Cancelled Treatment:     Per social worker, pt is being placed in hospice care and receiving comfort measures only. Removing from OT list.  Frederic Jericho, OTR/L 03/01/2022, 9:16 AM

## 2022-03-04 ENCOUNTER — Telehealth: Payer: Self-pay | Admitting: *Deleted

## 2022-03-04 NOTE — Patient Outreach (Signed)
  Care Coordination TOC Note  Date of discharge and from where:  [facility] on 03/01/22  Chart reviewed for [facility] call and telephone encounter opened. Hospice was recommended due to declining health and poor oral intake but patient remained a full code at discharge. Telephone call from [facility] (hospice) [phone] to Dr Posey Pronto on 03/01/22 indicates that she has enrolled in hospice and he will be the attending physician. TOC follow-up not indicated since patient has been enrolled in hospice services.  Chong Sicilian, BSN, RN-BC RN Care Coordinator [place] Direct Dial: [phone] Main #: [phone]

## 2022-03-04 NOTE — Telephone Encounter (Signed)
[name] aware. °

## 2022-03-08 ENCOUNTER — Inpatient Hospital Stay: Payer: PPO

## 2022-03-13 ENCOUNTER — Other Ambulatory Visit: Payer: Self-pay | Admitting: Internal Medicine

## 2022-03-13 DIAGNOSIS — G301 Alzheimer's disease with late onset: Secondary | ICD-10-CM

## 2022-03-14 ENCOUNTER — Ambulatory Visit (INDEPENDENT_AMBULATORY_CARE_PROVIDER_SITE_OTHER): Payer: PPO

## 2022-03-14 DIAGNOSIS — Z0000 Encounter for general adult medical examination without abnormal findings: Secondary | ICD-10-CM | POA: Diagnosis not present

## 2022-03-14 NOTE — Patient Instructions (Signed)

## 2022-03-14 NOTE — Progress Notes (Signed)
Subjective:   [name] is a 89 y.o. female who presents for Medicare Annual (Subsequent) preventive examination.  Review of Systems    I connected with  [name] on 03/14/22 by a audio enabled telemedicine application and verified that I am speaking with the correct person using two identifiers.  Patient Location: Home  Provider Location: Office/Clinic  I discussed the limitations of evaluation and management by telemedicine. The patient expressed understanding and agreed to proceed.        Objective:    There were no vitals filed for this visit. There is no height or weight on file to calculate BMI.     02/24/2022    8:05 PM 02/24/2022    3:46 PM 01/22/2022   11:28 AM 12/19/2021   10:37 PM 12/19/2021    5:22 PM 12/19/2021    5:21 PM 12/12/2021    3:27 PM  Advanced Directives  Does Patient Have a Medical Advance Directive? Yes No Yes Yes Yes Yes Yes  Type of Paramedic of [place];Living will  [place];Living will [place];Living will [place];Living will Living will [place];Living will  Does patient want to make changes to medical advance directive? Yes (Inpatient - patient requests chaplain consult to change a medical advance directive)   No - Patient declined     Copy of [place] in Chart? No - copy requested  No - copy requested No - copy requested  No - copy requested No - copy requested  Would patient like information on creating a medical advance directive?   No - Patient declined        Current Medications (verified) Outpatient Encounter Medications as of 03/14/2022  Medication Sig   acetaminophen (TYLENOL) 500 MG tablet Take 500 mg by mouth every 6 (six) hours as needed for mild pain. For pain   amLODipine (NORVASC) 10 MG tablet Take 1 tablet (10 mg total) by mouth daily.   aspirin EC 81 MG tablet Take 1 tablet (81 mg total) by mouth  daily with breakfast.   clopidogrel (PLAVIX) 75 MG tablet Take 1 tablet (75 mg total) by mouth daily. For 3 months with 81 mg aspirin   donepezil (ARICEPT) 5 MG tablet TAKE 1 TABLET BY MOUTH AT BEDTIME   feeding supplement (ENSURE ENLIVE / ENSURE PLUS) LIQD Take 237 mLs by mouth 2 (two) times daily between meals.   levothyroxine (SYNTHROID) 25 MCG tablet TAKE 1 TABLET BY MOUTH ONCE DAILY BEFORE BREAKFAST   megestrol (MEGACE) 400 MG/10ML suspension Take 10 mLs (400 mg total) by mouth daily.   Multiple Vitamin (MULTIVITAMIN WITH MINERALS) TABS tablet Take 1 tablet by mouth daily.   pantoprazole (PROTONIX) 40 MG tablet Take 1 tablet (40 mg total) by mouth daily.   No facility-administered encounter medications on file as of 03/14/2022.    Allergies (verified) Codeine   History: Past Medical History:  Diagnosis Date   Adult BMI 19-24 kg/sq m 2008 125 lbs   Anemia    Pernicious 2008-HB 12 MCV 119.5; JAN 2012 HB 13.2 MCV 97.6   Carcinoid tumor of stomach 2008   Diverticulosis    GERD (gastroesophageal reflux disease)    Hiatal hernia    HTN (hypertension)    Hyperlipidemia    Hypertension    Phreesia 02/07/2020   Mild memory loss 02/15/2011   Near syncope 04/18/2014   Pernicious anemia    Pernicious anemia 06/14/2010  Wrist fracture, left 2008   Past Surgical History:  Procedure Laterality Date   COLONOSCOPY  2003 LS   DC/Campus Diverticulosis   EUS  2008 DJ   FNA NO CARCINOID   EUS  JAN 2009   NL EXAM, NO Bx   EUS  [facility] 2011 WO   CARCINOID   UPPER GASTROINTESTINAL ENDOSCOPY  APR 2008 SL[facility]   CARCIONOID   UPPER GASTROINTESTINAL ENDOSCOPY  FEB 2011 [facility]   CARCINOID/mild anemia/large hiatal hernia   Family History  Problem Relation Age of Onset   Cancer Mother    Heart disease Brother    Kidney disease Son    Colon cancer Neg Hx    Colon polyps Neg Hx    Social History   Socioeconomic History   Marital status: Widowed    Spouse name: Not on file   Number of children: 1    Years of education: 59   Highest education level: Not on file  Occupational History   Not on file  Tobacco Use   Smoking status: Never   Smokeless tobacco: Never  Vaping Use   Vaping Use: Never used  Substance and Sexual Activity   Alcohol use: No   Drug use: No   Sexual activity: Not Currently    Birth control/protection: Post-menopausal  Other Topics Concern   Not on file  Social History Narrative   Live with son Ray   Social Determinants of Health   Financial Resource Strain: Low Risk  (12/28/2021)   Overall Financial Resource Strain (CARDIA)    Difficulty of Paying Living Expenses: Not hard at all  Food Insecurity: No Food Insecurity (02/24/2022)   Hunger Vital Sign    Worried About Running Out of Food in the Last Year: Never true    [place] in the Last Year: Never true  Transportation Needs: No Transportation Needs (02/24/2022)   PRAPARE - Hydrologist (Medical): No    Lack of Transportation (Non-Medical): No  Physical Activity: Inactive (02/22/2021)   Exercise Vital Sign    Days of Exercise per Week: 0 days    Minutes of Exercise per Session: 0 min  Stress: No Stress Concern Present (02/22/2021)   [place]    Feeling of Stress : Not at all  Social Connections: Moderately Integrated (02/22/2021)   Social Connection and Isolation Panel [NHANES]    Frequency of Communication with Friends and Family: Three times a week    Frequency of Social Gatherings with Friends and Family: More than three times a week    Attends Religious Services: More than 4 times per year    Active Member of [employer] or Organizations: Yes    Attends Archivist Meetings: Never    Marital Status: Widowed    Tobacco Counseling Counseling given: Not Answered   Clinical Intake:                 Diabetic?No         Activities of Daily Living    02/24/2022    8:05 PM 12/19/2021    10:37 PM  In your present state of health, do you have any difficulty performing the following activities:  Hearing? 0 1  Vision? 0 0  Difficulty concentrating or making decisions? 0 0  Walking or climbing stairs? 1 0  Dressing or bathing? 1 0  Doing errands, shopping? 0 1    Patient Care Team: Ihor Dow  K, MD as PCP - General (Internal Medicine) O'Neal, Cassie Freer, MD as PCP - Cardiology (Cardiology) Danie Binder, MD (Inactive) (Gastroenterology)  Indicate any recent Medical Services you may have received from other than [facility] providers in the past year (date may be approximate).     Assessment:   This is a routine wellness examination for [place].  Hearing/Vision screen No results found.  Dietary issues and exercise activities discussed:     Goals Addressed   None    Depression Screen    02/07/2022   10:00 AM 12/25/2021    3:31 PM 10/19/2021   10:08 AM 06/28/2021   11:10 AM 06/20/2021    8:55 AM 02/22/2021    3:07 PM 02/05/2021   10:31 AM  PHQ 2/9 Scores  PHQ - 2 Score 0 1 0 0 0 0 0  PHQ- 9 Score  3         Fall Risk    02/07/2022   10:00 AM 12/25/2021    3:30 PM 10/19/2021   10:08 AM 06/28/2021   11:10 AM 06/20/2021    8:55 AM  Fall Risk   Falls in the past year? 0 1 0 1 1  Number falls in past yr: 0 0 0 1 1  Injury with Fall? 0 0 0 1 1  Risk for fall due to :    History of fall(s);Impaired balance/gait History of fall(s);Impaired balance/gait  Follow up  Falls evaluation completed  Falls evaluation completed;Education provided Falls evaluation completed;Education provided    FALL RISK PREVENTION PERTAINING TO THE HOME:  Any stairs in or around the home? No  If so, are there any without handrails? No  Home free of loose throw rugs in walkways, pet beds, electrical cords, etc? Yes  Adequate lighting in your home to reduce risk of falls? Yes   ASSISTIVE DEVICES UTILIZED TO PREVENT FALLS:  Life alert? No  Use of a cane, walker or w/c? No  Grab bars  in the bathroom? Yes  Shower chair or bench in shower? Yes  Elevated toilet seat or a handicapped toilet? Yes   TIMED UP AND GO:  Was the test performed? No .  Length of time to ambulate 10 feet:  sec.     Cognitive Function:        02/22/2021    3:15 PM 02/18/2020    9:37 AM  6CIT Screen  What Year? 0 points 0 points  What month? 0 points 0 points  What time? 0 points 0 points  Count back from 20 0 points 0 points  Months in reverse 2 points 0 points  Repeat phrase 2 points 0 points  Total Score 4 points 0 points    Immunizations Immunization History  Administered Date(s) Administered   Fluad Quad(high Dose 65+) 12/20/2021   Influenza, High Dose Seasonal PF 11/12/2017   Influenza-Unspecified 10/23/2016, 10/21/2017, 01/13/2021   PFIZER(Purple Top)SARS-COV-2 Vaccination 03/18/2019, 04/14/2019   PNEUMOCOCCAL CONJUGATE-20 02/05/2021   Pfizer Covid-19 Vaccine Bivalent Booster 54yr & up 01/20/2021, 03/21/2021   Zoster Recombinat (Shingrix) 02/19/2021, 04/23/2021    TDAP status: Due, Education has been provided regarding the importance of this vaccine. Advised may receive this vaccine at local pharmacy or Health Dept. Aware to provide a copy of the vaccination record if obtained from local pharmacy or Health Dept. Verbalized acceptance and understanding.  Flu Vaccine status: Up to date  Pneumococcal vaccine status: Up to date  Covid-19 vaccine status: Completed vaccines  Qualifies for Shingles Vaccine? Yes  Zostavax completed Yes   Shingrix Completed?: Yes  Screening Tests Health Maintenance  Topic Date Due   DTaP/Tdap/Td (1 - Tdap) Never done   DEXA SCAN  Never done   COVID-19 Vaccine (5 - 2023-24 season) 09/21/2021   HEMOGLOBIN A1C  04/26/2022   Medicare Annual Wellness (AWV)  07/13/2022   Pneumonia Vaccine 61+ Years old  Completed   Zoster Vaccines- Shingrix  Completed   HPV VACCINES  Aged Out   FOOT EXAM  Discontinued   OPHTHALMOLOGY EXAM  Discontinued     Health Maintenance  Health Maintenance Due  Topic Date Due   DTaP/Tdap/Td (1 - Tdap) Never done   DEXA SCAN  Never done   COVID-19 Vaccine (5 - 2023-24 season) 09/21/2021    Colorectal cancer screening: No longer required.   Mammogram status: No longer required due to age.  Bone Density-Declined  Lung Cancer Screening: (Low Dose CT Chest recommended if Age 36-80 years, 30 pack-year currently smoking OR have quit w/in 15years.) does not qualify.   Lung Cancer Screening Referral:   Additional Screening:  Hepatitis C Screening: does qualify; Completed   Vision Screening: Recommended annual ophthalmology exams for early detection of glaucoma and other disorders of the eye. Is the patient up to date with their annual eye exam?  No  Who is the provider or what is the name of the office in which the patient attends annual eye exams? N/a If pt is not established with a provider, would they like to be referred to a provider to establish care? No .   Dental Screening: Recommended annual dental exams for proper oral hygiene  Community Resource Referral / Chronic Care Management: CRR required this visit?  No   CCM required this visit?  No      Plan:     I have personally reviewed and noted the following in the patient's chart:   Medical and social history Use of alcohol, tobacco or illicit drugs  Current medications and supplements including opioid prescriptions. Patient is not currently taking opioid prescriptions. Functional ability and status Nutritional status Physical activity Advanced directives List of other physicians Hospitalizations, surgeries, and ER visits in previous 12 months Vitals Screenings to include cognitive, depression, and falls Referrals and appointments  In addition, I have reviewed and discussed with patient certain preventive protocols, quality metrics, and best practice recommendations. A written personalized care plan for preventive  services as well as general preventive health recommendations were provided to patient.     Orson Ape, [place]   03/14/2022   Nurse Notes:  Ms. Grimshaw , Thank you for taking time to come for your Medicare Wellness Visit. I appreciate your ongoing commitment to your health goals. Please review the following plan we discussed and let me know if I can assist you in the future.   These are the goals we discussed:  Goals   None     This is a list of the screening recommended for you and due dates:  Health Maintenance  Topic Date Due   DTaP/Tdap/Td vaccine (1 - Tdap) Never done   DEXA scan (bone density measurement)  Never done   COVID-19 Vaccine (5 - 2023-24 season) 09/21/2021   Hemoglobin A1C  04/26/2022   Medicare Annual Wellness Visit  03/15/2023   Pneumonia Vaccine  Completed   Zoster (Shingles) Vaccine  Completed   HPV Vaccine  Aged Out   Complete foot exam   Discontinued   Eye exam for diabetics  Discontinued

## 2022-03-15 ENCOUNTER — Ambulatory Visit: Payer: PPO | Admitting: Physician Assistant

## 2022-03-20 ENCOUNTER — Other Ambulatory Visit: Payer: Self-pay

## 2022-03-20 DIAGNOSIS — I10 Essential (primary) hypertension: Secondary | ICD-10-CM

## 2022-03-20 MED ORDER — AMLODIPINE BESYLATE 10 MG PO TABS: 10.0000 mg | ORAL_TABLET | Freq: Every day | ORAL | 1 refills | Status: DC

## 2022-03-26 ENCOUNTER — Telehealth: Payer: Self-pay | Admitting: Internal Medicine

## 2022-03-26 NOTE — Telephone Encounter (Signed)
Placard  Copied Noted sleeved

## 2022-03-28 NOTE — Telephone Encounter (Signed)
Called patient no answer form is ready for pick up.

## 2022-04-01 ENCOUNTER — Telehealth: Payer: Self-pay | Admitting: Internal Medicine

## 2022-04-01 ENCOUNTER — Other Ambulatory Visit: Payer: Self-pay

## 2022-04-01 MED ORDER — PANTOPRAZOLE SODIUM 40 MG PO TBEC: 40.0000 mg | DELAYED_RELEASE_TABLET | Freq: Every day | ORAL | 1 refills | Status: DC

## 2022-04-01 NOTE — Telephone Encounter (Signed)
Refills sent, karen advised

## 2022-04-01 NOTE — Telephone Encounter (Signed)
Santiago Glad, [place], [phone]  Wants to know if she can please get a refill on pantoprazole (PROTONIX) 40 MG tablet ?     [employer]

## 2022-04-29 ENCOUNTER — Other Ambulatory Visit: Payer: Self-pay

## 2022-04-29 DIAGNOSIS — I10 Essential (primary) hypertension: Secondary | ICD-10-CM

## 2022-04-29 MED ORDER — CLOPIDOGREL BISULFATE 75 MG PO TABS: 75.0000 mg | ORAL_TABLET | Freq: Every day | ORAL | 4 refills | Status: DC

## 2022-04-29 MED ORDER — AMLODIPINE BESYLATE 10 MG PO TABS: 10.0000 mg | ORAL_TABLET | Freq: Every day | ORAL | 4 refills | Status: DC

## 2022-05-13 ENCOUNTER — Other Ambulatory Visit: Payer: Self-pay | Admitting: Internal Medicine

## 2022-05-13 ENCOUNTER — Telehealth: Payer: Self-pay

## 2022-05-14 NOTE — Telephone Encounter (Signed)
Karen advised

## 2022-05-15 ENCOUNTER — Telehealth: Payer: Self-pay | Admitting: Internal Medicine

## 2022-05-15 NOTE — Telephone Encounter (Signed)
Clydie Braun called from [facility] to give update epsidoe said Monday passed out and came to see patient everything is okay but had edema to feet and ankle. Today her vitals 139/78 pulse 90 resp 18 temp 99.5. Also had some swelling 1+ to patient ankles. Clydie Braun call back # [phone]

## 2022-05-27 ENCOUNTER — Other Ambulatory Visit: Payer: Self-pay

## 2022-05-27 ENCOUNTER — Telehealth: Payer: Self-pay | Admitting: Internal Medicine

## 2022-05-27 DIAGNOSIS — M79675 Pain in left toe(s): Secondary | ICD-10-CM | POA: Diagnosis not present

## 2022-05-27 DIAGNOSIS — M79674 Pain in right toe(s): Secondary | ICD-10-CM | POA: Diagnosis not present

## 2022-05-27 DIAGNOSIS — L600 Ingrowing nail: Secondary | ICD-10-CM | POA: Diagnosis not present

## 2022-05-27 DIAGNOSIS — E039 Hypothyroidism, unspecified: Secondary | ICD-10-CM

## 2022-05-27 DIAGNOSIS — B351 Tinea unguium: Secondary | ICD-10-CM | POA: Diagnosis not present

## 2022-05-27 MED ORDER — LEVOTHYROXINE SODIUM 25 MCG PO TABS: 25.0000 ug | ORAL_TABLET | Freq: Every day | ORAL | 0 refills | Status: DC

## 2022-05-27 NOTE — Telephone Encounter (Signed)
Refill sent.

## 2022-05-27 NOTE — Telephone Encounter (Signed)
[name] from [facility] called in regards to refills on med's  levothyroxine (SYNTHROID) 25 MCG tablet [[id number]]    Sent to [facility].   Last seen in office on 02/07/2022 for CPE

## 2022-06-10 ENCOUNTER — Ambulatory Visit: Payer: PPO | Admitting: Internal Medicine

## 2022-06-18 ENCOUNTER — Other Ambulatory Visit: Payer: Self-pay

## 2022-06-18 ENCOUNTER — Encounter (HOSPITAL_COMMUNITY): Payer: Self-pay | Admitting: *Deleted

## 2022-06-18 ENCOUNTER — Emergency Department (HOSPITAL_COMMUNITY)

## 2022-06-18 ENCOUNTER — Emergency Department (HOSPITAL_COMMUNITY)
Admission: EM | Admit: 2022-06-18 | Discharge: 2022-06-18 | Disposition: A | Discharge status: Home / Self Care | Attending: Emergency Medicine | Admitting: Emergency Medicine

## 2022-06-18 DIAGNOSIS — K449 Diaphragmatic hernia without obstruction or gangrene: Secondary | ICD-10-CM | POA: Diagnosis not present

## 2022-06-18 DIAGNOSIS — Z85020 Personal history of malignant carcinoid tumor of stomach: Secondary | ICD-10-CM | POA: Insufficient documentation

## 2022-06-18 DIAGNOSIS — R944 Abnormal results of kidney function studies: Secondary | ICD-10-CM | POA: Diagnosis not present

## 2022-06-18 DIAGNOSIS — I10 Essential (primary) hypertension: Secondary | ICD-10-CM | POA: Diagnosis not present

## 2022-06-18 DIAGNOSIS — Z7982 Long term (current) use of aspirin: Secondary | ICD-10-CM | POA: Diagnosis not present

## 2022-06-18 DIAGNOSIS — R410 Disorientation, unspecified: Secondary | ICD-10-CM | POA: Diagnosis not present

## 2022-06-18 DIAGNOSIS — R32 Unspecified urinary incontinence: Secondary | ICD-10-CM | POA: Diagnosis not present

## 2022-06-18 DIAGNOSIS — Z7902 Long term (current) use of antithrombotics/antiplatelets: Secondary | ICD-10-CM | POA: Insufficient documentation

## 2022-06-18 DIAGNOSIS — R0902 Hypoxemia: Secondary | ICD-10-CM | POA: Diagnosis not present

## 2022-06-18 DIAGNOSIS — R55 Syncope and collapse: Secondary | ICD-10-CM | POA: Diagnosis not present

## 2022-06-18 DIAGNOSIS — R000 Tachycardia, unspecified: Secondary | ICD-10-CM | POA: Diagnosis not present

## 2022-06-18 DIAGNOSIS — Z79899 Other long term (current) drug therapy: Secondary | ICD-10-CM | POA: Insufficient documentation

## 2022-06-18 DIAGNOSIS — I959 Hypotension, unspecified: Secondary | ICD-10-CM | POA: Diagnosis not present

## 2022-06-18 LAB — COMPREHENSIVE METABOLIC PANEL
ALT: 45 U/L — ABNORMAL HIGH (ref 0–44)
AST: 79 U/L — ABNORMAL HIGH (ref 15–41)
Albumin: 3.4 g/dL — ABNORMAL LOW (ref 3.5–5.0)
Alkaline Phosphatase: 55 U/L (ref 38–126)
Anion gap: 14 (ref 5–15)
BUN: 28 mg/dL — ABNORMAL HIGH (ref 8–23)
CO2: 17 mmol/L — ABNORMAL LOW (ref 22–32)
Calcium: 8.3 mg/dL — ABNORMAL LOW (ref 8.9–10.3)
Chloride: 107 mmol/L (ref 98–111)
Creatinine, Ser: 2.93 mg/dL — ABNORMAL HIGH (ref 0.44–1.00)
GFR, Estimated: 14 mL/min — ABNORMAL LOW (ref >60)
Glucose, Bld: 90 mg/dL (ref 70–99)
Potassium: 3.4 mmol/L — ABNORMAL LOW (ref 3.5–5.1)
Sodium: 138 mmol/L (ref 135–145)
Total Bilirubin: 0.8 mg/dL (ref 0.3–1.2)
Total Protein: 6.5 g/dL (ref 6.5–8.1)

## 2022-06-18 LAB — CBC WITH DIFFERENTIAL/PLATELET
Abs Immature Granulocytes: 0.05 10*3/uL (ref 0.00–0.07)
Basophils Absolute: 0 10*3/uL (ref 0.0–0.1)
Basophils Relative: 0 %
Eosinophils Absolute: 0 10*3/uL (ref 0.0–0.5)
Eosinophils Relative: 0 %
HCT: 35.1 % — ABNORMAL LOW (ref 36.0–46.0)
Hemoglobin: 11.6 g/dL — ABNORMAL LOW (ref 12.0–15.0)
Immature Granulocytes: 1 %
Lymphocytes Relative: 7 %
Lymphs Abs: 0.8 10*3/uL (ref 0.7–4.0)
MCH: 32.2 pg (ref 26.0–34.0)
MCHC: 33 g/dL (ref 30.0–36.0)
MCV: 97.5 fL (ref 80.0–100.0)
Monocytes Absolute: 0.7 10*3/uL (ref 0.1–1.0)
Monocytes Relative: 7 %
Neutro Abs: 8.6 10*3/uL — ABNORMAL HIGH (ref 1.7–7.7)
Neutrophils Relative %: 85 %
Platelets: 171 10*3/uL (ref 150–400)
RBC: 3.6 MIL/uL — ABNORMAL LOW (ref 3.87–5.11)
RDW: 12.9 % (ref 11.5–15.5)
WBC: 10.2 10*3/uL (ref 4.0–10.5)
nRBC: 0 % (ref 0.0–0.2)

## 2022-06-18 LAB — URINALYSIS, ROUTINE W REFLEX MICROSCOPIC
Bacteria, UA: NONE SEEN
Bilirubin Urine: NEGATIVE
Glucose, UA: 150 mg/dL — AB
Ketones, ur: NEGATIVE mg/dL
Leukocytes,Ua: NEGATIVE
Nitrite: NEGATIVE
Protein, ur: 100 mg/dL — AB
Specific Gravity, Urine: 1.013 (ref 1.005–1.030)
pH: 6 (ref 5.0–8.0)

## 2022-06-18 LAB — CBG MONITORING, ED
Glucose-Capillary: <10 mg/dL — CL (ref 70–99)
Glucose-Capillary: 118 mg/dL — ABNORMAL HIGH (ref 70–99)
Glucose-Capillary: 103 mg/dL — ABNORMAL HIGH (ref 70–99)

## 2022-06-18 MED ORDER — DEXTROSE 50 % IV SOLN: 1.0000 | Freq: Once | INTRAVENOUS | Status: AC

## 2022-06-18 MED ORDER — SODIUM CHLORIDE 0.9 % IV BOLUS
500.0000 mL | Freq: Once | INTRAVENOUS | Status: AC
  Administered 2022-06-18: 500 mL via INTRAVENOUS

## 2022-06-18 MED ORDER — DEXTROSE 50 % IV SOLN
INTRAVENOUS | Status: AC
  Administered 2022-06-18: 50 mL via INTRAVENOUS
  Filled 2022-06-18: qty 50

## 2022-06-18 NOTE — Discharge Instructions (Signed)
Make sure you are drinking plenty of fluids and not missing meals.  Adding an Ensure drink if you do not have an appetite for your food will help with your nutrition.

## 2022-06-18 NOTE — ED Notes (Signed)
Pt had pulled out IV and telemetry leads off.

## 2022-06-18 NOTE — ED Notes (Signed)
Pt provided with [place] sandwich, applesauce, and graham crackers with regular soda.

## 2022-06-18 NOTE — ED Triage Notes (Signed)
Pt BIB [facility] for syncopal episodes, reported pt had 3 today and pt with hx of same due to dehydration. IV started by EMS 24 G to left wrist and 500 cc of LR given en route.  Reported pt alert but confused to time and place.

## 2022-06-18 NOTE — ED Provider Notes (Signed)
[place] EMERGENCY DEPARTMENT AT [facility] Provider Note   CSN: [id number] Arrival date & time: 06/18/22  1324     History  No chief complaint on file.   [name] is a 89 y.o. female with a history including hypertension, GERD, pernicious anemia hyperlipidemia history of carcinoid tumor of the stomach and mild memory loss presenting for evaluation of syncope.  She was at her caregivers home sitting in a chair when she simply slumped over and was unresponsive for a brief time.  She has no complaint of symptoms at this time including denies headache, chest pain, shortness of breath or abdominal pain.  Son at the bedside states that with prior similar episodes she was either found to be dehydrated or had a UTI.  She does have episodes of urinary and fecal incontinence at baseline, she has noted to have had urinary incontinence upon arrival here.  She is currently symptom-free.  She lives with her son in his home but has a day caregiver.  Son states that she is not very willing to stay hydrated, he offers her water but she prefers soda and coffee.  He has started giving her cranberry juice which she does tolerate.  The history is provided by the patient and a relative (son at bedside).       Home Medications Prior to Admission medications   Medication Sig Start Date End Date Taking? Authorizing Provider  acetaminophen (TYLENOL) 500 MG tablet Take 500 mg by mouth every 6 (six) hours as needed for mild pain. For pain   Yes [provider]  amLODipine (NORVASC) 10 MG tablet Take 1 tablet (10 mg total) by mouth daily. 04/29/22  Yes Anabel Halon, MD  aspirin EC 81 MG tablet Take 1 tablet (81 mg total) by mouth daily with breakfast. 06/22/21 06/22/22 Yes Emokpae, Courage, MD  clopidogrel (PLAVIX) 75 MG tablet Take 1 tablet (75 mg total) by mouth daily. For 3 months with 81 mg aspirin 04/29/22 07/28/22 Yes Patel, Earlie Lou, MD  feeding supplement (ENSURE ENLIVE / ENSURE PLUS)  LIQD Take 237 mLs by mouth 2 (two) times daily between meals. 03/01/22  Yes Shon Hale, MD  levothyroxine (SYNTHROID) 25 MCG tablet Take 1 tablet (25 mcg total) by mouth daily before breakfast. 05/27/22  Yes Anabel Halon, MD  megestrol (MEGACE) 400 MG/10ML suspension Take 10 mLs (400 mg total) by mouth daily. 03/02/22  Yes Shon Hale, MD  Multiple Vitamin (MULTIVITAMIN WITH MINERALS) TABS tablet Take 1 tablet by mouth daily. 03/02/22  Yes Emokpae, Courage, MD  pantoprazole (PROTONIX) 40 MG tablet TAKE ONE TABLET BY MOUTH ONCE DAILY. 05/13/22  Yes Anabel Halon, MD  rosuvastatin (CRESTOR) 40 MG tablet Take 40 mg by mouth daily. 03/24/22  Yes [provider]  valsartan (DIOVAN) 40 MG tablet Take 40 mg by mouth daily. 03/04/22  Yes [provider]      Allergies    Codeine    Review of Systems   Review of Systems  Constitutional:  Negative for chills and fever.  HENT:  Negative for congestion and sore throat.   Eyes: Negative.   Respiratory:  Negative for chest tightness and shortness of breath.   Cardiovascular:  Negative for chest pain, palpitations and leg swelling.  Gastrointestinal:  Negative for abdominal pain, nausea and vomiting.  Genitourinary: Negative.   Musculoskeletal:  Negative for arthralgias, joint swelling and neck pain.  Skin: Negative.  Negative for rash and wound.  Neurological:  Positive  for syncope. Negative for dizziness, weakness, light-headedness, numbness and headaches.  Psychiatric/Behavioral: Negative.    All other systems reviewed and are negative.   Physical Exam Updated Vital Signs BP (!) 150/65   Pulse 90   Temp 98.5 F (36.9 C) (Oral)   Resp 19   SpO2 100%  Physical Exam Vitals and nursing note reviewed.  Constitutional:      Appearance: She is well-developed and underweight.     Comments: Pleasantly confused, son at bedside endorses this is her baseline.  HENT:     Head: Normocephalic and atraumatic.  Eyes:      Conjunctiva/sclera: Conjunctivae normal.  Cardiovascular:     Rate and Rhythm: Normal rate and regular rhythm.     Heart sounds: Normal heart sounds.  Pulmonary:     Effort: Pulmonary effort is normal.     Breath sounds: Normal breath sounds. No wheezing.  Abdominal:     General: Bowel sounds are normal.     Palpations: Abdomen is soft.     Tenderness: There is no abdominal tenderness. There is no guarding.  Musculoskeletal:        General: Normal range of motion.     Cervical back: Normal range of motion.     Right lower leg: No edema.     Left lower leg: No edema.  Skin:    General: Skin is warm and dry.  Neurological:     Mental Status: She is alert.     ED Results / Procedures / Treatments   Labs (all labs ordered are listed, but only abnormal results are displayed) Labs Reviewed  CBC WITH DIFFERENTIAL/PLATELET - Abnormal; Notable for the following components:      Result Value   RBC 3.60 (*)    Hemoglobin 11.6 (*)    HCT 35.1 (*)    Neutro Abs 8.6 (*)    All other components within normal limits  COMPREHENSIVE METABOLIC PANEL - Abnormal; Notable for the following components:   Potassium 3.4 (*)    CO2 17 (*)    BUN 28 (*)    Creatinine, Ser 2.93 (*)    Calcium 8.3 (*)    Albumin 3.4 (*)    AST 79 (*)    ALT 45 (*)    GFR, Estimated 14 (*)    All other components within normal limits  URINALYSIS, ROUTINE W REFLEX MICROSCOPIC - Abnormal; Notable for the following components:   Glucose, UA 150 (*)    Hgb urine dipstick LARGE (*)    Protein, ur 100 (*)    All other components within normal limits  CBG MONITORING, ED - Abnormal; Notable for the following components:   Glucose-Capillary <10 (*)    All other components within normal limits  CBG MONITORING, ED - Abnormal; Notable for the following components:   Glucose-Capillary 118 (*)    All other components within normal limits  CBG MONITORING, ED - Abnormal; Notable for the following components:    Glucose-Capillary 103 (*)    All other components within normal limits    EKG EKG Interpretation  Date/Time:  Tuesday Jun 18 2022 15:18:55 EDT Ventricular Rate:  101 PR Interval:  48 QRS Duration: 291 QT Interval:  483 QTC Calculation: 627 R Axis:   71 Text Interpretation: Sinus tachycardia Right atrial enlargement Nonspecific intraventricular conduction delay Probable anteroseptal infarct, old increased rate from prior 1/24 Confirmed by Meridee Score [phone]) on 06/18/2022 3:36:46 PM  Radiology DG Chest Portable 1 View  Result Date: 06/18/2022  CLINICAL DATA:  Syncope, confusion. EXAM: PORTABLE CHEST 1 VIEW COMPARISON:  Chest x-ray dated 02/24/2022. FINDINGS: Heart size and mediastinal contours are stable. Coarse lung markings bilaterally. No confluent opacity to suggest a developing pneumonia. No pleural effusion or pneumothorax is seen. Large hiatal hernia. Osseous structures about the chest are unremarkable. IMPRESSION: 1. No active disease. No evidence of pneumonia or pulmonary edema. 2. Large hiatal hernia. Electronically Signed   By: Bary Richard M.D.   On: 06/18/2022 14:43    Procedures Procedures    Medications Ordered in ED Medications  sodium chloride 0.9 % bolus 500 mL (0 mLs Intravenous Stopped 06/18/22 1741)  dextrose 50 % solution 50 mL (50 mLs Intravenous Given 06/18/22 1518)    ED Course/ Medical Decision Making/ A&P                             Medical Decision Making Patient for me with an episode of syncope this morning while sitting in her caregivers home.  It was described as brief, she spontaneously woke.  She denies any symptoms before or since this episode.  Son at bedside states she has had prior episodes of similar events, she was admitted to the hospital for the same concern in February and was found to be dehydrated.  With another episode she was admitted done at [facility] and had a cardiac workup, also wore a Holter monitor which did not reveal any  arrhythmias.  She is at her baseline currently.  Differential diagnoses including dehydration, arrhythmia, vasovagal event.  Lab work imaging and EKG per documentation below.  She did have 1 outlier CBG recorded as 10, however I do not believe this is accurate, patient did not have any confusion, LOC or abnormal behavior around the time of this event.  That being said she was given an amp of D50 and also ate a meal and repeat CBGs have been normal range.  Amount and/or Complexity of Data Reviewed Labs: ordered.    Details: Her last CBG prior to discharge was 103, she has a large hemoglobin in her urine but no RBCs.  No bacteria and no WBCs.  Her c-Met is fairly stable, borderline hypokalemia with a potassium of 3.4.  She has a BUN of 28 and a creatinine of 2.93 which is a stable creatinine for this patient.  She also has a slight elevation in her LFTs of unclear etiology, she has an AST of 79 and ALT of 45.  She has no complaints of abdominal pain and on serial exams her exam is nontender, particularly right upper quadrant, normal WBC at 10.2. Radiology: ordered and independent interpretation performed.    Details: Chest x-ray reviewed, agree with interpretation.  Large hiatal hernia, no acute findings. ECG/medicine tests: ordered.    Details: EKG reviewed, vent rate 101 upon first arrival, sinus tachycardia, IVCD no acute findings.  Of note her pulse during the remainder of her visit remained around the 90 rate.  Risk Decision regarding hospitalization. Risk Details: Discussed overnight observation versus discharge home given patient's history of syncope.  She appears to be at her baseline, discussed with her and her son, we agree that she is stable for discharge home, advised close follow-up with her primary provider, returning here for any new or worsening symptoms.           Final Clinical Impression(s) / ED Diagnoses Final diagnoses:  Syncope, unspecified syncope type    Rx / DC  Orders ED Discharge Orders     None         Victoriano Lain 06/18/22 1851    Gloris Manchester, MD 06/19/22 [phone]

## 2022-06-24 ENCOUNTER — Telehealth: Payer: Self-pay | Admitting: *Deleted

## 2022-06-24 NOTE — Telephone Encounter (Signed)
Transition Care Management Follow-up Telephone Call Date of discharge and from where: [name]  How have you been since you were released from the hospital? Still tired  Any questions or concerns? No  Items Reviewed: Did the pt receive and understand the discharge instructions provided? Yes  Medications obtained and verified? Yes  Other? No  Any new allergies since your discharge? No  Dietary orders reviewed? No Do you have support at home? Yes      Follow up appointments reviewed:  PCP Hospital f/u appt confirmed? Yes  Scheduled to see pcp this week  Are transportation arrangements needed? No  If their condition worsens, is the pt aware to call PCP or go to the Emergency Dept.? Yes Was the patient provided with contact information for the PCP's office or ED? Yes Was to pt encouraged to call back with questions or concerns? Yes

## 2022-06-26 ENCOUNTER — Encounter: Payer: Self-pay | Admitting: Internal Medicine

## 2022-06-26 ENCOUNTER — Ambulatory Visit (INDEPENDENT_AMBULATORY_CARE_PROVIDER_SITE_OTHER): Admitting: Internal Medicine

## 2022-06-26 VITALS — BP 115/64 | HR 79 | Ht 61.0 in | Wt 99.2 lb

## 2022-06-26 DIAGNOSIS — F02A0 Dementia in other diseases classified elsewhere, mild, without behavioral disturbance, psychotic disturbance, mood disturbance, and anxiety: Secondary | ICD-10-CM | POA: Diagnosis not present

## 2022-06-26 DIAGNOSIS — R55 Syncope and collapse: Secondary | ICD-10-CM

## 2022-06-26 DIAGNOSIS — I10 Essential (primary) hypertension: Secondary | ICD-10-CM

## 2022-06-26 DIAGNOSIS — N179 Acute kidney failure, unspecified: Secondary | ICD-10-CM

## 2022-06-26 DIAGNOSIS — E039 Hypothyroidism, unspecified: Secondary | ICD-10-CM | POA: Diagnosis not present

## 2022-06-26 DIAGNOSIS — R627 Adult failure to thrive: Secondary | ICD-10-CM

## 2022-06-26 DIAGNOSIS — G301 Alzheimer's disease with late onset: Secondary | ICD-10-CM | POA: Diagnosis not present

## 2022-06-26 DIAGNOSIS — I6523 Occlusion and stenosis of bilateral carotid arteries: Secondary | ICD-10-CM

## 2022-06-26 NOTE — Assessment & Plan Note (Signed)
GFR has decreased from 40s to ~15 Likely due to poor p.o. hydration Needs to improve fluid intake Decreased dose of valsartan to 20 mg QD Check BMP

## 2022-06-26 NOTE — Assessment & Plan Note (Signed)
Has poor p.o. intake of fluids and skips meals at times, but has been eating better lately On Megestol now Continue protein supplement

## 2022-06-26 NOTE — Assessment & Plan Note (Signed)
Was on Donepezil 5 mg QD Has had slight worsening in her cognition recently - MMSE: 23/30 Currently under home hospice care

## 2022-06-26 NOTE — Patient Instructions (Signed)
Please take Valsartan half-tablet once daily instead of full tablet.  Please continue to take other medications as prescribed.  Please continue to take at least 50 ounces of fluid in a day.

## 2022-06-26 NOTE — Assessment & Plan Note (Signed)
BP Readings from Last 1 Encounters:  06/26/22 115/64   Usually well-controlled with Amlodipine and Valsartan Considering tightly controlled blood pressure and AKI, advised to decrease dose of valsartan to 20 mg QD Counseled for compliance with the medications Advised DASH diet and moderate exercise/walking as tolerated

## 2022-06-26 NOTE — Assessment & Plan Note (Signed)
Unclear etiology Could be due to dehydration and/or carotid artery stenosis  Advised to maintain adequate hydration On aspirin, Plavix and statin for carotid artery stenosis Had referred to neurology 

## 2022-06-26 NOTE — Assessment & Plan Note (Signed)
Carotid artery US showed - Severe (70-99%) stenosis proximal right internal carotid artery secondary to bulky heterogeneous atherosclerotic plaque. Plaque burden is largely in the carotid bifurcation and extending into the internal and external carotid arteries. On aspirin and statin Referred to neurology She is not deemed to be a good candidate for any aggressive intervention 

## 2022-06-26 NOTE — Progress Notes (Signed)
Established Patient Office Visit  Subjective:  Patient ID: [name], female    DOB: 1922/08/20  Age: 89 y.o. MRN: [id number]  CC:  Chief Complaint  Patient presents with   Hand Injury    Patient has hurt her left hand and wrist, swollen and painful   Hypertension    Follow up     HPI [name] is a 89 y.o. female with past medical history of HTN, benign neuroendocrine carcinoid tumor of stomach, iron deficiency anemia, vitamin B12 deficiency and malnutrition who presents for f/u of her chronic medical conditions.  HTN: Her BP is wnl today, but has been fluctuating as she forgets to take her medicines.  Her son has been helping her with her medications now. She denies any headache, chest pain, dyspnea or palpitations.  She takes levothyroxine for history of hypothyroidism.  Denies any recent change in weight or appetite.  She has chronic tremors.  She had an episode of syncope in the last week, that she slumped over to the side while she was sitting.  She had passed out for few seconds, went to ER and was found to have AKI on CKD.  She has had poor p.o. intake of fluids, but has been tolerating cranberry juice better lately.  She also reports left hand and wrist swelling, which is likely due to direct impact injury from the fall.  Denies any numbness or tingling of the hands.  She has had multiple episodes of syncope, likely due to carotid artery stenosis and/or dehydration.  She is not a surgical candidate for surgery for carotid artery stenosis.  Past Medical History:  Diagnosis Date   Adult BMI 19-24 kg/sq m 2008 125 lbs   Anemia    Pernicious 2008-HB 12 MCV 119.5; JAN 2012 HB 13.2 MCV 97.6   Carcinoid tumor of stomach 2008   Diverticulosis    GERD (gastroesophageal reflux disease)    Hiatal hernia    HTN (hypertension)    Hyperlipidemia    Hypertension    Phreesia 02/07/2020   Mild memory loss 02/15/2011   Near syncope 04/18/2014   Pernicious anemia     Pernicious anemia 06/14/2010   Wrist fracture, left 2008    Past Surgical History:  Procedure Laterality Date   COLONOSCOPY  2003 LS   DC/[place] Diverticulosis   EUS  2008 DJ   FNA NO CARCINOID   EUS  JAN 2009   NL EXAM, NO Bx   EUS  MAR 2011 WO   CARCINOID   UPPER GASTROINTESTINAL ENDOSCOPY  APR 2008 SLF   CARCIONOID   UPPER GASTROINTESTINAL ENDOSCOPY  FEB 2011 [facility]   CARCINOID/mild anemia/large hiatal hernia    Family History  Problem Relation Age of Onset   Cancer Mother    Heart disease Brother    Kidney disease Son    Colon cancer Neg Hx    Colon polyps Neg Hx     Social History   Socioeconomic History   Marital status: Widowed    Spouse name: Not on file   Number of children: 1   Years of education: 43   Highest education level: Not on file  Occupational History   Not on file  Tobacco Use   Smoking status: Never   Smokeless tobacco: Never  Vaping Use   Vaping Use: Never used  Substance and Sexual Activity   Alcohol use: No   Drug use: No   Sexual activity: Not Currently    Birth control/protection:  Post-menopausal  Other Topics Concern   Not on file  Social History Narrative   Live with son Ray   Social Determinants of Health   Financial Resource Strain: Low Risk  (03/14/2022)   Overall Financial Resource Strain (CARDIA)    Difficulty of Paying Living Expenses: Not hard at all  Food Insecurity: No Food Insecurity (03/14/2022)   Hunger Vital Sign    Worried About Running Out of Food in the Last Year: Never true    Ran Out of Food in the Last Year: Never true  Transportation Needs: No Transportation Needs (03/14/2022)   PRAPARE - Administrator, Civil Service (Medical): No    Lack of Transportation (Non-Medical): No  Physical Activity: Inactive (03/14/2022)   Exercise Vital Sign    Days of Exercise per Week: 0 days    Minutes of Exercise per Session: 0 min  Stress: No Stress Concern Present (03/14/2022)   [employer] of Occupational  Health - Occupational Stress Questionnaire    Feeling of Stress : Not at all  Social Connections: Moderately Integrated (03/14/2022)   Social Connection and Isolation Panel [NHANES]    Frequency of Communication with Friends and Family: Three times a week    Frequency of Social Gatherings with Friends and Family: More than three times a week    Attends Religious Services: More than 4 times per year    Active Member of [employer] or Organizations: Yes    Attends Banker Meetings: Never    Marital Status: Widowed  Intimate Partner Violence: Not At Risk (03/14/2022)   Humiliation, Afraid, Rape, and Kick questionnaire    Fear of Current or Ex-Partner: No    Emotionally Abused: No    Physically Abused: No    Sexually Abused: No    Outpatient Medications Prior to Visit  Medication Sig Dispense Refill   acetaminophen (TYLENOL) 500 MG tablet Take 500 mg by mouth every 6 (six) hours as needed for mild pain. For pain     amLODipine (NORVASC) 10 MG tablet Take 1 tablet (10 mg total) by mouth daily. 15 tablet 4   clopidogrel (PLAVIX) 75 MG tablet Take 1 tablet (75 mg total) by mouth daily. For 3 months with 81 mg aspirin 15 tablet 4   feeding supplement (ENSURE ENLIVE / ENSURE PLUS) LIQD Take 237 mLs by mouth 2 (two) times daily between meals. 237 mL 12   levothyroxine (SYNTHROID) 25 MCG tablet Take 1 tablet (25 mcg total) by mouth daily before breakfast. 90 tablet 0   megestrol (MEGACE) 400 MG/10ML suspension Take 10 mLs (400 mg total) by mouth daily. 480 mL 3   Multiple Vitamin (MULTIVITAMIN WITH MINERALS) TABS tablet Take 1 tablet by mouth daily. 100 tablet 2   pantoprazole (PROTONIX) 40 MG tablet TAKE ONE TABLET BY MOUTH ONCE DAILY. 30 tablet 0   rosuvastatin (CRESTOR) 40 MG tablet Take 40 mg by mouth daily.     valsartan (DIOVAN) 40 MG tablet Take 40 mg by mouth daily.     No facility-administered medications prior to visit.    Allergies  Allergen Reactions   Codeine      ROS Review of Systems  Constitutional:  Negative for chills and fever.  HENT:  Negative for congestion, sinus pressure, sinus pain and sore throat.   Eyes:  Negative for pain and discharge.  Respiratory:  Negative for cough and shortness of breath.   Cardiovascular:  Negative for chest pain and palpitations.  Gastrointestinal:  Negative for  abdominal pain, diarrhea, nausea and vomiting.  Endocrine: Negative for polydipsia and polyuria.  Genitourinary:  Negative for dysuria and hematuria.  Musculoskeletal:  Positive for back pain. Negative for neck pain and neck stiffness.       Left forearm pain  Skin:  Negative for rash.  Neurological:  Negative for dizziness, weakness and headaches.  Psychiatric/Behavioral:  Negative for agitation and behavioral problems.       Objective:    Physical Exam Vitals reviewed.  Constitutional:      General: She is not in acute distress.    Appearance: She is not diaphoretic.  HENT:     Head: Normocephalic and atraumatic.     Nose: Nose normal. No congestion.     Mouth/Throat:     Mouth: Mucous membranes are moist.     Pharynx: No posterior oropharyngeal erythema.  Eyes:     General: No scleral icterus.    Extraocular Movements: Extraocular movements intact.  Cardiovascular:     Rate and Rhythm: Normal rate and regular rhythm.     Pulses: Normal pulses.     Heart sounds: Normal heart sounds. No murmur heard. Pulmonary:     Breath sounds: Normal breath sounds. No wheezing or rales.  Musculoskeletal:     Cervical back: Neck supple. No tenderness.     Right lower leg: Edema (1+) present.     Left lower leg: Edema (1+) present.     Comments: Bulging over left forearm, about 2 cm in diameter -likely hematoma  Skin:    General: Skin is warm.     Findings: No rash.  Neurological:     General: No focal deficit present.     Mental Status: She is alert and oriented to person, place, and time.  Psychiatric:        Mood and Affect: Mood  normal.        Behavior: Behavior normal.     BP 115/64 (BP Location: Right Arm, Patient Position: Sitting, Cuff Size: Normal)   Pulse 79   Ht 5\' 1"  (1.549 m)   Wt 99 lb 3.2 oz (45 kg)   SpO2 90%   BMI 18.74 kg/m  Wt Readings from Last 3 Encounters:  06/26/22 99 lb 3.2 oz (45 kg)  02/24/22 89 lb 8.1 oz (40.6 kg)  02/07/22 96 lb (43.5 kg)    Lab Results  Component Value Date   TSH 1.466 02/24/2022   Lab Results  Component Value Date   WBC 10.2 06/18/2022   HGB 11.6 (L) 06/18/2022   HCT 35.1 (L) 06/18/2022   MCV 97.5 06/18/2022   PLT 171 06/18/2022   Lab Results  Component Value Date   NA 138 06/18/2022   K 3.4 (L) 06/18/2022   CO2 17 (L) 06/18/2022   GLUCOSE 90 06/18/2022   BUN 28 (H) 06/18/2022   CREATININE 2.93 (H) 06/18/2022   BILITOT 0.8 06/18/2022   ALKPHOS 55 06/18/2022   AST 79 (H) 06/18/2022   ALT 45 (H) 06/18/2022   PROT 6.5 06/18/2022   ALBUMIN 3.4 (L) 06/18/2022   CALCIUM 8.3 (L) 06/18/2022   ANIONGAP 14 06/18/2022   EGFR 28 (L) 02/07/2022   Lab Results  Component Value Date   CHOL 175 12/22/2021   Lab Results  Component Value Date   HDL 68 12/22/2021   Lab Results  Component Value Date   LDLCALC 98 12/22/2021   Lab Results  Component Value Date   TRIG 47 12/22/2021   Lab Results  Component  Value Date   CHOLHDL 2.6 12/22/2021   Lab Results  Component Value Date   HGBA1C 5.1 10/25/2021      Assessment & Plan:   Problem List Items Addressed This Visit       Cardiovascular and Mediastinum   Essential hypertension    BP Readings from Last 1 Encounters:  06/26/22 115/64  Usually well-controlled with Amlodipine and Valsartan Considering tightly controlled blood pressure and AKI, advised to decrease dose of valsartan to 20 mg QD Counseled for compliance with the medications Advised DASH diet and moderate exercise/walking as tolerated      Carotid disease, bilateral (HCC)    Carotid artery US showed - Severe (70-99%) stenosis  proximal right internal carotid artery secondary to bulky heterogeneous atherosclerotic plaque. Plaque burden is largely in the carotid bifurcation and extending into the internal and external carotid arteries. On aspirin and statin Referred to neurology She is not deemed to be a good candidate for any aggressive intervention        Endocrine   Acquired hypothyroidism    Lab Results  Component Value Date   TSH 1.466 02/24/2022  On Levothyroxine 25 mcg QD, needs to stay compliant Check TSH and free T4      Relevant Orders   TSH + free T4     Nervous and Auditory   Dementia (HCC)    Was on Donepezil 5 mg QD Has had slight worsening in her cognition recently - MMSE: 23/30 Currently under home hospice care        Genitourinary   AKI (acute kidney injury) (HCC)    GFR has decreased from 40s to ~15 Likely due to poor p.o. hydration Needs to improve fluid intake Decreased dose of valsartan to 20 mg QD Check BMP      Relevant Orders   Basic Metabolic Panel (BMET)   TSH + free T4     Other   Syncope and collapse - Primary    Unclear etiology Could be due to dehydration and/or carotid artery stenosis  Advised to maintain adequate hydration On aspirin, Plavix and statin for carotid artery stenosis Had referred to neurology      Failure to thrive in adult    Has poor p.o. intake of fluids and skips meals at times, but has been eating better lately On Megestol now Continue protein supplement      Relevant Orders   Basic Metabolic Panel (BMET)   No orders of the defined types were placed in this encounter.   Follow-up: Return in about 2 months (around 08/26/2022) for AKI and HTN.    Anabel Halon, MD

## 2022-06-26 NOTE — Assessment & Plan Note (Signed)
Lab Results  Component Value Date   TSH 1.466 02/24/2022   On Levothyroxine 25 mcg QD, needs to stay compliant Check TSH and free T4

## 2022-06-27 ENCOUNTER — Telehealth: Payer: Self-pay | Admitting: Internal Medicine

## 2022-06-27 ENCOUNTER — Other Ambulatory Visit: Payer: Self-pay

## 2022-06-27 DIAGNOSIS — N1831 Chronic kidney disease, stage 3a: Secondary | ICD-10-CM

## 2022-06-27 LAB — BASIC METABOLIC PANEL
BUN/Creatinine Ratio: 8 — ABNORMAL LOW (ref 12–28)
Calcium: 8.5 mg/dL — ABNORMAL LOW (ref 8.7–10.3)
Glucose: 154 mg/dL — ABNORMAL HIGH (ref 70–99)
Potassium: 3.5 mmol/L (ref 3.5–5.2)
Sodium: 145 mmol/L — ABNORMAL HIGH (ref 134–144)
eGFR: 15 mL/min/{1.73_m2} — ABNORMAL LOW (ref >59)

## 2022-06-27 NOTE — Telephone Encounter (Signed)
Clydie Braun called from hospice [phone] about patient. Sent teams message

## 2022-06-27 NOTE — Telephone Encounter (Signed)
Son advised. 

## 2022-07-03 ENCOUNTER — Ambulatory Visit: Payer: PPO | Attending: Internal Medicine | Admitting: Internal Medicine

## 2022-07-03 ENCOUNTER — Encounter: Payer: Self-pay | Admitting: Internal Medicine

## 2022-07-03 VITALS — BP 116/48 | HR 87 | Ht 60.0 in | Wt 102.0 lb

## 2022-07-03 DIAGNOSIS — I442 Atrioventricular block, complete: Secondary | ICD-10-CM | POA: Diagnosis not present

## 2022-07-03 DIAGNOSIS — R55 Syncope and collapse: Secondary | ICD-10-CM | POA: Diagnosis not present

## 2022-07-03 DIAGNOSIS — I5189 Other ill-defined heart diseases: Secondary | ICD-10-CM | POA: Diagnosis not present

## 2022-07-03 LAB — BASIC METABOLIC PANEL
BUN: 22 mg/dL (ref 10–36)
CO2: 22 mmol/L (ref 20–29)
Chloride: 109 mmol/L — ABNORMAL HIGH (ref 96–106)
Creatinine, Ser: 2.77 mg/dL — ABNORMAL HIGH (ref 0.57–1.00)

## 2022-07-03 LAB — TSH+FREE T4
Free T4: 1.6 ng/dL (ref 0.82–1.77)
TSH: 3.6 u[IU]/mL (ref 0.450–4.500)

## 2022-07-03 NOTE — Progress Notes (Signed)
Cardiology Office Note  Date: 07/03/2022   ID: [name], DOB [birth year], MRN [id number]  PCP:  Anabel Halon, MD  Cardiologist:  Reatha Harps, MD Electrophysiologist:  None   Reason for Office Visit: Follow-up of complete heart block   History of Present Illness: [name] is a 89 y.o. female known to have complete heart block intermittent, cardiac mass, HTN, CKD stage IIIb, neuroendocrine tumor of the stomach is here for follow-up visit.  Accompanied by son.  Patient is not much active at home. Does not do household chores. Only performs her ADLs. So far, she had 5 syncopal episodes in the last 1 year. She was seen by EP in 01/2022 for intermittent complete heart block evident on the monitor. Per son, EP did not recommend pacemaker due to advanced age.  EKG performed today in the clinic showed NSR, no evidence of AV block.  She denies any f angina, DOE, severe fatigue, lower extremity swelling, palpitations.  Son reported that she does not drink enough water and is trying his best to make her drink.  Past Medical History:  Diagnosis Date   Adult BMI 19-24 kg/sq m 2008 125 lbs   Anemia    Pernicious 2008-HB 12 MCV 119.5; JAN 2012 HB 13.2 MCV 97.6   Carcinoid tumor of stomach 2008   Diverticulosis    GERD (gastroesophageal reflux disease)    Hiatal hernia    HTN (hypertension)    Hyperlipidemia    Hypertension    Phreesia 02/07/2020   Mild memory loss 02/15/2011   Near syncope 04/18/2014   Pernicious anemia    Pernicious anemia 06/14/2010   Wrist fracture, left 2008    Past Surgical History:  Procedure Laterality Date   COLONOSCOPY  2003 LS   DC/[place] Diverticulosis   EUS  2008 DJ   FNA NO CARCINOID   EUS  JAN 2009   NL EXAM, NO Bx   EUS  MAR 2011 WO   CARCINOID   UPPER GASTROINTESTINAL ENDOSCOPY  APR 2008 SLF   CARCIONOID   UPPER GASTROINTESTINAL ENDOSCOPY  FEB 2011 [facility]   CARCINOID/mild anemia/large hiatal hernia    Current Outpatient Medications   Medication Sig Dispense Refill   acetaminophen (TYLENOL) 500 MG tablet Take 500 mg by mouth every 6 (six) hours as needed for mild pain. For pain     amLODipine (NORVASC) 10 MG tablet Take 1 tablet (10 mg total) by mouth daily. 15 tablet 4   feeding supplement (ENSURE ENLIVE / ENSURE PLUS) LIQD Take 237 mLs by mouth 2 (two) times daily between meals. 237 mL 12   levothyroxine (SYNTHROID) 25 MCG tablet Take 1 tablet (25 mcg total) by mouth daily before breakfast. 90 tablet 0   megestrol (MEGACE) 400 MG/10ML suspension Take 10 mLs (400 mg total) by mouth daily. 480 mL 3   Multiple Vitamin (MULTIVITAMIN WITH MINERALS) TABS tablet Take 1 tablet by mouth daily. 100 tablet 2   pantoprazole (PROTONIX) 40 MG tablet TAKE ONE TABLET BY MOUTH ONCE DAILY. 30 tablet 0   rosuvastatin (CRESTOR) 40 MG tablet Take 40 mg by mouth daily.     valsartan (DIOVAN) 40 MG tablet Take 40 mg by mouth daily. (Patient not taking: Reported on 07/03/2022)     No current facility-administered medications for this visit.   Allergies:  Codeine   Social History: The patient  reports that she has never smoked. She has never used smokeless tobacco. She reports that she does not drink  alcohol and does not use drugs.   Family History: The patient's family history includes Cancer in her mother; Heart disease in her brother; Kidney disease in her son.   ROS:  Please see the history of present illness. Otherwise, complete review of systems is positive for none.  All other systems are reviewed and negative.   Physical Exam: VS:  BP (!) 116/48   Pulse 87   Ht 5' (1.524 m)   Wt 102 lb (46.3 kg)   SpO2 97%   BMI 19.92 kg/m , BMI Body mass index is 19.92 kg/m.  Wt Readings from Last 3 Encounters:  07/03/22 102 lb (46.3 kg)  06/26/22 99 lb 3.2 oz (45 kg)  02/24/22 89 lb 8.1 oz (40.6 kg)    General: Patient appears comfortable at rest. HEENT: Conjunctiva and lids normal, oropharynx clear with moist mucosa. Neck: Supple, no  elevated JVP or carotid bruits, no thyromegaly. Lungs: Clear to auscultation, nonlabored breathing at rest. Cardiac: Regular rate and rhythm, no S3 or significant systolic murmur, no pericardial rub. Abdomen: Soft, nontender, no hepatomegaly, bowel sounds present, no guarding or rebound. Extremities: No pitting edema, distal pulses 2+. Skin: Warm and dry. Musculoskeletal: No kyphosis. Neuropsychiatric: Alert and oriented x3, affect grossly appropriate.  Recent Labwork: 02/26/2022: Magnesium 2.1 06/18/2022: ALT 45; AST 79; Hemoglobin 11.6; Platelets 171 06/26/2022: BUN 22; Creatinine, Ser 2.77; Potassium 3.5; Sodium 145; TSH 3.600     Component Value Date/Time   CHOL 175 12/22/2021 0338   TRIG 47 12/22/2021 0338   HDL 68 12/22/2021 0338   CHOLHDL 2.6 12/22/2021 0338   VLDL 9 12/22/2021 0338   LDLCALC 98 12/22/2021 0338    Other Studies Reviewed Today: Event monitor in 01/2022 Third-degree complete heart block, 4 episodes, longest 1 minute 12 seconds with ventricular escape rhythm in the 40s. PAC burden 2.2% Brief SVT, longest episode 16.7 seconds  Assessment and Plan: Patient is a 89 year old F known to have complete heart block intermittent, cardiac mass, HTN, CKD stage IIIb, neuroendocrine tumor of the stomach is here for follow-up visit.  Accompanied by son.  # Intermittent complete heart block -Event monitor from 01/2022 showed intermittent complete heart block, 4 episodes, longest 1 minute 12 seconds with ventricular escape rhythm in the 40s.  EKG during the clinic showed NSR, no ischemia, no evidence of AV block.  She had 5 syncopal episodes in the last 1 year. Follows up with EP, last seen in 01/2022 and per son, EP did not recommend pacemaker due to her advanced age.  # Cardiac mass -Echocardiogram in 2023 showed echodensity attached to the mid anterior/anterolateral wall and protruding into the LV cavity.  Cardiac MRI was deferred due to advanced age. No cardiac testing is  indicated at this time as this would not change management.  # Carotid artery stenosis (R ICA 50 to 69% in 11/2021) -Patient was started on aspirin and Plavix, DAPT therapy in 12/2021 with a plan to perform right carotid artery stenting but this plan was deferred for unclear reasons.  Will stop Plavix and continue aspirin 81 mg once daily.  Continue rosuvastatin 40 mg nightly, no myalgias.  # HLD, not at goal -Continue rosuvastatin 40 mg nightly, no myalgias.  LDL 98 around 6 months ago.  Goal LDL less than 70.  # HTN, controlled -Currently on amlodipine 10 mg once daily.  Has some ankle swelling.  His PCP discontinued valsartan due to progressive CKD.   I have spent a total of 30 minutes with  patient reviewing chart, EKGs, labs and examining patient as well as establishing an assessment and plan that was discussed with the patient.  > 50% of time was spent in direct patient care.    Medication Adjustments/Labs and Tests Ordered: Current medicines are reviewed at length with the patient today.  Concerns regarding medicines are outlined above.   Tests Ordered: Orders Placed This Encounter  Procedures   EKG 12-Lead    Medication Changes: No orders of the defined types were placed in this encounter.   Disposition:  Follow up  one year  Signed, Zhavia Cunanan Verne Spurr, MD, 07/03/2022 4:56 PM    [facility] [address], [place], [place] [address]

## 2022-07-03 NOTE — Patient Instructions (Addendum)
Medication Instructions:  Your physician has recommended you make the following change in your medication:  Stop Plavix   Labwork: None  Testing/Procedures: None  Follow-Up: Your physician recommends that you schedule a follow-up appointment in: 1 year. You will receive a reminder call in the mail in about 10 months reminding you to call and schedule your appointment. If you don't receive this call, please contact our office.   Any Other Special Instructions Will Be Listed Below (If Applicable).  If you need a refill on your cardiac medications before your next appointment, please call your pharmacy.

## 2022-07-10 DIAGNOSIS — D638 Anemia in other chronic diseases classified elsewhere: Secondary | ICD-10-CM | POA: Diagnosis not present

## 2022-07-10 DIAGNOSIS — I442 Atrioventricular block, complete: Secondary | ICD-10-CM | POA: Diagnosis not present

## 2022-07-10 DIAGNOSIS — E870 Hyperosmolality and hypernatremia: Secondary | ICD-10-CM | POA: Diagnosis not present

## 2022-07-10 DIAGNOSIS — C380 Malignant neoplasm of heart: Secondary | ICD-10-CM | POA: Diagnosis not present

## 2022-08-05 DIAGNOSIS — B351 Tinea unguium: Secondary | ICD-10-CM | POA: Diagnosis not present

## 2022-08-05 DIAGNOSIS — I739 Peripheral vascular disease, unspecified: Secondary | ICD-10-CM | POA: Diagnosis not present

## 2022-08-05 DIAGNOSIS — L600 Ingrowing nail: Secondary | ICD-10-CM | POA: Diagnosis not present

## 2022-08-05 DIAGNOSIS — M79675 Pain in left toe(s): Secondary | ICD-10-CM | POA: Diagnosis not present

## 2022-08-05 DIAGNOSIS — M79674 Pain in right toe(s): Secondary | ICD-10-CM | POA: Diagnosis not present

## 2022-08-08 DIAGNOSIS — D539 Nutritional anemia, unspecified: Secondary | ICD-10-CM | POA: Diagnosis not present

## 2022-08-08 DIAGNOSIS — D638 Anemia in other chronic diseases classified elsewhere: Secondary | ICD-10-CM | POA: Diagnosis not present

## 2022-08-08 DIAGNOSIS — N189 Chronic kidney disease, unspecified: Secondary | ICD-10-CM | POA: Diagnosis not present

## 2022-08-08 DIAGNOSIS — I5032 Chronic diastolic (congestive) heart failure: Secondary | ICD-10-CM | POA: Diagnosis not present

## 2022-08-08 DIAGNOSIS — D7589 Other specified diseases of blood and blood-forming organs: Secondary | ICD-10-CM | POA: Diagnosis not present

## 2022-08-08 DIAGNOSIS — Z7689 Persons encountering health services in other specified circumstances: Secondary | ICD-10-CM | POA: Diagnosis not present

## 2022-08-08 DIAGNOSIS — I129 Hypertensive chronic kidney disease with stage 1 through stage 4 chronic kidney disease, or unspecified chronic kidney disease: Secondary | ICD-10-CM | POA: Diagnosis not present

## 2022-08-08 DIAGNOSIS — E559 Vitamin D deficiency, unspecified: Secondary | ICD-10-CM | POA: Diagnosis not present

## 2022-08-08 DIAGNOSIS — D649 Anemia, unspecified: Secondary | ICD-10-CM | POA: Diagnosis not present

## 2022-08-08 DIAGNOSIS — N281 Cyst of kidney, acquired: Secondary | ICD-10-CM | POA: Diagnosis not present

## 2022-08-08 DIAGNOSIS — D3A8 Other benign neuroendocrine tumors: Secondary | ICD-10-CM | POA: Diagnosis not present

## 2022-08-14 DIAGNOSIS — E559 Vitamin D deficiency, unspecified: Secondary | ICD-10-CM | POA: Diagnosis not present

## 2022-08-14 DIAGNOSIS — I129 Hypertensive chronic kidney disease with stage 1 through stage 4 chronic kidney disease, or unspecified chronic kidney disease: Secondary | ICD-10-CM | POA: Diagnosis not present

## 2022-08-14 DIAGNOSIS — N179 Acute kidney failure, unspecified: Secondary | ICD-10-CM | POA: Diagnosis not present

## 2022-08-14 DIAGNOSIS — R809 Proteinuria, unspecified: Secondary | ICD-10-CM | POA: Diagnosis not present

## 2022-08-26 ENCOUNTER — Telehealth: Payer: Self-pay

## 2022-08-26 ENCOUNTER — Encounter: Payer: Self-pay | Admitting: Internal Medicine

## 2022-08-26 ENCOUNTER — Telehealth: Payer: Self-pay | Admitting: Internal Medicine

## 2022-08-26 ENCOUNTER — Ambulatory Visit (INDEPENDENT_AMBULATORY_CARE_PROVIDER_SITE_OTHER): Payer: PPO | Admitting: Internal Medicine

## 2022-08-26 VITALS — BP 108/60 | HR 61 | Ht 60.0 in | Wt 93.6 lb

## 2022-08-26 DIAGNOSIS — I10 Essential (primary) hypertension: Secondary | ICD-10-CM | POA: Diagnosis not present

## 2022-08-26 DIAGNOSIS — R0609 Other forms of dyspnea: Secondary | ICD-10-CM | POA: Insufficient documentation

## 2022-08-26 DIAGNOSIS — E039 Hypothyroidism, unspecified: Secondary | ICD-10-CM | POA: Diagnosis not present

## 2022-08-26 DIAGNOSIS — D3A092 Benign carcinoid tumor of the stomach: Secondary | ICD-10-CM | POA: Diagnosis not present

## 2022-08-26 DIAGNOSIS — I442 Atrioventricular block, complete: Secondary | ICD-10-CM

## 2022-08-26 DIAGNOSIS — I6523 Occlusion and stenosis of bilateral carotid arteries: Secondary | ICD-10-CM

## 2022-08-26 DIAGNOSIS — I5189 Other ill-defined heart diseases: Secondary | ICD-10-CM

## 2022-08-26 DIAGNOSIS — N179 Acute kidney failure, unspecified: Secondary | ICD-10-CM | POA: Diagnosis not present

## 2022-08-26 MED ORDER — LEVOTHYROXINE SODIUM 25 MCG PO TABS
25.0000 ug | ORAL_TABLET | Freq: Every day | ORAL | 1 refills | Status: DC
Start: 2022-08-26 — End: 2022-10-29

## 2022-08-26 MED ORDER — AMLODIPINE BESYLATE 5 MG PO TABS
5.0000 mg | ORAL_TABLET | Freq: Every day | ORAL | 3 refills | Status: DC
Start: 2022-08-26 — End: 2022-10-29

## 2022-08-26 NOTE — Assessment & Plan Note (Signed)
Likely multifactorial, due to failure to thrive/dehydration, HFpEF, intermittent complete heart block Needs to maintain adequate hydration and eat at regular intervals Under hospice care, would avoid excessive investigations

## 2022-08-26 NOTE — Assessment & Plan Note (Signed)
Lab Results  Component Value Date   TSH 3.600 06/26/2022   On Levothyroxine 25 mcg QD, needs to stay compliant Check TSH and free T4

## 2022-08-26 NOTE — Assessment & Plan Note (Addendum)
BP Readings from Last 1 Encounters:  08/26/22 108/60   Usually well-controlled with Amlodipine Considering tightly controlled blood pressure, advised to decrease dose of amlodipine to 5 mg QD Counseled for compliance with the medications Advised DASH diet and moderate exercise/walking as tolerated

## 2022-08-26 NOTE — Telephone Encounter (Signed)
Patient seen Dr Allena Katz this morning, once returning from lunch son came in office to state patient passed out. I went to the lobby to speak to son, he said patient is coming to, slobbering from mouth and nose, will not drink water. I went and spoke to Dr Allena Katz and he advised for patient to go to the Emergency Room. I informed patient son on Dr Allena Katz recommendation. Patient son stated that since his mother was coming back around and that he doesn't live far from here he was going to take her home and if she got worse he will take her to the ER. I advised patient son again what Dr Allena Katz recommends and patient son stated he will let us know if he ends up changing his mind and taking her to the ER.

## 2022-08-26 NOTE — Patient Instructions (Signed)
Please take Amlodipine 5 mg instead of 10 mg.  Please continue to take other medications as prescribed.  Please continue to follow low salt diet and ambulate as tolerated.

## 2022-08-26 NOTE — Assessment & Plan Note (Addendum)
GFR has decreased from 40s to ~15 Likely due to poor p.o. hydration Needs to improve fluid intake Discontinued valsartan to 20 mg ONCE DAILY Recently had nephrology evaluation, S. Cr. and GFR improved after stopping Valsartan Check BMP as per Nephrology recommendation

## 2022-08-26 NOTE — Assessment & Plan Note (Addendum)
Carotid artery US showed - Severe (70-99%) stenosis proximal right internal carotid artery secondary to bulky heterogeneous atherosclerotic plaque. Plaque burden is largely in the carotid bifurcation and extending into the internal and external carotid arteries. On aspirin and statin Has been referred to neurology She is not deemed to be a good candidate for any aggressive intervention

## 2022-08-26 NOTE — Telephone Encounter (Signed)
Patient seen Dr Allena Katz this morning, they left office and ran couple of errands and patient passed out, now the patient has came back to, concern needs to speak to nurse could this be dehydration?

## 2022-08-26 NOTE — Telephone Encounter (Signed)
Spoke to son

## 2022-08-26 NOTE — Assessment & Plan Note (Signed)
Has had multiple syncope Has had EP cardiology evaluation, did not get pacemaker considering her age Under hospice care now

## 2022-08-26 NOTE — Assessment & Plan Note (Signed)
Benign, rising chromogranin Serum serotonin normal CT abdomen recently did not show any new gastric mass.

## 2022-08-26 NOTE — Assessment & Plan Note (Signed)
Noted on echocardiogram Decided not to opt for cardiac MRI due to her age

## 2022-08-26 NOTE — Progress Notes (Addendum)
Established Patient Office Visit  Subjective:  Patient ID: [name], female    DOB: 08-18-1922  Age: 89 y.o. MRN: [id number]  CC:  Chief Complaint  Patient presents with  . Hypertension    Two month follow up   . Difficulty Walking    Patient is having trouble walking beyond 20-30yards    HPI [name] is a 89 y.o. female with past medical history of HTN, benign neuroendocrine carcinoid tumor of stomach, iron deficiency anemia, vitamin B12 deficiency and malnutrition who presents for f/u of her chronic medical conditions.  HTN: Her BP remains low normal now despite discontinuing valsartan recently.  She is taking amlodipine 10 mg QD currently.  She denies any headache, chest pain, or palpitations.  She has had episodes of presyncope, likely due to intermittent complete heart block and/or bilateral carotid artery stenosis.  She takes levothyroxine for history of hypothyroidism.  Denies any recent change in weight or appetite.  She has chronic tremors.  Her son reports that she has been getting increasingly fatigued with walking short distances and is sitting in the wheelchair today.  She has mild dyspnea upon exertion, but denies any chest pain or palpitations.  Past Medical History:  Diagnosis Date  . Adult BMI 19-24 kg/sq m 2008 125 lbs  . Anemia    Pernicious 2008-HB 12 MCV 119.5; JAN 2012 HB 13.2 MCV 97.6  . Carcinoid tumor of stomach 2008  . Diverticulosis   . GERD (gastroesophageal reflux disease)   . Hiatal hernia   . HTN (hypertension)   . Hyperlipidemia   . Hypertension    Phreesia 02/07/2020  . 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 Near syncope 04/18/2014  . Pernicious anemia   . Pernicious anemia 06/14/2010  . Wrist fracture, left 2008    Past Surgical History:  Procedure Laterality Date  . COLONOSCOPY  2003 LS   DC/Venturia Diverticulosis  . EUS  2008 DJ   FNA NO CARCINOID  . EUS  JAN 2009   NL EXAM, NO Bx  . EUS  MAR 2011 WO   CARCINOID  . UPPER  GASTROINTESTINAL ENDOSCOPY  APR 2008 [facility]   CARCIONOID  . UPPER GASTROINTESTINAL ENDOSCOPY  FEB 2011 [facility]   CARCINOID/mild anemia/large hiatal hernia    Family History  Problem Relation Age of Onset  . Cancer Mother   . Heart disease Brother   . Kidney disease Son   . Colon cancer Neg Hx   . Colon polyps Neg Hx     Social History   Socioeconomic History  . Marital status: Widowed    Spouse name: Not on file  . Number of children: 1  . Years of education: 23  . Highest education level: Not on file  Occupational History  . Not on file  Tobacco Use  . Smoking status: Never  . Smokeless tobacco: Never  Vaping Use  . Vaping status: Never Used  Substance and Sexual Activity  . Alcohol use: No  . Drug use: No  . Sexual activity: Not Currently    Birth control/protection: Post-menopausal  Other Topics Concern  . Not on file  Social History Narrative   Live with son [name]   Social Determinants of Health   Financial Resource Strain: Low Risk  (03/14/2022)   Overall Financial Resource Strain (CARDIA)   . Difficulty of Paying Living Expenses: Not hard at all  Food Insecurity: No Food Insecurity (03/14/2022)   Hunger Vital Sign   . Worried About  Running Out of Food in the Last Year: Never true   . Ran Out of Food in the Last Year: Never true  Transportation Needs: No Transportation Needs (03/14/2022)   PRAPARE - Transportation   . Lack of Transportation (Medical): No   . Lack of Transportation (Non-Medical): No  Physical Activity: Inactive (03/14/2022)   Exercise Vital Sign   . Days of Exercise per Week: 0 days   . Minutes of Exercise per Session: 0 min  Stress: No Stress Concern Present (03/14/2022)   [employer] of Occupational Health - Occupational Stress Questionnaire   . Feeling of Stress : Not at all  Social Connections: Moderately Integrated (03/14/2022)   Social Connection and Isolation Panel [NHANES]   . Frequency of Communication with Friends and Family: Three  times a week   . Frequency of Social Gatherings with Friends and Family: More than three times a week   . Attends Religious Services: More than 4 times per year   . Active Member of Clubs or Organizations: Yes   . Attends Banker Meetings: Never   . Marital Status: Widowed  Intimate Partner Violence: Not At Risk (03/14/2022)   Humiliation, Afraid, Rape, and Kick questionnaire   . Fear of Current or Ex-Partner: No   . Emotionally Abused: No   . Physically Abused: No   . Sexually Abused: No    Outpatient Medications Prior to Visit  Medication Sig Dispense Refill  . acetaminophen (TYLENOL) 500 MG tablet Take 500 mg by mouth every 6 (six) hours as needed for mild pain. For pain    . feeding supplement (ENSURE ENLIVE / ENSURE PLUS) LIQD Take 237 mLs by mouth 2 (two) times daily between meals. 237 mL 12  . megestrol (MEGACE) 400 MG/10ML suspension Take 10 mLs (400 mg total) by mouth daily. 480 mL 3  . Multiple Vitamin (MULTIVITAMIN WITH MINERALS) TABS tablet Take 1 tablet by mouth daily. 100 tablet 2  . pantoprazole (PROTONIX) 40 MG tablet TAKE ONE TABLET BY MOUTH ONCE DAILY. 30 tablet 0  . rosuvastatin (CRESTOR) 40 MG tablet Take 40 mg by mouth daily.    Marland Kitchen amLODipine (NORVASC) 10 MG tablet Take 1 tablet (10 mg total) by mouth daily. 15 tablet 4  . levothyroxine (SYNTHROID) 25 MCG tablet Take 1 tablet (25 mcg total) by mouth daily before breakfast. 90 tablet 0  . valsartan (DIOVAN) 40 MG tablet Take 40 mg by mouth daily. (Patient not taking: Reported on 07/03/2022)     No facility-administered medications prior to visit.    Allergies  Allergen Reactions  . Codeine     ROS Review of Systems  Constitutional:  Positive for fatigue. Negative for chills and fever.  HENT:  Negative for congestion, sinus pressure, sinus pain and sore throat.   Eyes:  Negative for pain and discharge.  Respiratory:  Positive for shortness of breath. Negative for cough.   Cardiovascular:  Negative  for chest pain and palpitations.  Gastrointestinal:  Negative for abdominal pain, diarrhea, nausea and vomiting.  Endocrine: Negative for polydipsia and polyuria.  Genitourinary:  Negative for dysuria and hematuria.  Musculoskeletal:  Positive for back pain. Negative for neck pain and neck stiffness.  Skin:  Negative for rash.  Neurological:  Negative for dizziness, weakness and headaches.  Psychiatric/Behavioral:  Negative for agitation and behavioral problems.       Objective:    Physical Exam Vitals reviewed.  Constitutional:      General: She is not in acute distress.  Appearance: She is not diaphoretic.  HENT:     Head: Normocephalic and atraumatic.     Nose: Nose normal. No congestion.     Mouth/Throat:     Mouth: Mucous membranes are moist.     Pharynx: No posterior oropharyngeal erythema.  Eyes:     General: No scleral icterus.    Extraocular Movements: Extraocular movements intact.  Cardiovascular:     Rate and Rhythm: Normal rate and regular rhythm.     Pulses: Normal pulses.     Heart sounds: Normal heart sounds. No murmur heard. Pulmonary:     Breath sounds: Normal breath sounds. No wheezing or rales.  Musculoskeletal:     Cervical back: Neck supple. No tenderness.     Right lower leg: Edema (1+) present.     Left lower leg: Edema (1+) present.  Skin:    General: Skin is warm.     Findings: No rash.  Neurological:     General: No focal deficit present.     Mental Status: She is alert and oriented to person, place, and time.     Sensory: No sensory deficit.     Motor: No weakness.  Psychiatric:        Mood and Affect: Mood normal.        Behavior: Behavior normal.    BP 108/60 (BP Location: Left Arm)   Pulse 61   Ht 5' (1.524 m)   Wt 93 lb 9.6 oz (42.5 kg)   SpO2 96%   BMI 18.28 kg/m  Wt Readings from Last 3 Encounters:  08/26/22 93 lb 9.6 oz (42.5 kg)  07/03/22 102 lb (46.3 kg)  06/26/22 99 lb 3.2 oz (45 kg)    Lab Results  Component Value  Date   TSH 3.600 06/26/2022   Lab Results  Component Value Date   WBC 10.2 06/18/2022   HGB 11.6 (L) 06/18/2022   HCT 35.1 (L) 06/18/2022   MCV 97.5 06/18/2022   PLT 171 06/18/2022   Lab Results  Component Value Date   NA 145 (H) 06/26/2022   K 3.5 06/26/2022   CO2 22 06/26/2022   GLUCOSE 154 (H) 06/26/2022   BUN 22 06/26/2022   CREATININE 2.77 (H) 06/26/2022   BILITOT 0.8 06/18/2022   ALKPHOS 55 06/18/2022   AST 79 (H) 06/18/2022   ALT 45 (H) 06/18/2022   PROT 6.5 06/18/2022   ALBUMIN 3.4 (L) 06/18/2022   CALCIUM 8.5 (L) 06/26/2022   ANIONGAP 14 06/18/2022   EGFR 15 (L) 06/26/2022   Lab Results  Component Value Date   CHOL 175 12/22/2021   Lab Results  Component Value Date   HDL 68 12/22/2021   Lab Results  Component Value Date   LDLCALC 98 12/22/2021   Lab Results  Component Value Date   TRIG 47 12/22/2021   Lab Results  Component Value Date   CHOLHDL 2.6 12/22/2021   Lab Results  Component Value Date   HGBA1C 5.1 10/25/2021      Assessment & Plan:   Problem List Items Addressed This Visit       Cardiovascular and Mediastinum   Essential hypertension (Chronic)    BP Readings from Last 1 Encounters:  08/26/22 108/60   Usually well-controlled with Amlodipine Considering tightly controlled blood pressure, advised to decrease dose of amlodipine to 5 mg QD Counseled for compliance with the medications Advised DASH diet and moderate exercise/walking as tolerated      Relevant Medications   amLODipine (NORVASC) 5  MG tablet   Carotid disease, bilateral (HCC) - Primary (Chronic)    Carotid artery US showed - Severe (70-99%) stenosis proximal right internal carotid artery secondary to bulky heterogeneous atherosclerotic plaque. Plaque burden is largely in the carotid bifurcation and extending into the internal and external carotid arteries. On aspirin and statin Has been referred to neurology She is not deemed to be a good candidate for any  aggressive intervention      Relevant Medications   amLODipine (NORVASC) 5 MG tablet   Heart block AV complete (HCC) (Chronic)    Has had multiple syncope Has had EP cardiology evaluation, did not get pacemaker considering her age Under hospice care now      Relevant Medications   amLODipine (NORVASC) 5 MG tablet   Cardiac mass (Chronic)    Noted on echocardiogram Decided not to opt for cardiac MRI due to her age      Relevant Medications   amLODipine (NORVASC) 5 MG tablet     Digestive   Neuroendocrine carcinoid tumor of stomach    Benign, rising chromogranin Serum serotonin normal CT abdomen recently did not show any new gastric mass.        Endocrine   Acquired hypothyroidism    Lab Results  Component Value Date   TSH 3.600 06/26/2022   On Levothyroxine 25 mcg QD, needs to stay compliant Check TSH and free T4      Relevant Medications   levothyroxine (SYNTHROID) 25 MCG tablet     Genitourinary   AKI (acute kidney injury) (HCC)    GFR has decreased from 40s to ~15 Likely due to poor p.o. hydration Needs to improve fluid intake Discontinued valsartan to 20 mg ONCE DAILY Recently had nephrology evaluation, S. Cr. and GFR improved after stopping Valsartan Check BMP as per Nephrology recommendation        Other   Exertional dyspnea    Likely multifactorial, due to failure to thrive/dehydration, HFpEF, intermittent complete heart block Needs to maintain adequate hydration and eat at regular intervals Under hospice care, would avoid excessive investigations       Meds ordered this encounter  Medications  . amLODipine (NORVASC) 5 MG tablet    Sig: Take 1 tablet (5 mg total) by mouth daily.    Dispense:  30 tablet    Refill:  3    PLEASE DISCONTINUE AMLODIPINE 10 MG PRESCRIPTION.  Marland Kitchen levothyroxine (SYNTHROID) 25 MCG tablet    Sig: Take 1 tablet (25 mcg total) by mouth daily before breakfast.    Dispense:  90 tablet    Refill:  1    Follow-up: Return  in about 4 months (around 12/26/2022) for HTN and hypothyroidism.    Anabel Halon, MD

## 2022-08-30 DIAGNOSIS — M79675 Pain in left toe(s): Secondary | ICD-10-CM | POA: Diagnosis not present

## 2022-08-30 DIAGNOSIS — L600 Ingrowing nail: Secondary | ICD-10-CM | POA: Diagnosis not present

## 2022-08-30 DIAGNOSIS — M79674 Pain in right toe(s): Secondary | ICD-10-CM | POA: Diagnosis not present

## 2022-10-04 ENCOUNTER — Encounter (HOSPITAL_COMMUNITY): Payer: Self-pay | Admitting: Hematology

## 2022-10-21 ENCOUNTER — Telehealth: Payer: Self-pay | Admitting: Internal Medicine

## 2022-10-21 ENCOUNTER — Other Ambulatory Visit: Payer: Self-pay

## 2022-10-21 DIAGNOSIS — Z23 Encounter for immunization: Secondary | ICD-10-CM

## 2022-10-21 MED ORDER — UNABLE TO FIND
1.0000 | Freq: Once | 0 refills | Status: AC
Start: 2022-10-21 — End: 2022-10-21

## 2022-10-21 NOTE — Telephone Encounter (Signed)
Rx sent to [facility] 

## 2022-10-21 NOTE — Telephone Encounter (Signed)
Clydie Braun with Hospice called in on patient behalf. Son is requesting patient have flu vaccine and Nurse will be with patient on Friday and can administer.  Needs flu vaccine orders   Call back Clydie Braun [phone].

## 2022-10-29 ENCOUNTER — Telehealth: Payer: Self-pay | Admitting: Internal Medicine

## 2022-10-29 NOTE — Telephone Encounter (Signed)
[facility] called in  Patient passed away today 8:14am

## 2022-11-01 ENCOUNTER — Telehealth: Payer: Self-pay | Admitting: Internal Medicine

## 2022-11-01 NOTE — Telephone Encounter (Signed)
Joyce Gross called from [facility] in [place] [phone]. Patient passed away 23-Nov-2022. Asking  put data system sign off on death certificate?

## 2022-11-04 NOTE — Telephone Encounter (Signed)
Called funeral home and reassign to Dr Allena Katz.

## 2022-11-06 NOTE — Telephone Encounter (Signed)
Evette Cristal called back from [facility] called waiting on sign off of death certificate. Case # [id number]. Her call back # [phone]

## 2022-11-22 DEATH — deceased

## 2022-12-26 ENCOUNTER — Ambulatory Visit: Payer: PPO | Admitting: Internal Medicine

## 2023-02-10 ENCOUNTER — Ambulatory Visit: Payer: PPO | Admitting: Internal Medicine
